# Patient Record
Sex: Female | Born: 1935
Health system: Southern US, Community
[De-identification: ages and names within clinical notes are randomized; demographics above are authoritative.]

## PROBLEM LIST (undated history)

## (undated) DIAGNOSIS — E079 Disorder of thyroid, unspecified: Secondary | ICD-10-CM

## (undated) DIAGNOSIS — I10 Essential (primary) hypertension: Secondary | ICD-10-CM

## (undated) DIAGNOSIS — I4891 Unspecified atrial fibrillation: Secondary | ICD-10-CM

## (undated) DIAGNOSIS — M1990 Unspecified osteoarthritis, unspecified site: Secondary | ICD-10-CM

## (undated) DIAGNOSIS — F0280 Dementia in other diseases classified elsewhere without behavioral disturbance: Secondary | ICD-10-CM

## (undated) DIAGNOSIS — G459 Transient cerebral ischemic attack, unspecified: Secondary | ICD-10-CM

## (undated) HISTORY — PX: EYE SURGERY: SHX253

## (undated) HISTORY — DX: Essential (primary) hypertension: I10

## (undated) HISTORY — DX: Transient cerebral ischemic attack, unspecified: G45.9

## (undated) HISTORY — DX: Disorder of thyroid, unspecified: E07.9

## (undated) HISTORY — DX: Unspecified osteoarthritis, unspecified site: M19.90

## (undated) HISTORY — DX: Unspecified atrial fibrillation: I48.91

---

## 1990-06-21 ENCOUNTER — Encounter: Payer: Self-pay | Admitting: Family Medicine

## 1990-06-27 ENCOUNTER — Encounter: Payer: Self-pay | Admitting: Family Medicine

## 1998-04-30 ENCOUNTER — Other Ambulatory Visit: Admission: RE | Admit: 1998-04-30 | Discharge: 1998-04-30 | Payer: Self-pay | Admitting: Obstetrics & Gynecology

## 1998-10-15 ENCOUNTER — Emergency Department (HOSPITAL_COMMUNITY): Admission: EM | Admit: 1998-10-15 | Discharge: 1998-10-15 | Payer: Self-pay | Admitting: Emergency Medicine

## 1999-04-27 ENCOUNTER — Other Ambulatory Visit: Admission: RE | Admit: 1999-04-27 | Discharge: 1999-04-27 | Payer: Self-pay | Admitting: Obstetrics & Gynecology

## 2000-06-08 ENCOUNTER — Other Ambulatory Visit: Admission: RE | Admit: 2000-06-08 | Discharge: 2000-06-08 | Payer: Self-pay | Admitting: Obstetrics & Gynecology

## 2001-04-25 ENCOUNTER — Encounter (INDEPENDENT_AMBULATORY_CARE_PROVIDER_SITE_OTHER): Payer: Self-pay | Admitting: Specialist

## 2001-04-25 ENCOUNTER — Other Ambulatory Visit: Admission: RE | Admit: 2001-04-25 | Discharge: 2001-04-25 | Payer: Self-pay | Admitting: Obstetrics & Gynecology

## 2001-07-18 ENCOUNTER — Other Ambulatory Visit: Admission: RE | Admit: 2001-07-18 | Discharge: 2001-07-18 | Payer: Self-pay | Admitting: Obstetrics & Gynecology

## 2002-09-04 ENCOUNTER — Encounter: Payer: Self-pay | Admitting: Family Medicine

## 2002-09-04 ENCOUNTER — Encounter: Admission: RE | Admit: 2002-09-04 | Discharge: 2002-09-04 | Payer: Self-pay | Admitting: Family Medicine

## 2002-10-02 ENCOUNTER — Other Ambulatory Visit: Admission: RE | Admit: 2002-10-02 | Discharge: 2002-10-02 | Payer: Self-pay | Admitting: Obstetrics & Gynecology

## 2003-10-30 ENCOUNTER — Other Ambulatory Visit: Admission: RE | Admit: 2003-10-30 | Discharge: 2003-10-30 | Payer: Self-pay | Admitting: Obstetrics & Gynecology

## 2004-10-20 ENCOUNTER — Ambulatory Visit: Payer: Self-pay | Admitting: Family Medicine

## 2004-11-22 ENCOUNTER — Other Ambulatory Visit: Admission: RE | Admit: 2004-11-22 | Discharge: 2004-11-22 | Payer: Self-pay | Admitting: Obstetrics & Gynecology

## 2004-12-06 ENCOUNTER — Ambulatory Visit: Payer: Self-pay | Admitting: Family Medicine

## 2005-10-24 ENCOUNTER — Ambulatory Visit: Payer: Self-pay | Admitting: Family Medicine

## 2005-10-31 ENCOUNTER — Encounter: Payer: Self-pay | Admitting: Internal Medicine

## 2005-10-31 ENCOUNTER — Ambulatory Visit: Payer: Self-pay

## 2005-11-03 ENCOUNTER — Ambulatory Visit: Payer: Self-pay | Admitting: Family Medicine

## 2005-11-08 ENCOUNTER — Ambulatory Visit: Payer: Self-pay | Admitting: Family Medicine

## 2006-04-03 ENCOUNTER — Encounter: Admission: RE | Admit: 2006-04-03 | Discharge: 2006-04-03 | Payer: Self-pay | Admitting: Family Medicine

## 2006-04-03 ENCOUNTER — Ambulatory Visit: Payer: Self-pay | Admitting: Family Medicine

## 2006-04-04 ENCOUNTER — Ambulatory Visit: Payer: Self-pay

## 2006-04-24 ENCOUNTER — Ambulatory Visit: Payer: Self-pay | Admitting: Family Medicine

## 2006-05-06 ENCOUNTER — Ambulatory Visit: Payer: Self-pay | Admitting: Family Medicine

## 2006-11-08 ENCOUNTER — Ambulatory Visit: Payer: Self-pay | Admitting: Family Medicine

## 2006-11-15 ENCOUNTER — Ambulatory Visit: Payer: Self-pay | Admitting: Family Medicine

## 2006-11-15 LAB — CONVERTED CEMR LAB
ALT: 13 units/L (ref 0–40)
AST: 20 units/L (ref 0–37)
Albumin: 3.6 g/dL (ref 3.5–5.2)
Alkaline Phosphatase: 76 units/L (ref 39–117)
BUN: 10 mg/dL (ref 6–23)
Bilirubin, Direct: 0.1 mg/dL (ref 0.0–0.3)
CO2: 26 meq/L (ref 19–32)
Calcium: 9 mg/dL (ref 8.4–10.5)
Chloride: 105 meq/L (ref 96–112)
Creatinine, Ser: 0.9 mg/dL (ref 0.4–1.2)
Free T4: 0.8 ng/dL (ref 0.6–1.6)
GFR calc Af Amer: 79 mL/min
GFR calc non Af Amer: 65 mL/min
Glucose, Bld: 97 mg/dL (ref 70–99)
Potassium: 4.1 meq/L (ref 3.5–5.1)
Sodium: 138 meq/L (ref 135–145)
T3, Free: 2.7 pg/mL (ref 2.3–4.2)
TSH: 0.83 microintl units/mL (ref 0.35–5.50)
Total Bilirubin: 0.6 mg/dL (ref 0.3–1.2)
Total Protein: 6.8 g/dL (ref 6.0–8.3)

## 2007-01-22 ENCOUNTER — Ambulatory Visit: Payer: Self-pay | Admitting: Gastroenterology

## 2007-01-30 DIAGNOSIS — E049 Nontoxic goiter, unspecified: Secondary | ICD-10-CM | POA: Insufficient documentation

## 2007-01-30 DIAGNOSIS — Z9089 Acquired absence of other organs: Secondary | ICD-10-CM | POA: Insufficient documentation

## 2007-01-30 DIAGNOSIS — N840 Polyp of corpus uteri: Secondary | ICD-10-CM | POA: Insufficient documentation

## 2007-01-30 DIAGNOSIS — I10 Essential (primary) hypertension: Secondary | ICD-10-CM | POA: Insufficient documentation

## 2007-01-31 ENCOUNTER — Encounter (INDEPENDENT_AMBULATORY_CARE_PROVIDER_SITE_OTHER): Payer: Self-pay | Admitting: Specialist

## 2007-01-31 ENCOUNTER — Ambulatory Visit: Payer: Self-pay | Admitting: Gastroenterology

## 2007-02-21 ENCOUNTER — Ambulatory Visit: Payer: Self-pay | Admitting: Family Medicine

## 2007-02-21 LAB — CONVERTED CEMR LAB
ALT: 15 units/L (ref 0–40)
AST: 20 units/L (ref 0–37)
Albumin: 3.6 g/dL (ref 3.5–5.2)
Alkaline Phosphatase: 86 units/L (ref 39–117)
BUN: 9 mg/dL (ref 6–23)
Basophils Absolute: 0 10*3/uL (ref 0.0–0.1)
Basophils Relative: 0.1 % (ref 0.0–1.0)
Bilirubin, Direct: 0.1 mg/dL (ref 0.0–0.3)
CO2: 29 meq/L (ref 19–32)
Calcium: 8.9 mg/dL (ref 8.4–10.5)
Chloride: 109 meq/L (ref 96–112)
Cholesterol: 170 mg/dL (ref 0–200)
Creatinine, Ser: 0.8 mg/dL (ref 0.4–1.2)
Eosinophils Absolute: 0.1 10*3/uL (ref 0.0–0.6)
Eosinophils Relative: 1.7 % (ref 0.0–5.0)
GFR calc Af Amer: 91 mL/min
GFR calc non Af Amer: 75 mL/min
Glucose, Bld: 96 mg/dL (ref 70–99)
HCT: 40.6 % (ref 36.0–46.0)
HDL: 45 mg/dL (ref >39.0)
Hemoglobin: 14.1 g/dL (ref 12.0–15.0)
LDL Cholesterol: 86 mg/dL (ref 0–99)
Lymphocytes Relative: 21.9 % (ref 12.0–46.0)
MCHC: 34.7 g/dL (ref 30.0–36.0)
MCV: 94.5 fL (ref 78.0–100.0)
Monocytes Absolute: 0.6 10*3/uL (ref 0.2–0.7)
Monocytes Relative: 10.1 % (ref 3.0–11.0)
Neutro Abs: 3.8 10*3/uL (ref 1.4–7.7)
Neutrophils Relative %: 66.2 % (ref 43.0–77.0)
Platelets: 249 10*3/uL (ref 150–400)
Potassium: 4.5 meq/L (ref 3.5–5.1)
RBC: 4.29 M/uL (ref 3.87–5.11)
RDW: 12.7 % (ref 11.5–14.6)
Sodium: 142 meq/L (ref 135–145)
TSH: 0.55 microintl units/mL (ref 0.35–5.50)
Total Bilirubin: 0.6 mg/dL (ref 0.3–1.2)
Total CHOL/HDL Ratio: 3.8
Total Protein: 6.4 g/dL (ref 6.0–8.3)
Triglycerides: 193 mg/dL — ABNORMAL HIGH (ref 0–149)
VLDL: 39 mg/dL (ref 0–40)
WBC: 5.8 10*3/uL (ref 4.5–10.5)

## 2007-12-14 ENCOUNTER — Ambulatory Visit: Payer: Self-pay | Admitting: Family Medicine

## 2007-12-14 DIAGNOSIS — E039 Hypothyroidism, unspecified: Secondary | ICD-10-CM | POA: Insufficient documentation

## 2007-12-14 DIAGNOSIS — I10 Essential (primary) hypertension: Secondary | ICD-10-CM | POA: Insufficient documentation

## 2007-12-14 DIAGNOSIS — Z8719 Personal history of other diseases of the digestive system: Secondary | ICD-10-CM | POA: Insufficient documentation

## 2008-01-01 ENCOUNTER — Ambulatory Visit: Payer: Self-pay | Admitting: Family Medicine

## 2008-01-01 LAB — CONVERTED CEMR LAB: OCCULT 3: NEGATIVE

## 2008-01-02 ENCOUNTER — Encounter (INDEPENDENT_AMBULATORY_CARE_PROVIDER_SITE_OTHER): Payer: Self-pay | Admitting: *Deleted

## 2008-03-31 ENCOUNTER — Ambulatory Visit: Payer: Self-pay | Admitting: Family Medicine

## 2008-03-31 ENCOUNTER — Encounter (INDEPENDENT_AMBULATORY_CARE_PROVIDER_SITE_OTHER): Payer: Self-pay | Admitting: *Deleted

## 2008-03-31 DIAGNOSIS — E874 Mixed disorder of acid-base balance: Secondary | ICD-10-CM | POA: Insufficient documentation

## 2008-03-31 LAB — CONVERTED CEMR LAB
ALT: 14 units/L (ref 0–35)
AST: 21 units/L (ref 0–37)
Bilirubin, Direct: 0.1 mg/dL (ref 0.0–0.3)
Cholesterol: 161 mg/dL (ref 0–200)
HDL: 43.9 mg/dL (ref >39.0)
Total Protein: 6.8 g/dL (ref 6.0–8.3)
VLDL: 22 mg/dL (ref 0–40)

## 2008-11-19 ENCOUNTER — Ambulatory Visit: Payer: Self-pay | Admitting: Family Medicine

## 2008-11-20 ENCOUNTER — Encounter: Payer: Self-pay | Admitting: Family Medicine

## 2008-11-26 ENCOUNTER — Encounter (INDEPENDENT_AMBULATORY_CARE_PROVIDER_SITE_OTHER): Payer: Self-pay | Admitting: *Deleted

## 2008-11-26 ENCOUNTER — Telehealth (INDEPENDENT_AMBULATORY_CARE_PROVIDER_SITE_OTHER): Payer: Self-pay | Admitting: *Deleted

## 2009-05-29 ENCOUNTER — Encounter: Payer: Self-pay | Admitting: Family Medicine

## 2009-07-20 ENCOUNTER — Ambulatory Visit: Payer: Self-pay | Admitting: Family Medicine

## 2009-07-20 DIAGNOSIS — R413 Other amnesia: Secondary | ICD-10-CM | POA: Insufficient documentation

## 2009-07-20 DIAGNOSIS — R55 Syncope and collapse: Secondary | ICD-10-CM | POA: Insufficient documentation

## 2009-07-20 DIAGNOSIS — R42 Dizziness and giddiness: Secondary | ICD-10-CM | POA: Insufficient documentation

## 2009-07-22 ENCOUNTER — Encounter: Payer: Self-pay | Admitting: Family Medicine

## 2009-07-22 ENCOUNTER — Encounter: Admission: RE | Admit: 2009-07-22 | Discharge: 2009-07-22 | Payer: Self-pay | Admitting: Emergency Medicine

## 2009-07-24 ENCOUNTER — Ambulatory Visit: Payer: Self-pay | Admitting: Family Medicine

## 2009-07-24 ENCOUNTER — Ambulatory Visit: Payer: Self-pay

## 2009-07-24 DIAGNOSIS — D599 Acquired hemolytic anemia, unspecified: Secondary | ICD-10-CM | POA: Insufficient documentation

## 2009-07-24 DIAGNOSIS — E538 Deficiency of other specified B group vitamins: Secondary | ICD-10-CM | POA: Insufficient documentation

## 2009-08-03 ENCOUNTER — Ambulatory Visit: Payer: Self-pay | Admitting: Family Medicine

## 2009-08-03 DIAGNOSIS — Z8679 Personal history of other diseases of the circulatory system: Secondary | ICD-10-CM | POA: Insufficient documentation

## 2009-08-06 ENCOUNTER — Telehealth: Payer: Self-pay | Admitting: Family Medicine

## 2009-08-10 ENCOUNTER — Ambulatory Visit: Payer: Self-pay | Admitting: Family Medicine

## 2009-08-17 ENCOUNTER — Encounter: Payer: Self-pay | Admitting: Family Medicine

## 2009-08-18 ENCOUNTER — Ambulatory Visit: Payer: Self-pay | Admitting: Family Medicine

## 2009-08-25 ENCOUNTER — Ambulatory Visit: Payer: Self-pay | Admitting: Family Medicine

## 2009-08-31 ENCOUNTER — Ambulatory Visit: Payer: Self-pay | Admitting: Family Medicine

## 2009-09-28 ENCOUNTER — Ambulatory Visit: Payer: Self-pay | Admitting: Family Medicine

## 2009-11-24 ENCOUNTER — Ambulatory Visit: Payer: Self-pay | Admitting: Family Medicine

## 2009-11-24 DIAGNOSIS — Z87448 Personal history of other diseases of urinary system: Secondary | ICD-10-CM | POA: Insufficient documentation

## 2009-11-24 LAB — CONVERTED CEMR LAB
Bilirubin Urine: NEGATIVE
Ketones, urine, test strip: NEGATIVE
Specific Gravity, Urine: 1.02
pH: 6.5

## 2009-12-28 ENCOUNTER — Ambulatory Visit: Payer: Self-pay | Admitting: Family Medicine

## 2010-01-21 ENCOUNTER — Ambulatory Visit: Payer: Self-pay | Admitting: Family Medicine

## 2010-02-12 ENCOUNTER — Encounter (INDEPENDENT_AMBULATORY_CARE_PROVIDER_SITE_OTHER): Payer: Self-pay | Admitting: *Deleted

## 2010-02-12 ENCOUNTER — Ambulatory Visit: Payer: Self-pay | Admitting: Gastroenterology

## 2010-02-12 DIAGNOSIS — R197 Diarrhea, unspecified: Secondary | ICD-10-CM | POA: Insufficient documentation

## 2010-02-12 LAB — CONVERTED CEMR LAB
ALT: 15 units/L (ref 0–35)
AST: 20 units/L (ref 0–37)
BUN: 8 mg/dL (ref 6–23)
Basophils Relative: 0.4 % (ref 0.0–3.0)
CO2: 30 meq/L (ref 19–32)
Chloride: 105 meq/L (ref 96–112)
Eosinophils Absolute: 0.1 10*3/uL (ref 0.0–0.7)
Eosinophils Relative: 1.4 % (ref 0.0–5.0)
Glucose, Bld: 100 mg/dL — ABNORMAL HIGH (ref 70–99)
Hemoglobin: 13.6 g/dL (ref 12.0–15.0)
Iron: 62 ug/dL (ref 42–145)
Lymphocytes Relative: 19.8 % (ref 12.0–46.0)
Monocytes Relative: 9.4 % (ref 3.0–12.0)
Neutrophils Relative %: 69 % (ref 43.0–77.0)
Potassium: 4.3 meq/L (ref 3.5–5.1)
RBC: 4.07 M/uL (ref 3.87–5.11)
Sed Rate: 16 mm/hr (ref 0–22)
TSH: 0.65 microintl units/mL (ref 0.35–5.50)
Total Bilirubin: 0.4 mg/dL (ref 0.3–1.2)
Total Protein: 6.6 g/dL (ref 6.0–8.3)
Transferrin: 242.2 mg/dL (ref 212.0–360.0)
WBC: 6.1 10*3/uL (ref 4.5–10.5)

## 2010-02-19 ENCOUNTER — Ambulatory Visit: Payer: Self-pay | Admitting: Gastroenterology

## 2010-02-22 LAB — CONVERTED CEMR LAB: Tissue Transglutaminase Ab, IgA: 2.2 units (ref <20)

## 2010-02-23 ENCOUNTER — Ambulatory Visit: Payer: Self-pay | Admitting: Family Medicine

## 2010-02-23 ENCOUNTER — Encounter: Payer: Self-pay | Admitting: Gastroenterology

## 2010-03-22 ENCOUNTER — Ambulatory Visit: Payer: Self-pay | Admitting: Family Medicine

## 2010-04-15 ENCOUNTER — Ambulatory Visit: Payer: Self-pay | Admitting: Family Medicine

## 2010-05-20 ENCOUNTER — Ambulatory Visit: Payer: Self-pay | Admitting: Family Medicine

## 2010-06-25 ENCOUNTER — Ambulatory Visit: Payer: Self-pay | Admitting: Family Medicine

## 2010-07-05 ENCOUNTER — Telehealth (INDEPENDENT_AMBULATORY_CARE_PROVIDER_SITE_OTHER): Payer: Self-pay | Admitting: *Deleted

## 2010-07-06 ENCOUNTER — Ambulatory Visit: Payer: Self-pay | Admitting: Family Medicine

## 2010-07-06 DIAGNOSIS — Z888 Allergy status to other drugs, medicaments and biological substances status: Secondary | ICD-10-CM | POA: Insufficient documentation

## 2010-07-06 DIAGNOSIS — R609 Edema, unspecified: Secondary | ICD-10-CM | POA: Insufficient documentation

## 2010-11-07 ENCOUNTER — Encounter: Payer: Self-pay | Admitting: Family Medicine

## 2010-11-14 LAB — CONVERTED CEMR LAB
ALT: 12 units/L (ref 0–35)
ALT: 16 units/L (ref 0–35)
AST: 20 units/L (ref 0–37)
AST: 23 units/L (ref 0–37)
Albumin: 3.6 g/dL (ref 3.5–5.2)
Albumin: 3.7 g/dL (ref 3.5–5.2)
Albumin: 3.9 g/dL (ref 3.5–5.2)
BUN: 13 mg/dL (ref 6–23)
BUN: 14 mg/dL (ref 6–23)
BUN: 8 mg/dL (ref 6–23)
Basophils Absolute: 0 10*3/uL (ref 0.0–0.1)
Basophils Absolute: 0 10*3/uL (ref 0.0–0.1)
Basophils Absolute: 0 10*3/uL (ref 0.0–0.1)
Basophils Relative: 0.7 % (ref 0.0–3.0)
Bilirubin Urine: NEGATIVE
Blood in Urine, dipstick: NEGATIVE
CO2: 28 meq/L (ref 19–32)
CO2: 28 meq/L (ref 19–32)
Calcium: 9 mg/dL (ref 8.4–10.5)
Calcium: 9.2 mg/dL (ref 8.4–10.5)
Calcium: 9.4 mg/dL (ref 8.4–10.5)
Chloride: 106 meq/L (ref 96–112)
Chloride: 108 meq/L (ref 96–112)
Cholesterol: 198 mg/dL (ref 0–200)
Creatinine, Ser: 0.9 mg/dL (ref 0.4–1.2)
Eosinophils Absolute: 0.1 10*3/uL (ref 0.0–0.6)
Eosinophils Absolute: 0.1 10*3/uL (ref 0.0–0.7)
Eosinophils Relative: 1.4 % (ref 0.0–5.0)
Eosinophils Relative: 1.5 % (ref 0.0–5.0)
GFR calc non Af Amer: 64.84 mL/min (ref >60)
GFR calc non Af Amer: 65 mL/min
Glucose, Bld: 86 mg/dL (ref 70–99)
Glucose, Bld: 90 mg/dL (ref 70–99)
Glucose, Urine, Semiquant: NEGATIVE
Glucose, Urine, Semiquant: NEGATIVE
HCT: 41.4 % (ref 36.0–46.0)
Hemoglobin: 13.9 g/dL (ref 12.0–15.0)
Hemoglobin: 14.4 g/dL (ref 12.0–15.0)
Ketones, urine, test strip: NEGATIVE
LDL Cholesterol: 114 mg/dL — ABNORMAL HIGH (ref 0–99)
Lymphocytes Relative: 19.1 % (ref 12.0–46.0)
Lymphocytes Relative: 33.1 % (ref 12.0–46.0)
Lymphs Abs: 1.8 10*3/uL (ref 0.7–4.0)
MCHC: 32.8 g/dL (ref 30.0–36.0)
MCHC: 33.6 g/dL (ref 30.0–36.0)
MCHC: 33.9 g/dL (ref 30.0–36.0)
MCV: 96.8 fL (ref 78.0–100.0)
Monocytes Relative: 11.8 % — ABNORMAL HIGH (ref 3.0–11.0)
Monocytes Relative: 3.7 % (ref 3.0–12.0)
Neutro Abs: 3.3 10*3/uL (ref 1.4–7.7)
Neutro Abs: 4 10*3/uL (ref 1.4–7.7)
Nitrite: NEGATIVE
Nitrite: POSITIVE
Pap Smear: NORMAL
Pap Smear: NORMAL
Platelets: 204 10*3/uL (ref 150.0–400.0)
Platelets: 274 10*3/uL (ref 150–400)
Protein, U semiquant: NEGATIVE
RBC: 4.36 M/uL (ref 3.87–5.11)
RBC: 4.38 M/uL (ref 3.87–5.11)
RDW: 12.3 % (ref 11.5–14.6)
Sodium: 141 meq/L (ref 135–145)
Specific Gravity, Urine: 1.015
Specific Gravity, Urine: 1.025
TSH: 0.63 microintl units/mL (ref 0.35–5.50)
TSH: 1.36 microintl units/mL (ref 0.35–5.50)
Total Bilirubin: 0.4 mg/dL (ref 0.3–1.2)
Total Bilirubin: 0.5 mg/dL (ref 0.3–1.2)
Total CHOL/HDL Ratio: 4.1
Total Protein: 6.6 g/dL (ref 6.0–8.3)
Triglycerides: 158 mg/dL — ABNORMAL HIGH (ref 0–149)
Urobilinogen, UA: NEGATIVE
WBC Urine, dipstick: NEGATIVE
WBC Urine, dipstick: NEGATIVE
WBC: 5.9 10*3/uL (ref 4.5–10.5)
WBC: 6 10*3/uL (ref 4.5–10.5)
pH: 5
pH: 5

## 2010-11-18 NOTE — Assessment & Plan Note (Signed)
Summary: ALLERGIC REACTION?CDJ   Vital Signs:  Patient profile:   77 year old female Height:      66 inches Weight:      164.8 pounds Temp:     98.0 degrees F oral Pulse rate:   80 / minute Pulse rhythm:   regular BP sitting:   132 / 74  (left arm) Cuff size:   regular  Vitals Entered By: Almeta Monas CMA Duncan Dull) (July 06, 2010 10:58 AM) CC: c/o itching after B-12 inj ? allergic reaction   History of Present Illness: Pt here c/o itching since last b12 shot.  No rash.   pt also c.o swelling R foot when she is on her feet a lot.  Current Medications (verified): 1)  Cozaar 100 Mg Tabs (Losartan Potassium) .Marland Kitchen.. 1 By Mouth Once Daily 2)  Synthroid 100 Mcg  Tabs (Levothyroxine Sodium) .... Take One Tablet Daily 3)  Aspirin 325 Mg Tabs (Aspirin) .... Take 1 Tablet By Mouth Once A Day 4)  Cyanocobalamin 1000 Mcg/ml Soln (Cyanocobalamin) .... Inject 1 Ml Im Once Monthly 5)  Culturelle Digestive Health  Caps (Lactobacillus-Inulin) .... Take 1 Capsule By Mouth Once A Day 6)  Lactaid 3000 Unit Tabs (Lactase) .... As Needed  Allergies (verified): No Known Drug Allergies  Past History:  Past medical, surgical, family and social histories (including risk factors) reviewed for relevance to current acute and chronic problems.  Past Medical History: Reviewed history from 02/12/2010 and no changes required. Goiter Hypertension Diverticulitis, hx of Hyperlipidemia Stroke "mini" 07/2009  Past Surgical History: Reviewed history from 02/12/2010 and no changes required. Cholecystectomy thyroidectomy, partial uterine polyp Cataract Extraction-bilateral w/lens implants  Family History: Reviewed history from 12/14/2007 and no changes required. Family History of CAD Female 1st degree relative <50 Family History Diabetes 1st degree relative dibetes insipidus M--MVI  Social History: Reviewed history from 02/12/2010 and no changes required. 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 Alert and cooperative with normal attention span and concentration. No apparent delusions, illusions, hallucinations   Impression & Recommendations:  Problem # 1:  OTHER DRUG ALLERGY (ICD-995.27) b12 level may have been high check labs  Problem # 2:  LEG EDEMA, RIGHT (ICD-782.3) pt refused diuretic Discussed elevation of the legs, use of compression stockings, sodium restiction, and medication use.   Complete Medication List: 1)  Cozaar 100 Mg Tabs (Losartan potassium) .Marland Kitchen.. 1 by mouth once daily 2)  Synthroid 100 Mcg Tabs (Levothyroxine sodium) .... Take one tablet daily 3)  Aspirin 325 Mg Tabs (Aspirin) .... Take 1 tablet by mouth once a day 4)  Cyanocobalamin 1000 Mcg/ml Soln (Cyanocobalamin) .... Inject 1 ml im once monthly 5)  Culturelle Digestive Health Caps (Lactobacillus-inulin) .... Take 1 capsule by mouth once a day 6)  Lactaid 3000 Unit Tabs (Lactase) .... As needed  Other Orders: Venipuncture (21308) TLB-B12 + Folate Pnl (82746_82607-B12/FOL) Flu Vaccine 78yrs + MEDICARE PATIENTS (M5784) Administration Flu vaccine - MCR (O9629) Flu Vaccine Consent Questions     Do you have a history of severe allergic reactions to this vaccine? no    Any prior history of allergic reactions to egg and/or gelatin? no  Do you have a sensitivity to the preservative Thimersol? no    Do you have a past history of Guillan-Barre Syndrome? no    Do you currently have an acute febrile illness? no    Have you ever had a severe reaction to latex? no    Vaccine information given and  explained to patient? yes    Are you currently pregnant? no    Lot Number:[id number]   Exp Date:04/16/2011   Site Given  Left Deltoid IM.lbmedflu

## 2010-11-18 NOTE — Assessment & Plan Note (Signed)
Summary: b12 shot/kdc   Nurse Visit   Allergies: No Known Drug Allergies  Medication Administration  Injection # 1:    Medication: Vit B12 1000 mcg    Diagnosis: B12 DEFICIENCY (ICD-266.2)    Route: IM    Site: L deltoid    Exp Date: 11/2011    Lot #: [id number]    Mfr: American Regent    Patient tolerated injection without complications    Given by: Army Fossa CMA (January 21, 2010 8:22 AM)  Orders Added: 1)  Admin of Therapeutic Inj  intramuscular or subcutaneous [96372] 2)  Vit B12 1000 mcg [J3420]

## 2010-11-18 NOTE — Assessment & Plan Note (Signed)
Summary: b-12/cbs      Allergies Added: NKDA Nurse Visit   Current Medications (verified): 1)  Cozaar 100 Mg Tabs (Losartan Potassium) .Marland Kitchen.. 1 By Mouth Once Daily 2)  Synthroid 100 Mcg  Tabs (Levothyroxine Sodium) .... Take One Tablet Daily 3)  Aspirin 325 Mg Tabs (Aspirin) .... Take 1 Tablet By Mouth Once A Day 4)  Cyanocobalamin 1000 Mcg/ml Soln (Cyanocobalamin) .... Inject 1 Ml Im Once Monthly 5)  Culturelle Digestive Health  Caps (Lactobacillus-Inulin) .... Take 1 Capsule By Mouth Once A Day  Allergies (verified): No Known Drug Allergies  Medication Administration  Injection # 1:    Medication: Vit B12 1000 mcg    Diagnosis: B12 DEFICIENCY (ICD-266.2)    Route: IM    Site: L deltoid    Exp Date: 12/16/2011    Lot #: [id number]    Mfr: American Regent    Patient tolerated injection without complications    Given by: Jeremy Johann CMA (May 20, 2010 9:38 AM)  Orders Added: 1)  Admin of Therapeutic Inj  intramuscular or subcutaneous [96372] 2)  Vit B12 1000 mcg [J3420]

## 2010-11-18 NOTE — Progress Notes (Signed)
Summary: itching and redness   Phone Note Call from Patient Call back at Home Phone [phone]   Caller: Patient Summary of Call: patient says she has been having some itching around stomach and breast and notices when she scratches she gets red raised areas feeling better today informed pt to take benadryl prn but wants to know what she should do as far as getting next shot  pls advise Initial call taken by: Doristine Devoid CMA,  July 05, 2010 4:27 PM  Follow-up for Phone Call        It should not be from shot---I should see rash to see if we can tell if it is fungal or reaction to shot Follow-up by: Loreen Freud DO,  July 05, 2010 4:37 PM  Additional Follow-up for Phone Call Additional follow up Details #1::        spoke w/ patient appt scheduled....Marland KitchenMarland KitchenDoristine Devoid CMA  July 05, 2010 4:59 PM

## 2010-11-18 NOTE — Procedures (Signed)
Summary: Colon and Path   Colonoscopy  Procedure date:  01/31/2007  Findings:      Location:  [facility].   Patient Name: Belinda Anderson, Belinda Anderson MRN:  Procedure Procedures: Colonoscopy CPT: [phone].  Personnel: Endoscopist: Vania Rea. Jarold Motto, MD.  Exam Location: Exam performed in Outpatient Clinic. Outpatient  Patient Consent: Procedure, Alternatives, Risks and Benefits discussed, consent obtained, from patient. Consent was obtained by the RN.  Indications  Average Risk Screening Routine.  History  Current Medications: Patient is not currently taking Coumadin.  Medical/ Surgical History: Hypertension, Hyperlipidemia, Hypothyroidism,  Pre-Exam Physical: Performed Jan 31, 2007. Cardio-pulmonary exam, Rectal exam, Abdominal exam, Extremity exam, Mental status exam WNL.  Comments: Pt. history reviewed/updated, physical exam performed prior to initiation of sedation? yes Exam Exam: Extent of exam reached: Cecum, extent intended: Cecum.  The cecum was identified by appendiceal orifice and IC valve. Patient position: on left side. Time to Cecum: 00:06:09. Time for Withdrawl: 00:08:23. Colon retroflexion performed. Images taken. ASA Classification: II. Tolerance: excellent.  Monitoring: Pulse and BP monitoring, Oximetry used. Supplemental O2 given. at 2 Liters.  Colon Prep Used Golytely for colon prep. Prep results: fair, adequate exam.  Sedation Meds: Patient assessed and found to be appropriate for moderate (conscious) sedation. Fentanyl 75 mcg. given IV. Versed 8 mg. given IV.  Instrument(s): CF 140L. Serial [id number].  Findings - DIVERTICULOSIS: Descending Colon to Sigmoid Colon. Not bleeding. ICD9: Diverticulosis, Colon: 562.10.  - NORMAL EXAM: Cecum to Rectum. Not Seen: Polyps. AVM's. Colitis. Tumors. Melanosis. Crohn's.  - OTHER FINDING: Rectum. Comments: Nodular granular perianal papillae biopsied...   Assessment  Diagnoses: 562.10:  Diverticulosis, Colon.   Comments: R/O squamous atypia in rectal papllae...  Events  Unplanned Interventions: No intervention was required.  Plans Medication Plan: Await pathology. Referring provider to order medications.  Patient Education: Patient given standard instructions for: Diverticulosis.  Disposition: After procedure patient sent to recovery. After recovery patient sent home.  Scheduling/Referral: Follow-Up prn. Await pathology to schedule patient.    cc: Freddy Finner, M.D.     Loreen Freud DO      [facility] Surgical Pathology - STATUS: Final             By: Threasa Beards  ,        Perform Date: 71Apr08 00:01  Ordered By: Talbert Forest Date: 16Apr08 15:01  Facility: [facility]                               Department: CPATH  Service Report Text  [facility]   [address] [address]   [place], [place] [address]   Telephone [phone] or [phone] Fax [phone]    REPORT OF SURGICAL PATHOLOGY    Case #: [id number]   Patient Name: Belinda Anderson, Belinda Anderson.   Office Chart Number: N/A    MRN: [id number]   Pathologist: Havery Moros, MD   DOB/Age 06/15/1932 (Age: 77) Gender: F   Date Taken: 01/31/2007   Date Received: 01/31/2007    FINAL DIAGNOSIS    ***MICROSCOPIC EXAMINATION AND DIAGNOSIS***    RECTUM, BIOPSY:   - HYPERPLASTIC RECTAL MUCOSA WITH ULCERATION AND   INFLAMMATION, SEE COMMENT.    COMMENT   The sections show fragments of benign rectal mucosa displaying   hyperplastic epithelial changes. This is associated with   ulceration and inflammation. Some of the  rectal fragments also   show extension of smooth muscle into the lamina propria. A small   focus of benign appearing squamous epithelium is present. The   findings likely represent mucosal prolapse/solitary rectal ulcer.   There is no evidence of malignancy. (BNS:caf 02/01/07)    cf   Date Reported: 02/01/2007 Havery Moros, MD   *** Electronically  Signed Out By BNS ***    Clinical information   Screening, abnormal tissue; R/O squamous cell (caf)    specimen(s) obtained   Rectum, biopsy    Gross Description   Received in formalin are tan, soft tissue fragments that are   submitted in toto. Number: 4   Size: 0.2 to 0.5 cm (TB:kv 01-31-07)    kv/    This report was created from the original endoscopy report, which was reviewed and signed by the above listed endoscopist.

## 2010-11-18 NOTE — Assessment & Plan Note (Signed)
Summary: FOLLOW UP/RH......Marland Kitchen   Vital Signs:  Patient profile:   77 year old female Height:      66 inches Weight:      166.8 pounds BMI:     27.02 Temp:     97.1 degrees F Pulse rate:   90 / minute BP sitting:   148 / 86  Vitals Entered By: Dena Billet CC: 60mo f/u, Dysuria   History of Present Illness:  Hypertension follow-up      This is a 77 year old woman who presents for Hypertension follow-up.  The patient complains of urinary frequency, but denies lightheadedness, headaches, edema, impotence, rash, and fatigue.  The patient denies the following associated symptoms: chest pain, chest pressure, exercise intolerance, dyspnea, palpitations, syncope, leg edema, and pedal edema.  Compliance with medications (by patient report) has been near 100%.  The patient reports that dietary compliance has been good.  The patient reports exercising occasionally.  Adjunctive measures currently used by the patient include salt restriction.    Dysuria      The patient also presents with Dysuria.  The symptoms began 1 day ago.  The patient complains of urinary frequency, but denies burning with urination, urgency, hematuria, vaginal discharge, vaginal itching, vaginal sores, and penile discharge.  Associated symptoms include abdominal pain.  The patient denies the following associated symptoms: nausea, vomiting, fever, shaking chills, flank pain, back pain, pelvic pain, and arthralgias.  The patient denies the following risk factors: diabetes, prior antibiotics, immunosuppression, history of GU anomaly, history of pyelonephritis, pregnancy, history of STD, and analgesic abuse.  History is significant for no urinary tract   Pt also c/o going to [facility] for BM much more often and when she eats or drinks it goes right through her and the bms are pencil like.  Pt is also having more gas.  Preventive Screening-Counseling & Management  Alcohol-Tobacco     Smoking Status: never  Caffeine-Diet-Exercise  Caffeine use/day: 1     Does Patient Exercise: no  Current Medications (verified): 1)  Cozaar 100 Mg Tabs (Losartan Potassium) .Marland Kitchen.. 1 By Mouth Once Daily 2)  Synthroid 100 Mcg  Tabs (Levothyroxine Sodium) .... Take One Tablet Daily 3)  Prempro 0.625-5 Mg  Tabs (Conj Estrog-Medroxyprogest Ace) .... Take One Tablet Daily 4)  Zostavax 86578 Unt/0.47ml Solr (Zoster Vaccine Live) .Marland Kitchen.. 1 Ml Im X1 5)  Cipro 500 Mg Tabs (Ciprofloxacin Hcl) .Marland Kitchen.. 1 By Mouth Two Times A Day  Allergies (verified): No Known Drug Allergies  Past History:  Past medical, surgical, family and social histories (including risk factors) reviewed for relevance to current acute and chronic problems.  Past Medical History: Reviewed history from 12/14/2007 and no changes required. Goiter Hypertension Diverticulitis, hx of  Past Surgical History: Reviewed history from 12/14/2007 and no changes required. Cholecystectomy thyroidectomy, partial uterine polyp  Family History: Reviewed history from 12/14/2007 and no changes required. Family History of CAD Female 1st degree relative <50 Family History Diabetes 1st degree relative dibetes insipidus M--MVI  Social History: Reviewed history from 12/14/2007 and no changes required. 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 Lungs:  Normal respiratory effort, chest expands symmetrically. Lungs are clear to auscultation, no crackles or wheezes. Heart:  normal rate and no murmur.  Abdomen:  Bowel sounds positive,abdomen soft and non-tender without masses, organomegaly or hernias noted. Psych:  Oriented X3 and normally interactive.     Impression & Recommendations:  Problem  # 1:  HYPERTENSION (ICD-401.9)  Her updated medication list for this problem includes:    Cozaar 100 Mg Tabs (Losartan potassium) .Marland Kitchen... 1 by mouth once daily  Orders: UA Dipstick w/o Micro (manual) (24401)  Problem # 2:  B12 DEFICIENCY (ICD-266.2)  Orders: Vit B12 1000 mcg (J3420) Admin of Therapeutic Inj  intramuscular or subcutaneous (02725) UA Dipstick w/o Micro (manual) (36644)  Complete Medication List: 1)  Cozaar 100 Mg Tabs (Losartan potassium) .Marland Kitchen.. 1 by mouth once daily 2)  Synthroid 100 Mcg Tabs (Levothyroxine sodium) .... Take one tablet daily 3)  Prempro 0.625-5 Mg Tabs (Conj estrog-medroxyprogest ace) .... Take one tablet daily 4)  Zostavax 03474 Unt/0.36ml Solr (Zoster vaccine live) .Marland Kitchen.. 1 ml im x1 5)  Cipro 500 Mg Tabs (Ciprofloxacin hcl) .Marland Kitchen.. 1 by mouth two times a day  Other Orders: T-Culture, Urine ([id number])  Patient Instructions: 1)  244.9  401.9 268.9  CBCD, BMP, LIPID,HEP, VIT d  TSH Prescriptions: CIPRO 500 MG TABS (CIPROFLOXACIN HCL) 1 by mouth two times a day  #10 x 0   Entered and Authorized by:   Loreen Freud DO   Signed by:   Loreen Freud DO on 11/24/2009   Method used:   Electronically to        [id number] (retail)       [address]       [place], [place]  [address]       Ph: [phone] or [phone]       Fax: [phone]   RxID:   [id number] ZOSTAVAX 19400 UNT/0.65ML SOLR (ZOSTER VACCINE LIVE) 1 ml IM x1  #1 x 0   Entered and Authorized by:   Loreen Freud DO   Signed by:   Loreen Freud DO on 11/24/2009   Method used:   Print then Give to Patient   RxID:   [id number]      Laboratory Results   Urine Tests    Routine Urinalysis   Color: yellow Appearance: Clear Glucose: negative   (Normal Range: Negative) Bilirubin: negative   (Normal Range: Negative) Ketone: negative   (Normal Range: Negative) Spec. Gravity: 1.020   (Normal Range: 1.003-1.035) Blood: trace-lysed    (Normal Range: Negative) pH: 6.5   (Normal Range: 5.0-8.0) Protein: negative   (Normal Range: Negative) Urobilinogen: 0.2   (Normal Range: 0-1) Nitrite: negative   (Normal Range: Negative) Leukocyte Esterace: negative   (Normal Range: Negative)    Comments: Army Fossa CMA  November 24, 2009 10:14 AM       Medication Administration  Injection # 1:    Medication: Vit B12 1000 mcg    Diagnosis: B12 DEFICIENCY (ICD-266.2)    Route: IM    Site: R deltoid    Exp Date: 03/2011    Lot #: [id number]    Mfr: American Regent    Patient tolerated injection without complications    Given by: Army Fossa CMA (November 24, 2009 10:41 AM)  Orders Added: 1)  T-Culture, Urine [[id number]] 2)  Vit B12 1000 mcg [J3420] 3)  Admin of Therapeutic Inj  intramuscular or subcutaneous [96372] 4)  Est. Patient Level IV [29937] 5)  UA Dipstick w/o Micro (manual) [81002]

## 2010-11-18 NOTE — Assessment & Plan Note (Signed)
Summary: b12 inj//lch   Nurse Visit   Allergies: No Known Drug Allergies  Medication Administration  Injection # 1:    Medication: Vit B12 1000 mcg    Diagnosis: B12 DEFICIENCY (ICD-266.2)    Route: IM    Site: R deltoid    Exp Date: 01/2012    Lot #: [id number]    Mfr: app pharmaceuticals    Patient tolerated injection without complications    Given by: Army Fossa CMA (March 22, 2010 9:16 AM)  Orders Added: 1)  Admin of Therapeutic Inj  intramuscular or subcutaneous [96372] 2)  Vit B12 1000 mcg [J3420]

## 2010-11-18 NOTE — Assessment & Plan Note (Signed)
Summary: b-12/cbs   Nurse Visit   Allergies: No Known Drug Allergies  Medication Administration  Injection # 1:    Medication: Vit B12 1000 mcg    Diagnosis: B12 DEFICIENCY (ICD-266.2)    Route: IM    Site: L deltoid    Exp Date: 12/16/2011    Lot #: [id number]    Mfr: American Regent    Patient tolerated injection without complications    Given by: Almeta Monas CMA Duncan Dull) (June 25, 2010 12:52 PM)  Orders Added: 1)  Admin of Therapeutic Inj  intramuscular or subcutaneous [96372] 2)  Vit B12 1000 mcg [J3420]

## 2010-11-18 NOTE — Procedures (Signed)
Summary: Flexible Sigmoidoscopy  Patient: Belinda Anderson Note: All result statuses are Final unless otherwise noted.  Tests: (1) Flexible Sigmoidoscopy (FLX)  FLX Flexible Sigmoidoscopy                             DONE     [facility]     [address]     [place], [place]  [address]           FLEXIBLE SIGMOIDOSCOPY PROCEDURE REPORT           PATIENT:  [name], [name]  MR#:  [id number]     BIRTHDATE:  January 09, 1932, 77 yrs. old  GENDER:  female           ENDOSCOPIST:  Vania Rea. Jarold Motto, MD, [facility]     Referred by:           PROCEDURE DATE:  02/19/2010     PROCEDURE:  Flexible Sigmoidoscopy with biopsy     ASA CLASS:  Class II     INDICATIONS:  unexplained diarrhea           MEDICATIONS:   Fentanyl 50 mcg IV, Versed 5 mg IV           DESCRIPTION OF PROCEDURE:   After the risks benefits and     alternatives of the procedure were thoroughly explained, informed     consent was obtained.  Digital rectal exam was performed and     revealed no rectal masses.   The [id number] endoscope     was introduced through the anus and advanced to the descending     colon, without limitations.  The quality of the prep was poor.     The instrument was then slowly withdrawn as the mucosa was fully     examined.     <<PROCEDUREIMAGES>>           A tubulovillous mass was found in the rectum. see pictures.soft     friable mass biopsied.also random colon biopsies done.     Retroflexion was not performed.  The scope was then withdrawn from     the patient and the procedure terminated.           COMPLICATIONS:  None           ENDOSCOPIC IMPRESSION:     1) Tubulovillous mass in the rectum     1.?? rectal prolapse and inflammatory mass.r/o villous adenoma.           2.r/o collagenous colitis.     RECOMMENDATIONS:     1) await biopsy results           REPEAT EXAM:  No           ______________________________     Vania Rea. Jarold Motto, MD, [facility]           CC:  Varney Baas,  MD           n.     Rosalie Doctor:   Vania Rea. Patterson at 02/19/2010 03:32 PM           Blase Mess, [id number]  Note: An exclamation mark (!) indicates a result that was not dispersed into the flowsheet. Document Creation Date: 02/19/2010 3:32 PM _______________________________________________________________________  (1) Order result status: Final Collection or observation date-time: 02/19/2010 15:19 Requested date-time:  Receipt date-time:  Reported date-time:  Referring Physician:   Ordering Physician: Sheryn Bison [phone]) Specimen Source:  Source: Launa Grill Order  Number: [id number] Lab site:

## 2010-11-18 NOTE — Assessment & Plan Note (Signed)
Summary: b12 shot/kdc   Nurse Visit   Allergies: No Known Drug Allergies  Medication Administration  Injection # 1:    Medication: Vit B12 1000 mcg    Diagnosis: B12 DEFICIENCY (ICD-266.2)    Route: IM    Site: R deltoid    Exp Date: 12/28/2009    Lot #: [id number]    Mfr: American Regent    Patient tolerated injection without complications    Given by: deloach, felicia  Orders Added: 1)  Admin of Therapeutic Inj  intramuscular or subcutaneous [96372] 2)  Vit B12 1000 mcg [J3420]   Medication Administration  Injection # 1:    Medication: Vit B12 1000 mcg    Diagnosis: B12 DEFICIENCY (ICD-266.2)    Route: IM    Site: R deltoid    Exp Date: 12/28/2009    Lot #: [id number]    Mfr: American Regent    Patient tolerated injection without complications    Given by: deloach, felicia  Orders Added: 1)  Admin of Therapeutic Inj  intramuscular or subcutaneous [96372] 2)  Vit B12 1000 mcg [J3420]

## 2010-11-18 NOTE — Letter (Signed)
Summary: Patient Notice- Colon Biospy Results  [facility]  [address] [place], [place] [address]   Phone: [phone]  Fax: [phone]        Feb 23, 2010 MRN: [id number]    Belinda Anderson [address] [place], [place]  [address]    Dear Ms. Belinda Anderson,  I am pleased to inform you that the biopsies taken during your recent colonoscopy did not show any evidence of cancer upon pathologic examination.  Additional information/recommendations:  __No further action is needed at this time.  Please follow-up with      your primary care physician for your other healthcare needs.  __Please call [phone] to schedule a return visit to review      your condition.  _x_Continue with the treatment plan as outlined on the day of your      exam.Serologic profile suggest gluten intolerance. Please try this diet for 6-8 weeks, then let us know how you're doing. 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 Colon biopsies otherwise were unremarkable without evidence of colitis.  __You should have a repeat colonoscopy examination for this problem           in _ years. Please call us if you are having persistent problems or have questions about your condition that have not been fully answered at this time.  Sincerely,  Mardella Layman MD [facility]   This letter has been electronically signed by your physician.  Appended Document: Patient Notice- Colon Biospy Results letter mailed 5.11.11

## 2010-11-18 NOTE — Assessment & Plan Note (Signed)
Summary: B12//KN   Nurse Visit   Allergies: No Known Drug Allergies  Medication Administration  Injection # 1:    Medication: Vit B12 1000 mcg    Diagnosis: B12 DEFICIENCY (ICD-266.2)    Route: IM    Site: R deltoid    Exp Date: 12/16/2011    Lot #: [id number]    Mfr: American Regent    Given by: Doristine Devoid (April 15, 2010 4:53 PM)  Orders Added: 1)  Admin of Therapeutic Inj  intramuscular or subcutaneous [96372] 2)  Vit B12 1000 mcg [J3420]   Medication Administration  Injection # 1:    Medication: Vit B12 1000 mcg    Diagnosis: B12 DEFICIENCY (ICD-266.2)    Route: IM    Site: R deltoid    Exp Date: 12/16/2011    Lot #: [id number]    Mfr: American Regent    Given by: Doristine Devoid (April 15, 2010 4:53 PM)  Orders Added: 1)  Admin of Therapeutic Inj  intramuscular or subcutaneous [96372] 2)  Vit B12 1000 mcg [J3420]

## 2010-11-18 NOTE — Assessment & Plan Note (Signed)
Summary: diarrhea/lk    History of Present Illness Visit Type: consult Primary GI MD: Sheryn Bison MD FACP FAGA Primary Provider: Laury Axon Requesting Provider: Varney Baas, MD Chief Complaint: pt has been having fecal urgency and pencil-thin stools since mini-stroke in october 2010.Marland Kitchen Pt began Culturelle and loose stools have improved History of Present Illness:   Elderly 77 year old white female who has had diarrhea for 6 months without abdominal pain or rectal bleeding. She has soft mushy steatorrhea-type stools with mild weight loss. She relates this all began 6 months ago when she had a TIA and was placed on aspirin. She denies rectal bleeding, abdominal pain, upper GI or hepatobiliary complaints. Previous colonoscopy several years ago was unremarkable. She has not had recent antibiotic exposure. She denies fever, chills, infectious disease exposure, NSAID use, or history of alcohol or cigarette abuse. She is followed closely by Dr. Varney Baas in gynecology. She is on Prempro, Cozaar, and Synthroid.   GI Review of Systems    Reports bloating.      Denies abdominal pain, acid reflux, belching, chest pain, dysphagia with liquids, dysphagia with solids, heartburn, loss of appetite, nausea, vomiting, vomiting blood, weight loss, and  weight gain.      Reports change in bowel habits, diarrhea, and  fecal incontinence.     Denies anal fissure, black tarry stools, constipation, diverticulosis, heme positive stool, hemorrhoids, irritable bowel syndrome, jaundice, light color stool, liver problems, rectal bleeding, and  rectal pain.    Current Medications (verified): 1)  Cozaar 100 Mg Tabs (Losartan Potassium) .Marland Kitchen.. 1 By Mouth Once Daily 2)  Synthroid 100 Mcg  Tabs (Levothyroxine Sodium) .... Take One Tablet Daily 3)  Prempro 0.625-5 Mg  Tabs (Conj Estrog-Medroxyprogest Ace) .... Take One Tablet Daily 4)  Aspirin 325 Mg Tabs (Aspirin) .... Take 1 Tablet By Mouth Once A Day 5)  Cyanocobalamin  1000 Mcg/ml Soln (Cyanocobalamin) .... Inject 1 Ml Im Once Monthly 6)  Culturelle Digestive Health  Caps (Lactobacillus-Inulin) .... Take 1 Capsule By Mouth Once A Day  Allergies (verified): No Known Drug Allergies  Past History:  Past medical, surgical, family and social histories (including risk factors) reviewed for relevance to current acute and chronic problems.  Past Medical History: Goiter Hypertension Diverticulitis, hx of Hyperlipidemia Stroke "mini" 07/2009  Past Surgical History: Cholecystectomy thyroidectomy, partial uterine polyp Cataract Extraction-bilateral w/lens implants  Family History: Reviewed history from 12/14/2007 and no changes required. Family History of CAD Female 1st degree relative <50 Family History Diabetes 1st degree relative dibetes insipidus M--MVI  Social History: Reviewed history from 12/14/2007 and no changes required. 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  The patient denies allergy/sinus, anemia, anxiety-new, arthritis/joint pain, back pain, blood in urine, breast changes/lumps, confusion, cough, coughing up blood, depression-new, fainting, fatigue, fever, headaches-new, hearing problems, heart murmur, heart rhythm changes, itching, menstrual pain, muscle pains/cramps, night sweats, nosebleeds, pregnancy symptoms, shortness of breath, skin rash, sleeping problems, sore throat, swollen lymph glands, thirst - excessive, urination - excessive, urination changes/pain, vision changes, and voice change.    Vital Signs:  Patient profile:   77 year old female Height:      66 inches Weight:      164 pounds BMI:     26.57 Pulse rate:   80 / minute Pulse rhythm:   regular BP sitting:  164 / 58  (left arm) Cuff size:   regular  Vitals Entered By: Francee Piccolo CMA Duncan Dull) (February 12, 2010 10:59 AM)  Physical Exam  General:  Well developed, well nourished, no acute distress.healthy appearing.   Head:  Normocephalic and atraumatic. Eyes:  PERRLA, no icterus. Lungs:  Clear throughout to auscultation. Heart:  Regular rate and rhythm; no murmurs, rubs,  or bruits. Abdomen:  Soft, nontender and nondistended. No masses, hepatosplenomegaly or hernias noted. Normal bowel sounds. Rectal:  Normal exam.hemocult negative.  Stool is soft, mushy, non-foul-smelling, and is not liquid. Extremities:  No clubbing, cyanosis, edema or deformities noted. Neurologic:  Alert and  oriented x4;  grossly normal neurologically. Cervical Nodes:  No significant cervical adenopathy. Inguinal Nodes:  No significant inguinal adenopathy. Psych:  Alert and cooperative. Normal mood and affect.   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 Labs have been ordered and we will do flexible sigmoidoscopy and colon biopsies. She denies use of sorbitol or fructose.Celiac antibodies also ordered. Orders: TLB-CBC Platelet - w/Differential (85025-CBCD) TLB-BMP (Basic Metabolic Panel-BMET) (80048-METABOL) TLB-Hepatic/Liver Function Pnl (80076-HEPATIC) TLB-TSH (Thyroid Stimulating Hormone) (84443-TSH) TLB-B12, Serum-Total ONLY ([address]-U04) TLB-Ferritin (82728-FER) TLB-Folic Acid (Folate) (82746-FOL) TLB-IBC Pnl (Iron/FE;Transferrin) (83550-IBC) TLB-Sedimentation Rate (ESR) (85652-ESR)  Problem # 2:  B12 DEFICIENCY (ICD-266.2) Assessment: Comment Only Consider possible chronic bacterial overgrowth syndrome.  Problem # 3:  MEMORY LOSS (ICD-780.93) Assessment: Improved  Problem # 4:  HYPERTENSION, BENIGN (ICD-401.1) Assessment: Comment Only  Patient Instructions: 1)  Please go to the basement for lab work. 2)  You are scheduled for a  sigmoidoscopy. 3)  Please continue current medications.  4)  The medication list was reviewed and reconciled.  All changed / newly prescribed medications were explained.  A complete medication list was provided to the patient / caregiver. 5)  Copy sent to : Dr. Varney Baas and Dr. Loreen Freud. 6)  Please continue current medications.  7)  Colonoscopy and Flexible Sigmoidoscopy brochure given.  8)  Conscious Sedation brochure given.   Appended Document: diarrhea/lk    Clinical Lists Changes  Orders: Added new Test order of TLB-IgA (Immunoglobulin A) (82784-IGA) - Signed Added new Test order of T-Sprue Panel (Celiac Disease Aby Eval) (83516x3/[address]) - Signed Added new Test order of Flex with Sedation (Flex w/Sed) - Signed

## 2010-11-18 NOTE — Letter (Signed)
Summary: [facility]  [address] [place], [place] [address]   Phone: [phone]  Fax: [phone]       Belinda Anderson    02/21/1932    MRN: [id number]        Procedure Day /Date: Friday, 02/19/10     Arrival Time: 2:00      Procedure Time: 3:00     Location of Procedure:                    [name] (4th Floor) _  PREPARATION FOR FLEXIBLE SIGMOIDOSCOPY WITH MAGNESIUM CITRATE  Prior to the day before your procedure, purchase one 8 oz. bottle of Magnesium Citrate and one Fleet Enema from the laxative section of your drugstore.  _________________________________________________________________________________________________  THE DAY BEFORE YOUR PROCEDURE             DATE: 02/18/10      DAY: Thursday  1.   Have a clear liquid dinner the night before your procedure.  2.   Do not drink anything colored red or purple.  Avoid juices with pulp.  No orange juice.              CLEAR LIQUIDS INCLUDE: Water Jello Ice Popsicles Tea (sugar ok, no milk/cream) Powdered fruit flavored drinks Coffee (sugar ok, no milk/cream) Gatorade Juice: apple, white grape, white cranberry  Lemonade Clear bullion, consomm, broth Carbonated beverages (any kind) Strained chicken noodle soup Hard Candy   3.   At 7:00 pm the night before your procedure, drink one bottle of Magnesium Citrate over ice.  4.   Drink at least 3 more glasses of clear liquids before bedtime (preferably juices).  5.   Results are expected usually within 1 to 6 hours after taking the Magnesium Citrate.  ___________________________________________________________________________________________________  THE DAY OF YOUR PROCEDURE            DATE: 02/19/10     DAY: Friday  1.   Use Fleet Enema one hour prior to coming for procedure.  2.   You may drink clear liquids until 1:00 pm (2 hours before exam)       MEDICATION INSTRUCTIONS  Unless otherwise  instructed, you should take regular prescription medications with a small sip of water as early as possible the morning of your procedure.                 OTHER INSTRUCTIONS  You will need a responsible adult at least 75 years of age to accompany you and drive you home.   This person must remain in the waiting room during your procedure.  Wear loose fitting clothing that is easily removed.  Leave jewelry and other valuables at home.  However, you may wish to bring a book to read or an iPod/MP3 player to listen to music as you wait for your procedure to start.  Remove all body piercing jewelry and leave at home.  Total time from sign-in until discharge is approximately 2-3 hours.  You should go home directly after your procedure and rest.  You can resume normal activities the day after your procedure.  The day of your procedure you should not:   Drive   Make legal decisions   Operate machinery   Drink alcohol   Return to work  You will receive specific instructions about eating, activities and medications before you leave.   The above instructions have been reviewed and explained to me by  _______________________    I fully understand and can verbalize these instructions _____________________________ Date _________

## 2010-11-18 NOTE — Assessment & Plan Note (Signed)
Summary: B-12 SHOT////SPH   Nurse Visit   Allergies: No Known Drug Allergies  Medication Administration  Injection # 1:    Medication: Vit B12 1000 mcg    Diagnosis: B12 DEFICIENCY (ICD-266.2)    Route: IM    Site: R deltoid    Exp Date: 11/2011    Lot #: [id number]    Mfr: American Regent    Patient tolerated injection without complications    Given by: Army Fossa CMA (Feb 23, 2010 8:14 AM)  Orders Added: 1)  Admin of Therapeutic Inj  intramuscular or subcutaneous [96372] 2)  Vit B12 1000 mcg [J3420]

## 2010-12-27 ENCOUNTER — Telehealth (INDEPENDENT_AMBULATORY_CARE_PROVIDER_SITE_OTHER): Payer: Self-pay | Admitting: *Deleted

## 2010-12-27 ENCOUNTER — Encounter (INDEPENDENT_AMBULATORY_CARE_PROVIDER_SITE_OTHER): Payer: Self-pay | Admitting: *Deleted

## 2011-01-04 NOTE — Progress Notes (Signed)
Summary: Synthroid, Losartan refills  Phone Note Refill Request Call back at Home Phone [phone] Message from:  Patient on December 27, 2010 10:47 AM  Refills Requested: Medication #1:  SYNTHROID 100 MCG  TABS take one tablet daily *LABS ARE DUE NOW*  Medication #2:  COZAAR 100 MG TABS 1 by mouth once daily [facility], [address], [place], [place]  Next Appointment Scheduled: none Initial call taken by: Jerolyn Shin,  December 27, 2010 10:51 AM    Prescriptions: SYNTHROID 100 MCG  TABS (LEVOTHYROXINE SODIUM) take one tablet daily *LABS ARE DUE NOW*  #30 x 0   Entered by:   Almeta Monas CMA (AAMA)   Authorized by:   Loreen Freud DO   Signed by:   Almeta Monas CMA (AAMA) on 12/27/2010   Method used:   Electronically to        [facility]  [id number]* (retail)       [address]       [place], [place]  [address]       Ph: [phone] or [phone]       Fax: [phone]   RxID:   [id number] COZAAR 100 MG TABS (LOSARTAN POTASSIUM) 1 by mouth once daily  #30 Tablet x 0   Entered by:   Almeta Monas CMA (AAMA)   Authorized by:   Loreen Freud DO   Signed by:   Almeta Monas CMA (AAMA) on 12/27/2010   Method used:   Electronically to        [facility] [address] [id number] (retail)       [address]       [place], [place]  [address]       Ph: [phone] or [phone]       Fax: [phone]   RxID:   [id number]

## 2011-01-04 NOTE — Letter (Signed)
Summary: Primary Care Appointment Letter  [facility]  [address] [place], [place] [address]   Phone: [phone]  Fax: [phone]    12/27/2010 MRN: [id number]     [name] [address] [place], [place]  [address]      Dear Ms. Luster Landsberg,   Your Primary Care Physician Loreen Freud DO has indicated that:    ___X____it is time to schedule an appointment for a physical and fasting labs.    _______you missed your appointment on______ and need to call and          reschedule.    _______you need to have lab work done.    _______you need to schedule an appointment discuss lab or test results.    _______you need to call to reschedule your appointment that is                       scheduled on _________.     Please call our office as soon as possible. Our phone number is [phone]. Please press option 1. Our office is open 8a-5p, Monday through Friday.     Thank you, [place] [facility] Scheduler

## 2011-01-27 ENCOUNTER — Encounter: Payer: Self-pay | Admitting: Family Medicine

## 2011-01-28 ENCOUNTER — Encounter: Payer: Self-pay | Admitting: Family Medicine

## 2011-01-28 ENCOUNTER — Ambulatory Visit (INDEPENDENT_AMBULATORY_CARE_PROVIDER_SITE_OTHER): Payer: Medicare Other | Admitting: Family Medicine

## 2011-01-28 VITALS — BP 130/88 | Temp 97.7°F | Wt 167.0 lb

## 2011-01-28 DIAGNOSIS — E039 Hypothyroidism, unspecified: Secondary | ICD-10-CM

## 2011-01-28 DIAGNOSIS — I10 Essential (primary) hypertension: Secondary | ICD-10-CM

## 2011-01-28 DIAGNOSIS — Z0000 Encounter for general adult medical examination without abnormal findings: Secondary | ICD-10-CM

## 2011-01-28 DIAGNOSIS — E538 Deficiency of other specified B group vitamins: Secondary | ICD-10-CM

## 2011-01-28 LAB — BASIC METABOLIC PANEL
BUN: 17 mg/dL (ref 6–23)
Calcium: 9.1 mg/dL (ref 8.4–10.5)
Chloride: 106 mEq/L (ref 96–112)
Creatinine, Ser: 0.8 mg/dL (ref 0.4–1.2)

## 2011-01-28 LAB — HEPATIC FUNCTION PANEL
ALT: 14 U/L (ref 0–35)
AST: 18 U/L (ref 0–37)
Albumin: 3.8 g/dL (ref 3.5–5.2)

## 2011-01-28 LAB — POCT URINALYSIS DIPSTICK
Ketones, UA: NEGATIVE
Leukocytes, UA: NEGATIVE
Protein, UA: NEGATIVE
Spec Grav, UA: 1.025
Urobilinogen, UA: 0.2
pH, UA: 5

## 2011-01-28 LAB — CBC WITH DIFFERENTIAL/PLATELET
Basophils Relative: 0.4 % (ref 0.0–3.0)
Eosinophils Absolute: 0.1 10*3/uL (ref 0.0–0.7)
Hemoglobin: 13.8 g/dL (ref 12.0–15.0)
Lymphs Abs: 1.6 10*3/uL (ref 0.7–4.0)
MCHC: 34.2 g/dL (ref 30.0–36.0)
MCV: 95.7 fl (ref 78.0–100.0)
Monocytes Absolute: 0.6 10*3/uL (ref 0.1–1.0)
Neutro Abs: 3.6 10*3/uL (ref 1.4–7.7)
RBC: 4.22 Mil/uL (ref 3.87–5.11)
RDW: 13.9 % (ref 11.5–14.6)

## 2011-01-28 LAB — LIPID PANEL
Cholesterol: 199 mg/dL (ref 0–200)
Triglycerides: 88 mg/dL (ref 0.0–149.0)

## 2011-01-28 MED ORDER — LOSARTAN POTASSIUM 100 MG PO TABS: 100.0000 mg | ORAL_TABLET | Freq: Every day | ORAL | Status: DC

## 2011-01-28 MED ORDER — MELOXICAM 7.5 MG PO TABS: 7.5000 mg | ORAL_TABLET | Freq: Every day | ORAL | Status: DC

## 2011-01-28 MED ORDER — ZOSTER VACCINE LIVE 19400 UNT/0.65ML ~~LOC~~ SOLR: 0.6500 mL | Freq: Once | SUBCUTANEOUS | Status: AC

## 2011-01-28 MED ORDER — LEVOTHYROXINE SODIUM 100 MCG PO TABS: 100.0000 ug | ORAL_TABLET | Freq: Every day | ORAL | Status: DC

## 2011-01-28 NOTE — Assessment & Plan Note (Signed)
Check labs con't meds 

## 2011-01-28 NOTE — Progress Notes (Signed)
  Subjective:     Belinda Anderson is a 77 y.o. female and is here for a comprehensive physical exam. The patient reports problems - arthritis pain in hands and legs.  History   Social History  . Marital Status: Widowed    Spouse Name: N/A    Number of Children: N/A  . Years of Education: N/A   Occupational History  . Not on file.   Social History Main Topics  . Smoking status: Never Smoker   . Smokeless tobacco: Not on file  . Alcohol Use: No  . Drug Use: No  . Sexually Active: Not Currently   Other Topics Concern  . Not on file   Social History Narrative  . No narrative on file   Health Maintenance  Topic Date Due  . Tetanus/tdap  05/12/1953  . Zostavax  05/12/1994  . Influenza Vaccine  07/18/2011  . Colonoscopy  01/30/2017  . Pneumococcal Polysaccharide Vaccine Age 55 And Over  Completed    The following portions of the patient's history were reviewed and updated as appropriate: allergies, current medications, past family history, past medical history, past social history, past surgical history and problem list.  Review of Systems  Review of Systems  Constitutional: Negative for activity change, appetite change and fatigue.  HENT: Negative for hearing loss, congestion, tinnitus and ear discharge.   Eyes: Negative for visual disturbance (see optho q1y -- vision corrected to 20/20 with glasses and lens implants).  Respiratory: Negative for cough, chest tightness and shortness of breath.   Cardiovascular: Negative for chest pain, palpitations and leg swelling.  Gastrointestinal: Negative for abdominal pain, diarrhea, constipation and abdominal distention.  Genitourinary: Negative for urgency, frequency, decreased urine volume and difficulty urinating.  Musculoskeletal: Negative for back pain, + pain in hands and legs especially in am Skin: Negative for color change, pallor and rash.  Neurological: Negative for dizziness, light-headedness, numbness and headaches.    Hematological: Negative for adenopathy. Does not bruise/bleed easily.  Psychiatric/Behavioral: Negative for suicidal ideas, confusion, sleep disturbance, self-injury, dysphoric mood, decreased concentration and agitation.  Pt is able to read and write and can do all ADLs No risk for falling No abuse/ violence in home     Objective:    BP 130/88  Temp(Src) 97.7 F (36.5 C) (Oral)  Wt 167 lb (75.751 kg) General appearance: alert, cooperative, appears stated age and no distress Head: Normocephalic, without obvious abnormality, atraumatic Eyes: conjunctivae/corneas clear. PERRL, EOM's intact. Fundi benign. Ears: normal TM's and external ear canals both ears Nose: Nares normal. Septum midline. Mucosa normal. No drainage or sinus tenderness. Throat: lips, mucosa, and tongue normal; teeth and gums normal Neck: no adenopathy, no carotid bruit, no JVD, supple, symmetrical, trachea midline and thyroid not enlarged, symmetric, no tenderness/mass/nodules Lungs: clear to auscultation bilaterally Breasts: gyn Heart: +S!S2  + m-- 2/6 Abdomen: soft, non-tender; bowel sounds normal; no masses,  no organomegaly Pelvic: gyn Extremities: extremities normal, atraumatic, no cyanosis or edema Pulses: 2+ and symmetric Skin: Skin color, texture, turgor normal. No rashes or lesions Lymph nodes: Cervical, supraclavicular, and axillary nodes normal. Neurologic: Grossly normal   Psych-- not suicidal, anxious or depressed Assessment:    Healthy female exam.   Plan:     See After Visit Summary for Counseling Recommendations

## 2011-01-28 NOTE — Assessment & Plan Note (Signed)
Cont meds Check labs 

## 2011-01-28 NOTE — Progress Notes (Signed)
Addended by: Floydene Flock on: 01/28/2011 12:31 PM   Modules accepted: Orders

## 2011-01-31 NOTE — Progress Notes (Signed)
  Subjective:    Patient ID: Belinda Anderson, female    DOB: 1932/05/06, 77 y.o.   MRN: [id number]  HPI    Review of Systems     Objective:   Physical Exam  Psychiatric: She has a normal mood and affect. Her speech is normal and behavior is normal. Judgment and thought content normal. Cognition and memory are normal.          Assessment & Plan:

## 2011-02-01 ENCOUNTER — Telehealth: Payer: Self-pay | Admitting: *Deleted

## 2011-02-01 ENCOUNTER — Encounter: Payer: Self-pay | Admitting: *Deleted

## 2011-02-01 NOTE — Telephone Encounter (Addendum)
Tried to call Pt no answer or VM will try again later  Message copied by Candie Echevaria on Tue Feb 01, 2011  5:15 PM ------      Message from: Loreen Freud      Created: Fri Jan 28, 2011  5:46 PM       Alk phos elevated----recheck fractionated alk phos  790.4      LDL elevated---- Cholesterol--- LDL goal < 100,  HDL >40,  TG < 150.  Diet and exercise will increase HDL and decrease LDL and TG.  Fish,  Fish Oil, Flaxseed oil will also help increase the HDL and decrease Triglycerides.   Recheck labs in 3 months.  272.4  Lipid, hep

## 2011-02-02 NOTE — Telephone Encounter (Signed)
Pt aware.

## 2011-02-04 ENCOUNTER — Other Ambulatory Visit (INDEPENDENT_AMBULATORY_CARE_PROVIDER_SITE_OTHER): Payer: Medicare Other

## 2011-02-04 ENCOUNTER — Other Ambulatory Visit: Payer: Self-pay | Admitting: Family Medicine

## 2011-02-04 DIAGNOSIS — R7401 Elevation of levels of liver transaminase levels: Secondary | ICD-10-CM

## 2011-02-10 ENCOUNTER — Telehealth: Payer: Self-pay | Admitting: *Deleted

## 2011-02-10 NOTE — Telephone Encounter (Signed)
Tried to call Pt no answer or VM to leave message will try again later.

## 2011-02-10 NOTE — Telephone Encounter (Signed)
Message copied by Candie Echevaria on Thu Feb 10, 2011  9:59 AM ------      Message from: Loreen Freud      Created: Thu Feb 10, 2011  9:33 AM       Anything other than Mobic will make her feel worse---has she tried breaking mobic in half?

## 2011-02-11 NOTE — Telephone Encounter (Signed)
Discuss with patient  

## 2011-03-04 NOTE — Assessment & Plan Note (Signed)
[facility]                                   ON-CALL NOTE   [name], [name]                      MRN:          [id number]  DATE:05/06/2006                            DOB:          1932/04/25    Telephone triage note on July 21st.  Time received is 8:11 a.m. The patient  is Belinda Anderson.  Telephone [phone].   The patient has what she thinks is a spider bite on her ankle.  This came up  yesterday after mowing the grass.  Now this morning it is swollen and  painful.  She is applying ice.  My response is to see me in the [facility] later this morning.                                   Tera Mater. Clent Ridges, MD   SAF/MedQ  DD:  05/06/2006  DT:  05/06/2006  Job #:  [id number]   cc:   Lelon Perla, DO

## 2011-04-08 ENCOUNTER — Other Ambulatory Visit: Payer: Self-pay | Admitting: Family Medicine

## 2011-04-08 DIAGNOSIS — R7401 Elevation of levels of liver transaminase levels: Secondary | ICD-10-CM

## 2011-04-11 ENCOUNTER — Other Ambulatory Visit (INDEPENDENT_AMBULATORY_CARE_PROVIDER_SITE_OTHER): Payer: Medicare Other

## 2011-04-11 DIAGNOSIS — R7401 Elevation of levels of liver transaminase levels: Secondary | ICD-10-CM

## 2011-04-11 LAB — ALKALINE PHOSPHATASE: Alkaline Phosphatase: 123 U/L — ABNORMAL HIGH (ref 39–117)

## 2011-04-11 NOTE — Progress Notes (Signed)
Labs only

## 2011-06-10 ENCOUNTER — Other Ambulatory Visit: Payer: Self-pay | Admitting: Family Medicine

## 2011-06-10 NOTE — Telephone Encounter (Signed)
Last seen and filled 01/28/11 #30 with 2 refills.   Please advise    KP

## 2011-12-24 ENCOUNTER — Other Ambulatory Visit: Payer: Self-pay | Admitting: Family Medicine

## 2011-12-26 NOTE — Telephone Encounter (Signed)
Last seen 01/28/11 and filled 06/10/11 # 30 with 2 refills. Pending apt 02/16/12. Please advise    KP

## 2012-01-29 ENCOUNTER — Other Ambulatory Visit: Payer: Self-pay | Admitting: Family Medicine

## 2012-01-30 NOTE — Telephone Encounter (Signed)
Patient needs to schedule a CPX with fasting labs  

## 2012-02-10 ENCOUNTER — Other Ambulatory Visit: Payer: Self-pay | Admitting: Family Medicine

## 2012-02-16 ENCOUNTER — Ambulatory Visit (INDEPENDENT_AMBULATORY_CARE_PROVIDER_SITE_OTHER): Payer: Medicare Other | Admitting: Family Medicine

## 2012-02-16 ENCOUNTER — Encounter: Payer: Self-pay | Admitting: Family Medicine

## 2012-02-16 VITALS — BP 140/90 | HR 82 | Temp 98.1°F | Ht 65.5 in | Wt 161.4 lb

## 2012-02-16 DIAGNOSIS — Z0000 Encounter for general adult medical examination without abnormal findings: Secondary | ICD-10-CM

## 2012-02-16 DIAGNOSIS — I10 Essential (primary) hypertension: Secondary | ICD-10-CM

## 2012-02-16 DIAGNOSIS — M1990 Unspecified osteoarthritis, unspecified site: Secondary | ICD-10-CM

## 2012-02-16 DIAGNOSIS — E039 Hypothyroidism, unspecified: Secondary | ICD-10-CM

## 2012-02-16 LAB — POCT URINALYSIS DIPSTICK
Bilirubin, UA: NEGATIVE
Blood, UA: NEGATIVE
Glucose, UA: NEGATIVE
Leukocytes, UA: NEGATIVE
Nitrite, UA: NEGATIVE

## 2012-02-16 MED ORDER — MELOXICAM 7.5 MG PO TABS: 7.5000 mg | ORAL_TABLET | Freq: Every day | ORAL | Status: DC

## 2012-02-16 MED ORDER — LOSARTAN POTASSIUM 100 MG PO TABS: 100.0000 mg | ORAL_TABLET | Freq: Every day | ORAL | Status: DC

## 2012-02-16 NOTE — Progress Notes (Signed)
Subjective:    Belinda Anderson is a 78 y.o. female who presents for Medicare Annual/Subsequent preventive examination.  Preventive Screening-Counseling & Management  Tobacco History  Smoking status  . Never Smoker   Smokeless tobacco  . Not on file     Problems Prior to Visit 1.   Current Problems (verified) Patient Active Problem List  Diagnoses  . GOITER  . UNSPECIFIED HYPOTHYROIDISM  . B12 DEFICIENCY  . MIXED ACID-BASE BALANCE DISORDER  . ACQUIRED HEMOLYTIC ANEMIA UNSPECIFIED  . HYPERTENSION, BENIGN  . HYPERTENSION  . UTERINE POLYP  . SYNCOPE  . DIZZINESS  . MEMORY LOSS  . LEG EDEMA, RIGHT  . DIARRHEA  . OTHER DRUG ALLERGY  . ANEURYSM, HX OF  . DIVERTICULITIS, HX OF  . HEMATURIA, HX OF  . THYROIDECTOMY, HX OF    Medications Prior to Visit Current Outpatient Prescriptions on File Prior to Visit  Medication Sig Dispense Refill  . aspirin EC 325 MG EC tablet Take 325 mg by mouth daily.        . cyanocobalamin (,VITAMIN B-12,) 1000 MCG/ML injection Inject 1,000 mcg into the muscle every 30 (thirty) days.        . Cyanocobalamin (VITAMIN B-12 PO) Take by mouth daily.        Marland Kitchen lactase (LACTAID) 3000 UNITS tablet Take 1 tablet by mouth as needed.        . Lactobacillus-Inulin (CULTURELLE DIGESTIVE HEALTH) CAPS Take by mouth daily.        Marland Kitchen levothyroxine (SYNTHROID, LEVOTHROID) 100 MCG tablet take 1 tablet by mouth once daily  90 tablet  0  . DISCONTD: losartan (COZAAR) 100 MG tablet take 1 tablet by mouth once daily  90 tablet  3  . DISCONTD: meloxicam (MOBIC) 7.5 MG tablet take 1 tablet by mouth once daily  30 tablet  2    Current Medications (verified) Current Outpatient Prescriptions  Medication Sig Dispense Refill  . aspirin EC 325 MG EC tablet Take 325 mg by mouth daily.        . cyanocobalamin (,VITAMIN B-12,) 1000 MCG/ML injection Inject 1,000 mcg into the muscle every 30 (thirty) days.        . Cyanocobalamin (VITAMIN B-12 PO) Take by mouth daily.         Marland Kitchen lactase (LACTAID) 3000 UNITS tablet Take 1 tablet by mouth as needed.        . Lactobacillus-Inulin (CULTURELLE DIGESTIVE HEALTH) CAPS Take by mouth daily.        Marland Kitchen levothyroxine (SYNTHROID, LEVOTHROID) 100 MCG tablet take 1 tablet by mouth once daily  90 tablet  0  . losartan (COZAAR) 100 MG tablet Take 1 tablet (100 mg total) by mouth daily.  90 tablet  3  . meloxicam (MOBIC) 7.5 MG tablet Take 1 tablet (7.5 mg total) by mouth daily.  30 tablet  2  . DISCONTD: losartan (COZAAR) 100 MG tablet take 1 tablet by mouth once daily  90 tablet  3  . DISCONTD: meloxicam (MOBIC) 7.5 MG tablet take 1 tablet by mouth once daily  30 tablet  2     Allergies (verified) Review of patient's allergies indicates no known allergies.   PAST HISTORY  Family History Family History  Problem Relation Age of Onset  . Coronary artery disease    . Diabetes Brother     DI    Social History History  Substance Use Topics  . Smoking status: Never Smoker   . Smokeless tobacco: Not on file  .  Alcohol Use: No     Are there smokers in your home (other than you)? No  Risk Factors Current exercise habits: The patient does not participate in regular exercise at present.  Dietary issues discussed: na   Cardiac risk factors: advanced age (older than 51 for men, 57 for women), hypertension and sedentary lifestyle.  Depression Screen (Note: if answer to either of the following is "Yes", a more complete depression screening is indicated)   Over the past two weeks, have you felt down, depressed or hopeless? No  Over the past two weeks, have you felt little interest or pleasure in doing things? No  Have you lost interest or pleasure in daily life? No  Do you often feel hopeless? No  Do you cry easily over simple problems? No  Activities of Daily Living In your present state of health, do you have any difficulty performing the following activities?:  Driving? No Managing money?  No Feeding yourself?  No Getting from bed to chair? No Climbing a flight of stairs? No Preparing food and eating?: No Bathing or showering? No Getting dressed: No Getting to the toilet? No Using the toilet:No Moving around from place to place: No In the past year have you fallen or had a near fall?:No   Are you sexually active?  No  Do you have more than one partner?  No  Hearing Difficulties: No Do you often ask people to speak up or repeat themselves? No Do you experience ringing or noises in your ears? No Do you have difficulty understanding soft or whispered voices? No   Do you feel that you have a problem with memory? No  Do you often misplace items? No  Do you feel safe at home?  Yes  Cognitive Testing  Alert? Yes  Normal Appearance?Yes  Oriented to person? Yes  Place? Yes   Time? Yes  Recall of three objects?  Yes  Can perform simple calculations? Yes  Displays appropriate judgment?Yes  Can read the correct time from a watch face?Yes   Advanced Directives have been discussed with the patient? Yes  List the Names of Other Physician/Practitioners you currently use: 1.  Gyn-- [facility]ale  Indicate any recent Medical Services you may have received from other than [facility] providers in the past year (date may be approximate).  Immunization History  Administered Date(s) Administered  . Influenza Whole 08/06/2008, 07/20/2009, 07/06/2010  . Pneumococcal Polysaccharide 07/31/2000    Screening Tests Health Maintenance  Topic Date Due  . Tetanus/tdap  [birth year]/1953  . Zostavax  05/12/1994  . Mammogram  03/18/2012  . Pap Smear  04/17/2012  . Influenza Vaccine  07/17/2012  . Colonoscopy  01/30/2017  . Pneumococcal Polysaccharide Vaccine Age 79 And Over  Completed    All answers were reviewed with the patient and necessary referrals were made:  Loreen Freud, DO   02/16/2012   History reviewed: allergies, current medications, past family history, past medical history, past social history, past surgical  history and problem list  Review of Systems  Review of Systems  Constitutional: Negative for activity change, appetite change and fatigue.  HENT: Negative for hearing loss, congestion, tinnitus and ear discharge.   Eyes: Negative for visual disturbance (see optho q1y -- vision corrected to 20/20 with glasses).  Respiratory: Negative for cough, chest tightness and shortness of breath.   Cardiovascular: Negative for chest pain, palpitations and leg swelling.  Gastrointestinal: Negative for abdominal pain, diarrhea, constipation and abdominal distention.  Genitourinary: Negative for urgency, frequency,  decreased urine volume and difficulty urinating.  Musculoskeletal: Negative for back pain, arthralgias and gait problem.  Skin: Negative for color change, pallor and rash.  Neurological: Negative for dizziness, light-headedness, numbness and headaches.  Hematological: Negative for adenopathy. Does not bruise/bleed easily.  Psychiatric/Behavioral: Negative for suicidal ideas, confusion, sleep disturbance, self-injury, dysphoric mood, decreased concentration and agitation.  Pt is able to read and write and can do all ADLs No risk for falling No abuse/ violence in home      Objective:     Vision by Snellen chart: opth Body mass index is 26.45 kg/(m^2). BP 140/90  Pulse 82  Temp(Src) 98.1 F (36.7 C) (Oral)  Ht 5' 5.5" (1.664 m)  Wt 161 lb 6.4 oz (73.211 kg)  BMI 26.45 kg/m2  SpO2 97%  BP 140/90  Pulse 82  Temp(Src) 98.1 F (36.7 C) (Oral)  Ht 5' 5.5" (1.664 m)  Wt 161 lb 6.4 oz (73.211 kg)  BMI 26.45 kg/m2  SpO2 97% General appearance: alert, cooperative and appears stated age Head: Normocephalic, without obvious abnormality, atraumatic Eyes: negative findings: lids and lashes normal, conjunctivae and sclerae normal and pupils equal, round, reactive to light and accomodation Ears: normal TM's and external ear canals both ears Nose: Nares normal. Septum midline. Mucosa normal.  No drainage or sinus tenderness. Throat: lips, mucosa, and tongue normal; teeth and gums normal Neck: no adenopathy, no carotid bruit, no JVD, supple, symmetrical, trachea midline and thyroid not enlarged, symmetric, no tenderness/mass/nodules Back: symmetric, no curvature. ROM normal. No CVA tenderness. Lungs: clear to auscultation bilaterally Breasts: gyn Heart: regular rate and rhythm, S1, S2 normal, no murmur, click, rub or gallop Abdomen: soft, non-tender; bowel sounds normal; no masses,  no organomegaly Pelvic: gyn Extremities: extremities normal, atraumatic, no cyanosis or edema Pulses: 2+ and symmetric Skin: Skin color, texture, turgor normal. No rashes or lesions Lymph nodes: Cervical, supraclavicular, and axillary nodes normal. Neurologic: Alert and oriented X 3, normal strength and tone. Normal symmetric reflexes. Normal coordination and gait Psych--no depression, anxiety    Assessment:     cpe     Plan:     During the course of the visit the patient was educated and counseled about appropriate screening and preventive services including:    Pneumococcal vaccine   Influenza vaccine  Screening mammography  Screening Pap smear and pelvic exam   Bone densitometry screening  Colorectal cancer screening  Advanced directives: has NO advanced directive - not interested in additional information  Diet review for nutrition referral? Yes ____  Not Indicated __x__   Patient Instructions (the written plan) was given to the patient.  Medicare Attestation I have personally reviewed: The patient's medical and social history Their use of alcohol, tobacco or illicit drugs Their current medications and supplements The patient's functional ability including ADLs,fall risks, home safety risks, cognitive, and hearing and visual impairment Diet and physical activities Evidence for depression or mood disorders  The patient's weight, height, BMI, and visual acuity have  been recorded in the chart.  I have made referrals, counseling, and provided education to the patient based on review of the above and I have provided the patient with a written personalized care plan for preventive services.     Loreen Freud, DO   02/16/2012

## 2012-02-16 NOTE — Assessment & Plan Note (Signed)
Elevated today Pt has been under a lot of stress con't meds Recheck 2-3 weeks

## 2012-02-16 NOTE — Assessment & Plan Note (Signed)
Check labs con't synthroid 

## 2012-02-16 NOTE — Patient Instructions (Signed)
Preventive Care for Adults, Female A healthy lifestyle and preventive care can promote health and wellness. Preventive health guidelines for women include the following key practices.  A routine yearly physical is a good way to check with your caregiver about your health and preventive screening. It is a chance to share any concerns and updates on your health, and to receive a thorough exam.   Visit your dentist for a routine exam and preventive care every 6 months. Brush your teeth twice a day and floss once a day. Good oral hygiene prevents tooth decay and gum disease.   The frequency of eye exams is based on your age, health, family medical history, use of contact lenses, and other factors. Follow your caregiver's recommendations for frequency of eye exams.   Eat a healthy diet. Foods like vegetables, fruits, whole grains, low-fat dairy products, and lean protein foods contain the nutrients you need without too many calories. Decrease your intake of foods high in solid fats, added sugars, and salt. Eat the right amount of calories for you.Get information about a proper diet from your caregiver, if necessary.   Regular physical exercise is one of the most important things you can do for your health. Most adults should get at least 150 minutes of moderate-intensity exercise (any activity that increases your heart rate and causes you to sweat) each week. In addition, most adults need muscle-strengthening exercises on 2 or more days a week.   Maintain a healthy weight. The body mass index (BMI) is a screening tool to identify possible weight problems. It provides an estimate of body fat based on height and weight. Your caregiver can help determine your BMI, and can help you achieve or maintain a healthy weight.For adults 20 years and older:   A BMI below 18.5 is considered underweight.   A BMI of 18.5 to 24.9 is normal.   A BMI of 25 to 29.9 is considered overweight.   A BMI of 30 and above is  considered obese.   Maintain normal blood lipids and cholesterol levels by exercising and minimizing your intake of saturated fat. Eat a balanced diet with plenty of fruit and vegetables. Blood tests for lipids and cholesterol should begin at age 20 and be repeated every 5 years. If your lipid or cholesterol levels are high, you are over 50, or you are at high risk for heart disease, you may need your cholesterol levels checked more frequently.Ongoing high lipid and cholesterol levels should be treated with medicines if diet and exercise are not effective.   If you smoke, find out from your caregiver how to quit. If you do not use tobacco, do not start.   If you are pregnant, do not drink alcohol. If you are breastfeeding, be very cautious about drinking alcohol. If you are not pregnant and choose to drink alcohol, do not exceed 1 drink per day. One drink is considered to be 12 ounces (355 mL) of beer, 5 ounces (148 mL) of wine, or 1.5 ounces (44 mL) of liquor.   Avoid use of street drugs. Do not share needles with anyone. Ask for help if you need support or instructions about stopping the use of drugs.   High blood pressure causes heart disease and increases the risk of stroke. Your blood pressure should be checked at least every 1 to 2 years. Ongoing high blood pressure should be treated with medicines if weight loss and exercise are not effective.   If you are 55 to 76   years old, ask your caregiver if you should take aspirin to prevent strokes.   Diabetes screening involves taking a blood sample to check your fasting blood sugar level. This should be done once every 3 years, after age 45, if you are within normal weight and without risk factors for diabetes. Testing should be considered at a younger age or be carried out more frequently if you are overweight and have at least 1 risk factor for diabetes.   Breast cancer screening is essential preventive care for women. You should practice "breast  self-awareness." This means understanding the normal appearance and feel of your breasts and may include breast self-examination. Any changes detected, no matter how small, should be reported to a caregiver. Women in their 20s and 30s should have a clinical breast exam (CBE) by a caregiver as part of a regular health exam every 1 to 3 years. After age 40, women should have a CBE every year. Starting at age 40, women should consider having a mammography (breast X-ray test) every year. Women who have a family history of breast cancer should talk to their caregiver about genetic screening. Women at a high risk of breast cancer should talk to their caregivers about having magnetic resonance imaging (MRI) and a mammography every year.   The Pap test is a screening test for cervical cancer. A Pap test can show cell changes on the cervix that might become cervical cancer if left untreated. A Pap test is a procedure in which cells are obtained and examined from the lower end of the uterus (cervix).   Women should have a Pap test starting at age 21.   Between ages 21 and 29, Pap tests should be repeated every 2 years.   Beginning at age 30, you should have a Pap test every 3 years as long as the past 3 Pap tests have been normal.   Some women have medical problems that increase the chance of getting cervical cancer. Talk to your caregiver about these problems. It is especially important to talk to your caregiver if a new problem develops soon after your last Pap test. In these cases, your caregiver may recommend more frequent screening and Pap tests.   The above recommendations are the same for women who have or have not gotten the vaccine for human papillomavirus (HPV).   If you had a hysterectomy for a problem that was not cancer or a condition that could lead to cancer, then you no longer need Pap tests. Even if you no longer need a Pap test, a regular exam is a good idea to make sure no other problems are  starting.   If you are between ages 65 and 70, and you have had normal Pap tests going back 10 years, you no longer need Pap tests. Even if you no longer need a Pap test, a regular exam is a good idea to make sure no other problems are starting.   If you have had past treatment for cervical cancer or a condition that could lead to cancer, you need Pap tests and screening for cancer for at least 20 years after your treatment.   If Pap tests have been discontinued, risk factors (such as a new sexual partner) need to be reassessed to determine if screening should be resumed.   The HPV test is an additional test that may be used for cervical cancer screening. The HPV test looks for the virus that can cause the cell changes on the cervix.   The cells collected during the Pap test can be tested for HPV. The HPV test could be used to screen women aged 30 years and older, and should be used in women of any age who have unclear Pap test results. After the age of 30, women should have HPV testing at the same frequency as a Pap test.   Colorectal cancer can be detected and often prevented. Most routine colorectal cancer screening begins at the age of 50 and continues through age 75. However, your caregiver may recommend screening at an earlier age if you have risk factors for colon cancer. On a yearly basis, your caregiver may provide home test kits to check for hidden blood in the stool. Use of a small camera at the end of a tube, to directly examine the colon (sigmoidoscopy or colonoscopy), can detect the earliest forms of colorectal cancer. Talk to your caregiver about this at age 50, when routine screening begins. Direct examination of the colon should be repeated every 5 to 10 years through age 75, unless early forms of pre-cancerous polyps or small growths are found.   Hepatitis C blood testing is recommended for all people born from 1945 through 1965 and any individual with known risks for hepatitis C.    Practice safe sex. Use condoms and avoid high-risk sexual practices to reduce the spread of sexually transmitted infections (STIs). STIs include gonorrhea, chlamydia, syphilis, trichomonas, herpes, HPV, and human immunodeficiency virus (HIV). Herpes, HIV, and HPV are viral illnesses that have no cure. They can result in disability, cancer, and death. Sexually active women aged 25 and younger should be checked for chlamydia. Older women with new or multiple partners should also be tested for chlamydia. Testing for other STIs is recommended if you are sexually active and at increased risk.   Osteoporosis is a disease in which the bones lose minerals and strength with aging. This can result in serious bone fractures. The risk of osteoporosis can be identified using a bone density scan. Women ages 65 and over and women at risk for fractures or osteoporosis should discuss screening with their caregivers. Ask your caregiver whether you should take a calcium supplement or vitamin D to reduce the rate of osteoporosis.   Menopause can be associated with physical symptoms and risks. Hormone replacement therapy is available to decrease symptoms and risks. You should talk to your caregiver about whether hormone replacement therapy is right for you.   Use sunscreen with sun protection factor (SPF) of 30 or more. Apply sunscreen liberally and repeatedly throughout the day. You should seek shade when your shadow is shorter than you. Protect yourself by wearing long sleeves, pants, a wide-brimmed hat, and sunglasses year round, whenever you are outdoors.   Once a month, do a whole body skin exam, using a mirror to look at the skin on your back. Notify your caregiver of new moles, moles that have irregular borders, moles that are larger than a pencil eraser, or moles that have changed in shape or color.   Stay current with required immunizations.   Influenza. You need a dose every fall (or winter). The composition of  the flu vaccine changes each year, so being vaccinated once is not enough.   Pneumococcal polysaccharide. You need 1 to 2 doses if you smoke cigarettes or if you have certain chronic medical conditions. You need 1 dose at age 65 (or older) if you have never been vaccinated.   Tetanus, diphtheria, pertussis (Tdap, Td). Get 1 dose of   Tdap vaccine if you are younger than age 65, are over 65 and have contact with an infant, are a healthcare worker, are pregnant, or simply want to be protected from whooping cough. After that, you need a Td booster dose every 10 years. Consult your caregiver if you have not had at least 3 tetanus and diphtheria-containing shots sometime in your life or have a deep or dirty wound.   HPV. You need this vaccine if you are a woman age 26 or younger. The vaccine is given in 3 doses over 6 months.   Measles, mumps, rubella (MMR). You need at least 1 dose of MMR if you were born in 1957 or later. You may also need a second dose.   Meningococcal. If you are age 19 to 21 and a first-year college student living in a residence hall, or have one of several medical conditions, you need to get vaccinated against meningococcal disease. You may also need additional booster doses.   Zoster (shingles). If you are age 60 or older, you should get this vaccine.   Varicella (chickenpox). If you have never had chickenpox or you were vaccinated but received only 1 dose, talk to your caregiver to find out if you need this vaccine.   Hepatitis A. You need this vaccine if you have a specific risk factor for hepatitis A virus infection or you simply wish to be protected from this disease. The vaccine is usually given as 2 doses, 6 to 18 months apart.   Hepatitis B. You need this vaccine if you have a specific risk factor for hepatitis B virus infection or you simply wish to be protected from this disease. The vaccine is given in 3 doses, usually over 6 months.  Preventive Services /  Frequency Ages 19 to 39  Blood pressure check.** / Every 1 to 2 years.   Lipid and cholesterol check.** / Every 5 years beginning at age 20.   Clinical breast exam.** / Every 3 years for women in their 20s and 30s.   Pap test.** / Every 2 years from ages 21 through 29. Every 3 years starting at age 30 through age 65 or 70 with a history of 3 consecutive normal Pap tests.   HPV screening.** / Every 3 years from ages 30 through ages 65 to 70 with a history of 3 consecutive normal Pap tests.   Hepatitis C blood test.** / For any individual with known risks for hepatitis C.   Skin self-exam. / Monthly.   Influenza immunization.** / Every year.   Pneumococcal polysaccharide immunization.** / 1 to 2 doses if you smoke cigarettes or if you have certain chronic medical conditions.   Tetanus, diphtheria, pertussis (Tdap, Td) immunization. / A one-time dose of Tdap vaccine. After that, you need a Td booster dose every 10 years.   HPV immunization. / 3 doses over 6 months, if you are 26 and younger.   Measles, mumps, rubella (MMR) immunization. / You need at least 1 dose of MMR if you were born in 1957 or later. You may also need a second dose.   Meningococcal immunization. / 1 dose if you are age 19 to 21 and a first-year college student living in a residence hall, or have one of several medical conditions, you need to get vaccinated against meningococcal disease. You may also need additional booster doses.   Varicella immunization.** / Consult your caregiver.   Hepatitis A immunization.** / Consult your caregiver. 2 doses, 6 to 18 months   apart.   Hepatitis B immunization.** / Consult your caregiver. 3 doses usually over 6 months.  Ages 40 to 64  Blood pressure check.** / Every 1 to 2 years.   Lipid and cholesterol check.** / Every 5 years beginning at age 20.   Clinical breast exam.** / Every year after age 40.   Mammogram.** / Every year beginning at age 40 and continuing for as  long as you are in good health. Consult with your caregiver.   Pap test.** / Every 3 years starting at age 30 through age 65 or 70 with a history of 3 consecutive normal Pap tests.   HPV screening.** / Every 3 years from ages 30 through ages 65 to 70 with a history of 3 consecutive normal Pap tests.   Fecal occult blood test (FOBT) of stool. / Every year beginning at age 50 and continuing until age 75. You may not need to do this test if you get a colonoscopy every 10 years.   Flexible sigmoidoscopy or colonoscopy.** / Every 5 years for a flexible sigmoidoscopy or every 10 years for a colonoscopy beginning at age 50 and continuing until age 75.   Hepatitis C blood test.** / For all people born from 1945 through 1965 and any individual with known risks for hepatitis C.   Skin self-exam. / Monthly.   Influenza immunization.** / Every year.   Pneumococcal polysaccharide immunization.** / 1 to 2 doses if you smoke cigarettes or if you have certain chronic medical conditions.   Tetanus, diphtheria, pertussis (Tdap, Td) immunization.** / A one-time dose of Tdap vaccine. After that, you need a Td booster dose every 10 years.   Measles, mumps, rubella (MMR) immunization. / You need at least 1 dose of MMR if you were born in 1957 or later. You may also need a second dose.   Varicella immunization.** / Consult your caregiver.   Meningococcal immunization.** / Consult your caregiver.   Hepatitis A immunization.** / Consult your caregiver. 2 doses, 6 to 18 months apart.   Hepatitis B immunization.** / Consult your caregiver. 3 doses, usually over 6 months.  Ages 65 and over  Blood pressure check.** / Every 1 to 2 years.   Lipid and cholesterol check.** / Every 5 years beginning at age 20.   Clinical breast exam.** / Every year after age 40.   Mammogram.** / Every year beginning at age 40 and continuing for as long as you are in good health. Consult with your caregiver.   Pap test.** /  Every 3 years starting at age 30 through age 65 or 70 with a 3 consecutive normal Pap tests. Testing can be stopped between 65 and 70 with 3 consecutive normal Pap tests and no abnormal Pap or HPV tests in the past 10 years.   HPV screening.** / Every 3 years from ages 30 through ages 65 or 70 with a history of 3 consecutive normal Pap tests. Testing can be stopped between 65 and 70 with 3 consecutive normal Pap tests and no abnormal Pap or HPV tests in the past 10 years.   Fecal occult blood test (FOBT) of stool. / Every year beginning at age 50 and continuing until age 75. You may not need to do this test if you get a colonoscopy every 10 years.   Flexible sigmoidoscopy or colonoscopy.** / Every 5 years for a flexible sigmoidoscopy or every 10 years for a colonoscopy beginning at age 50 and continuing until age 75.   Hepatitis   C blood test.** / For all people born from 1945 through 1965 and any individual with known risks for hepatitis C.   Osteoporosis screening.** / A one-time screening for women ages 65 and over and women at risk for fractures or osteoporosis.   Skin self-exam. / Monthly.   Influenza immunization.** / Every year.   Pneumococcal polysaccharide immunization.** / 1 dose at age 65 (or older) if you have never been vaccinated.   Tetanus, diphtheria, pertussis (Tdap, Td) immunization. / A one-time dose of Tdap vaccine if you are over 65 and have contact with an infant, are a healthcare worker, or simply want to be protected from whooping cough. After that, you need a Td booster dose every 10 years.   Varicella immunization.** / Consult your caregiver.   Meningococcal immunization.** / Consult your caregiver.   Hepatitis A immunization.** / Consult your caregiver. 2 doses, 6 to 18 months apart.   Hepatitis B immunization.** / Check with your caregiver. 3 doses, usually over 6 months.  ** Family history and personal history of risk and conditions may change your caregiver's  recommendations. Document Released: 11/29/2001 Document Revised: 09/22/2011 Document Reviewed: 02/28/2011 [employer] Patient Information 2012 [employer]. 

## 2012-02-29 ENCOUNTER — Other Ambulatory Visit (INDEPENDENT_AMBULATORY_CARE_PROVIDER_SITE_OTHER): Payer: Medicare Other

## 2012-02-29 DIAGNOSIS — I10 Essential (primary) hypertension: Secondary | ICD-10-CM

## 2012-02-29 DIAGNOSIS — E039 Hypothyroidism, unspecified: Secondary | ICD-10-CM

## 2012-02-29 LAB — LIPID PANEL
Cholesterol: 191 mg/dL (ref 0–200)
HDL: 44.1 mg/dL (ref >39.00)
Triglycerides: 145 mg/dL (ref 0.0–149.0)

## 2012-02-29 LAB — BASIC METABOLIC PANEL
BUN: 18 mg/dL (ref 6–23)
Calcium: 9.3 mg/dL (ref 8.4–10.5)
Creatinine, Ser: 1 mg/dL (ref 0.4–1.2)
GFR: 55.11 mL/min — ABNORMAL LOW (ref >60.00)
Glucose, Bld: 68 mg/dL — ABNORMAL LOW (ref 70–99)
Sodium: 140 mEq/L (ref 135–145)

## 2012-02-29 LAB — URINALYSIS
Ketones, ur: NEGATIVE
Leukocytes, UA: NEGATIVE
Nitrite: NEGATIVE
Specific Gravity, Urine: 1.025 (ref 1.000–1.030)
Urobilinogen, UA: 0.2 (ref 0.0–1.0)
pH: 6 (ref 5.0–8.0)

## 2012-02-29 LAB — CBC WITH DIFFERENTIAL/PLATELET
Eosinophils Relative: 4.8 % (ref 0.0–5.0)
Hemoglobin: 13.7 g/dL (ref 12.0–15.0)
Lymphs Abs: 1.9 10*3/uL (ref 0.7–4.0)
MCHC: 32.9 g/dL (ref 30.0–36.0)
Monocytes Absolute: 0.7 10*3/uL (ref 0.1–1.0)
Neutrophils Relative %: 49.9 % (ref 43.0–77.0)
RBC: 4.39 Mil/uL (ref 3.87–5.11)
RDW: 14 % (ref 11.5–14.6)

## 2012-02-29 LAB — HEPATIC FUNCTION PANEL
Albumin: 3.9 g/dL (ref 3.5–5.2)
Total Protein: 6.8 g/dL (ref 6.0–8.3)

## 2012-02-29 NOTE — Progress Notes (Signed)
Labs only

## 2012-03-01 ENCOUNTER — Other Ambulatory Visit: Payer: Medicare Other

## 2012-04-24 ENCOUNTER — Other Ambulatory Visit: Payer: Self-pay | Admitting: Family Medicine

## 2012-06-22 ENCOUNTER — Other Ambulatory Visit: Payer: Self-pay | Admitting: Neurology

## 2012-06-22 DIAGNOSIS — G3184 Mild cognitive impairment, so stated: Secondary | ICD-10-CM

## 2012-06-25 ENCOUNTER — Ambulatory Visit
Admission: RE | Admit: 2012-06-25 | Discharge: 2012-06-25 | Disposition: A | Discharge status: Home / Self Care | Payer: Medicare Other | Source: Ambulatory Visit | Attending: Neurology | Admitting: Neurology

## 2012-06-25 DIAGNOSIS — G3184 Mild cognitive impairment, so stated: Secondary | ICD-10-CM

## 2012-08-20 ENCOUNTER — Encounter: Payer: Self-pay | Admitting: Family Medicine

## 2012-08-20 ENCOUNTER — Ambulatory Visit (INDEPENDENT_AMBULATORY_CARE_PROVIDER_SITE_OTHER): Payer: Medicare Other | Admitting: Family Medicine

## 2012-08-20 VITALS — BP 140/82 | HR 93 | Temp 97.9°F | Wt 165.8 lb

## 2012-08-20 DIAGNOSIS — R32 Unspecified urinary incontinence: Secondary | ICD-10-CM

## 2012-08-20 DIAGNOSIS — E039 Hypothyroidism, unspecified: Secondary | ICD-10-CM

## 2012-08-20 DIAGNOSIS — I10 Essential (primary) hypertension: Secondary | ICD-10-CM

## 2012-08-20 MED ORDER — SOLIFENACIN SUCCINATE 5 MG PO TABS: 5.0000 mg | ORAL_TABLET | Freq: Every day | ORAL | Status: DC

## 2012-08-20 NOTE — Patient Instructions (Signed)

## 2012-08-20 NOTE — Progress Notes (Signed)
  Subjective:   Assessment:       Subjective:    Patient here for follow-up of elevated blood pressure.  She is exercising and is adherent to a low-salt diet.  Blood pressure is well controlled at home. Cardiac symptoms: none. Patient denies: chest pain, chest pressure/discomfort, claudication, dyspnea, exertional chest pressure/discomfort, fatigue, irregular heart beat, near-syncope, orthopnea, palpitations, paroxysmal nocturnal dyspnea, syncope and tachypnea. Cardiovascular risk factors: advanced age (older than 17 for men, 64 for women) and hypertension. Use of agents associated with hypertension: none. History of target organ damage: none.  The following portions of the patient's history were reviewed and updated as appropriate: allergies, current medications, past family history, past medical history, past social history, past surgical history and problem list.  Review of Systems Pertinent items are noted in HPI.     Objective:    BP 140/82  Pulse 93  Temp 97.9 F (36.6 C) (Oral)  Wt 165 lb 12.8 oz (75.206 kg)  SpO2 96% General appearance: alert, cooperative, appears stated age and no distress Ears: normal TM's and external ear canals both ears Nose: Nares normal. Septum midline. Mucosa normal. No drainage or sinus tenderness. Throat: lips, mucosa, and tongue normal; teeth and gums normal Neck: no adenopathy, no carotid bruit, no JVD, supple, symmetrical, trachea midline and thyroid not enlarged, symmetric, no tenderness/mass/nodules Lungs: clear to auscultation bilaterally Heart: S1, S2 normal   ext-- no cce Assessment:    Hypertension, normal blood pressure . Evidence of target organ damage: stroke.   hypothyroid---cont meds Incontinence-- vesicare samples   Plan:    Medication: no change. Dietary sodium restriction. Regular aerobic exercise. Follow up: 6 months and as needed.

## 2013-02-18 ENCOUNTER — Ambulatory Visit (INDEPENDENT_AMBULATORY_CARE_PROVIDER_SITE_OTHER): Payer: Medicare Other | Admitting: Family Medicine

## 2013-02-18 ENCOUNTER — Encounter: Payer: Self-pay | Admitting: Family Medicine

## 2013-02-18 ENCOUNTER — Other Ambulatory Visit: Payer: Self-pay | Admitting: Family Medicine

## 2013-02-18 VITALS — BP 150/70 | HR 80 | Temp 97.7°F | Ht 65.25 in | Wt 163.8 lb

## 2013-02-18 DIAGNOSIS — I10 Essential (primary) hypertension: Secondary | ICD-10-CM

## 2013-02-18 DIAGNOSIS — R32 Unspecified urinary incontinence: Secondary | ICD-10-CM

## 2013-02-18 DIAGNOSIS — E039 Hypothyroidism, unspecified: Secondary | ICD-10-CM

## 2013-02-18 DIAGNOSIS — R609 Edema, unspecified: Secondary | ICD-10-CM

## 2013-02-18 DIAGNOSIS — Z0000 Encounter for general adult medical examination without abnormal findings: Secondary | ICD-10-CM

## 2013-02-18 LAB — CBC WITH DIFFERENTIAL/PLATELET
Eosinophils Absolute: 0.1 10*3/uL (ref 0.0–0.7)
HCT: 39.2 % (ref 36.0–46.0)
Lymphs Abs: 1.5 10*3/uL (ref 0.7–4.0)
MCHC: 33.7 g/dL (ref 30.0–36.0)
MCV: 95.2 fl (ref 78.0–100.0)
Monocytes Absolute: 0.8 10*3/uL (ref 0.1–1.0)
Neutrophils Relative %: 62 % (ref 43.0–77.0)
Platelets: 230 10*3/uL (ref 150.0–400.0)

## 2013-02-18 MED ORDER — HYDROCHLOROTHIAZIDE 25 MG PO TABS: 25.0000 mg | ORAL_TABLET | Freq: Every day | ORAL | Status: DC

## 2013-02-18 MED ORDER — SOLIFENACIN SUCCINATE 5 MG PO TABS: 5.0000 mg | ORAL_TABLET | Freq: Every day | ORAL | Status: DC

## 2013-02-18 NOTE — Progress Notes (Signed)
Subjective:    Belinda Anderson is a 79 y.o. female who presents for Medicare Annual/Subsequent preventive examination.  Preventive Screening-Counseling & Management  Tobacco History  Smoking status  . Never Smoker   Smokeless tobacco  . Not on file     Problems Prior to Visit 1. Nothing new  Current Problems (verified) Patient Active Problem List   Diagnosis Date Noted  . LEG EDEMA, RIGHT 07/06/2010  . OTHER DRUG ALLERGY 07/06/2010  . DIARRHEA 02/12/2010  . HEMATURIA, HX OF 11/24/2009  . ANEURYSM, HX OF 08/03/2009  . B12 DEFICIENCY 07/24/2009  . 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 SYNCOPE 07/20/2009  . DIZZINESS 07/20/2009  . 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 UNSPECIFIED HYPOTHYROIDISM 12/14/2007  . HYPERTENSION 12/14/2007  . DIVERTICULITIS, HX OF 12/14/2007  . GOITER 01/30/2007  . HYPERTENSION, BENIGN 01/30/2007  . UTERINE POLYP 01/30/2007  . THYROIDECTOMY, HX OF 01/30/2007    Medications Prior to Visit Current Outpatient Prescriptions on File Prior to Visit  Medication Sig Dispense Refill  . aspirin EC 325 MG EC tablet Take 325 mg by mouth daily.        . Cyanocobalamin (VITAMIN B-12 PO) Take by mouth daily.        Marland Kitchen levothyroxine (SYNTHROID, LEVOTHROID) 100 MCG tablet take 1 tablet by mouth once daily  90 tablet  3  . losartan (COZAAR) 100 MG tablet Take 1 tablet (100 mg total) by mouth daily.  90 tablet  3   No current facility-administered medications on file prior to visit.    Current Medications (verified) Current Outpatient Prescriptions  Medication Sig Dispense Refill  . aspirin EC 325 MG EC tablet Take 325 mg by mouth daily.        . Cyanocobalamin (VITAMIN B-12 PO) Take by mouth daily.        Marland Kitchen levothyroxine (SYNTHROID, LEVOTHROID) 100 MCG tablet take 1 tablet by mouth once daily  90 tablet  3  . losartan (COZAAR) 100 MG tablet Take 1 tablet (100 mg total) by mouth daily.  90 tablet  3  .  solifenacin (VESICARE) 5 MG tablet Take 1 tablet (5 mg total) by mouth daily.       No current facility-administered medications for this visit.     Allergies (verified) Review of patient's allergies indicates no known allergies.   PAST HISTORY  Family History Family History  Problem Relation Age of Onset  . Coronary artery disease    . Diabetes Brother     DI  . Arthritis Mother   . Heart disease Mother   . Heart disease Father 58    MI    Social History History  Substance Use Topics  . Smoking status: Never Smoker   . Smokeless tobacco: Not on file  . Alcohol Use: No     Are there smokers in your home (other than you)? No  Risk Factors Current exercise habits: yard work  Dietary issues discussed: na   Cardiac risk factors: advanced age (older than 60 for men, 67 for women) and hypertension.  Depression Screen (Note: if answer to either of the following is "Yes", a more complete depression screening is indicated)   Over the past two weeks, have you felt down, depressed or hopeless? No  Over the past two weeks, have you felt little interest or pleasure in doing things? No  Have you lost interest or pleasure in daily life? No  Do you often feel hopeless? No  Do you cry easily over simple problems? No  Activities of Daily Living In your present state of health, do you have any difficulty performing the following activities?:  Driving? No Managing money?  No Feeding yourself? No Getting from bed to chair? No Climbing a flight of stairs? No Preparing food and eating?: No Bathing or showering? No Getting dressed: No Getting to the toilet? No Using the toilet:No Moving around from place to place: No In the past year have you fallen or had a near fall?:No   Are you sexually active?  No  Do you have more than one partner?  No  Hearing Difficulties: No Do you often ask people to speak up or repeat themselves? No Do you experience ringing or noises in your  ears? No Do you have difficulty understanding soft or whispered voices? No   Do you feel that you have a problem with memory? No  Do you often misplace items? No  Do you feel safe at home?  Yes  Cognitive Testing  Alert? Yes  Normal Appearance?Yes  Oriented to person? Yes  Place? Yes   Time? Yes  Recall of three objects?  Yes  Can perform simple calculations? Yes  Displays appropriate judgment?Yes  Can read the correct time from a watch face?Yes   Advanced Directives have been discussed with the patient? Yes  List the Names of Other Physician/Practitioners you currently use: 1. opth--? 2  Dentist-? 3  Neuro--love 4  Gyn-- neal   Indicate any recent Medical Services you may have received from other than [facility] providers in the past year (date may be approximate).  Immunization History  Administered Date(s) Administered  . Influenza Whole 08/06/2008, 07/20/2009, 07/06/2010  . Pneumococcal Polysaccharide 07/31/2000    Screening Tests Health Maintenance  Topic Date Due  . Tetanus/tdap  05/12/1951  . Pap Smear  04/17/2012  . Mammogram  03/28/2013  . Influenza Vaccine  06/17/2013  . Colonoscopy  01/30/2017  . Pneumococcal Polysaccharide Vaccine Age 55 And Over  Completed  . Zostavax  Addressed    All answers were reviewed with the patient and necessary referrals were made:  Loreen Freud, DO   02/18/2013   History reviewed:  She  has a past medical history of Hypertension; Thyroid disease; and Arthritis. She  does not have any pertinent problems on file. She  has past surgical history that includes Eye surgery. Her family history includes Arthritis in her mother; Coronary artery disease in an unspecified family member; Diabetes in her brother; Heart disease in her mother; and Heart disease (age of onset: 19) in her father. She  reports that she has never smoked. She does not have any smokeless tobacco history on file. She reports that she does not drink alcohol or use  illicit drugs. She has a current medication list which includes the following prescription(s): aspirin ec, cyanocobalamin, levothyroxine, losartan, and solifenacin. Current Outpatient Prescriptions on File Prior to Visit  Medication Sig Dispense Refill  . aspirin EC 325 MG EC tablet Take 325 mg by mouth daily.        . Cyanocobalamin (VITAMIN B-12 PO) Take by mouth daily.        Marland Kitchen levothyroxine (SYNTHROID, LEVOTHROID) 100 MCG tablet take 1 tablet by mouth once daily  90 tablet  3  . losartan (COZAAR) 100 MG tablet Take 1 tablet (100 mg total) by mouth daily.  90 tablet  3   No current facility-administered medications on file prior to visit.   She has  No Known Allergies.  Review of Systems  Review of Systems  Constitutional: Negative for activity change, appetite change and fatigue.  HENT: Negative for hearing loss, congestion, tinnitus and ear discharge.   Eyes: Negative for visual disturbance (see optho q1y -- vision corrected to 20/20 with glasses).  Respiratory: Negative for cough, chest tightness and shortness of breath.   Cardiovascular: Negative for chest pain, palpitations and leg swelling.  Gastrointestinal: Negative for abdominal pain, diarrhea, constipation and abdominal distention.  Genitourinary: Negative for urgency, frequency, decreased urine volume and difficulty urinating.  Musculoskeletal: Negative for back pain, arthralgias and gait problem.  Skin: Negative for color change, pallor and rash.  Neurological: Negative for dizziness, light-headedness, numbness and headaches.  Hematological: Negative for adenopathy. Does not bruise/bleed easily.  Psychiatric/Behavioral: Negative for suicidal ideas, confusion, sleep disturbance, self-injury, dysphoric mood, decreased concentration and agitation.  Pt is able to read and write and can do all ADLs No risk for falling No abuse/ violence in home      Objective:     Vision by Snellen chart: opth  Body mass index is 27.06  kg/(m^2). BP 150/70  Pulse 80  Temp(Src) 97.7 F (36.5 C) (Oral)  Ht 5' 5.25" (1.657 m)  Wt 163 lb 12.8 oz (74.299 kg)  BMI 27.06 kg/m2  SpO2 98%  BP 150/70  Pulse 80  Temp(Src) 97.7 F (36.5 C) (Oral)  Ht 5' 5.25" (1.657 m)  Wt 163 lb 12.8 oz (74.299 kg)  BMI 27.06 kg/m2  SpO2 98% General appearance: alert, cooperative, appears stated age and no distress Head: Normocephalic, without obvious abnormality, atraumatic Eyes: conjunctivae/corneas clear. PERRL, EOM's intact. Fundi benign. Ears: normal TM's and external ear canals both ears Nose: Nares normal. Septum midline. Mucosa normal. No drainage or sinus tenderness. Throat: lips, mucosa, and tongue normal; teeth and gums normal Neck: no adenopathy, no carotid bruit, no JVD, supple, symmetrical, trachea midline and thyroid not enlarged, symmetric, no tenderness/mass/nodules Back: symmetric, no curvature. ROM normal. No CVA tenderness. Lungs: clear to auscultation bilaterally Breasts: deferred Heart: regular rate and rhythm, S1, S2 normal, no murmur, click, rub or gallop Abdomen: soft, non-tender; bowel sounds normal; no masses,  no organomegaly Pelvic: deferred---gym Extremities: extremities normal, atraumatic, no cyanosis or edema Pulses: 2+ and symmetric Skin: Skin color, texture, turgor normal. No rashes or lesions Lymph nodes: Cervical, supraclavicular, and axillary nodes normal. Neurologic: Alert and oriented X 3, normal strength and tone. Normal symmetric reflexes. Normal coordination and gait Psych-- no depression, no anxiety      Assessment:     cpe      Plan:     During the course of the visit the patient was educated and counseled about appropriate screening and preventive services including:    Pneumococcal vaccine   Screening mammography  Screening Pap smear and pelvic exam   Bone densitometry screening  Colorectal cancer screening  Glaucoma screening  Advanced directives: has an advanced  directive - a copy HAS NOT been provided.  Diet review for nutrition referral? Yes ____  Not Indicated ___x_   Patient Instructions (the written plan) was given to the patient.  Medicare Attestation I have personally reviewed: The patient's medical and social history Their use of alcohol, tobacco or illicit drugs Their current medications and supplements The patient's functional ability including ADLs,fall risks, home safety risks, cognitive, and hearing and visual impairment Diet and physical activities Evidence for depression or mood disorders  The patient's weight, height, BMI, and visual acuity have been recorded in  the chart.  I have made referrals, counseling, and provided education to the patient based on review of the above and I have provided the patient with a written personalized care plan for preventive services.     Loreen Freud, DO   02/18/2013

## 2013-02-18 NOTE — Patient Instructions (Addendum)
Preventive Care for Adults, Female A healthy lifestyle and preventive care can promote health and wellness. Preventive health guidelines for women include the following key practices.  A routine yearly physical is a good way to check with your caregiver about your health and preventive screening. It is a chance to share any concerns and updates on your health, and to receive a thorough exam.  Visit your dentist for a routine exam and preventive care every 6 months. Brush your teeth twice a day and floss once a day. Good oral hygiene prevents tooth decay and gum disease.  The frequency of eye exams is based on your age, health, family medical history, use of contact lenses, and other factors. Follow your caregiver's recommendations for frequency of eye exams.  Eat a healthy diet. Foods like vegetables, fruits, whole grains, low-fat dairy products, and lean protein foods contain the nutrients you need without too many calories. Decrease your intake of foods high in solid fats, added sugars, and salt. Eat the right amount of calories for you.Get information about a proper diet from your caregiver, if necessary.  Regular physical exercise is one of the most important things you can do for your health. Most adults should get at least 150 minutes of moderate-intensity exercise (any activity that increases your heart rate and causes you to sweat) each week. In addition, most adults need muscle-strengthening exercises on 2 or more days a week.  Maintain a healthy weight. The body mass index (BMI) is a screening tool to identify possible weight problems. It provides an estimate of body fat based on height and weight. Your caregiver can help determine your BMI, and can help you achieve or maintain a healthy weight.For adults 20 years and older:  A BMI below 18.5 is considered underweight.  A BMI of 18.5 to 24.9 is normal.  A BMI of 25 to 29.9 is considered overweight.  A BMI of 30 and above is  considered obese.  Maintain normal blood lipids and cholesterol levels by exercising and minimizing your intake of saturated fat. Eat a balanced diet with plenty of fruit and vegetables. Blood tests for lipids and cholesterol should begin at age 20 and be repeated every 5 years. If your lipid or cholesterol levels are high, you are over 50, or you are at high risk for heart disease, you may need your cholesterol levels checked more frequently.Ongoing high lipid and cholesterol levels should be treated with medicines if diet and exercise are not effective.  If you smoke, find out from your caregiver how to quit. If you do not use tobacco, do not start.  If you are pregnant, do not drink alcohol. If you are breastfeeding, be very cautious about drinking alcohol. If you are not pregnant and choose to drink alcohol, do not exceed 1 drink per day. One drink is considered to be 12 ounces (355 mL) of beer, 5 ounces (148 mL) of wine, or 1.5 ounces (44 mL) of liquor.  Avoid use of street drugs. Do not share needles with anyone. Ask for help if you need support or instructions about stopping the use of drugs.  High blood pressure causes heart disease and increases the risk of stroke. Your blood pressure should be checked at least every 1 to 2 years. Ongoing high blood pressure should be treated with medicines if weight loss and exercise are not effective.  If you are 55 to 77 years old, ask your caregiver if you should take aspirin to prevent strokes.  Diabetes   screening involves taking a blood sample to check your fasting blood sugar level. This should be done once every 3 years, after age 45, if you are within normal weight and without risk factors for diabetes. Testing should be considered at a younger age or be carried out more frequently if you are overweight and have at least 1 risk factor for diabetes.  Breast cancer screening is essential preventive care for women. You should practice "breast  self-awareness." This means understanding the normal appearance and feel of your breasts and may include breast self-examination. Any changes detected, no matter how small, should be reported to a caregiver. Women in their 20s and 30s should have a clinical breast exam (CBE) by a caregiver as part of a regular health exam every 1 to 3 years. After age 40, women should have a CBE every year. Starting at age 40, women should consider having a mammography (breast X-ray test) every year. Women who have a family history of breast cancer should talk to their caregiver about genetic screening. Women at a high risk of breast cancer should talk to their caregivers about having magnetic resonance imaging (MRI) and a mammography every year.  The Pap test is a screening test for cervical cancer. A Pap test can show cell changes on the cervix that might become cervical cancer if left untreated. A Pap test is a procedure in which cells are obtained and examined from the lower end of the uterus (cervix).  Women should have a Pap test starting at age 21.  Between ages 21 and 29, Pap tests should be repeated every 2 years.  Beginning at age 30, you should have a Pap test every 3 years as long as the past 3 Pap tests have been normal.  Some women have medical problems that increase the chance of getting cervical cancer. Talk to your caregiver about these problems. It is especially important to talk to your caregiver if a new problem develops soon after your last Pap test. In these cases, your caregiver may recommend more frequent screening and Pap tests.  The above recommendations are the same for women who have or have not gotten the vaccine for human papillomavirus (HPV).  If you had a hysterectomy for a problem that was not cancer or a condition that could lead to cancer, then you no longer need Pap tests. Even if you no longer need a Pap test, a regular exam is a good idea to make sure no other problems are  starting.  If you are between ages 65 and 70, and you have had normal Pap tests going back 10 years, you no longer need Pap tests. Even if you no longer need a Pap test, a regular exam is a good idea to make sure no other problems are starting.  If you have had past treatment for cervical cancer or a condition that could lead to cancer, you need Pap tests and screening for cancer for at least 20 years after your treatment.  If Pap tests have been discontinued, risk factors (such as a new sexual partner) need to be reassessed to determine if screening should be resumed.  The HPV test is an additional test that may be used for cervical cancer screening. The HPV test looks for the virus that can cause the cell changes on the cervix. The cells collected during the Pap test can be tested for HPV. The HPV test could be used to screen women aged 30 years and older, and should   be used in women of any age who have unclear Pap test results. After the age of 30, women should have HPV testing at the same frequency as a Pap test.  Colorectal cancer can be detected and often prevented. Most routine colorectal cancer screening begins at the age of 50 and continues through age 75. However, your caregiver may recommend screening at an earlier age if you have risk factors for colon cancer. On a yearly basis, your caregiver may provide home test kits to check for hidden blood in the stool. Use of a small camera at the end of a tube, to directly examine the colon (sigmoidoscopy or colonoscopy), can detect the earliest forms of colorectal cancer. Talk to your caregiver about this at age 50, when routine screening begins. Direct examination of the colon should be repeated every 5 to 10 years through age 75, unless early forms of pre-cancerous polyps or small growths are found.  Hepatitis C blood testing is recommended for all people born from 1945 through 1965 and any individual with known risks for hepatitis C.  Practice  safe sex. Use condoms and avoid high-risk sexual practices to reduce the spread of sexually transmitted infections (STIs). STIs include gonorrhea, chlamydia, syphilis, trichomonas, herpes, HPV, and human immunodeficiency virus (HIV). Herpes, HIV, and HPV are viral illnesses that have no cure. They can result in disability, cancer, and death. Sexually active women aged 25 and younger should be checked for chlamydia. Older women with new or multiple partners should also be tested for chlamydia. Testing for other STIs is recommended if you are sexually active and at increased risk.  Osteoporosis is a disease in which the bones lose minerals and strength with aging. This can result in serious bone fractures. The risk of osteoporosis can be identified using a bone density scan. Women ages 65 and over and women at risk for fractures or osteoporosis should discuss screening with their caregivers. Ask your caregiver whether you should take a calcium supplement or vitamin D to reduce the rate of osteoporosis.  Menopause can be associated with physical symptoms and risks. Hormone replacement therapy is available to decrease symptoms and risks. You should talk to your caregiver about whether hormone replacement therapy is right for you.  Use sunscreen with sun protection factor (SPF) of 30 or more. Apply sunscreen liberally and repeatedly throughout the day. You should seek shade when your shadow is shorter than you. Protect yourself by wearing long sleeves, pants, a wide-brimmed hat, and sunglasses year round, whenever you are outdoors.  Once a month, do a whole body skin exam, using a mirror to look at the skin on your back. Notify your caregiver of new moles, moles that have irregular borders, moles that are larger than a pencil eraser, or moles that have changed in shape or color.  Stay current with required immunizations.  Influenza. You need a dose every fall (or winter). The composition of the flu vaccine  changes each year, so being vaccinated once is not enough.  Pneumococcal polysaccharide. You need 1 to 2 doses if you smoke cigarettes or if you have certain chronic medical conditions. You need 1 dose at age 65 (or older) if you have never been vaccinated.  Tetanus, diphtheria, pertussis (Tdap, Td). Get 1 dose of Tdap vaccine if you are younger than age 65, are over 65 and have contact with an infant, are a healthcare worker, are pregnant, or simply want to be protected from whooping cough. After that, you need a Td   booster dose every 10 years. Consult your caregiver if you have not had at least 3 tetanus and diphtheria-containing shots sometime in your life or have a deep or dirty wound.  HPV. You need this vaccine if you are a woman age 26 or younger. The vaccine is given in 3 doses over 6 months.  Measles, mumps, rubella (MMR). You need at least 1 dose of MMR if you were born in 1957 or later. You may also need a second dose.  Meningococcal. If you are age 19 to 21 and a first-year college student living in a residence hall, or have one of several medical conditions, you need to get vaccinated against meningococcal disease. You may also need additional booster doses.  Zoster (shingles). If you are age 60 or older, you should get this vaccine.  Varicella (chickenpox). If you have never had chickenpox or you were vaccinated but received only 1 dose, talk to your caregiver to find out if you need this vaccine.  Hepatitis A. You need this vaccine if you have a specific risk factor for hepatitis A virus infection or you simply wish to be protected from this disease. The vaccine is usually given as 2 doses, 6 to 18 months apart.  Hepatitis B. You need this vaccine if you have a specific risk factor for hepatitis B virus infection or you simply wish to be protected from this disease. The vaccine is given in 3 doses, usually over 6 months. Preventive Services / Frequency Ages 19 to 39  Blood  pressure check.** / Every 1 to 2 years.  Lipid and cholesterol check.** / Every 5 years beginning at age 20.  Clinical breast exam.** / Every 3 years for women in their 20s and 30s.  Pap test.** / Every 2 years from ages 21 through 29. Every 3 years starting at age 30 through age 65 or 70 with a history of 3 consecutive normal Pap tests.  HPV screening.** / Every 3 years from ages 30 through ages 65 to 70 with a history of 3 consecutive normal Pap tests.  Hepatitis C blood test.** / For any individual with known risks for hepatitis C.  Skin self-exam. / Monthly.  Influenza immunization.** / Every year.  Pneumococcal polysaccharide immunization.** / 1 to 2 doses if you smoke cigarettes or if you have certain chronic medical conditions.  Tetanus, diphtheria, pertussis (Tdap, Td) immunization. / A one-time dose of Tdap vaccine. After that, you need a Td booster dose every 10 years.  HPV immunization. / 3 doses over 6 months, if you are 26 and younger.  Measles, mumps, rubella (MMR) immunization. / You need at least 1 dose of MMR if you were born in 1957 or later. You may also need a second dose.  Meningococcal immunization. / 1 dose if you are age 19 to 21 and a first-year college student living in a residence hall, or have one of several medical conditions, you need to get vaccinated against meningococcal disease. You may also need additional booster doses.  Varicella immunization.** / Consult your caregiver.  Hepatitis A immunization.** / Consult your caregiver. 2 doses, 6 to 18 months apart.  Hepatitis B immunization.** / Consult your caregiver. 3 doses usually over 6 months. Ages 40 to 64  Blood pressure check.** / Every 1 to 2 years.  Lipid and cholesterol check.** / Every 5 years beginning at age 20.  Clinical breast exam.** / Every year after age 40.  Mammogram.** / Every year beginning at age 40   and continuing for as long as you are in good health. Consult with your  caregiver.  Pap test.** / Every 3 years starting at age 30 through age 65 or 70 with a history of 3 consecutive normal Pap tests.  HPV screening.** / Every 3 years from ages 30 through ages 65 to 70 with a history of 3 consecutive normal Pap tests.  Fecal occult blood test (FOBT) of stool. / Every year beginning at age 50 and continuing until age 75. You may not need to do this test if you get a colonoscopy every 10 years.  Flexible sigmoidoscopy or colonoscopy.** / Every 5 years for a flexible sigmoidoscopy or every 10 years for a colonoscopy beginning at age 50 and continuing until age 75.  Hepatitis C blood test.** / For all people born from 1945 through 1965 and any individual with known risks for hepatitis C.  Skin self-exam. / Monthly.  Influenza immunization.** / Every year.  Pneumococcal polysaccharide immunization.** / 1 to 2 doses if you smoke cigarettes or if you have certain chronic medical conditions.  Tetanus, diphtheria, pertussis (Tdap, Td) immunization.** / A one-time dose of Tdap vaccine. After that, you need a Td booster dose every 10 years.  Measles, mumps, rubella (MMR) immunization. / You need at least 1 dose of MMR if you were born in 1957 or later. You may also need a second dose.  Varicella immunization.** / Consult your caregiver.  Meningococcal immunization.** / Consult your caregiver.  Hepatitis A immunization.** / Consult your caregiver. 2 doses, 6 to 18 months apart.  Hepatitis B immunization.** / Consult your caregiver. 3 doses, usually over 6 months. Ages 65 and over  Blood pressure check.** / Every 1 to 2 years.  Lipid and cholesterol check.** / Every 5 years beginning at age 20.  Clinical breast exam.** / Every year after age 40.  Mammogram.** / Every year beginning at age 40 and continuing for as long as you are in good health. Consult with your caregiver.  Pap test.** / Every 3 years starting at age 30 through age 65 or 70 with a 3  consecutive normal Pap tests. Testing can be stopped between 65 and 70 with 3 consecutive normal Pap tests and no abnormal Pap or HPV tests in the past 10 years.  HPV screening.** / Every 3 years from ages 30 through ages 65 or 70 with a history of 3 consecutive normal Pap tests. Testing can be stopped between 65 and 70 with 3 consecutive normal Pap tests and no abnormal Pap or HPV tests in the past 10 years.  Fecal occult blood test (FOBT) of stool. / Every year beginning at age 50 and continuing until age 75. You may not need to do this test if you get a colonoscopy every 10 years.  Flexible sigmoidoscopy or colonoscopy.** / Every 5 years for a flexible sigmoidoscopy or every 10 years for a colonoscopy beginning at age 50 and continuing until age 75.  Hepatitis C blood test.** / For all people born from 1945 through 1965 and any individual with known risks for hepatitis C.  Osteoporosis screening.** / A one-time screening for women ages 65 and over and women at risk for fractures or osteoporosis.  Skin self-exam. / Monthly.  Influenza immunization.** / Every year.  Pneumococcal polysaccharide immunization.** / 1 dose at age 65 (or older) if you have never been vaccinated.  Tetanus, diphtheria, pertussis (Tdap, Td) immunization. / A one-time dose of Tdap vaccine if you are over   65 and have contact with an infant, are a healthcare worker, or simply want to be protected from whooping cough. After that, you need a Td booster dose every 10 years.  Varicella immunization.** / Consult your caregiver.  Meningococcal immunization.** / Consult your caregiver.  Hepatitis A immunization.** / Consult your caregiver. 2 doses, 6 to 18 months apart.  Hepatitis B immunization.** / Check with your caregiver. 3 doses, usually over 6 months. ** Family history and personal history of risk and conditions may change your caregiver's recommendations. Document Released: 11/29/2001 Document Revised: 12/26/2011  Document Reviewed: 02/28/2011 [employer] Patient Information 2013 [employer].  

## 2013-02-18 NOTE — Assessment & Plan Note (Signed)
Stable Cont meds 

## 2013-02-18 NOTE — Assessment & Plan Note (Signed)
Check labs 

## 2013-02-19 LAB — BASIC METABOLIC PANEL
BUN: 18 mg/dL (ref 6–23)
CO2: 21 mEq/L (ref 19–32)
Chloride: 110 mEq/L (ref 96–112)
Creatinine, Ser: 1.1 mg/dL (ref 0.4–1.2)
Glucose, Bld: 83 mg/dL (ref 70–99)
Potassium: 4.2 mEq/L (ref 3.5–5.1)

## 2013-02-19 LAB — LIPID PANEL
Cholesterol: 189 mg/dL (ref 0–200)
HDL: 43.9 mg/dL (ref >39.00)
LDL Cholesterol: 128 mg/dL — ABNORMAL HIGH (ref 0–99)
Total CHOL/HDL Ratio: 4
Triglycerides: 84 mg/dL (ref 0.0–149.0)

## 2013-02-19 LAB — HEPATIC FUNCTION PANEL
ALT: 11 U/L (ref 0–35)
Bilirubin, Direct: 0.1 mg/dL (ref 0.0–0.3)
Total Bilirubin: 0.8 mg/dL (ref 0.3–1.2)
Total Protein: 6.8 g/dL (ref 6.0–8.3)

## 2013-02-20 LAB — POCT URINALYSIS DIPSTICK
Bilirubin, UA: NEGATIVE
Blood, UA: NEGATIVE
Glucose, UA: NEGATIVE
Nitrite, UA: NEGATIVE
Spec Grav, UA: 1.02
pH, UA: 6

## 2013-04-08 ENCOUNTER — Encounter: Payer: Self-pay | Admitting: Family Medicine

## 2013-04-08 ENCOUNTER — Ambulatory Visit (INDEPENDENT_AMBULATORY_CARE_PROVIDER_SITE_OTHER): Payer: Medicare Other | Admitting: Family Medicine

## 2013-04-08 VITALS — BP 138/82 | HR 72 | Temp 98.4°F | Wt 158.2 lb

## 2013-04-08 DIAGNOSIS — I609 Nontraumatic subarachnoid hemorrhage, unspecified: Secondary | ICD-10-CM

## 2013-04-08 DIAGNOSIS — I607 Nontraumatic subarachnoid hemorrhage from unspecified intracranial artery: Secondary | ICD-10-CM

## 2013-04-08 DIAGNOSIS — R51 Headache: Secondary | ICD-10-CM

## 2013-04-08 DIAGNOSIS — R519 Headache, unspecified: Secondary | ICD-10-CM

## 2013-04-08 DIAGNOSIS — I10 Essential (primary) hypertension: Secondary | ICD-10-CM

## 2013-04-08 LAB — BASIC METABOLIC PANEL WITH GFR
BUN: 27 mg/dL — ABNORMAL HIGH (ref 6–23)
CO2: 30 meq/L (ref 19–32)
Calcium: 9.3 mg/dL (ref 8.4–10.5)
Chloride: 100 meq/L (ref 96–112)
Creatinine, Ser: 1.4 mg/dL — ABNORMAL HIGH (ref 0.4–1.2)
GFR: 39.54 mL/min — ABNORMAL LOW
Glucose, Bld: 159 mg/dL — ABNORMAL HIGH (ref 70–99)
Potassium: 3.2 meq/L — ABNORMAL LOW (ref 3.5–5.1)
Sodium: 138 meq/L (ref 135–145)

## 2013-04-08 LAB — CBC WITH DIFFERENTIAL/PLATELET
Basophils Absolute: 0 10*3/uL (ref 0.0–0.1)
Eosinophils Absolute: 0.1 10*3/uL (ref 0.0–0.7)
HCT: 40.7 % (ref 36.0–46.0)
Lymphs Abs: 1.9 10*3/uL (ref 0.7–4.0)
Monocytes Absolute: 0.5 10*3/uL (ref 0.1–1.0)
Monocytes Relative: 8.2 % (ref 3.0–12.0)
Platelets: 220 10*3/uL (ref 150.0–400.0)
RDW: 13.5 % (ref 11.5–14.6)

## 2013-04-08 LAB — HEPATIC FUNCTION PANEL
AST: 20 U/L (ref 0–37)
Total Bilirubin: 0.5 mg/dL (ref 0.3–1.2)

## 2013-04-08 LAB — SEDIMENTATION RATE: Sed Rate: 24 mm/h — ABNORMAL HIGH (ref 0–22)

## 2013-04-08 MED ORDER — TRAMADOL HCL 50 MG PO TABS: 50.0000 mg | ORAL_TABLET | Freq: Three times a day (TID) | ORAL | Status: DC | PRN

## 2013-04-08 MED ORDER — CYCLOBENZAPRINE HCL 5 MG PO TABS: 5.0000 mg | ORAL_TABLET | Freq: Three times a day (TID) | ORAL | Status: DC | PRN

## 2013-04-08 NOTE — Patient Instructions (Addendum)

## 2013-04-08 NOTE — Progress Notes (Signed)
  Subjective:    Belinda Anderson is a 79 y.o. female who presents for evaluation of headache. Symptoms began about 1 week ago. Generally, the headaches last about all the time. The headaches are usually poorly described and are located in back of head.  Recently, the headaches have been the same. Work attendance or other daily activitiesNA. Precipitating factors include: none which have been determined. The headaches are usually not preceded by an aura. Associated neurologic symptoms: some disorientation Saturday. The patient denies depression, dizziness, loss of balance, muscle weakness, numbness of extremities, speech difficulties, vision problems, vomiting in the early morning and worsening school/work performance. Home treatment has included acetaminophen and ibuprofen and resting with some improvement. Other history includes: nothing pertinent. Family history includes no known family members with significant headaches.  The following portions of the patient's history were reviewed and updated as appropriate: allergies, current medications, past family history, past medical history, past social history, past surgical history and problem list.  Review of Systems Pertinent items are noted in HPI.    Objective:    BP 138/82  Pulse 72  Temp(Src) 98.4 F (36.9 C) (Oral)  Wt 158 lb 3.2 oz (71.759 kg)  BMI 26.14 kg/m2  SpO2 98% General appearance: alert, cooperative, appears stated age and no distress Eyes: negative findings: lids and lashes normal and pupils equal, round, reactive to light and accomodation Ears: normal TM's and external ear canals both ears Throat: lips, mucosa, and tongue normal; teeth and gums normal Neck: no adenopathy, no carotid bruit, no JVD, supple, symmetrical, trachea midline and thyroid not enlarged, symmetric, no tenderness/mass/nodules Lungs: clear to auscultation bilaterally Heart: S1, S2 normal Extremities: extremities normal, atraumatic, no cyanosis or  edema Neurologic: Alert and oriented X 3, normal strength and tone. Normal symmetric reflexes. Normal coordination and gait    Assessment:    headache --with ? Hx mental status change--- normal today    Hx aneurysm in past Plan:    Lie in darkened room and apply cold packs as needed for pain. Prophylactic therapy: ultram and flexeril due to high frequency of pain. Side effect profile discussed in detail. Neurodiagnostic workup: CT. will refer to neuro if ct and labs normal  If labs and ct normal and symptms persist-- refer to neuro

## 2013-04-10 ENCOUNTER — Ambulatory Visit (HOSPITAL_COMMUNITY)
Admission: RE | Admit: 2013-04-10 | Discharge: 2013-04-10 | Disposition: A | Discharge status: Home / Self Care | Payer: Medicare Other | Source: Ambulatory Visit | Attending: Family Medicine | Admitting: Family Medicine

## 2013-04-10 DIAGNOSIS — G319 Degenerative disease of nervous system, unspecified: Secondary | ICD-10-CM | POA: Insufficient documentation

## 2013-04-10 DIAGNOSIS — Z8679 Personal history of other diseases of the circulatory system: Secondary | ICD-10-CM | POA: Insufficient documentation

## 2013-04-10 DIAGNOSIS — I6789 Other cerebrovascular disease: Secondary | ICD-10-CM | POA: Insufficient documentation

## 2013-04-10 DIAGNOSIS — R42 Dizziness and giddiness: Secondary | ICD-10-CM | POA: Insufficient documentation

## 2013-04-10 DIAGNOSIS — R4182 Altered mental status, unspecified: Secondary | ICD-10-CM | POA: Insufficient documentation

## 2013-04-10 DIAGNOSIS — R519 Headache, unspecified: Secondary | ICD-10-CM

## 2013-04-10 DIAGNOSIS — R51 Headache: Secondary | ICD-10-CM | POA: Insufficient documentation

## 2013-04-10 MED ORDER — IOHEXOL 300 MG/ML  SOLN
100.0000 mL | Freq: Once | INTRAMUSCULAR | Status: AC | PRN
  Administered 2013-04-10: 80 mL via INTRAVENOUS

## 2013-04-16 ENCOUNTER — Other Ambulatory Visit: Payer: Self-pay

## 2013-04-16 MED ORDER — POTASSIUM CHLORIDE CRYS ER 20 MEQ PO TBCR: 20.0000 meq | EXTENDED_RELEASE_TABLET | Freq: Every day | ORAL | Status: DC

## 2013-04-25 ENCOUNTER — Encounter: Payer: Self-pay | Admitting: Neurology

## 2013-05-01 ENCOUNTER — Other Ambulatory Visit: Payer: Self-pay | Admitting: Family Medicine

## 2013-06-18 ENCOUNTER — Encounter: Payer: Self-pay | Admitting: Lab

## 2013-06-19 ENCOUNTER — Encounter: Payer: Self-pay | Admitting: Family Medicine

## 2013-06-19 ENCOUNTER — Ambulatory Visit (INDEPENDENT_AMBULATORY_CARE_PROVIDER_SITE_OTHER): Payer: Medicare Other | Admitting: Family Medicine

## 2013-06-19 VITALS — BP 132/70 | HR 90 | Temp 97.8°F | Wt 160.0 lb

## 2013-06-19 DIAGNOSIS — R42 Dizziness and giddiness: Secondary | ICD-10-CM

## 2013-06-19 DIAGNOSIS — I10 Essential (primary) hypertension: Secondary | ICD-10-CM

## 2013-06-19 DIAGNOSIS — R51 Headache: Secondary | ICD-10-CM

## 2013-06-19 DIAGNOSIS — R32 Unspecified urinary incontinence: Secondary | ICD-10-CM

## 2013-06-19 LAB — BASIC METABOLIC PANEL
BUN: 21 mg/dL (ref 6–23)
CO2: 28 mEq/L (ref 19–32)
Calcium: 9.4 mg/dL (ref 8.4–10.5)
Creatinine, Ser: 1.5 mg/dL — ABNORMAL HIGH (ref 0.4–1.2)
GFR: 36.15 mL/min — ABNORMAL LOW (ref >60.00)
Glucose, Bld: 68 mg/dL — ABNORMAL LOW (ref 70–99)
Sodium: 139 mEq/L (ref 135–145)

## 2013-06-19 MED ORDER — SOLIFENACIN SUCCINATE 10 MG PO TABS: 10.0000 mg | ORAL_TABLET | Freq: Every day | ORAL | Status: DC

## 2013-06-19 NOTE — Progress Notes (Signed)
  Subjective:     Belinda Anderson is a 79 y.o. female who presents for evaluation of dizziness. The symptoms started 3 months ago and are ongoing. The attacks occur several times per week and last 1 hour. Positions that worsen symptoms: turning head. Previous workup/treatments: CT scan of brain. Associated ear symptoms: none. Associated CNS symptoms: confusion and headaches. Recent infections: none. Head trauma: denied. Drug ingestion: none. Noise exposure: no occupational exposure. Family history: non-contributory.  The following portions of the patient's history were reviewed and updated as appropriate: allergies, current medications, past family history, past medical history, past social history, past surgical history and problem list.  Review of Systems Pertinent items are noted in HPI.    Objective:    BP 132/70  Pulse 90  Temp(Src) 97.8 F (36.6 C) (Oral)  Wt 160 lb (72.576 kg)  BMI 26.43 kg/m2  SpO2 99% General appearance: alert, cooperative, appears stated age and no distress Head: Normocephalic, without obvious abnormality, atraumatic Eyes: negative findings: lids and lashes normal, conjunctivae and sclerae normal and pupils equal, round, reactive to light and accomodation Ears: normal TM's and external ear canals both ears Neck: no adenopathy, no carotid bruit, no JVD, supple, symmetrical, trachea midline and thyroid not enlarged, symmetric, no tenderness/mass/nodules Lungs: clear to auscultation bilaterally Heart: S1, S2 normal      EKG: Assessment:    Vertigo and headaches--recurrent    Plan:    Meclizine per medication orders.  Ct report reviewed---MRI / MRA ordered Recheck labs

## 2013-06-19 NOTE — Patient Instructions (Addendum)

## 2013-06-19 NOTE — Assessment & Plan Note (Signed)
vesicare 10 mg refilled

## 2013-06-27 ENCOUNTER — Ambulatory Visit
Admission: RE | Admit: 2013-06-27 | Discharge: 2013-06-27 | Disposition: A | Discharge status: Home / Self Care | Payer: Medicare Other | Source: Ambulatory Visit | Attending: Family Medicine | Admitting: Family Medicine

## 2013-06-27 ENCOUNTER — Encounter (HOSPITAL_COMMUNITY): Payer: Self-pay | Admitting: Emergency Medicine

## 2013-06-27 ENCOUNTER — Other Ambulatory Visit: Payer: Self-pay | Admitting: Family Medicine

## 2013-06-27 ENCOUNTER — Emergency Department (HOSPITAL_COMMUNITY)
Admission: EM | Admit: 2013-06-27 | Discharge: 2013-06-27 | Disposition: A | Discharge status: Home / Self Care | Payer: Medicare Other | Attending: Emergency Medicine | Admitting: Emergency Medicine

## 2013-06-27 DIAGNOSIS — R42 Dizziness and giddiness: Secondary | ICD-10-CM

## 2013-06-27 DIAGNOSIS — E079 Disorder of thyroid, unspecified: Secondary | ICD-10-CM | POA: Insufficient documentation

## 2013-06-27 DIAGNOSIS — I10 Essential (primary) hypertension: Secondary | ICD-10-CM | POA: Insufficient documentation

## 2013-06-27 DIAGNOSIS — Z7982 Long term (current) use of aspirin: Secondary | ICD-10-CM | POA: Insufficient documentation

## 2013-06-27 DIAGNOSIS — M129 Arthropathy, unspecified: Secondary | ICD-10-CM | POA: Insufficient documentation

## 2013-06-27 DIAGNOSIS — I671 Cerebral aneurysm, nonruptured: Secondary | ICD-10-CM

## 2013-06-27 DIAGNOSIS — R51 Headache: Secondary | ICD-10-CM

## 2013-06-27 DIAGNOSIS — Z79899 Other long term (current) drug therapy: Secondary | ICD-10-CM | POA: Insufficient documentation

## 2013-06-27 NOTE — ED Notes (Signed)
Denies pain. States she has stiffness in neck and dizziness if she looks up.

## 2013-06-27 NOTE — ED Notes (Signed)
Pt. advised to go to ER after receiving result of CT scan - "  Aneurysm " done at [facility] this afternoon . Alert and oriented , speech clear /no facial asymmetry , equal strong grips , no arm drift /ambulatory.

## 2013-07-05 NOTE — ED Provider Notes (Signed)
CSN: [id number]     Arrival date & time 06/27/13  1940 History   First MD Initiated Contact with Patient 06/27/13 2005     Chief Complaint  Patient presents with  . Aneurysm   (Consider location/radiation/quality/duration/timing/severity/associated sxs/prior Treatment) HPI Comments: Patient referred to the ER by her primary doctor. Patient had been experiencing headaches and had outpatient imaging performed today. The results of her MRI revealed that she has an aneurysm, she is to go to the ER immediately by the ordering position. Patient reports that her headache has improved. She has no other neurologic symptoms.   Past Medical History  Diagnosis Date  . Hypertension   . Thyroid disease   . Arthritis    Past Surgical History  Procedure Laterality Date  . Eye surgery      lens implant   Family History  Problem Relation Age of Onset  . Coronary artery disease    . Diabetes Brother     DI  . Arthritis Mother   . Heart disease Mother   . Heart disease Father 27    MI   History  Substance Use Topics  . Smoking status: Never Smoker   . Smokeless tobacco: Not on file  . Alcohol Use: No   OB History   Grav Para Term Preterm Abortions TAB SAB Ect Mult Living                 Review of Systems  Neurological: Positive for headaches.  All other systems reviewed and are negative.    Allergies  Review of patient's allergies indicates no known allergies.  Home Medications   Current Outpatient Rx  Name  Route  Sig  Dispense  Refill  . aspirin EC 325 MG EC tablet   Oral   Take 325 mg by mouth daily.           . Cyanocobalamin (VITAMIN B-12 PO)   Oral   Take 1 tablet by mouth daily.          . cyclobenzaprine (FLEXERIL) 5 MG tablet   Oral   Take 1 tablet (5 mg total) by mouth 3 (three) times daily as needed for muscle spasms.   30 tablet   1   . hydrochlorothiazide (HYDRODIURIL) 25 MG tablet   Oral   Take 1 tablet (25 mg total) by mouth daily.   30 tablet   11   . levothyroxine (SYNTHROID, LEVOTHROID) 100 MCG tablet      take 1 tablet by mouth once daily   90 tablet   3   . losartan (COZAAR) 100 MG tablet   Oral   Take 1 tablet (100 mg total) by mouth daily.   90 tablet   3   . potassium chloride SA (K-DUR,KLOR-CON) 20 MEQ tablet   Oral   Take 1 tablet (20 mEq total) by mouth daily.   30 tablet   2   . traMADol (ULTRAM) 50 MG tablet   Oral   Take 1 tablet (50 mg total) by mouth every 8 (eight) hours as needed for pain.   30 tablet   0   . vitamin E (VITAMIN E) 1000 UNIT capsule   Oral   Take 2,000 Units by mouth daily.         . solifenacin (VESICARE) 10 MG tablet   Oral   Take 1 tablet (10 mg total) by mouth daily.   30 tablet   5    BP 137/46  Pulse 77  Temp(Src) 98.1 F (36.7 C) (Oral)  Resp 13  SpO2 98% Physical Exam  Constitutional: She is oriented to person, place, and time. She appears well-developed and well-nourished. No distress.  HENT:  Head: Normocephalic and atraumatic.  Right Ear: Hearing normal.  Left Ear: Hearing normal.  Nose: Nose normal.  Mouth/Throat: Oropharynx is clear and moist and mucous membranes are normal.  Eyes: Conjunctivae and EOM are normal. Pupils are equal, round, and reactive to light.  Neck: Normal range of motion. Neck supple.  Cardiovascular: Regular rhythm, S1 normal and S2 normal.  Exam reveals no gallop and no friction rub.   No murmur heard. Pulmonary/Chest: Effort normal and breath sounds normal. No respiratory distress. She exhibits no tenderness.  Abdominal: Soft. Normal appearance and bowel sounds are normal. There is no hepatosplenomegaly. There is no tenderness. There is no rebound, no guarding, no tenderness at McBurney's point and negative Murphy's sign. No hernia.  Musculoskeletal: Normal range of motion.  Neurological: She is alert and oriented to person, place, and time. She has normal strength. No cranial nerve deficit or sensory deficit. Coordination  normal. GCS eye subscore is 4. GCS verbal subscore is 5. GCS motor subscore is 6.  Skin: Skin is warm, dry and intact. No rash noted. No cyanosis.  Psychiatric: She has a normal mood and affect. Her speech is normal and behavior is normal. Thought content normal.    ED Course  Procedures (including critical care time) Labs Review Labs Reviewed - No data to display Imaging Review No results found.  MDM   1. Cerebral aneurysm    Patient presented to the ER for evaluation because she was diagnosed with an aneurysm that had evidence of thrombosis. Case was discussed with on-call neurosurgeon. Results of the MRI were discussed. He did not feel that there is any emergent intervention is necessary at this time. It was recommended that the patient call the office for followup. This was discussed with the patient and her family, they were in agreement with the plan.   Gilda Crease, MD 07/05/13 [phone]

## 2013-07-09 ENCOUNTER — Other Ambulatory Visit (HOSPITAL_COMMUNITY): Payer: Self-pay | Admitting: Interventional Radiology

## 2013-07-09 ENCOUNTER — Encounter (HOSPITAL_COMMUNITY): Payer: Self-pay | Admitting: Respiratory Therapy

## 2013-07-09 ENCOUNTER — Other Ambulatory Visit: Payer: Self-pay | Admitting: Radiology

## 2013-07-09 DIAGNOSIS — I671 Cerebral aneurysm, nonruptured: Secondary | ICD-10-CM

## 2013-07-10 ENCOUNTER — Ambulatory Visit (HOSPITAL_COMMUNITY)
Admission: RE | Admit: 2013-07-10 | Discharge: 2013-07-10 | Disposition: A | Discharge status: Home / Self Care | Payer: Medicare Other | Source: Ambulatory Visit | Attending: Interventional Radiology | Admitting: Interventional Radiology

## 2013-07-10 ENCOUNTER — Encounter (HOSPITAL_COMMUNITY): Payer: Self-pay

## 2013-07-10 ENCOUNTER — Other Ambulatory Visit (HOSPITAL_COMMUNITY): Payer: Self-pay | Admitting: Interventional Radiology

## 2013-07-10 ENCOUNTER — Ambulatory Visit: Payer: Medicare Other | Admitting: Neurology

## 2013-07-10 DIAGNOSIS — R51 Headache: Secondary | ICD-10-CM | POA: Insufficient documentation

## 2013-07-10 DIAGNOSIS — Z79899 Other long term (current) drug therapy: Secondary | ICD-10-CM | POA: Insufficient documentation

## 2013-07-10 DIAGNOSIS — I671 Cerebral aneurysm, nonruptured: Secondary | ICD-10-CM

## 2013-07-10 DIAGNOSIS — I10 Essential (primary) hypertension: Secondary | ICD-10-CM | POA: Insufficient documentation

## 2013-07-10 LAB — PROTIME-INR
INR: 1.03 (ref 0.00–1.49)
Prothrombin Time: 13.3 seconds (ref 11.6–15.2)

## 2013-07-10 LAB — CBC WITH DIFFERENTIAL/PLATELET
Basophils Absolute: 0 10*3/uL (ref 0.0–0.1)
Basophils Relative: 0 % (ref 0–1)
Eosinophils Absolute: 0.2 10*3/uL (ref 0.0–0.7)
HCT: 38.1 % (ref 36.0–46.0)
Hemoglobin: 13.1 g/dL (ref 12.0–15.0)
MCHC: 34.4 g/dL (ref 30.0–36.0)
Monocytes Relative: 10 % (ref 3–12)
Neutro Abs: 4.7 10*3/uL (ref 1.7–7.7)
Neutrophils Relative %: 66 % (ref 43–77)
Platelets: 237 10*3/uL (ref 150–400)
RDW: 13.1 % (ref 11.5–15.5)

## 2013-07-10 LAB — BASIC METABOLIC PANEL
BUN: 22 mg/dL (ref 6–23)
Calcium: 9.5 mg/dL (ref 8.4–10.5)
GFR calc Af Amer: 36 mL/min — ABNORMAL LOW (ref >90)
GFR calc non Af Amer: 31 mL/min — ABNORMAL LOW (ref >90)
Glucose, Bld: 97 mg/dL (ref 70–99)
Potassium: 3.5 mEq/L (ref 3.5–5.1)

## 2013-07-10 MED ORDER — SODIUM CHLORIDE 0.9 % IV SOLN
INTRAVENOUS | Status: DC
  Administered 2013-07-10: 20 mL/h via INTRAVENOUS

## 2013-07-10 MED ORDER — SODIUM CHLORIDE 0.9 % IV BOLUS (SEPSIS)
INTRAVENOUS | Status: AC | PRN
  Administered 2013-07-10: 200 mL via INTRAVENOUS

## 2013-07-10 MED ORDER — HEPARIN SOD (PORK) LOCK FLUSH 100 UNIT/ML IV SOLN
INTRAVENOUS | Status: AC | PRN
  Administered 2013-07-10: 500 [IU] via INTRAVENOUS
  Administered 2013-07-10: 1000 [IU] via INTRAVENOUS

## 2013-07-10 MED ORDER — MIDAZOLAM HCL 2 MG/2ML IJ SOLN
INTRAMUSCULAR | Status: AC | PRN
  Administered 2013-07-10: 1 mg via INTRAVENOUS

## 2013-07-10 MED ORDER — MIDAZOLAM HCL 2 MG/2ML IJ SOLN
INTRAMUSCULAR | Status: AC
  Filled 2013-07-10: qty 4

## 2013-07-10 MED ORDER — SODIUM CHLORIDE 0.9 % IV SOLN: INTRAVENOUS | Status: AC

## 2013-07-10 MED ORDER — FENTANYL CITRATE 0.05 MG/ML IJ SOLN
INTRAMUSCULAR | Status: AC
  Filled 2013-07-10: qty 4

## 2013-07-10 MED ORDER — IOHEXOL 300 MG/ML  SOLN
150.0000 mL | Freq: Once | INTRAMUSCULAR | Status: AC | PRN
  Administered 2013-07-10: 102 mL via INTRAVENOUS

## 2013-07-10 MED ORDER — FENTANYL CITRATE 0.05 MG/ML IJ SOLN
INTRAMUSCULAR | Status: AC | PRN
  Administered 2013-07-10: 25 ug via INTRAVENOUS

## 2013-07-10 MED ORDER — HYDRALAZINE HCL 20 MG/ML IJ SOLN
INTRAMUSCULAR | Status: AC
  Filled 2013-07-10: qty 1

## 2013-07-10 NOTE — Progress Notes (Signed)
Blood sent to lab

## 2013-07-10 NOTE — H&P (Signed)
Chief Complaint: "I'm here for an angiogram" Referring Physician:Cabell HPI: Belinda Anderson is an 79 y.o. female with recent onset of headaches and dizziness. 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 She is referred for diagnostic angiogram for more definitive assessment. PMHx and meds reviewed. Pt feels well, no recent illness, fevers, chills.   Past Medical History:  Past Medical History  Diagnosis Date  . Hypertension   . Thyroid disease   . Arthritis     Past Surgical History:  Past Surgical History  Procedure Laterality Date  . Eye surgery      lens implant    Family History:  Family History  Problem Relation Age of Onset  . Coronary artery disease    . Diabetes Brother     DI  . Arthritis Mother   . Heart disease Mother   . Heart disease Father 58    MI    Social History:  reports that she has never smoked. She does not have any smokeless tobacco history on file. She reports that she does not drink alcohol or use illicit drugs.  Allergies: No Known Allergies  Medications:   Medication List    ASK your doctor about these medications       aspirin EC 325 MG tablet  Take 325 mg by mouth daily.     hydrochlorothiazide 25 MG tablet  Commonly known as:  HYDRODIURIL  Take 1 tablet (25 mg total) by mouth daily.     levothyroxine 100 MCG tablet  Commonly known as:  SYNTHROID, LEVOTHROID  take 1 tablet by mouth once daily     losartan 100 MG tablet  Commonly known as:  COZAAR  Take 1 tablet (100 mg total) by mouth daily.     solifenacin 10 MG tablet  Commonly known as:  VESICARE  Take 1 tablet (10 mg total) by mouth daily.     VITAMIN B-12 PO  Take 1 tablet by mouth daily.     vitamin E 1000 UNIT capsule  Generic drug:  vitamin E  Take 2,000 Units by mouth daily.        Please HPI for pertinent positives, otherwise complete 10 system ROS negative.  Physical Exam: BP  156/76  Pulse 76  Temp(Src) 98 F (36.7 C) (Oral)  Resp 18  Ht 5\' 6"  (1.676 m)  Wt 160 lb (72.576 kg)  BMI 25.84 kg/m2  SpO2 97% Body mass index is 25.84 kg/(m^2).   General Appearance:  Alert, cooperative, no distress, appears stated age  Head:  Normocephalic, without obvious abnormality, atraumatic  ENT: Unremarkable  Neck: Supple, symmetrical, trachea midline  Lungs:   Clear to auscultation bilaterally, no w/r/r  Chest Wall:  No tenderness or deformity  Heart:  Regular rate and rhythm, S1, S2 normal, no murmur, rub or gallop.  Abdomen:   Soft, non-tender, non distended.  Extremities: Extremities normal, atraumatic, no cyanosis or edema  Pulses: 2+ and symmetric  Neurologic: Normal affect, no gross deficits.   No results found for this or any previous visit (from the past 48 hour(s)). No results found.  Assessment/Plan Cerebral aneurysms by MRA Discussed cerebral arteriogram procedure. Explained risks, complications. Use of sedation. Labs pending. Consent signed in chart  Brayton El PA-C 07/10/2013, 8:39 AM

## 2013-07-10 NOTE — Procedures (Signed)
S/P 4 vessel cerebral arteriogram  RT CFA approach . Findings. 1.Approx 5.7 mm x 3.8 mm RT PICA aneurysm

## 2013-07-10 NOTE — Progress Notes (Signed)
Received pt from [employer], RN. Report received. Assessment done. No changes noted.

## 2013-07-11 ENCOUNTER — Telehealth (HOSPITAL_COMMUNITY): Payer: Self-pay | Admitting: *Deleted

## 2013-07-11 NOTE — Telephone Encounter (Signed)
Post procedure follow up call.  Pt says she is doing great with no problems.  Says care received was good

## 2013-08-05 ENCOUNTER — Other Ambulatory Visit: Payer: Self-pay | Admitting: Family Medicine

## 2013-08-22 ENCOUNTER — Other Ambulatory Visit: Payer: Self-pay

## 2014-02-08 ENCOUNTER — Other Ambulatory Visit: Payer: Self-pay | Admitting: Family Medicine

## 2014-02-10 NOTE — Telephone Encounter (Signed)
Rx sent to the pharmacy by e-script.//AB/CMA 

## 2014-02-15 ENCOUNTER — Other Ambulatory Visit: Payer: Self-pay | Admitting: Family Medicine

## 2014-02-17 ENCOUNTER — Ambulatory Visit (INDEPENDENT_AMBULATORY_CARE_PROVIDER_SITE_OTHER): Payer: Medicare Other | Admitting: Family Medicine

## 2014-02-17 ENCOUNTER — Encounter: Payer: Self-pay | Admitting: Family Medicine

## 2014-02-17 VITALS — BP 130/70 | HR 81 | Temp 98.4°F | Wt 158.0 lb

## 2014-02-17 DIAGNOSIS — E2839 Other primary ovarian failure: Secondary | ICD-10-CM

## 2014-02-17 DIAGNOSIS — E039 Hypothyroidism, unspecified: Secondary | ICD-10-CM

## 2014-02-17 DIAGNOSIS — Z1239 Encounter for other screening for malignant neoplasm of breast: Secondary | ICD-10-CM

## 2014-02-17 DIAGNOSIS — I10 Essential (primary) hypertension: Secondary | ICD-10-CM

## 2014-02-17 MED ORDER — HYDROCHLOROTHIAZIDE 25 MG PO TABS: ORAL_TABLET | ORAL | Status: DC

## 2014-02-17 MED ORDER — LOSARTAN POTASSIUM 100 MG PO TABS: ORAL_TABLET | ORAL | Status: DC

## 2014-02-17 NOTE — Patient Instructions (Signed)

## 2014-02-17 NOTE — Assessment & Plan Note (Signed)
Check lab con't meds 

## 2014-02-17 NOTE — Progress Notes (Signed)
  Subjective:    Patient here for follow-up of elevated blood pressure.  She is exercising and is adherent to a low-salt diet.  Blood pressure is well controlled at home. Cardiac symptoms: none. Patient denies: chest pain, chest pressure/discomfort, claudication, dyspnea, exertional chest pressure/discomfort, fatigue, irregular heart beat, lower extremity edema, near-syncope, orthopnea, palpitations, paroxysmal nocturnal dyspnea, syncope and tachypnea. Cardiovascular risk factors: advanced age (older than 1355 for men, 9865 for women) and hypertension. Use of agents associated with hypertension: none. History of target organ damage: none.  The following portions of the patient's history were reviewed and updated as appropriate: allergies, current medications, past family history, past medical history, past social history, past surgical history and problem list.  Review of Systems Pertinent items are noted in HPI.     Objective:    BP 130/70  Pulse 81  Temp(Src) 98.4 F (36.9 C) (Oral)  Wt 158 lb (71.668 kg)  SpO2 97% General appearance: alert, cooperative, appears stated age and no distress Head: Normocephalic, without obvious abnormality, atraumatic Eyes: conjunctivae/corneas clear. PERRL, EOM's intact. Fundi benign. Ears: normal TM's and external ear canals both ears Nose: Nares normal. Septum midline. Mucosa normal. No drainage or sinus tenderness. Throat: lips, mucosa, and tongue normal; teeth and gums normal Neck: no adenopathy, no carotid bruit, no JVD, supple, symmetrical, trachea midline and thyroid not enlarged, symmetric, no tenderness/mass/nodules Lungs: clear to auscultation bilaterally Heart: S1, S2 normal Extremities: extremities normal, atraumatic, no cyanosis or edema    Assessment:    Hypertension, normal blood pressure . Evidence of target organ damage: none.    Plan:    Medication: no change. Dietary sodium restriction. Regular aerobic exercise. Check blood  pressures 2-3 times weekly and record. Follow up: 6 months and as needed.

## 2014-02-17 NOTE — Progress Notes (Signed)
Pre visit review using our clinic review tool, if applicable. No additional management support is needed unless otherwise documented below in the visit note. 

## 2014-02-18 ENCOUNTER — Telehealth: Payer: Self-pay | Admitting: Family Medicine

## 2014-02-18 NOTE — Telephone Encounter (Signed)
Relevant patient education assigned to patient using Emmi. ° °

## 2014-03-05 ENCOUNTER — Other Ambulatory Visit: Payer: Medicare Other

## 2014-03-05 ENCOUNTER — Ambulatory Visit: Payer: Medicare Other

## 2014-04-28 ENCOUNTER — Other Ambulatory Visit: Payer: Self-pay | Admitting: Family Medicine

## 2014-06-06 ENCOUNTER — Encounter: Payer: Self-pay | Admitting: Gastroenterology

## 2014-07-14 ENCOUNTER — Ambulatory Visit: Payer: Medicare Other | Admitting: Physician Assistant

## 2014-07-16 ENCOUNTER — Ambulatory Visit (HOSPITAL_BASED_OUTPATIENT_CLINIC_OR_DEPARTMENT_OTHER)
Admission: RE | Admit: 2014-07-16 | Discharge: 2014-07-16 | Disposition: A | Discharge status: Home / Self Care | Payer: Medicare Other | Source: Ambulatory Visit | Attending: Medical | Admitting: Medical

## 2014-07-16 ENCOUNTER — Ambulatory Visit (INDEPENDENT_AMBULATORY_CARE_PROVIDER_SITE_OTHER): Payer: Medicare Other | Admitting: Medical

## 2014-07-16 ENCOUNTER — Other Ambulatory Visit (HOSPITAL_BASED_OUTPATIENT_CLINIC_OR_DEPARTMENT_OTHER): Payer: Medicare Other

## 2014-07-16 ENCOUNTER — Telehealth: Payer: Self-pay | Admitting: Medical

## 2014-07-16 ENCOUNTER — Encounter: Payer: Self-pay | Admitting: Medical

## 2014-07-16 VITALS — BP 159/65 | HR 80 | Temp 97.8°F | Ht 65.0 in | Wt 151.6 lb

## 2014-07-16 DIAGNOSIS — M5430 Sciatica, unspecified side: Secondary | ICD-10-CM

## 2014-07-16 DIAGNOSIS — M25561 Pain in right knee: Secondary | ICD-10-CM

## 2014-07-16 DIAGNOSIS — M5431 Sciatica, right side: Secondary | ICD-10-CM

## 2014-07-16 DIAGNOSIS — L089 Local infection of the skin and subcutaneous tissue, unspecified: Secondary | ICD-10-CM

## 2014-07-16 DIAGNOSIS — IMO0002 Reserved for concepts with insufficient information to code with codable children: Secondary | ICD-10-CM | POA: Insufficient documentation

## 2014-07-16 DIAGNOSIS — M1710 Unilateral primary osteoarthritis, unspecified knee: Secondary | ICD-10-CM | POA: Insufficient documentation

## 2014-07-16 DIAGNOSIS — M25569 Pain in unspecified knee: Secondary | ICD-10-CM | POA: Diagnosis present

## 2014-07-16 DIAGNOSIS — M25469 Effusion, unspecified knee: Secondary | ICD-10-CM | POA: Insufficient documentation

## 2014-07-16 DIAGNOSIS — M79604 Pain in right leg: Secondary | ICD-10-CM

## 2014-07-16 DIAGNOSIS — M79609 Pain in unspecified limb: Secondary | ICD-10-CM

## 2014-07-16 MED ORDER — CEPHALEXIN 500 MG PO CAPS: 500.0000 mg | ORAL_CAPSULE | Freq: Two times a day (BID) | ORAL | Status: DC

## 2014-07-16 MED ORDER — DICLOFENAC SODIUM 75 MG PO TBEC: 75.0000 mg | DELAYED_RELEASE_TABLET | Freq: Two times a day (BID) | ORAL | Status: DC

## 2014-07-16 NOTE — Patient Instructions (Signed)
For your rt knee pain and rt lower extremity swelling I want to get a rt knee xray and rt lower extremity doppler stat. Please get studies done now and I will discuss results of doppler with you by phone.  If doppler is negative then will prescribe diclofenac for pain, elevate your leg and will prescribe antibiotic for possible cellulitis of leg.  Follow up in 7 days or as needed.  Not also your back discomfort, hip discomfort and sciatic area pain may be from gait change since your rt leg started hurting.

## 2014-07-16 NOTE — Assessment & Plan Note (Signed)
Patient has pretibial region pain with swelling and some warmth that occurred during short car trip. She will get a stat right lower extremity Doppler today and I will get the results back today. Asked patient to wait downstairs once that test is done so I can get the report. If the report is negative then I will treat her for mild early cellulitis and give antibiotic.

## 2014-07-16 NOTE — Progress Notes (Signed)
   Subjective:    Patient ID: Belinda Anderson, female    DOB: [birth year], 80 y.o.   MRN: [id number]  HPI  Pt in with some back region pain and some rt hip area pain.  But actually points to her right sciatic area. Pain started on Friday. She first felt pain in the above area after she got off toilet and felt acute and severe pain in her rt knee. Pt states since Friday has knee pain and hurts to walk. So she thinks change in gait affected her rt hip and back.  Pt does not report any sharp radicular pain from her back to rt foot.  Most painful area is the rt pretibial area and she has swelling in that area. Calf overall is a lot larger than rt side. She reports no fevers or chills or shortness of breath.  Pt was driving recently 2 hour trip.    Review of Systems  Constitutional: Negative for fever, chills and fatigue.  HENT: Negative.   Respiratory: Negative for cough, choking, chest tightness, shortness of breath and wheezing.   Cardiovascular: Negative for chest pain and palpitations.  Musculoskeletal:       Right sciatic region mild discomfort. On discussion is really not in her mid lumbar spine or her right hip. Right knee pain moderate. Right pretibial region pain moderate to severe with some swelling and redness. The pretibial areas the area that hurts the most.  Neurological: Negative.   Hematological: Negative for adenopathy. Does not bruise/bleed easily.  Psychiatric/Behavioral: Negative.        Objective:   Physical Exam  Constitutional: She is oriented to person, place, and time. She appears well-developed and well-nourished. No distress.  HENT:  Head: Normocephalic and atraumatic.  Eyes: Conjunctivae and EOM are normal. Pupils are equal, round, and reactive to light. Right eye exhibits no discharge. Left eye exhibits no discharge. No scleral icterus.  Neck: Normal range of motion. Neck supple. No JVD present. No tracheal deviation present.  Cardiovascular: Normal rate,  regular rhythm and normal heart sounds.  Exam reveals no gallop and no friction rub.   No murmur heard. Pulmonary/Chest: Effort normal. No stridor. No respiratory distress. She has no wheezes. She has no rales. She exhibits no tenderness.  Musculoskeletal:  Mid lumbar region-no pain on palpation of the lumbar region or pain on straight leg lifts.  Right hip-no pain on range of motion of the hip. Right sciatic region-faint minimal pain in this area on palpation.  Right knee-good flexion extension with minimal faint crepitus. No pain on range of motion. No instability and no swelling. Right lower extremity-negative Homans sign. However the right calf is markedly swollen compared to the left side. Some minimal tortuous veins seen. The pretibial region is mildly tender to palpation and has moderate warmth. No obvious redness.  Lymphadenopathy:    She has no cervical adenopathy.  Neurological: She is alert and oriented to person, place, and time.  Skin: She is not diaphoretic.  Mild warmth to the right pretibial region. Mild tortuous varicose veins seen.  Psychiatric: She has a normal mood and affect. Her behavior is normal. Judgment and thought content normal.           Assessment & Plan:

## 2014-07-16 NOTE — Assessment & Plan Note (Addendum)
This is not definite but if the right lower extremity Doppler is negative then will prescribe her antibiotic for early cellulitis. Note on exam the right leg is mildly swollen in the pretibial area with some warmth and tenderness to palpation. So again if the Doppler is negative would likely prescribe cephalexin. Note the Doppler on patient's leg was negative. Patient was notified that I am calling in diclofenac and this cephalexin. She'll follow up in 1 week or sooner if any worsening or changing symptoms

## 2014-07-16 NOTE — Assessment & Plan Note (Signed)
Will get x-ray of her knee and assess the joint space. Notify patient of result when I get the report. Diclofenac for that as well.

## 2014-07-16 NOTE — Telephone Encounter (Signed)
Notified pt doppler lower ext neg for dvt. Mild degenerative changes of knees.

## 2014-07-16 NOTE — Assessment & Plan Note (Signed)
This is not her primary problem but rather I think is secondary to her changing day after she experienced the right knee pain. I am prescribing diclofenac for her knee pain and I think this will help her with sciatica type pain as well.

## 2014-08-08 ENCOUNTER — Other Ambulatory Visit: Payer: Self-pay | Admitting: Family Medicine

## 2014-08-29 ENCOUNTER — Telehealth: Payer: Self-pay | Admitting: Family Medicine

## 2014-09-01 NOTE — Telephone Encounter (Signed)
patient will need labs before I fill her med's please schedule a lab only apt.     KP

## 2014-09-03 NOTE — Telephone Encounter (Signed)
Pt has appt for labs tommorw

## 2014-09-04 ENCOUNTER — Other Ambulatory Visit (INDEPENDENT_AMBULATORY_CARE_PROVIDER_SITE_OTHER): Payer: Medicare Other

## 2014-09-04 DIAGNOSIS — E039 Hypothyroidism, unspecified: Secondary | ICD-10-CM

## 2014-09-04 DIAGNOSIS — I10 Essential (primary) hypertension: Secondary | ICD-10-CM

## 2014-09-04 LAB — CBC WITH DIFFERENTIAL/PLATELET
BASOS PCT: 0.5 % (ref 0.0–3.0)
Basophils Absolute: 0 10*3/uL (ref 0.0–0.1)
EOS PCT: 2.7 % (ref 0.0–5.0)
Eosinophils Absolute: 0.1 10*3/uL (ref 0.0–0.7)
HCT: 37.8 % (ref 36.0–46.0)
Hemoglobin: 12.6 g/dL (ref 12.0–15.0)
Lymphocytes Relative: 27.8 % (ref 12.0–46.0)
Lymphs Abs: 1.5 10*3/uL (ref 0.7–4.0)
MCHC: 33.3 g/dL (ref 30.0–36.0)
MCV: 93.9 fl (ref 78.0–100.0)
MONO ABS: 0.6 10*3/uL (ref 0.1–1.0)
Monocytes Relative: 10.4 % (ref 3.0–12.0)
Neutro Abs: 3.2 10*3/uL (ref 1.4–7.7)
Neutrophils Relative %: 58.6 % (ref 43.0–77.0)
Platelets: 258 10*3/uL (ref 150.0–400.0)
RBC: 4.03 Mil/uL (ref 3.87–5.11)
RDW: 13.8 % (ref 11.5–15.5)
WBC: 5.5 10*3/uL (ref 4.0–10.5)

## 2014-09-04 LAB — LIPID PANEL
CHOLESTEROL: 210 mg/dL — AB (ref 0–200)
HDL: 44.1 mg/dL (ref >39.00)
LDL Cholesterol: 141 mg/dL — ABNORMAL HIGH (ref 0–99)
NonHDL: 165.9
Total CHOL/HDL Ratio: 5
Triglycerides: 123 mg/dL (ref 0.0–149.0)
VLDL: 24.6 mg/dL (ref 0.0–40.0)

## 2014-09-04 LAB — HEPATIC FUNCTION PANEL
ALT: 12 U/L (ref 0–35)
AST: 21 U/L (ref 0–37)
Albumin: 4.1 g/dL (ref 3.5–5.2)
Alkaline Phosphatase: 108 U/L (ref 39–117)
BILIRUBIN DIRECT: 0.1 mg/dL (ref 0.0–0.3)
Total Bilirubin: 0.8 mg/dL (ref 0.2–1.2)
Total Protein: 7 g/dL (ref 6.0–8.3)

## 2014-09-04 LAB — BASIC METABOLIC PANEL
BUN: 30 mg/dL — ABNORMAL HIGH (ref 6–23)
CHLORIDE: 103 meq/L (ref 96–112)
CO2: 27 mEq/L (ref 19–32)
CREATININE: 1.9 mg/dL — AB (ref 0.4–1.2)
Calcium: 9.5 mg/dL (ref 8.4–10.5)
GFR: 26.85 mL/min — ABNORMAL LOW (ref >60.00)
Glucose, Bld: 94 mg/dL (ref 70–99)
Potassium: 4.2 mEq/L (ref 3.5–5.1)
SODIUM: 140 meq/L (ref 135–145)

## 2014-09-04 LAB — TSH: TSH: 0.25 u[IU]/mL — ABNORMAL LOW (ref 0.35–4.50)

## 2014-09-04 NOTE — Telephone Encounter (Signed)
Pt came in for lab visit.  She is worried about getting the refill.  The pharmacy gave her 2 pills to carry her over, but she has no pill for today, she is completely out.  Please advise pt when prescription has been sent to pharmacy.   [facility] on [address]  [phone] is best number to reach patient

## 2014-09-04 NOTE — Telephone Encounter (Signed)
Rx faxed, patient aware     KP 

## 2014-09-16 ENCOUNTER — Other Ambulatory Visit: Payer: Self-pay

## 2014-09-16 DIAGNOSIS — N289 Disorder of kidney and ureter, unspecified: Secondary | ICD-10-CM

## 2014-09-18 MED ORDER — PRAVASTATIN SODIUM 20 MG PO TABS: 20.0000 mg | ORAL_TABLET | Freq: Every day | ORAL | Status: DC

## 2014-09-30 ENCOUNTER — Other Ambulatory Visit: Payer: Self-pay | Admitting: Family Medicine

## 2014-09-30 MED ORDER — LEVOTHYROXINE SODIUM 88 MCG PO TABS: 88.0000 ug | ORAL_TABLET | Freq: Every day | ORAL | Status: DC

## 2014-09-30 NOTE — Telephone Encounter (Signed)
Lelon PerlaYvonne R Lowne, DO  Arnette NorrisKimberly P Mccoy Testa, CMA           Change synthroid to 88 mcg #30 1 po qd , 2 refills ---recheck TSH iin 2 months

## 2014-09-30 NOTE — Telephone Encounter (Signed)
Please review labs and advise if the refill is appropriate.  TSH was abnormal    KP

## 2014-09-30 NOTE — Addendum Note (Signed)
Addended by: Arnette NorrisPAYNE, Jalyne Brodzinski P on: 09/30/2014 05:18 PM   Modules accepted: Orders

## 2014-10-12 ENCOUNTER — Other Ambulatory Visit: Payer: Self-pay | Admitting: Family Medicine

## 2014-11-20 ENCOUNTER — Other Ambulatory Visit: Payer: Self-pay | Admitting: Family Medicine

## 2014-11-26 ENCOUNTER — Other Ambulatory Visit: Payer: Self-pay | Admitting: Family Medicine

## 2014-11-26 DIAGNOSIS — E0590 Thyrotoxicosis, unspecified without thyrotoxic crisis or storm: Secondary | ICD-10-CM

## 2014-12-16 ENCOUNTER — Ambulatory Visit (HOSPITAL_BASED_OUTPATIENT_CLINIC_OR_DEPARTMENT_OTHER)
Admission: RE | Admit: 2014-12-16 | Discharge: 2014-12-16 | Disposition: A | Discharge status: Home / Self Care | Payer: Medicare Other | Source: Ambulatory Visit | Attending: Family Medicine | Admitting: Family Medicine

## 2014-12-16 ENCOUNTER — Ambulatory Visit (INDEPENDENT_AMBULATORY_CARE_PROVIDER_SITE_OTHER): Payer: Medicare Other | Admitting: Family Medicine

## 2014-12-16 ENCOUNTER — Telehealth: Payer: Self-pay | Admitting: Family Medicine

## 2014-12-16 ENCOUNTER — Encounter: Payer: Self-pay | Admitting: Family Medicine

## 2014-12-16 VITALS — BP 120/74 | HR 74 | Temp 98.4°F | Wt 150.4 lb

## 2014-12-16 DIAGNOSIS — K5900 Constipation, unspecified: Secondary | ICD-10-CM | POA: Diagnosis not present

## 2014-12-16 DIAGNOSIS — M5410 Radiculopathy, site unspecified: Secondary | ICD-10-CM | POA: Insufficient documentation

## 2014-12-16 DIAGNOSIS — M545 Low back pain: Secondary | ICD-10-CM | POA: Diagnosis present

## 2014-12-16 DIAGNOSIS — M5441 Lumbago with sciatica, right side: Secondary | ICD-10-CM

## 2014-12-16 DIAGNOSIS — M479 Spondylosis, unspecified: Secondary | ICD-10-CM | POA: Insufficient documentation

## 2014-12-16 MED ORDER — SOLIFENACIN SUCCINATE 10 MG PO TABS: 10.0000 mg | ORAL_TABLET | Freq: Every day | ORAL | Status: DC

## 2014-12-16 MED ORDER — NONFORMULARY OR COMPOUNDED ITEM: Status: DC

## 2014-12-16 NOTE — Telephone Encounter (Signed)
Caller name: Keyandra Relation to pt: self Call back number: [phone] Pharmacy: [facility] on [address]  Reason for call:   Requesting vesicare refill

## 2014-12-16 NOTE — Progress Notes (Signed)
Pre visit review using our clinic review tool, if applicable. No additional management support is needed unless otherwise documented below in the visit note. 

## 2014-12-16 NOTE — Patient Instructions (Signed)
Sciatica Sciatica is pain, weakness, numbness, or tingling along the path of the sciatic nerve. The nerve starts in the lower back and runs down the back of each leg. The nerve controls the muscles in the lower leg and in the back of the knee, while also providing sensation to the back of the thigh, lower leg, and the sole of your foot. Sciatica is a symptom of another medical condition. For instance, nerve damage or certain conditions, such as a herniated disk or bone spur on the spine, pinch or put pressure on the sciatic nerve. This causes the pain, weakness, or other sensations normally associated with sciatica. Generally, sciatica only affects one side of the body. CAUSES   Herniated or slipped disc.  Degenerative disk disease.  A pain disorder involving the narrow muscle in the buttocks (piriformis syndrome).  Pelvic injury or fracture.  Pregnancy.  Tumor (rare). SYMPTOMS  Symptoms can vary from mild to very severe. The symptoms usually travel from the low back to the buttocks and down the back of the leg. Symptoms can include:  Mild tingling or dull aches in the lower back, leg, or hip.  Numbness in the back of the calf or sole of the foot.  Burning sensations in the lower back, leg, or hip.  Sharp pains in the lower back, leg, or hip.  Leg weakness.  Severe back pain inhibiting movement. These symptoms may get worse with coughing, sneezing, laughing, or prolonged sitting or standing. Also, being overweight may worsen symptoms. DIAGNOSIS  Your caregiver will perform a physical exam to look for common symptoms of sciatica. He or she may ask you to do certain movements or activities that would trigger sciatic nerve pain. Other tests may be performed to find the cause of the sciatica. These may include:  Blood tests.  X-rays.  Imaging tests, such as an MRI or CT scan. TREATMENT  Treatment is directed at the cause of the sciatic pain. Sometimes, treatment is not necessary  and the pain and discomfort goes away on its own. If treatment is needed, your caregiver may suggest:  Over-the-counter medicines to relieve pain.  Prescription medicines, such as anti-inflammatory medicine, muscle relaxants, or narcotics.  Applying heat or ice to the painful area.  Steroid injections to lessen pain, irritation, and inflammation around the nerve.  Reducing activity during periods of pain.  Exercising and stretching to strengthen your abdomen and improve flexibility of your spine. Your caregiver may suggest losing weight if the extra weight makes the back pain worse.  Physical therapy.  Surgery to eliminate what is pressing or pinching the nerve, such as a bone spur or part of a herniated disk. HOME CARE INSTRUCTIONS   Only take over-the-counter or prescription medicines for pain or discomfort as directed by your caregiver.  Apply ice to the affected area for 20 minutes, 3-4 times a day for the first 48-72 hours. Then try heat in the same way.  Exercise, stretch, or perform your usual activities if these do not aggravate your pain.  Attend physical therapy sessions as directed by your caregiver.  Keep all follow-up appointments as directed by your caregiver.  Do not wear high heels or shoes that do not provide proper support.  Check your mattress to see if it is too soft. A firm mattress may lessen your pain and discomfort. SEEK IMMEDIATE MEDICAL CARE IF:   You lose control of your bowel or bladder (incontinence).  You have increasing weakness in the lower back, pelvis, buttocks,   or legs.  You have redness or swelling of your back.  You have a burning sensation when you urinate.  You have pain that gets worse when you lie down or awakens you at night.  Your pain is worse than you have experienced in the past.  Your pain is lasting longer than 4 weeks.  You are suddenly losing weight without reason. MAKE SURE YOU:  Understand these  instructions.  Will watch your condition.  Will get help right away if you are not doing well or get worse. Document Released: 09/27/2001 Document Revised: 04/03/2012 Document Reviewed: 02/12/2012 [employer] Patient Information 2015 [employer]. This information is not intended to replace advice given to you by your health care provider. Make sure you discuss any questions you have with your health care provider.  

## 2014-12-16 NOTE — Progress Notes (Signed)
Subjective:    Patient ID: Belinda Anderson, female    DOB: July 07, 1932, 81 y.o.   MRN: [id number]  HPI   Past Medical History  Diagnosis Date  . Hypertension   . Thyroid disease   . Arthritis     Review of Systems     Objective:    Physical Exam  BP 120/74 mmHg  Pulse 74  Temp(Src) 98.4 F (36.9 C) (Oral)  Wt 150 lb 6.4 oz (68.221 kg)  SpO2 97% Wt Readings from Last 3 Encounters:  12/16/14 150 lb 6.4 oz (68.221 kg)  07/16/14 151 lb 9.6 oz (68.765 kg)  02/17/14 158 lb (71.668 kg)     Lab Results  Component Value Date   WBC 5.5 09/04/2014   HGB 12.6 09/04/2014   HCT 37.8 09/04/2014   PLT 258.0 09/04/2014   GLUCOSE 94 09/04/2014   CHOL 210* 09/04/2014   TRIG 123.0 09/04/2014   HDL 44.10 09/04/2014   LDLCALC 141* 09/04/2014   ALT 12 09/04/2014   AST 21 09/04/2014   NA 140 09/04/2014   K 4.2 09/04/2014   CL 103 09/04/2014   CREATININE 1.9* 09/04/2014   BUN 30* 09/04/2014   CO2 27 09/04/2014   TSH 0.25* 09/04/2014   INR 1.03 07/10/2013    US Venous Img Lower Unilateral Right  07/16/2014   CLINICAL DATA:  Felt a pop in her RIGHT knee when rising to a standing position 5 days ago, pain and swelling in RIGHT knee in lower leg since  EXAM: RIGHT LOWER EXTREMITY VENOUS DOPPLER ULTRASOUND  TECHNIQUE: Gray-scale sonography with graded compression, as well as color Doppler and duplex ultrasound were performed to evaluate the lower extremity deep venous systems from the level of the common femoral vein and including the common femoral, femoral, profunda femoral, popliteal and calf veins including the posterior tibial, peroneal and gastrocnemius veins when visible. The superficial great saphenous vein was also interrogated. Spectral Doppler was utilized to evaluate flow at rest and with distal augmentation maneuvers in the common femoral, femoral and popliteal veins.  COMPARISON:  None  FINDINGS: Common Femoral Vein: No evidence of thrombus. Normal compressibility and flow  on color Doppler imaging.  Saphenofemoral Junction: No evidence of thrombus. Normal compressibility and flow on color Doppler imaging.  Profunda Femoral Vein: No evidence of thrombus. Normal compressibility and flow on color Doppler imaging.  Femoral Vein: No evidence of thrombus. Normal compressibility, respiratory phasicity and response to augmentation.  Popliteal Vein: No evidence of thrombus. Normal compressibility, respiratory phasicity and response to augmentation.  Calf Veins: No evidence of thrombus. Normal compressibility and flow on color Doppler imaging.  Superficial Great Saphenous Vein: No evidence of thrombus. Normal compressibility and flow on color Doppler imaging.  Venous Reflux:  None.  Other Findings:  None.  IMPRESSION: No evidence of deep venous thrombosis in the RIGHT lower extremity.   Electronically Signed   By: Ulyses Southward M.D.   On: 07/16/2014 15:11   Dg Knee 2 Views Right  07/16/2014   CLINICAL DATA:  Pain  EXAM: RIGHT KNEE - 2 VIEW  COMPARISON:  None.  FINDINGS: Frontal and lateral views were obtained. There is no fracture, dislocation, or effusion. There is mild narrowing medially. There is no erosive change.  IMPRESSION: Mild osteoarthritic change.  No fracture or effusion.   Electronically Signed   By: Bretta Bang M.D.   On: 07/16/2014 14:52       Assessment & Plan:   Problem List Items Addressed This  Visit    None    Visit Diagnoses    Low back pain with right-sided sciatica, unspecified back pain laterality    -  Primary    Relevant Orders    DG Lumbar Spine Complete        Belinda FreudYvonne Lowne, DO  Subjective:    Belinda Messlizabeth Guida is a 81 y.o. female who presents for evaluation of low back pain. The patient has had no prior back problems. Symptoms have been present for 1 week and are gradually worsening.  Onset was related to / precipitated by yard work. The pain is located in the right lumbar area and radiates to the right foot. The pain is described as aching  and burning and occurs all day. She rates her pain as moderate. Symptoms are exacerbated by standing. Symptoms are improved by acetaminophen. She has also tried change in body position and rest which provided no symptom relief. She has burning pain in the right leg associated with the back pain. The patient has no "red flag" history indicative of complicated back pain.  The following portions of the patient's history were reviewed and updated as appropriate:  She  has a past medical history of Hypertension; Thyroid disease; and Arthritis. She  does not have any pertinent problems on file. She  has past surgical history that includes Eye surgery. Her family history includes Arthritis in her mother; Coronary artery disease in an other family member; Diabetes in her brother; Heart disease in her mother; Heart disease (age of onset: 6540) in her father. She  reports that she has never smoked. She does not have any smokeless tobacco history on file. She reports that she does not drink alcohol or use illicit drugs. She has a current medication list which includes the following prescription(s): aspirin ec, cyanocobalamin, diclofenac, hydrochlorothiazide, levothyroxine, losartan, pravastatin, solifenacin, vitamin e, and NONFORMULARY OR COMPOUNDED ITEM. Current Outpatient Prescriptions on File Prior to Visit  Medication Sig Dispense Refill  . aspirin EC 325 MG EC tablet Take 325 mg by mouth daily.      . Cyanocobalamin (VITAMIN B-12 PO) Take 1 tablet by mouth daily.     . diclofenac (VOLTAREN) 75 MG EC tablet Take 1 tablet (75 mg total) by mouth 2 (two) times daily. 20 tablet 0  . hydrochlorothiazide (HYDRODIURIL) 25 MG tablet Take 1 tablet (25 mg total) by mouth daily. Office visit due now for more refills 90 tablet 0  . levothyroxine (SYNTHROID, LEVOTHROID) 88 MCG tablet Take 1 tablet (88 mcg total) by mouth daily before breakfast. Repeat labs are due now 30 tablet 0  . losartan (COZAAR) 100 MG tablet Take 1  tablet (100 mg total) by mouth daily. Office visit due now 90 tablet 0  . pravastatin (PRAVACHOL) 20 MG tablet Take 1 tablet (20 mg total) by mouth daily. 30 tablet 2  . solifenacin (VESICARE) 10 MG tablet Take 1 tablet (10 mg total) by mouth daily. 30 tablet 5  . vitamin E (VITAMIN E) 1000 UNIT capsule Take 2,000 Units by mouth daily.     No current facility-administered medications on file prior to visit.   She has No Known Allergies..  Review of Systems Pertinent items are noted in HPI.    Objective:   Full range of motion without pain, no tenderness, no spasm, no curvature. some weakness in R toe-- plantar flexion --otherwise no weakness noted    Assessment:    Nonspecific acute low back pain    Plan:  Agricultural engineer distributed. Proper lifting, bending technique discussed. Stretching exercises discussed. Short (2-4 day) period of relative rest recommended until acute symptoms improve. Ice to affected area as needed for local pain relief. Heat to affected area as needed for local pain relief. OTC analgesics as needed. Follow-up in 2 weeks.

## 2015-01-01 ENCOUNTER — Other Ambulatory Visit: Payer: Self-pay

## 2015-01-01 MED ORDER — PRAVASTATIN SODIUM 20 MG PO TABS: 20.0000 mg | ORAL_TABLET | Freq: Every day | ORAL | Status: DC

## 2015-01-02 ENCOUNTER — Other Ambulatory Visit: Payer: Self-pay

## 2015-01-02 ENCOUNTER — Other Ambulatory Visit: Payer: Self-pay | Admitting: Family Medicine

## 2015-01-02 DIAGNOSIS — E785 Hyperlipidemia, unspecified: Secondary | ICD-10-CM

## 2015-01-06 ENCOUNTER — Other Ambulatory Visit (INDEPENDENT_AMBULATORY_CARE_PROVIDER_SITE_OTHER): Payer: Medicare Other

## 2015-01-06 DIAGNOSIS — E785 Hyperlipidemia, unspecified: Secondary | ICD-10-CM

## 2015-01-06 DIAGNOSIS — E0590 Thyrotoxicosis, unspecified without thyrotoxic crisis or storm: Secondary | ICD-10-CM

## 2015-01-06 LAB — HEPATIC FUNCTION PANEL
ALK PHOS: 109 U/L (ref 39–117)
ALT: 8 U/L (ref 0–35)
AST: 15 U/L (ref 0–37)
Albumin: 4 g/dL (ref 3.5–5.2)
Bilirubin, Direct: 0.1 mg/dL (ref 0.0–0.3)
Total Bilirubin: 0.5 mg/dL (ref 0.2–1.2)
Total Protein: 6.9 g/dL (ref 6.0–8.3)

## 2015-01-06 LAB — LIPID PANEL
CHOL/HDL RATIO: 4
CHOLESTEROL: 167 mg/dL (ref 0–200)
HDL: 39.3 mg/dL (ref >39.00)
LDL CALC: 101 mg/dL — AB (ref 0–99)
NonHDL: 127.7
TRIGLYCERIDES: 134 mg/dL (ref 0.0–149.0)
VLDL: 26.8 mg/dL (ref 0.0–40.0)

## 2015-01-06 LAB — BASIC METABOLIC PANEL
BUN: 26 mg/dL — ABNORMAL HIGH (ref 6–23)
CO2: 30 mEq/L (ref 19–32)
Calcium: 9.4 mg/dL (ref 8.4–10.5)
Chloride: 102 mEq/L (ref 96–112)
Creatinine, Ser: 1.9 mg/dL — ABNORMAL HIGH (ref 0.40–1.20)
GFR: 26.99 mL/min — AB (ref >60.00)
Glucose, Bld: 98 mg/dL (ref 70–99)
POTASSIUM: 3.7 meq/L (ref 3.5–5.1)
SODIUM: 141 meq/L (ref 135–145)

## 2015-01-06 LAB — TSH: TSH: 1.89 u[IU]/mL (ref 0.35–4.50)

## 2015-01-16 ENCOUNTER — Telehealth: Payer: Self-pay

## 2015-01-16 NOTE — Telephone Encounter (Signed)
-----   Message from Oneal GroutJennifer S Sebastian sent at 01/16/2015  3:08 PM EDT ----- [facility] has contacted the patient 4 times with not return call. Thanks ----- Message -----    From: Arnette NorrisKimberly P Patrecia Veiga, CMA    Sent: 01/16/2015  11:35 AM      To: Oneal GroutJennifer S Sebastian  Would you check the status of the Nephrology referral.   Thanks KP

## 2015-01-16 NOTE — Telephone Encounter (Signed)
Spoke with patient and she stated she is almost 81 years old and she sometimes she leaks but she does not feel like she needs to see someone, I reviewed her labs and she said if it's what you want her to do she will. I gave her the number to schedule an apt with [facility].      KP

## 2015-02-02 ENCOUNTER — Encounter: Payer: Self-pay | Admitting: Family Medicine

## 2015-02-02 ENCOUNTER — Ambulatory Visit (INDEPENDENT_AMBULATORY_CARE_PROVIDER_SITE_OTHER): Payer: Medicare Other | Admitting: Family Medicine

## 2015-02-02 ENCOUNTER — Telehealth: Payer: Self-pay | Admitting: Family Medicine

## 2015-02-02 VITALS — BP 124/84 | HR 77 | Temp 98.0°F | Resp 16

## 2015-02-02 DIAGNOSIS — E039 Hypothyroidism, unspecified: Secondary | ICD-10-CM | POA: Diagnosis not present

## 2015-02-02 DIAGNOSIS — R32 Unspecified urinary incontinence: Secondary | ICD-10-CM

## 2015-02-02 DIAGNOSIS — E785 Hyperlipidemia, unspecified: Secondary | ICD-10-CM

## 2015-02-02 DIAGNOSIS — I10 Essential (primary) hypertension: Secondary | ICD-10-CM

## 2015-02-02 MED ORDER — PRAVASTATIN SODIUM 20 MG PO TABS: 20.0000 mg | ORAL_TABLET | Freq: Every day | ORAL | Status: DC

## 2015-02-02 MED ORDER — HYDROCHLOROTHIAZIDE 25 MG PO TABS: 25.0000 mg | ORAL_TABLET | Freq: Every day | ORAL | Status: DC

## 2015-02-02 MED ORDER — SOLIFENACIN SUCCINATE 10 MG PO TABS: 10.0000 mg | ORAL_TABLET | Freq: Every day | ORAL | Status: DC

## 2015-02-02 MED ORDER — LEVOTHYROXINE SODIUM 88 MCG PO TABS: ORAL_TABLET | ORAL | Status: DC

## 2015-02-02 NOTE — Telephone Encounter (Signed)
Rx requested from [facility] [address]  Lab current and Rx faxed. Patient has an apt today.    KP

## 2015-02-02 NOTE — Assessment & Plan Note (Signed)
Stable  con't hctz  

## 2015-02-02 NOTE — Assessment & Plan Note (Signed)
con't synthroid Lab Results  Component Value Date   TSH 1.89 01/06/2015

## 2015-02-02 NOTE — Patient Instructions (Signed)

## 2015-02-02 NOTE — Assessment & Plan Note (Signed)
Hold pravastatin for now If memory does improve --- we will change med

## 2015-02-02 NOTE — Progress Notes (Signed)
Pre visit review using our clinic review tool, if applicable. No additional management support is needed unless otherwise documented below in the visit note. 

## 2015-02-02 NOTE — Progress Notes (Signed)
Patient ID: Belinda Anderson, female    DOB: 28-Oct-1931  Age: 81 y.o. MRN: [id number]    Subjective:  Subjective HPI Belinda Messlizabeth Bianca presents for f/u.   Pt states she wants to come off the pravachol because of her memory ---   Review of Systems  Constitutional: Negative for activity change, appetite change, fatigue and unexpected weight change.  Respiratory: Negative for cough and shortness of breath.   Cardiovascular: Negative for chest pain and palpitations.  Psychiatric/Behavioral: Negative for behavioral problems and dysphoric mood. The patient is not nervous/anxious.     History Past Medical History  Diagnosis Date  . Hypertension   . Thyroid disease   . Arthritis     She has past surgical history that includes Eye surgery.   Her family history includes Arthritis in her mother; Coronary artery disease in an other family member; Diabetes in her brother; Heart disease in her mother; Heart disease (age of onset: 3940) in her father.She reports that she has never smoked. She does not have any smokeless tobacco history on file. She reports that she does not drink alcohol or use illicit drugs.  Current Outpatient Prescriptions on File Prior to Visit  Medication Sig Dispense Refill  . aspirin EC 325 MG EC tablet Take 325 mg by mouth daily.      . Cyanocobalamin (VITAMIN B-12 PO) Take 1 tablet by mouth daily.     . NONFORMULARY OR COMPOUNDED ITEM Compression stockings 20-30 mm hg  #1  Dx edema 1 each 0  . vitamin E (VITAMIN E) 1000 UNIT capsule Take 2,000 Units by mouth daily.     No current facility-administered medications on file prior to visit.     Objective:  Objective Physical Exam  Constitutional: She is oriented to person, place, and time. She appears well-developed and well-nourished. No distress.  HENT:  Right Ear: External ear normal.  Left Ear: External ear normal.  Nose: Nose normal.  Mouth/Throat: Oropharynx is clear and moist.  Eyes: EOM are normal. Pupils are  equal, round, and reactive to light.  Neck: Normal range of motion. Neck supple.  Cardiovascular: Normal rate, regular rhythm and normal heart sounds.   No murmur heard. Pulmonary/Chest: Effort normal and breath sounds normal. No respiratory distress. She has no wheezes. She has no rales. She exhibits no tenderness.  Musculoskeletal: She exhibits edema.  Tr edema R low leg-- pt wearing compression stockings  Neurological: She is alert and oriented to person, place, and time.  Psychiatric: She has a normal mood and affect. Her behavior is normal. Judgment and thought content normal.   BP 124/84 mmHg  Pulse 77  Temp(Src) 98 F (36.7 C) (Oral)  Resp 16  SpO2 98% Wt Readings from Last 3 Encounters:  12/16/14 150 lb 6.4 oz (68.221 kg)  07/16/14 151 lb 9.6 oz (68.765 kg)  02/17/14 158 lb (71.668 kg)     Lab Results  Component Value Date   WBC 5.5 09/04/2014   HGB 12.6 09/04/2014   HCT 37.8 09/04/2014   PLT 258.0 09/04/2014   GLUCOSE 98 01/06/2015   CHOL 167 01/06/2015   TRIG 134.0 01/06/2015   HDL 39.30 01/06/2015   LDLCALC 101* 01/06/2015   ALT 8 01/06/2015   AST 15 01/06/2015   NA 141 01/06/2015   K 3.7 01/06/2015   CL 102 01/06/2015   CREATININE 1.90* 01/06/2015   BUN 26* 01/06/2015   CO2 30 01/06/2015   TSH 1.89 01/06/2015   INR 1.03 07/10/2013  Dg Lumbar Spine Complete  12/16/2014   CLINICAL DATA:  Low back pain and radiculopathy on the right for 2 weeks. No known injury.  EXAM: LUMBAR SPINE - COMPLETE 4+ VIEW  COMPARISON:  Plain films lumbar spine 04/03/2006.  FINDINGS: Vertebral body height is maintained. There is new trace anterolisthesis L3 on L4 due to facet degenerative disease. Progressive loss of disc space height is seen at L4-5. Loss of disc space height lower thoracic spine and at L1-2 is not notably changed. Paraspinous structures demonstrate a large volume of stool in the colon.  IMPRESSION: No acute abnormality.  Progressive spondylosis.  Constipation.    Electronically Signed   By: Drusilla Kanner M.D.   On: 12/16/2014 17:04     Assessment & Plan:  Plan I have discontinued Ms. Yebra's diclofenac and losartan. I have also changed her levothyroxine. Additionally, I am having her maintain her aspirin EC, Cyanocobalamin (VITAMIN B-12 PO), vitamin E, NONFORMULARY OR COMPOUNDED ITEM, pravastatin, hydrochlorothiazide, and solifenacin.  Meds ordered this encounter  Medications  . hydrochlorothiazide (HYDRODIURIL) 25 MG tablet    Sig: Take 1 tablet (25 mg total) by mouth daily. Office visit due now for more refills    Dispense:  90 tablet    Refill:  3  . levothyroxine (SYNTHROID, LEVOTHROID) 88 MCG tablet    Sig: 1 po qd    Dispense:  90 tablet    Refill:  3  . solifenacin (VESICARE) 10 MG tablet    Sig: Take 1 tablet (10 mg total) by mouth daily.    Dispense:  90 tablet    Refill:  3    Problem List Items Addressed This Visit    Hypothyroidism    con't synthroid Lab Results  Component Value Date   TSH 1.89 01/06/2015         Relevant Medications   levothyroxine (SYNTHROID, LEVOTHROID) 88 MCG tablet    Other Visit Diagnoses    Essential hypertension    -  Primary    Relevant Medications    hydrochlorothiazide (HYDRODIURIL) 25 MG tablet    Other Relevant Orders    Basic metabolic panel    Hepatic function panel    Lipid panel    Incontinence        Relevant Medications    solifenacin (VESICARE) 10 MG tablet    Hyperlipidemia        Relevant Medications    hydrochlorothiazide (HYDRODIURIL) 25 MG tablet    Other Relevant Orders    Basic metabolic panel    Hepatic function panel    Lipid panel       Follow-up: Return in about 3 months (around 05/04/2015), or if symptoms worsen or fail to improve, for lab only.  Loreen Freud, DO

## 2015-02-04 ENCOUNTER — Other Ambulatory Visit: Payer: Self-pay | Admitting: Family Medicine

## 2015-02-23 ENCOUNTER — Other Ambulatory Visit: Payer: Self-pay | Admitting: Family Medicine

## 2015-05-04 ENCOUNTER — Other Ambulatory Visit (INDEPENDENT_AMBULATORY_CARE_PROVIDER_SITE_OTHER): Payer: Medicare Other

## 2015-05-04 DIAGNOSIS — I10 Essential (primary) hypertension: Secondary | ICD-10-CM | POA: Diagnosis not present

## 2015-05-04 DIAGNOSIS — E785 Hyperlipidemia, unspecified: Secondary | ICD-10-CM | POA: Diagnosis not present

## 2015-05-04 LAB — HEPATIC FUNCTION PANEL
ALBUMIN: 4.1 g/dL (ref 3.5–5.2)
ALT: 11 U/L (ref 0–35)
AST: 18 U/L (ref 0–37)
Alkaline Phosphatase: 95 U/L (ref 39–117)
Bilirubin, Direct: 0.1 mg/dL (ref 0.0–0.3)
Total Bilirubin: 0.5 mg/dL (ref 0.2–1.2)
Total Protein: 7.1 g/dL (ref 6.0–8.3)

## 2015-05-04 LAB — LIPID PANEL
Cholesterol: 150 mg/dL (ref 0–200)
HDL: 43.1 mg/dL (ref >39.00)
LDL Cholesterol: 85 mg/dL (ref 0–99)
NonHDL: 106.9
Total CHOL/HDL Ratio: 3
Triglycerides: 112 mg/dL (ref 0.0–149.0)
VLDL: 22.4 mg/dL (ref 0.0–40.0)

## 2015-05-04 LAB — BASIC METABOLIC PANEL
BUN: 26 mg/dL — ABNORMAL HIGH (ref 6–23)
CALCIUM: 9.5 mg/dL (ref 8.4–10.5)
CHLORIDE: 101 meq/L (ref 96–112)
CO2: 29 mEq/L (ref 19–32)
Creatinine, Ser: 1.81 mg/dL — ABNORMAL HIGH (ref 0.40–1.20)
GFR: 28.52 mL/min — AB (ref >60.00)
Glucose, Bld: 98 mg/dL (ref 70–99)
Potassium: 3 mEq/L — ABNORMAL LOW (ref 3.5–5.1)
Sodium: 141 mEq/L (ref 135–145)

## 2015-12-29 ENCOUNTER — Ambulatory Visit (INDEPENDENT_AMBULATORY_CARE_PROVIDER_SITE_OTHER): Payer: Medicare Other | Admitting: Medical

## 2015-12-29 ENCOUNTER — Encounter: Payer: Self-pay | Admitting: Medical

## 2015-12-29 VITALS — BP 120/70 | HR 77 | Temp 98.1°F | Ht 65.0 in | Wt 148.8 lb

## 2015-12-29 DIAGNOSIS — R0981 Nasal congestion: Secondary | ICD-10-CM

## 2015-12-29 DIAGNOSIS — J209 Acute bronchitis, unspecified: Secondary | ICD-10-CM | POA: Diagnosis not present

## 2015-12-29 MED ORDER — BENZONATATE 100 MG PO CAPS: 100.0000 mg | ORAL_CAPSULE | Freq: Three times a day (TID) | ORAL | Status: DC | PRN

## 2015-12-29 MED ORDER — FLUTICASONE PROPIONATE 50 MCG/ACT NA SUSP: 2.0000 | Freq: Every day | NASAL | Status: DC

## 2015-12-29 MED ORDER — AZITHROMYCIN 250 MG PO TABS: ORAL_TABLET | ORAL | Status: DC

## 2015-12-29 NOTE — Progress Notes (Signed)
Subjective:    Patient ID: Belinda Anderson, female    DOB: [birth year], 82 y.o.   MRN: [id number]  HPI  Pt in with nasal congestion for almost a week. States head feel full. When she blows her nose in the am will blow some  colored mucous.  Mild fatigue entire week. No flu like achiness.   No ear pain. No fever,no chills or sweats. No sneezing.  Coughing up some mucous in the am. Thick yellow brown at times.   Review of Systems  Constitutional: Positive for fatigue. Negative for fever and chills.       Mild fatigue  HENT: Positive for congestion. Negative for ear pain, mouth sores, postnasal drip, rhinorrhea and sinus pressure.   Respiratory: Positive for cough. Negative for choking, shortness of breath and wheezing.   Cardiovascular: Negative for chest pain and palpitations.  Gastrointestinal: Negative for abdominal pain.  Musculoskeletal: Negative for back pain.  Skin: Negative for rash.  Neurological: Negative for dizziness, speech difficulty, weakness and headaches.  Hematological: Negative for adenopathy. Does not bruise/bleed easily.  Psychiatric/Behavioral: Negative for behavioral problems and confusion.    Past Medical History  Diagnosis Date  . Hypertension   . Thyroid disease   . Arthritis     Social History   Social History  . Marital Status: Widowed    Spouse Name: N/A  . Number of Children: N/A  . Years of Education: N/A   Occupational History  . Not on file.   Social History Main Topics  . Smoking status: Never Smoker   . Smokeless tobacco: Not on file  . Alcohol Use: No  . Drug Use: No  . Sexual Activity: Not on file   Other Topics Concern  . Not on file   Social History Narrative   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mowing lawn, Data processing managercleaning gutters    Past Surgical History  Procedure Laterality Date  . Eye surgery      lens implant    Family History  Problem Relation Age of Onset  . Coronary artery disease    . Diabetes Brother    DI  . Arthritis Mother   . Heart disease Mother   . Heart disease Father 9740    MI    No Known Allergies  Current Outpatient Prescriptions on File Prior to Visit  Medication Sig Dispense Refill  . aspirin EC 325 MG EC tablet Take 325 mg by mouth daily.      . Cyanocobalamin (VITAMIN B-12 PO) Take 1 tablet by mouth daily.     . hydrochlorothiazide (HYDRODIURIL) 25 MG tablet Take 1 tablet (25 mg total) by mouth daily. Office visit due now for more refills 90 tablet 3  . levothyroxine (SYNTHROID, LEVOTHROID) 88 MCG tablet take 1 tablet once daily BEFORE BREAKFAST 30 tablet 5  . NONFORMULARY OR COMPOUNDED ITEM Compression stockings 20-30 mm hg  #1  Dx edema 1 each 0  . pravastatin (PRAVACHOL) 20 MG tablet Take 1 tablet (20 mg total) by mouth daily. 30 tablet 5  . solifenacin (VESICARE) 10 MG tablet Take 1 tablet (10 mg total) by mouth daily. 90 tablet 3  . vitamin E (VITAMIN E) 1000 UNIT capsule Take 2,000 Units by mouth daily.     No current facility-administered medications on file prior to visit.    BP 120/70 mmHg  Pulse 77  Temp(Src) 98.1 F (36.7 C) (Oral)  Ht 5\' 5"  (1.651 m)  Wt 148 lb 12.8 oz (67.495 kg)  BMI 24.76 kg/m2  SpO2 97%       Objective:   Physical Exam  General  Mental Status - Alert. General Appearance - Well groomed. Not in acute distress. Sounds nasal congested.  Skin Rashes- No Rashes.  HEENT Head- Normal. Ear Auditory Canal - Left- Normal. Right - Normal.Tympanic Membrane- Left- Normal. Right- Normal. Eye Sclera/Conjunctiva- Left- Normal. Right- Normal. Nose & Sinuses Nasal Mucosa- Left-  Boggy and Congested. Right-  Boggy and  Congested.Bilateral no  maxillary and no frontal sinus pressure. Mouth & Throat Lips: Upper Lip- Normal: no dryness, cracking, pallor, cyanosis, or vesicular eruption. Lower Lip-Normal: no dryness, cracking, pallor, cyanosis or vesicular eruption. Buccal Mucosa- Bilateral- No Aphthous ulcers. Oropharynx- No Discharge or  Erythema. Tonsils: Characteristics- Bilateral- No Erythema or Congestion. Size/Enlargement- Bilateral- No enlargement. Discharge- bilateral-None.  Neck Neck- Supple. No Masses.   Chest and Lung Exam Auscultation: Breath Sounds:-Clear even and unlabored.  Cardiovascular Auscultation:Rythm- Regular, rate and rhythm. Murmurs & Other Heart Sounds:Ausculatation of the heart reveal- No Murmurs.  Lymphatic Head & Neck General Head & Neck Lymphatics: Bilateral: Description- No Localized lymphadenopathy.       Assessment & Plan:  You appear to have uri type symptoms for about a week but progressively getting worse and some concern now for bronchitis.  Rx azithromycin antibiotic. Benzonatate for cough. Flonase for nasal congestion.  If you are not progressively improving then notify us and would get chest xray.  Follow up in 7 days or as needed

## 2015-12-29 NOTE — Progress Notes (Signed)
Pre visit review using our clinic review tool, if applicable. No additional management support is needed unless otherwise documented below in the visit note. 

## 2015-12-29 NOTE — Patient Instructions (Addendum)
You appear to have uri type symptoms for about a week but progressively getting worse and some concern now for bronchitis.  Rx azithromycin antibiotic. Benzonatate for cough. Flonase for nasal congestion.  If you are not progressively improving then notify us and would get chest xray.  Follow up in 7 days or as needed

## 2016-02-18 ENCOUNTER — Other Ambulatory Visit: Payer: Self-pay | Admitting: Family Medicine

## 2016-03-08 ENCOUNTER — Telehealth: Payer: Self-pay | Admitting: Family Medicine

## 2016-03-08 DIAGNOSIS — I10 Essential (primary) hypertension: Secondary | ICD-10-CM

## 2016-03-08 MED ORDER — HYDROCHLOROTHIAZIDE 25 MG PO TABS: 25.0000 mg | ORAL_TABLET | Freq: Every day | ORAL | Status: DC

## 2016-03-08 NOTE — Telephone Encounter (Signed)
Agree-- refill only 1 month

## 2016-03-08 NOTE — Telephone Encounter (Signed)
Can be reached: [phone] Pharmacy: [id number] - [place] - [id number]  Reason for call: pt needing refill on hydrochlorothiazide and on levothyroxine. Pt has 3 days on hand.

## 2016-03-08 NOTE — Telephone Encounter (Signed)
Refilled patients medication spoke with patient and let her know she needs to come in to be seen has not been in since April 2016 for a follow up with dr. Laury Axonlowne in regards to medication management.

## 2016-03-08 NOTE — Telephone Encounter (Signed)
done

## 2016-03-15 ENCOUNTER — Ambulatory Visit (INDEPENDENT_AMBULATORY_CARE_PROVIDER_SITE_OTHER): Payer: Medicare Other | Admitting: Family Medicine

## 2016-03-15 ENCOUNTER — Encounter: Payer: Self-pay | Admitting: Family Medicine

## 2016-03-15 VITALS — BP 124/82 | HR 71 | Temp 98.1°F | Ht 65.0 in | Wt 146.6 lb

## 2016-03-15 DIAGNOSIS — E785 Hyperlipidemia, unspecified: Secondary | ICD-10-CM

## 2016-03-15 DIAGNOSIS — Z23 Encounter for immunization: Secondary | ICD-10-CM

## 2016-03-15 DIAGNOSIS — R32 Unspecified urinary incontinence: Secondary | ICD-10-CM | POA: Diagnosis not present

## 2016-03-15 DIAGNOSIS — E038 Other specified hypothyroidism: Secondary | ICD-10-CM | POA: Diagnosis not present

## 2016-03-15 DIAGNOSIS — I10 Essential (primary) hypertension: Secondary | ICD-10-CM | POA: Diagnosis not present

## 2016-03-15 LAB — COMPREHENSIVE METABOLIC PANEL
ALBUMIN: 4.1 g/dL (ref 3.5–5.2)
ALK PHOS: 89 U/L (ref 39–117)
ALT: 11 U/L (ref 0–35)
AST: 19 U/L (ref 0–37)
BUN: 23 mg/dL (ref 6–23)
CO2: 32 mEq/L (ref 19–32)
CREATININE: 1.43 mg/dL — AB (ref 0.40–1.20)
Calcium: 9.5 mg/dL (ref 8.4–10.5)
Chloride: 103 mEq/L (ref 96–112)
GFR: 37.35 mL/min — ABNORMAL LOW (ref >60.00)
GLUCOSE: 86 mg/dL (ref 70–99)
Potassium: 3 mEq/L — ABNORMAL LOW (ref 3.5–5.1)
SODIUM: 141 meq/L (ref 135–145)
TOTAL PROTEIN: 6.8 g/dL (ref 6.0–8.3)
Total Bilirubin: 0.5 mg/dL (ref 0.2–1.2)

## 2016-03-15 LAB — LIPID PANEL
CHOL/HDL RATIO: 5
Cholesterol: 206 mg/dL — ABNORMAL HIGH (ref 0–200)
HDL: 40.4 mg/dL (ref >39.00)
LDL Cholesterol: 133 mg/dL — ABNORMAL HIGH (ref 0–99)
NONHDL: 166.06
Triglycerides: 166 mg/dL — ABNORMAL HIGH (ref 0.0–149.0)
VLDL: 33.2 mg/dL (ref 0.0–40.0)

## 2016-03-15 LAB — TSH: TSH: 1.18 u[IU]/mL (ref 0.35–4.50)

## 2016-03-15 MED ORDER — HYDROCHLOROTHIAZIDE 25 MG PO TABS: 25.0000 mg | ORAL_TABLET | Freq: Every day | ORAL | Status: DC

## 2016-03-15 MED ORDER — SOLIFENACIN SUCCINATE 10 MG PO TABS: 10.0000 mg | ORAL_TABLET | Freq: Every day | ORAL | Status: DC

## 2016-03-15 MED ORDER — LEVOTHYROXINE SODIUM 88 MCG PO TABS: 88.0000 ug | ORAL_TABLET | Freq: Every day | ORAL | Status: DC

## 2016-03-15 MED ORDER — PRAVASTATIN SODIUM 20 MG PO TABS: 20.0000 mg | ORAL_TABLET | Freq: Every day | ORAL | Status: DC

## 2016-03-15 NOTE — Patient Instructions (Signed)

## 2016-03-15 NOTE — Assessment & Plan Note (Signed)
Stable con't meds See med list

## 2016-03-15 NOTE — Progress Notes (Signed)
Pre visit review using our clinic review tool, if applicable. No additional management support is needed unless otherwise documented below in the visit note. 

## 2016-03-15 NOTE — Progress Notes (Signed)
Patient ID: Belinda Anderson, female    DOB: 1932/04/03  Age: 82 y.o. MRN: [id number]    Subjective:  Subjective HPI Belinda Anderson presents for f/u thyroid, and cholesterol.  No complaints.    Review of Systems  Constitutional: Negative for diaphoresis, appetite change, fatigue and unexpected weight change.  Eyes: Negative for pain, redness and visual disturbance.  Respiratory: Negative for cough, chest tightness, shortness of breath and wheezing.   Cardiovascular: Negative for chest pain, palpitations and leg swelling.  Endocrine: Negative for cold intolerance, heat intolerance, polydipsia, polyphagia and polyuria.  Genitourinary: Negative for dysuria, frequency and difficulty urinating.  Neurological: Negative for dizziness, light-headedness, numbness and headaches.    History Past Medical History  Diagnosis Date  . Hypertension   . Thyroid disease   . Arthritis     She has past surgical history that includes Eye surgery.   Her family history includes Arthritis in her mother; Diabetes in her brother; Heart disease in her mother; Heart disease (age of onset: 5440) in her father.She reports that she has never smoked. She does not have any smokeless tobacco history on file. She reports that she does not drink alcohol or use illicit drugs.  Current Outpatient Prescriptions on File Prior to Visit  Medication Sig Dispense Refill  . aspirin EC 325 MG EC tablet Take 325 mg by mouth daily.      . Cyanocobalamin (VITAMIN B-12 PO) Take 1 tablet by mouth daily.     . fluticasone (FLONASE) 50 MCG/ACT nasal spray Place 2 sprays into both nostrils daily. 16 g 1  . NONFORMULARY OR COMPOUNDED ITEM Compression stockings 20-30 mm hg  #1  Dx edema 1 each 0  . vitamin E (VITAMIN E) 1000 UNIT capsule Take 2,000 Units by mouth daily.     No current facility-administered medications on file prior to visit.     Objective:  Objective Physical Exam  Constitutional: She is oriented to person, place,  and time. She appears well-developed and well-nourished.  HENT:  Head: Normocephalic and atraumatic.  Eyes: Conjunctivae and EOM are normal.  Neck: Normal range of motion. Neck supple. No JVD present. Carotid bruit is not present. No thyromegaly present.  Cardiovascular: Normal rate, regular rhythm and normal heart sounds.   No murmur heard. Pulmonary/Chest: Effort normal and breath sounds normal. No respiratory distress. She has no wheezes. She has no rales. She exhibits no tenderness.  Musculoskeletal: She exhibits edema.  Trace pitting edema b/l r >l  Neurological: She is alert and oriented to person, place, and time.  Psychiatric: She has a normal mood and affect. Her behavior is normal.  Nursing note and vitals reviewed.  BP 124/82 mmHg  Pulse 71  Temp(Src) 98.1 F (36.7 C) (Oral)  Ht 5\' 5"  (1.651 m)  Wt 146 lb 9.6 oz (66.497 kg)  BMI 24.40 kg/m2  SpO2 97% Wt Readings from Last 3 Encounters:  03/15/16 146 lb 9.6 oz (66.497 kg)  12/29/15 148 lb 12.8 oz (67.495 kg)  12/16/14 150 lb 6.4 oz (68.221 kg)     Lab Results  Component Value Date   WBC 5.5 09/04/2014   HGB 12.6 09/04/2014   HCT 37.8 09/04/2014   PLT 258.0 09/04/2014   GLUCOSE 86 03/15/2016   CHOL 206* 03/15/2016   TRIG 166.0* 03/15/2016   HDL 40.40 03/15/2016   LDLCALC 133* 03/15/2016   ALT 11 03/15/2016   AST 19 03/15/2016   NA 141 03/15/2016   K 3.0* 03/15/2016   CL 103 03/15/2016  CREATININE 1.43* 03/15/2016   BUN 23 03/15/2016   CO2 32 03/15/2016   TSH 1.18 03/15/2016   INR 1.03 07/10/2013    Dg Lumbar Spine Complete  12/16/2014  CLINICAL DATA:  Low back pain and radiculopathy on the right for 2 weeks. No known injury. EXAM: LUMBAR SPINE - COMPLETE 4+ VIEW COMPARISON:  Plain films lumbar spine 04/03/2006. FINDINGS: Vertebral body height is maintained. There is new trace anterolisthesis L3 on L4 due to facet degenerative disease. Progressive loss of disc space height is seen at L4-5. Loss of disc  space height lower thoracic spine and at L1-2 is not notably changed. Paraspinous structures demonstrate a large volume of stool in the colon. IMPRESSION: No acute abnormality. Progressive spondylosis. Constipation. Electronically Signed   By: Drusilla Kanner M.D.   On: 12/16/2014 17:04     Assessment & Plan:  Plan I have discontinued Belinda Anderson's azithromycin and benzonatate. I have also changed her levothyroxine and hydrochlorothiazide. Additionally, I am having her maintain her aspirin EC, Cyanocobalamin (VITAMIN B-12 PO), vitamin E, NONFORMULARY OR COMPOUNDED ITEM, fluticasone, solifenacin, and pravastatin.  Meds ordered this encounter  Medications  . solifenacin (VESICARE) 10 MG tablet    Sig: Take 1 tablet (10 mg total) by mouth daily.    Dispense:  90 tablet    Refill:  3  . pravastatin (PRAVACHOL) 20 MG tablet    Sig: Take 1 tablet (20 mg total) by mouth daily.    Dispense:  90 tablet    Refill:  1  . levothyroxine (SYNTHROID, LEVOTHROID) 88 MCG tablet    Sig: Take 1 tablet (88 mcg total) by mouth daily.    Dispense:  90 tablet    Refill:  3  . hydrochlorothiazide (HYDRODIURIL) 25 MG tablet    Sig: Take 1 tablet (25 mg total) by mouth daily.    Dispense:  90 tablet    Refill:  3    Problem List Items Addressed This Visit      Unprioritized   Essential hypertension    Stable con't meds See med list      Relevant Medications   pravastatin (PRAVACHOL) 20 MG tablet   hydrochlorothiazide (HYDRODIURIL) 25 MG tablet    Other Visit Diagnoses    Incontinence    -  Primary    Relevant Medications    solifenacin (VESICARE) 10 MG tablet    Other specified hypothyroidism        Relevant Medications    levothyroxine (SYNTHROID, LEVOTHROID) 88 MCG tablet    Other Relevant Orders    Comprehensive metabolic panel (Completed)    TSH (Completed)    Hyperlipidemia LDL goal <100        Relevant Medications    pravastatin (PRAVACHOL) 20 MG tablet    hydrochlorothiazide  (HYDRODIURIL) 25 MG tablet    Other Relevant Orders    Comprehensive metabolic panel (Completed)    Lipid panel (Completed)    Need for pneumococcal vaccination        Relevant Orders    Pneumococcal conjugate vaccine 13-valent (Completed)       Follow-up: Return in about 6 months (around 09/15/2016), or if symptoms worsen or fail to improve, for annual exam, fasting.  [name], DO

## 2016-03-18 ENCOUNTER — Telehealth: Payer: Self-pay | Admitting: *Deleted

## 2016-03-18 DIAGNOSIS — R7989 Other specified abnormal findings of blood chemistry: Secondary | ICD-10-CM

## 2016-03-18 DIAGNOSIS — E876 Hypokalemia: Secondary | ICD-10-CM

## 2016-03-18 MED ORDER — POTASSIUM CHLORIDE CRYS ER 20 MEQ PO TBCR: 20.0000 meq | EXTENDED_RELEASE_TABLET | Freq: Every day | ORAL | Status: DC

## 2016-03-18 NOTE — Telephone Encounter (Signed)
-----   Message from Donato SchultzYvonne R Lowne Chase, DO sent at 03/18/2016  8:38 AM EDT ----- Potassium is very low--- KCL 20 meq 1 a day#30  2 refills  Recheck bmp 2 weeks Cr -- better but still elevated-- did you ever see the nephrologist?  A referral was put in 2 years ago

## 2016-03-18 NOTE — Telephone Encounter (Signed)
Noted Ok for referral

## 2016-03-18 NOTE — Telephone Encounter (Signed)
Called and spoke with the pt's daughter Belinda Lias(Marie) and informed her of the pt's recent lab results and note.  She verbalized understanding.  The daughter stated that the pt has not seen a Nephrologist, and she feels it would be a good thing for the pt to do if her Creatinine is high.  So she would like for us to place a referral the to Nephrologist.  The daughter also mentioned that the pt went to the pharmacy to pick up the prescription for Vesicare and was told the prescription would be $400.00.  The daughter stated that the pt will not be taking the Vesicare, and she also said that the pt will not be taking the Pravastatin because cholesterol medications causes memory issues.  Please advise.//AB/CMA

## 2016-03-18 NOTE — Telephone Encounter (Signed)
Called and spoke with the pt and informed her of recent lab results and note.  Pt verbalized understanding and agreed.  Pt stated that she does not remember if she saw a Nephrologist or not.  She said her memory is not good.  Pt stated that she will ask her daughter to see if she has seen a Nephrologist or not.  Asked the pt to give us a call back to let us know so we can get her in with a Nephrologist.  Pt verbalized understanding and stated that she will give us a call back.  New prescription sent to the pharmacy by e-script.  Future lab ordered and sent.   Pt was scheduled a lab appt for a repeat BMP in 2 weeks on (Friday-04/01/16 @ 10:00am).//AB/CMA

## 2016-03-18 NOTE — Telephone Encounter (Signed)
Ref has been placed.     KP 

## 2016-04-01 ENCOUNTER — Other Ambulatory Visit (INDEPENDENT_AMBULATORY_CARE_PROVIDER_SITE_OTHER): Payer: Medicare Other

## 2016-04-01 DIAGNOSIS — E876 Hypokalemia: Secondary | ICD-10-CM

## 2016-04-01 LAB — BASIC METABOLIC PANEL
BUN: 25 mg/dL — ABNORMAL HIGH (ref 6–23)
CALCIUM: 9.7 mg/dL (ref 8.4–10.5)
CHLORIDE: 101 meq/L (ref 96–112)
CO2: 33 meq/L — AB (ref 19–32)
Creatinine, Ser: 1.48 mg/dL — ABNORMAL HIGH (ref 0.40–1.20)
GFR: 35.9 mL/min — ABNORMAL LOW (ref >60.00)
Glucose, Bld: 94 mg/dL (ref 70–99)
Potassium: 3.3 mEq/L — ABNORMAL LOW (ref 3.5–5.1)
SODIUM: 142 meq/L (ref 135–145)

## 2016-04-08 ENCOUNTER — Telehealth: Payer: Self-pay | Admitting: Family Medicine

## 2016-04-11 NOTE — Telephone Encounter (Signed)
error:[id number] ° °

## 2016-05-16 ENCOUNTER — Other Ambulatory Visit: Payer: Self-pay | Admitting: Nephrology

## 2016-05-16 DIAGNOSIS — N189 Chronic kidney disease, unspecified: Secondary | ICD-10-CM

## 2016-05-19 ENCOUNTER — Ambulatory Visit
Admission: RE | Admit: 2016-05-19 | Discharge: 2016-05-19 | Disposition: A | Discharge status: Home / Self Care | Payer: Medicare Other | Source: Ambulatory Visit | Attending: Nephrology | Admitting: Nephrology

## 2016-05-19 DIAGNOSIS — N189 Chronic kidney disease, unspecified: Secondary | ICD-10-CM

## 2016-06-27 ENCOUNTER — Other Ambulatory Visit: Payer: Self-pay | Admitting: Family Medicine

## 2016-06-27 DIAGNOSIS — E876 Hypokalemia: Secondary | ICD-10-CM

## 2016-09-22 ENCOUNTER — Ambulatory Visit (INDEPENDENT_AMBULATORY_CARE_PROVIDER_SITE_OTHER): Payer: Medicare Other | Admitting: Family Medicine

## 2016-09-22 VITALS — BP 139/52 | HR 88 | Temp 98.2°F | Ht 65.0 in | Wt 146.8 lb

## 2016-09-22 DIAGNOSIS — J209 Acute bronchitis, unspecified: Secondary | ICD-10-CM

## 2016-09-22 MED ORDER — BENZONATATE 100 MG PO CAPS: 100.0000 mg | ORAL_CAPSULE | Freq: Three times a day (TID) | ORAL | 0 refills | Status: DC | PRN

## 2016-09-22 NOTE — Progress Notes (Signed)
Pre visit review using our clinic tool,if applicable. No additional management support is needed unless otherwise documented below in the visit note.  

## 2016-09-22 NOTE — Patient Instructions (Addendum)
Claritin (loratadine), Allegra (fexofenadine), Zyrtec (cetirizine); these are listed in order from weakest to strongest. Generic, and therefore cheaper, options are in the parentheses.   There are available OTC, and the generic versions, which may be cheaper, are in parentheses. Show this to a pharmacist if you have trouble finding any of these items.  

## 2016-09-22 NOTE — Progress Notes (Signed)
Chief Complaint  Patient presents with  . Cough    non productive    Belinda MessElizabeth Anderson here for URI complaints.  Duration: 1 day  Associated symptoms: cough, runny nose Denies: subjective fever, sinus congestion, itchy watery eyes, ear pain, ear drainage, sore throat, shortness of breath and myalgia Treatment to date: Inhaling steam Sick contacts: No  ROS:  Const: Denies fevers HEENT: As noted in HPI Lungs: No SOB  Past Medical History:  Diagnosis Date  . Arthritis   . Hypertension   . Thyroid disease    Family History  Problem Relation Age of Onset  . Coronary artery disease    . Diabetes Brother     DI  . Arthritis Mother   . Heart disease Mother   . Heart disease Father 40    MI    BP (!) 139/52   Pulse 88   Temp 98.2 F (36.8 C) (Oral)   Ht 5\' 5"  (1.651 m)   Wt 146 lb 12.8 oz (66.6 kg)   SpO2 97%   BMI 24.43 kg/m  General: Awake, alert, appears stated age HEENT: AT, [place], R ear canal 80% obstructed w cerumen, L ears patent and b/l TM's neg, nares patent w/o discharge, pharynx pink and without exudates, MMM Neck: No masses or asymmetry Heart: RRR, no murmurs, no bruits Lungs: CTAB, no accessory muscle use Psych: Age appropriate judgment and insight, normal mood and affect  Acute bronchitis, unspecified organism - Plan: benzonatate (TESSALON) 100 MG capsule  Orders as above. Recommended OTC antihistamine to help with rhinorrhea.  Fever, SOB or worsening symptoms, Belinda Anderson is to seek care or let us know. F/u prn otherwise. Pt voiced understanding and agreement to the plan.  Jilda Rocheicholas Paul KnollwoodWendling, DO 09/22/16 10:11 AM

## 2016-09-24 ENCOUNTER — Other Ambulatory Visit: Payer: Self-pay | Admitting: Family Medicine

## 2016-09-24 DIAGNOSIS — E785 Hyperlipidemia, unspecified: Secondary | ICD-10-CM

## 2016-11-21 ENCOUNTER — Other Ambulatory Visit: Payer: Self-pay | Admitting: Family Medicine

## 2016-11-21 ENCOUNTER — Telehealth: Payer: Self-pay | Admitting: Family Medicine

## 2016-11-21 DIAGNOSIS — M549 Dorsalgia, unspecified: Secondary | ICD-10-CM

## 2016-11-21 NOTE — Telephone Encounter (Signed)
Referral to sports medicine done

## 2016-11-21 NOTE — Telephone Encounter (Signed)
Sport med

## 2016-11-21 NOTE — Telephone Encounter (Signed)
Relation to WJ:XBJYpt:self Call back number:[phone]  Reason for call:  Patient requesting a referral for back pain, patient denied appointment and would [facility], please advise

## 2016-11-23 ENCOUNTER — Ambulatory Visit (HOSPITAL_BASED_OUTPATIENT_CLINIC_OR_DEPARTMENT_OTHER)
Admission: RE | Admit: 2016-11-23 | Discharge: 2016-11-23 | Disposition: A | Discharge status: Home / Self Care | Payer: Medicare Other | Source: Ambulatory Visit | Attending: Family Medicine | Admitting: Family Medicine

## 2016-11-23 ENCOUNTER — Ambulatory Visit (INDEPENDENT_AMBULATORY_CARE_PROVIDER_SITE_OTHER): Payer: Medicare Other | Admitting: Family Medicine

## 2016-11-23 ENCOUNTER — Encounter: Payer: Self-pay | Admitting: Family Medicine

## 2016-11-23 VITALS — BP 115/76 | HR 83 | Ht 67.0 in | Wt 146.0 lb

## 2016-11-23 DIAGNOSIS — I700 Atherosclerosis of aorta: Secondary | ICD-10-CM | POA: Diagnosis not present

## 2016-11-23 DIAGNOSIS — M5441 Lumbago with sciatica, right side: Secondary | ICD-10-CM | POA: Diagnosis not present

## 2016-11-23 DIAGNOSIS — M4316 Spondylolisthesis, lumbar region: Secondary | ICD-10-CM | POA: Insufficient documentation

## 2016-11-23 DIAGNOSIS — M545 Low back pain, unspecified: Secondary | ICD-10-CM

## 2016-11-23 DIAGNOSIS — M5134 Other intervertebral disc degeneration, thoracic region: Secondary | ICD-10-CM | POA: Insufficient documentation

## 2016-11-23 DIAGNOSIS — M5136 Other intervertebral disc degeneration, lumbar region: Secondary | ICD-10-CM | POA: Diagnosis not present

## 2016-11-23 MED ORDER — PREDNISONE 10 MG PO TABS: ORAL_TABLET | ORAL | 0 refills | Status: DC

## 2016-11-23 MED ORDER — HYDROCODONE-ACETAMINOPHEN 5-325 MG PO TABS: 1.0000 | ORAL_TABLET | Freq: Four times a day (QID) | ORAL | 0 refills | Status: DC | PRN

## 2016-11-23 NOTE — Patient Instructions (Signed)
You have lumbar radiculopathy (a pinched nerve in your low back). A prednisone dose pack is the best option for immediate relief and may be prescribed - take this as directed for 6 days. Norco as needed for severe pain - break this in half first and take it - if you do ok with that but it isn't helping you enough you can take the other half 30 minutes later. Stay as active as possible. Physical therapy has been shown to be helpful as well. Strengthening of low back muscles, abdominal musculature are key for long term pain relief. If not improving, will consider further imaging (MRI). Call me on Monday at the latest to let me know how you're doing.

## 2016-11-28 ENCOUNTER — Telehealth: Payer: Self-pay | Admitting: Family Medicine

## 2016-11-28 DIAGNOSIS — M545 Low back pain, unspecified: Secondary | ICD-10-CM | POA: Insufficient documentation

## 2016-11-28 DIAGNOSIS — M79605 Pain in left leg: Secondary | ICD-10-CM | POA: Insufficient documentation

## 2016-11-28 NOTE — Progress Notes (Signed)
PCP and consultation requested by: Belinda SchultzYvonne R Lowne Chase, DO  Subjective:   HPI: Patient is a 83 y.o. female here for low back pain.  Patient reports she's had about 2 weeks of worsening midline low back pain radiating to right side into buttock and down to foot. No prior history of similar problem. Pain is 9/10, sharp. No numbness or tingling. Using heating pad. No injury or trauma. Difficulty sleeping, getting comfortable due to pain. No skin changes. No bowel/bladder dysfunction.  Past Medical History:  Diagnosis Date  . Arthritis   . Hypertension   . Thyroid disease     Current Outpatient Prescriptions on File Prior to Visit  Medication Sig Dispense Refill  . aspirin EC 325 MG EC tablet Take 325 mg by mouth daily.      . benzonatate (TESSALON) 100 MG capsule Take 1 capsule (100 mg total) by mouth 3 (three) times daily as needed for cough. 20 capsule 0  . Cyanocobalamin (VITAMIN B-12 PO) Take 1 tablet by mouth daily.     . fluticasone (FLONASE) 50 MCG/ACT nasal spray Place 2 sprays into both nostrils daily. 16 g 1  . hydrochlorothiazide (HYDRODIURIL) 25 MG tablet Take 1 tablet (25 mg total) by mouth daily. 90 tablet 3  . levothyroxine (SYNTHROID, LEVOTHROID) 88 MCG tablet Take 1 tablet (88 mcg total) by mouth daily. 90 tablet 3  . NONFORMULARY OR COMPOUNDED ITEM Compression stockings 20-30 mm hg  #1  Dx edema 1 each 0  . potassium chloride SA (K-DUR,KLOR-CON) 20 MEQ tablet take 1 tablet by mouth once daily 30 tablet 5  . pravastatin (PRAVACHOL) 20 MG tablet take 1 tablet by mouth once daily 90 tablet 1  . solifenacin (VESICARE) 10 MG tablet Take 1 tablet (10 mg total) by mouth daily. 90 tablet 3  . vitamin E (VITAMIN E) 1000 UNIT capsule Take 2,000 Units by mouth daily.     No current facility-administered medications on file prior to visit.     Past Surgical History:  Procedure Laterality Date  . EYE SURGERY     lens implant    No Known Allergies  Social History    Social History  . Marital status: Widowed    Spouse name: N/A  . Number of children: N/A  . Years of education: N/A   Occupational History  . Not on file.   Social History Main Topics  . Smoking status: Never Smoker  . Smokeless tobacco: Never Used  . Alcohol use No  . Drug use: No  . Sexual activity: Not on file   Other Topics Concern  . 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 Coronary artery disease    . Diabetes Brother     DI  . Arthritis Mother   . Heart disease Mother   . Heart disease Father 40    MI    BP 115/76   Pulse 83   Ht 5\' 7"  (1.702 m)   Wt 146 lb (66.2 kg)   BMI 22.87 kg/m   Review of Systems: See HPI above.     Objective:  Physical Exam:  Gen: NAD, comfortable in exam room  Back: No gross deformity, scoliosis. TTP midline and just right of midline lumbar paraspinal muscles.  No bony tenderness however. Pain on extension > flexion. Strength LEs 5/5 all muscle groups. 2+ MSRs  in patellar and achilles tendons, equal bilaterally. Positive SLR on right, negative left. Sensation intact to light touch bilaterally. Negative logroll bilateral hips   Assessment & Plan:  1. Low back pain - independently reviewed radiographs - no evidence compression fracture, malignancy.  Consistent with lumbar radiculopathy - she will start with prednisone dose pack with norco as needed - advised to start with half a tablet to see how she responds before taking full tablets.  Call us in a week to let us know how she's doing.  Consider MRI if not improving.

## 2016-11-28 NOTE — Assessment & Plan Note (Signed)
independently reviewed radiographs - no evidence compression fracture, malignancy.  Consistent with lumbar radiculopathy - she will start with prednisone dose pack with norco as needed - advised to start with half a tablet to see how she responds before taking full tablets.  Call us in a week to let us know how she's doing.  Consider MRI if not improving.

## 2016-11-28 NOTE — Telephone Encounter (Signed)
Great, thanks

## 2016-11-28 NOTE — Telephone Encounter (Signed)
That's great to hear!  Belinda Anderson, can you touch base with her and see if she wants to do physical therapy now that she's feeling better or if she just wants to see how she does?  Thanks!

## 2016-11-28 NOTE — Telephone Encounter (Signed)
Spoke to patient and she wants to wait and see how she does. She will contact our office when she is ready to do physical therapy.

## 2016-12-21 ENCOUNTER — Telehealth: Payer: Self-pay | Admitting: Family Medicine

## 2016-12-21 NOTE — Telephone Encounter (Signed)
Patient's daughter calling stating pt has been experiencing pain in her lower back again for the past couple of weeks   Patient's daughter requesting an MRI as previously discussed at [facility] on 2/7  Pt's daughter understands re-evaluation may be needed before MRI order can be placed and would like to be followed up with at her cell number [phone]

## 2016-12-21 NOTE — Telephone Encounter (Signed)
Ok to go ahead with lumbar spine MRI to further assess, thanks!

## 2016-12-21 NOTE — Telephone Encounter (Signed)
Spoke to daughter and told her that I would work on setting up the MRI.

## 2016-12-23 NOTE — Addendum Note (Signed)
Addended by: Kathi SimpersWISE, Mahayla Haddaway F on: 12/23/2016 09:11 AM   Modules accepted: Orders

## 2016-12-31 ENCOUNTER — Ambulatory Visit (HOSPITAL_BASED_OUTPATIENT_CLINIC_OR_DEPARTMENT_OTHER)
Admission: RE | Admit: 2016-12-31 | Discharge: 2016-12-31 | Disposition: A | Discharge status: Home / Self Care | Payer: Medicare Other | Source: Ambulatory Visit | Attending: Family Medicine | Admitting: Family Medicine

## 2016-12-31 DIAGNOSIS — M5126 Other intervertebral disc displacement, lumbar region: Secondary | ICD-10-CM | POA: Diagnosis not present

## 2016-12-31 DIAGNOSIS — M47817 Spondylosis without myelopathy or radiculopathy, lumbosacral region: Secondary | ICD-10-CM | POA: Insufficient documentation

## 2016-12-31 DIAGNOSIS — M5136 Other intervertebral disc degeneration, lumbar region: Secondary | ICD-10-CM | POA: Insufficient documentation

## 2016-12-31 DIAGNOSIS — M48061 Spinal stenosis, lumbar region without neurogenic claudication: Secondary | ICD-10-CM | POA: Insufficient documentation

## 2016-12-31 DIAGNOSIS — M5441 Lumbago with sciatica, right side: Secondary | ICD-10-CM | POA: Insufficient documentation

## 2016-12-31 DIAGNOSIS — M2578 Osteophyte, vertebrae: Secondary | ICD-10-CM | POA: Diagnosis not present

## 2017-01-03 ENCOUNTER — Telehealth: Payer: Self-pay | Admitting: Family Medicine

## 2017-01-03 NOTE — Telephone Encounter (Signed)
Patient's returning call from provider regarding MRI results.  Daughter is requesting a call back at [phone]

## 2017-01-04 NOTE — Telephone Encounter (Signed)
Spoke with patient's daughter Hilda LiasMarie about MRI - states prednisone had helped, is taking pain medication.  Reviewed results - most likely cause is the L3-4 facet joints where she has inflammation, edema.  Will go ahead with injections both sides.  Daughter to call me a week after this to let me know how she's doing.

## 2017-01-06 ENCOUNTER — Telehealth: Payer: Self-pay | Admitting: Family Medicine

## 2017-01-06 ENCOUNTER — Other Ambulatory Visit: Payer: Self-pay | Admitting: Family Medicine

## 2017-01-06 DIAGNOSIS — G8929 Other chronic pain: Secondary | ICD-10-CM

## 2017-01-06 DIAGNOSIS — M545 Low back pain: Principal | ICD-10-CM

## 2017-01-06 MED ORDER — PREDNISONE 20 MG PO TABS: 20.0000 mg | ORAL_TABLET | Freq: Every day | ORAL | 0 refills | Status: DC

## 2017-01-06 NOTE — Telephone Encounter (Signed)
She has to avoid nsaids like ibuprofen and aleve.  We could do prednisone dose pack again, short course of a pain medication, and/or topical medications like capsaicin.

## 2017-01-06 NOTE — Telephone Encounter (Signed)
Spoke to daughter and wants to do the 12 days of 20mg  prednisone.

## 2017-01-06 NOTE — Telephone Encounter (Signed)
Patient's daughter calling stating patient is scheduled for injections in two weeks, April 5th, and wants to know if anything can be done for patient's pain during the two week period. Prednisone injections or pain medication.   Requesting a call back at [phone]

## 2017-01-06 NOTE — Telephone Encounter (Signed)
Spoke to daughter and asked if patient was doing tylenol or any topical creams. Patient has been but not helping. Wanted to know if she could do a low dose of prednisone until she gets her injections.

## 2017-01-06 NOTE — Telephone Encounter (Signed)
Spoke to daughter and told her that prednisone has been sent to [facility].

## 2017-01-06 NOTE — Telephone Encounter (Signed)
We could do the prednisone one of 3 different ways.  I could give what she had last time (6 day taper), a longer similar taper (12 days) or we could do 12 days of 20mg  prednisone.  I would not go longer than this though even if it doesn't bridge her to when she gets the shots.  If you take prednisone too long your body won't make its natural steroids.

## 2017-01-06 NOTE — Telephone Encounter (Signed)
[facility] [address].  Thanks!

## 2017-01-19 ENCOUNTER — Ambulatory Visit
Admission: RE | Admit: 2017-01-19 | Discharge: 2017-01-19 | Disposition: A | Discharge status: Home / Self Care | Payer: Medicare Other | Source: Ambulatory Visit | Attending: Family Medicine | Admitting: Family Medicine

## 2017-01-19 DIAGNOSIS — M545 Low back pain: Principal | ICD-10-CM

## 2017-01-19 DIAGNOSIS — G8929 Other chronic pain: Secondary | ICD-10-CM

## 2017-01-19 MED ORDER — IOPAMIDOL (ISOVUE-M 200) INJECTION 41%
1.0000 mL | Freq: Once | INTRAMUSCULAR | Status: AC
  Administered 2017-01-19: 1 mL via INTRA_ARTICULAR

## 2017-01-19 MED ORDER — METHYLPREDNISOLONE ACETATE 40 MG/ML INJ SUSP (RADIOLOG
120.0000 mg | Freq: Once | INTRAMUSCULAR | Status: AC
  Administered 2017-01-19: 120 mg via INTRA_ARTICULAR

## 2017-01-19 NOTE — Discharge Instructions (Signed)

## 2017-02-20 ENCOUNTER — Other Ambulatory Visit: Payer: Self-pay | Admitting: Family Medicine

## 2017-03-08 ENCOUNTER — Other Ambulatory Visit: Payer: Self-pay | Admitting: Family Medicine

## 2017-03-08 DIAGNOSIS — E876 Hypokalemia: Secondary | ICD-10-CM

## 2017-03-15 ENCOUNTER — Encounter: Payer: Self-pay | Admitting: Family Medicine

## 2017-03-15 ENCOUNTER — Ambulatory Visit (INDEPENDENT_AMBULATORY_CARE_PROVIDER_SITE_OTHER): Payer: Medicare Other | Admitting: Family Medicine

## 2017-03-15 DIAGNOSIS — M545 Low back pain, unspecified: Secondary | ICD-10-CM

## 2017-03-15 DIAGNOSIS — M79604 Pain in right leg: Secondary | ICD-10-CM

## 2017-03-15 NOTE — Patient Instructions (Signed)
Try tylenol regularly - 500mg  three times a day - for baseline pain relief. Consider physical therapy. Let Gunnar Fusiaula know if you want to repeat the injections you had in your back and we can set these up. Otherwise follow up with me in 1 month.

## 2017-03-16 ENCOUNTER — Other Ambulatory Visit: Payer: Self-pay | Admitting: Family Medicine

## 2017-03-16 DIAGNOSIS — E038 Other specified hypothyroidism: Secondary | ICD-10-CM

## 2017-03-16 DIAGNOSIS — G8929 Other chronic pain: Secondary | ICD-10-CM

## 2017-03-16 DIAGNOSIS — M545 Low back pain: Principal | ICD-10-CM

## 2017-03-16 NOTE — Progress Notes (Signed)
PCP and consultation requested by: [name], DO  Subjective:   HPI: Patient is a 83 y.o. female here for low back pain.  2/7: Patient reports she's had about 2 weeks of worsening midline low back pain radiating to right side into buttock and down to foot. No prior history of similar problem. Pain is 9/10, sharp. No numbness or tingling. Using heating pad. No injury or trauma. Difficulty sleeping, getting comfortable due to pain. No skin changes. No bowel/bladder dysfunction.  5/30: Patient returns for low back follow up. She is still getting pain in low back radiating down the right leg. Did well following prednisone. She also reports injections definitely helped her for 4 weeks. Associated numbness in right leg. Pain is sharp, worse with walking. Takes some over the counter medicine when really hurts and this does help some. No bowel/bladder dysfunction. No skin changes.  Past Medical History:  Diagnosis Date  . Arthritis   . Hypertension   . Thyroid disease     Current Outpatient Prescriptions on File Prior to Visit  Medication Sig Dispense Refill  . amLODipine (NORVASC) 2.5 MG tablet   0  . aspirin EC 325 MG EC tablet Take 325 mg by mouth daily.      . benzonatate (TESSALON) 100 MG capsule Take 1 capsule (100 mg total) by mouth 3 (three) times daily as needed for cough. 20 capsule 0  . Cyanocobalamin (VITAMIN B-12 PO) Take 1 tablet by mouth daily.     . fluticasone (FLONASE) 50 MCG/ACT nasal spray Place 2 sprays into both nostrils daily. 16 g 1  . hydrochlorothiazide (HYDRODIURIL) 25 MG tablet Take 1 tablet (25 mg total) by mouth daily. 90 tablet 3  . HYDROcodone-acetaminophen (NORCO) 5-325 MG tablet Take 1 tablet by mouth every 6 (six) hours as needed for moderate pain. 20 tablet 0  . levothyroxine (SYNTHROID, LEVOTHROID) 88 MCG tablet Take 1 tablet (88 mcg total) by mouth daily. 90 tablet 3  . NONFORMULARY OR COMPOUNDED ITEM Compression stockings 20-30  mm hg  #1  Dx edema 1 each 0  . potassium chloride SA (K-DUR,KLOR-CON) 20 MEQ tablet take 1 tablet by mouth once daily 30 tablet 5  . pravastatin (PRAVACHOL) 20 MG tablet take 1 tablet by mouth once daily 90 tablet 1  . predniSONE (DELTASONE) 20 MG tablet Take 1 tablet (20 mg total) by mouth daily with breakfast. 12 tablet 0  . solifenacin (VESICARE) 10 MG tablet Take 1 tablet (10 mg total) by mouth daily. 90 tablet 3  . vitamin E (VITAMIN E) 1000 UNIT capsule Take 2,000 Units by mouth daily.     No current facility-administered medications on file prior to visit.     Past Surgical History:  Procedure Laterality Date  . EYE SURGERY     lens implant    No Known Allergies  Social History   Social History  . Marital status: Widowed    Spouse name: N/A  . Number of children: N/A  . Years of education: N/A   Occupational History  . Not on file.   Social History Main Topics  . Smoking status: Never Smoker  . Smokeless tobacco: Never Used  . Alcohol use No  . Drug use: No  . Sexual activity: Not on file   Other Topics Concern  . 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 Coronary artery disease Unknown   . Diabetes Brother        DI  . Arthritis Mother   . Heart disease Mother   . Heart disease Father 40       MI    BP 126/72   Pulse 77   Ht 5\' 7"  (1.702 m)   Wt 143 lb (64.9 kg)   BMI 22.40 kg/m   Review of Systems: See HPI above.     Objective:  Physical Exam:  Gen: NAD, comfortable in exam room  Back: No gross deformity, scoliosis. TTP midline and just right of midline lumbar paraspinal muscles.  No bony tenderness however. Pain on extension > flexion. Strength LEs 5/5 all muscle groups. 2+ MSRs in patellar and achilles tendons, equal bilaterally. Negative bilateral SLRs. Sensation intact to light touch bilaterally. Negative  logroll bilateral hips   Assessment & Plan:  1. Low back pain - Facet arthropathy at L3-4 likely cause of her pain with edema and effusions.  Had improvement with injections but only for a few weeks.  Discussed options (repeat injections, physical therapy) - she would like to go ahead with repeat injections.  She will consider physical therapy.  To try tylenol regularly also.  F/u in 1 month.

## 2017-03-16 NOTE — Assessment & Plan Note (Signed)
Facet arthropathy at L3-4 likely cause of her pain with edema and effusions.  Had improvement with injections but only for a few weeks.  Discussed options (repeat injections, physical therapy) - she would like to go ahead with repeat injections.  She will consider physical therapy.  To try tylenol regularly also.  F/u in 1 month.

## 2017-03-23 ENCOUNTER — Ambulatory Visit
Admission: RE | Admit: 2017-03-23 | Discharge: 2017-03-23 | Disposition: A | Discharge status: Home / Self Care | Payer: Medicare Other | Source: Ambulatory Visit | Attending: Family Medicine | Admitting: Family Medicine

## 2017-03-23 DIAGNOSIS — M545 Low back pain: Principal | ICD-10-CM

## 2017-03-23 DIAGNOSIS — G8929 Other chronic pain: Secondary | ICD-10-CM

## 2017-03-23 MED ORDER — IOPAMIDOL (ISOVUE-M 200) INJECTION 41%
1.0000 mL | Freq: Once | INTRAMUSCULAR | Status: AC
  Administered 2017-03-23: 1 mL via INTRA_ARTICULAR

## 2017-03-23 MED ORDER — METHYLPREDNISOLONE ACETATE 40 MG/ML INJ SUSP (RADIOLOG
120.0000 mg | Freq: Once | INTRAMUSCULAR | Status: AC
  Administered 2017-03-23: 120 mg via INTRA_ARTICULAR

## 2017-03-23 NOTE — Discharge Instructions (Signed)

## 2017-04-05 ENCOUNTER — Other Ambulatory Visit: Payer: Self-pay | Admitting: Family Medicine

## 2017-04-05 DIAGNOSIS — E785 Hyperlipidemia, unspecified: Secondary | ICD-10-CM

## 2017-04-14 ENCOUNTER — Ambulatory Visit (INDEPENDENT_AMBULATORY_CARE_PROVIDER_SITE_OTHER): Payer: Medicare Other | Admitting: Family Medicine

## 2017-04-14 ENCOUNTER — Encounter: Payer: Self-pay | Admitting: Family Medicine

## 2017-04-14 DIAGNOSIS — M545 Low back pain, unspecified: Secondary | ICD-10-CM

## 2017-04-14 NOTE — Patient Instructions (Signed)
Tylenol regularly - 500mg  three times a day - for baseline pain relief. Consider physical therapy - let me know if you want us to order this. Other options we discussed include a nerve-blocking medicine (nortriptyline or gabapentin), repeating injections, neurosurgery referral. Follow up with me as needed if you're doing well.

## 2017-04-15 NOTE — Progress Notes (Signed)
PCP and consultation requested by: [name], DO  Subjective:   HPI: Patient is a 83 y.o. female here for low back pain.  2/7: Patient reports she's had about 2 weeks of worsening midline low back pain radiating to right side into buttock and down to foot. No prior history of similar problem. Pain is 9/10, sharp. No numbness or tingling. Using heating pad. No injury or trauma. Difficulty sleeping, getting comfortable due to pain. No skin changes. No bowel/bladder dysfunction.  5/30: Patient returns for low back follow up. She is still getting pain in low back radiating down the right leg. Did well following prednisone. She also reports injections definitely helped her for 4 weeks. Associated numbness in right leg. Pain is sharp, worse with walking. Takes some over the counter medicine when really hurts and this does help some. No bowel/bladder dysfunction. No skin changes.  6/29: Patient reports she feels about the same as last visit. Doesn't feel repeat injection helped her this time. Taking tylenol regularly. Pain still radiates into her right foot with numbness, leg feels weak. Pain level 0/10 but up to 6/10 and sharp. No bowel/bladder dysfunction. No skin changes.  Past Medical History:  Diagnosis Date  . Arthritis   . Hypertension   . Thyroid disease     Current Outpatient Prescriptions on File Prior to Visit  Medication Sig Dispense Refill  . amLODipine (NORVASC) 2.5 MG tablet   0  . aspirin EC 325 MG EC tablet Take 325 mg by mouth daily.      . benzonatate (TESSALON) 100 MG capsule Take 1 capsule (100 mg total) by mouth 3 (three) times daily as needed for cough. 20 capsule 0  . Cyanocobalamin (VITAMIN B-12 PO) Take 1 tablet by mouth daily.     . fluticasone (FLONASE) 50 MCG/ACT nasal spray Place 2 sprays into both nostrils daily. 16 g 1  . hydrochlorothiazide (HYDRODIURIL) 25 MG tablet Take 1 tablet (25 mg total) by mouth daily. 90 tablet 3  .  HYDROcodone-acetaminophen (NORCO) 5-325 MG tablet Take 1 tablet by mouth every 6 (six) hours as needed for moderate pain. 20 tablet 0  . levothyroxine (SYNTHROID, LEVOTHROID) 88 MCG tablet take 1 tablet by mouth once daily 90 tablet 0  . NONFORMULARY OR COMPOUNDED ITEM Compression stockings 20-30 mm hg  #1  Dx edema 1 each 0  . potassium chloride SA (K-DUR,KLOR-CON) 20 MEQ tablet take 1 tablet by mouth once daily 30 tablet 5  . pravastatin (PRAVACHOL) 20 MG tablet take 1 tablet by mouth once daily 90 tablet 0  . predniSONE (DELTASONE) 20 MG tablet Take 1 tablet (20 mg total) by mouth daily with breakfast. 12 tablet 0  . solifenacin (VESICARE) 10 MG tablet Take 1 tablet (10 mg total) by mouth daily. 90 tablet 3  . vitamin E (VITAMIN E) 1000 UNIT capsule Take 2,000 Units by mouth daily.     No current facility-administered medications on file prior to visit.     Past Surgical History:  Procedure Laterality Date  . EYE SURGERY     lens implant    No Known Allergies  Social History   Social History  . Marital status: Widowed    Spouse name: N/A  . Number of children: N/A  . Years of education: N/A   Occupational History  . Not on file.   Social History Main Topics  . Smoking status: Never Smoker  . Smokeless tobacco: Never Used  . Alcohol use No  .  Drug use: No  . Sexual activity: Not on file   Other Topics Concern  . 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 Coronary artery disease Unknown   . Diabetes Brother        DI  . Arthritis Mother   . Heart disease Mother   . Heart disease Father 40       MI    BP (!) 144/75   Pulse 76   Ht 5\' 7"  (1.702 m)   Wt 145 lb (65.8 kg)   BMI 22.71 kg/m   Review of Systems: See HPI above.     Objective:  Physical Exam:  Gen: NAD, comfortable in exam room  Back: No gross deformity, scoliosis. TTP  right of midline lumbar paraspinal muscles.  No bony tenderness however. Pain on extension mildly. Strength LEs 5/5 all muscle groups. 2+ MSRs in patellar and achilles tendons, equal bilaterally. Negative bilateral SLRs. Sensation intact to light touch bilaterally. Negative logroll bilateral hips   Assessment & Plan:  1. Low back pain - Facet arthropathy at L3-4 likely cause of her pain with edema and effusions.  First injections helped but not second.  She declined other treatment at this time.  Recommended physical therapy and to consider this.  Tylenol regularly, consider nortriptyline or gabapentin.  F/u prn.

## 2017-04-15 NOTE — Assessment & Plan Note (Signed)
Facet arthropathy at L3-4 likely cause of her pain with edema and effusions.  First injections helped but not second.  She declined other treatment at this time.  Recommended physical therapy and to consider this.  Tylenol regularly, consider nortriptyline or gabapentin.  F/u prn.

## 2017-04-26 ENCOUNTER — Other Ambulatory Visit: Payer: Self-pay | Admitting: Family Medicine

## 2017-04-26 DIAGNOSIS — I10 Essential (primary) hypertension: Secondary | ICD-10-CM

## 2017-06-14 ENCOUNTER — Other Ambulatory Visit: Payer: Self-pay | Admitting: Family Medicine

## 2017-06-14 DIAGNOSIS — E038 Other specified hypothyroidism: Secondary | ICD-10-CM

## 2017-06-14 NOTE — Telephone Encounter (Signed)
Patient needs an appt/has been over 1 year/thx dmf

## 2017-06-15 ENCOUNTER — Telehealth: Payer: Self-pay | Admitting: Family Medicine

## 2017-06-15 DIAGNOSIS — E038 Other specified hypothyroidism: Secondary | ICD-10-CM

## 2017-06-15 NOTE — Telephone Encounter (Signed)
Patient needs appt/denied/thx dmf

## 2017-06-16 MED ORDER — LEVOTHYROXINE SODIUM 88 MCG PO TABS: 88.0000 ug | ORAL_TABLET | Freq: Every day | ORAL | 0 refills | Status: DC

## 2017-06-16 NOTE — Telephone Encounter (Signed)
Faxed #30 Levothyroxine as pt is now scheduled/she is aware/thx dmf

## 2017-06-16 NOTE — Telephone Encounter (Signed)
Pt's apt has been scheduled. Pt has 2 pills left, she would like to know if provider could supply her with a few until pt's apt on Tuesday?   Pharmacy: [id number] [place] - [phone]

## 2017-06-20 ENCOUNTER — Encounter: Payer: Self-pay | Admitting: Family Medicine

## 2017-06-20 ENCOUNTER — Ambulatory Visit (INDEPENDENT_AMBULATORY_CARE_PROVIDER_SITE_OTHER): Payer: Medicare Other | Admitting: Family Medicine

## 2017-06-20 VITALS — BP 128/70 | HR 75 | Temp 98.0°F | Ht 67.0 in | Wt 146.0 lb

## 2017-06-20 DIAGNOSIS — E038 Other specified hypothyroidism: Secondary | ICD-10-CM

## 2017-06-20 DIAGNOSIS — R32 Unspecified urinary incontinence: Secondary | ICD-10-CM | POA: Diagnosis not present

## 2017-06-20 DIAGNOSIS — Z23 Encounter for immunization: Secondary | ICD-10-CM | POA: Diagnosis not present

## 2017-06-20 DIAGNOSIS — I10 Essential (primary) hypertension: Secondary | ICD-10-CM

## 2017-06-20 DIAGNOSIS — E876 Hypokalemia: Secondary | ICD-10-CM | POA: Diagnosis not present

## 2017-06-20 DIAGNOSIS — E039 Hypothyroidism, unspecified: Secondary | ICD-10-CM | POA: Diagnosis not present

## 2017-06-20 DIAGNOSIS — E785 Hyperlipidemia, unspecified: Secondary | ICD-10-CM

## 2017-06-20 LAB — COMPREHENSIVE METABOLIC PANEL
ALBUMIN: 4.2 g/dL (ref 3.5–5.2)
ALT: 12 U/L (ref 0–35)
AST: 18 U/L (ref 0–37)
Alkaline Phosphatase: 86 U/L (ref 39–117)
BUN: 21 mg/dL (ref 6–23)
CHLORIDE: 101 meq/L (ref 96–112)
CO2: 30 meq/L (ref 19–32)
Calcium: 9.7 mg/dL (ref 8.4–10.5)
Creatinine, Ser: 1.5 mg/dL — ABNORMAL HIGH (ref 0.40–1.20)
GFR: 35.24 mL/min — ABNORMAL LOW (ref >60.00)
GLUCOSE: 85 mg/dL (ref 70–99)
POTASSIUM: 3.2 meq/L — AB (ref 3.5–5.1)
SODIUM: 141 meq/L (ref 135–145)
TOTAL PROTEIN: 6.7 g/dL (ref 6.0–8.3)
Total Bilirubin: 0.6 mg/dL (ref 0.2–1.2)

## 2017-06-20 LAB — LIPID PANEL
CHOL/HDL RATIO: 4
Cholesterol: 155 mg/dL (ref 0–200)
HDL: 39.5 mg/dL (ref >39.00)
LDL CALC: 91 mg/dL (ref 0–99)
NONHDL: 115.49
TRIGLYCERIDES: 123 mg/dL (ref 0.0–149.0)
VLDL: 24.6 mg/dL (ref 0.0–40.0)

## 2017-06-20 LAB — TSH: TSH: 0.37 u[IU]/mL (ref 0.35–4.50)

## 2017-06-20 MED ORDER — LEVOTHYROXINE SODIUM 88 MCG PO TABS: 88.0000 ug | ORAL_TABLET | Freq: Every day | ORAL | 3 refills | Status: DC

## 2017-06-20 MED ORDER — HYDROCHLOROTHIAZIDE 25 MG PO TABS: 25.0000 mg | ORAL_TABLET | Freq: Every day | ORAL | 3 refills | Status: DC

## 2017-06-20 MED ORDER — PRAVASTATIN SODIUM 20 MG PO TABS: 20.0000 mg | ORAL_TABLET | Freq: Every day | ORAL | 1 refills | Status: DC

## 2017-06-20 MED ORDER — POTASSIUM CHLORIDE CRYS ER 20 MEQ PO TBCR: 20.0000 meq | EXTENDED_RELEASE_TABLET | Freq: Every day | ORAL | 3 refills | Status: DC

## 2017-06-20 MED ORDER — SOLIFENACIN SUCCINATE 10 MG PO TABS: 10.0000 mg | ORAL_TABLET | Freq: Every day | ORAL | 3 refills | Status: DC

## 2017-06-20 MED ORDER — AMLODIPINE BESYLATE 2.5 MG PO TABS: ORAL_TABLET | ORAL | 1 refills | Status: DC

## 2017-06-20 NOTE — Patient Instructions (Signed)

## 2017-06-20 NOTE — Assessment & Plan Note (Signed)
Tolerating statin, encouraged heart healthy diet, avoid trans fats, minimize simple carbs and saturated fats. Increase exercise as tolerated 

## 2017-06-20 NOTE — Assessment & Plan Note (Signed)
con't synthroid Check TSH  

## 2017-06-20 NOTE — Progress Notes (Signed)
Patient ID: Belinda Anderson, female    DOB: August 14, 1932  Age: 83 y.o. MRN: [id number]    Subjective:  Subjective  HPI Belinda Anderson presents for f/u cholesterol, bp and thyroid.  No complaints.    Review of Systems  Constitutional: Negative for appetite change, chills, diaphoresis, fatigue, fever and unexpected weight change.  HENT: Negative for congestion and hearing loss.   Eyes: Negative for pain, discharge, redness and visual disturbance.  Respiratory: Negative for cough, chest tightness, shortness of breath and wheezing.   Cardiovascular: Negative for chest pain, palpitations and leg swelling.  Gastrointestinal: Negative for abdominal pain, blood in stool, constipation, diarrhea, nausea and vomiting.  Endocrine: Negative for cold intolerance, heat intolerance, polydipsia, polyphagia and polyuria.  Genitourinary: Negative for difficulty urinating, dysuria, frequency, hematuria and urgency.  Musculoskeletal: Negative for back pain and myalgias.  Skin: Negative for rash.  Allergic/Immunologic: Negative for environmental allergies.  Neurological: Negative for dizziness, weakness, light-headedness, numbness and headaches.  Hematological: Does not bruise/bleed easily.  Psychiatric/Behavioral: Negative for suicidal ideas. The patient is not nervous/anxious.     History Past Medical History:  Diagnosis Date  . Arthritis   . Hypertension   . Thyroid disease     She has a past surgical history that includes Eye surgery.   Her family history includes Arthritis in her mother; Coronary artery disease in her unknown relative; Diabetes in her brother; Heart disease in her mother; Heart disease (age of onset: 85) in her father.She reports that she has never smoked. She has never used smokeless tobacco. She reports that she does not drink alcohol or use drugs.  Current Outpatient Prescriptions on File Prior to Visit  Medication Sig Dispense Refill  . aspirin EC 325 MG EC tablet Take 325 mg  by mouth daily.      . benzonatate (TESSALON) 100 MG capsule Take 1 capsule (100 mg total) by mouth 3 (three) times daily as needed for cough. 20 capsule 0  . Cyanocobalamin (VITAMIN B-12 PO) Take 1 tablet by mouth daily.     . fluticasone (FLONASE) 50 MCG/ACT nasal spray Place 2 sprays into both nostrils daily. 16 g 1  . HYDROcodone-acetaminophen (NORCO) 5-325 MG tablet Take 1 tablet by mouth every 6 (six) hours as needed for moderate pain. 20 tablet 0  . NONFORMULARY OR COMPOUNDED ITEM Compression stockings 20-30 mm hg  #1  Dx edema 1 each 0  . vitamin E (VITAMIN E) 1000 UNIT capsule Take 2,000 Units by mouth daily.     No current facility-administered medications on file prior to visit.      Objective:  Objective  Physical Exam  Constitutional: She is oriented to person, place, and time. She appears well-developed and well-nourished.  HENT:  Head: Normocephalic and atraumatic.  Eyes: Conjunctivae and EOM are normal.  Neck: Normal range of motion. Neck supple. No JVD present. Carotid bruit is not present. No thyromegaly present.  Cardiovascular: Normal rate, regular rhythm and normal heart sounds.   No murmur heard. Pulmonary/Chest: Effort normal and breath sounds normal. No respiratory distress. She has no wheezes. She has no rales. She exhibits no tenderness.  Musculoskeletal: She exhibits edema.       Right ankle: She exhibits swelling.       Feet:  Neurological: She is alert and oriented to person, place, and time.  Psychiatric: She has a normal mood and affect.  Nursing note and vitals reviewed.  BP 128/70 (BP Location: Right Arm, Patient Position: Sitting, Cuff Size: Normal)  Pulse 75   Temp 98 F (36.7 C) (Oral)   Ht 5\' 7"  (1.702 m)   Wt 146 lb (66.2 kg)   SpO2 98%   BMI 22.87 kg/m  Wt Readings from Last 3 Encounters:  06/20/17 146 lb (66.2 kg)  04/14/17 145 lb (65.8 kg)  03/15/17 143 lb (64.9 kg)     Lab Results  Component Value Date   WBC 5.5 09/04/2014    HGB 12.6 09/04/2014   HCT 37.8 09/04/2014   PLT 258.0 09/04/2014   GLUCOSE 94 04/01/2016   CHOL 206 (H) 03/15/2016   TRIG 166.0 (H) 03/15/2016   HDL 40.40 03/15/2016   LDLCALC 133 (H) 03/15/2016   ALT 11 03/15/2016   AST 19 03/15/2016   NA 142 04/01/2016   K 3.3 (L) 04/01/2016   CL 101 04/01/2016   CREATININE 1.48 (H) 04/01/2016   BUN 25 (H) 04/01/2016   CO2 33 (H) 04/01/2016   TSH 1.18 03/15/2016   INR 1.03 07/10/2013    Dg Facet Jt Inj L /s Single Level Right W/fl/ct  Result Date: 03/23/2017 CLINICAL DATA:  Improved falling the previous injection. No left-sided pain presently. Right leg pain. L3-4 facet injections requested. EXAM: Right L3-4 facet injection PROCEDURE: The procedure, risks, benefits, and alternatives were explained to the patient. Questions regarding the procedure were encouraged and answered. The patient understands and consents to the procedure. Right L3-4 FACET INJECTION: A posterior oblique approach was taken to the facet on the right at L3-4 using a curved 22 gauge spinal needle. Intra-articular positioning was confirmed by injecting a small amount of Isovue-M 200. No vascular opacification is seen. Eighty mg of Depo-Medrol mixed with 0.5 cc 1% lidocaine were instilled into the joint. The injection resulted in concordant pain. The procedure was well-tolerated. IMPRESSION: Technically successful right L3-4 facet injection. I wonder if the patient's symptoms may be due to spinal stenosis. Consideration of that possibility suggested. The patient has pronounced asymmetric swelling of the right lower extremity. No redness. I suggested that she seek evaluation with her primary medical doctor. Electronically Signed   By: Paulina FusiMark  Shogry M.D.   On: 03/23/2017 11:33     Assessment & Plan:  Plan  I have discontinued Belinda Anderson's predniSONE. I have also changed her pravastatin, potassium chloride SA, hydrochlorothiazide, and amLODipine. Additionally, I am having her maintain her  aspirin EC, Cyanocobalamin (VITAMIN B-12 PO), vitamin E, NONFORMULARY OR COMPOUNDED ITEM, fluticasone, benzonatate, HYDROcodone-acetaminophen, Acetaminophen (TYLENOL ARTHRITIS PAIN PO), solifenacin, and levothyroxine.  Meds ordered this encounter  Medications  . Acetaminophen (TYLENOL ARTHRITIS PAIN PO)    Sig: Take by mouth.  . solifenacin (VESICARE) 10 MG tablet    Sig: Take 1 tablet (10 mg total) by mouth daily.    Dispense:  90 tablet    Refill:  3  . pravastatin (PRAVACHOL) 20 MG tablet    Sig: Take 1 tablet (20 mg total) by mouth daily.    Dispense:  90 tablet    Refill:  1  . potassium chloride SA (K-DUR,KLOR-CON) 20 MEQ tablet    Sig: Take 1 tablet (20 mEq total) by mouth daily.    Dispense:  90 tablet    Refill:  3  . levothyroxine (SYNTHROID, LEVOTHROID) 88 MCG tablet    Sig: Take 1 tablet (88 mcg total) by mouth daily.    Dispense:  90 tablet    Refill:  3  . hydrochlorothiazide (HYDRODIURIL) 25 MG tablet    Sig: Take 1 tablet (25 mg  total) by mouth daily.    Dispense:  90 tablet    Refill:  3  . amLODipine (NORVASC) 2.5 MG tablet    Sig: 1po qd    Dispense:  90 tablet    Refill:  1    Problem List Items Addressed This Visit      Unprioritized   Essential hypertension - Primary    Well controlled, no changes to meds. Encouraged heart healthy diet such as the DASH diet and exercise as tolerated.       Relevant Medications   pravastatin (PRAVACHOL) 20 MG tablet   hydrochlorothiazide (HYDRODIURIL) 25 MG tablet   amLODipine (NORVASC) 2.5 MG tablet   Other Relevant Orders   Comprehensive metabolic panel   Hyperlipidemia    Tolerating statin, encouraged heart healthy diet, avoid trans fats, minimize simple carbs and saturated fats. Increase exercise as tolerated      Relevant Medications   pravastatin (PRAVACHOL) 20 MG tablet   hydrochlorothiazide (HYDRODIURIL) 25 MG tablet   amLODipine (NORVASC) 2.5 MG tablet   Hypothyroidism    con't synthroid  Check  TSH      Relevant Medications   levothyroxine (SYNTHROID, LEVOTHROID) 88 MCG tablet   Other Relevant Orders   TSH    Other Visit Diagnoses    Hyperlipidemia LDL goal <100       Relevant Medications   pravastatin (PRAVACHOL) 20 MG tablet   hydrochlorothiazide (HYDRODIURIL) 25 MG tablet   amLODipine (NORVASC) 2.5 MG tablet   Other Relevant Orders   Comprehensive metabolic panel   Lipid panel   Urinary incontinence, unspecified type       Relevant Medications   solifenacin (VESICARE) 10 MG tablet   Low serum potassium level       Relevant Medications   potassium chloride SA (K-DUR,KLOR-CON) 20 MEQ tablet      Follow-up: Return in about 6 months (around 12/18/2017) for annual exam, fasting.  [name], DO

## 2017-06-20 NOTE — Assessment & Plan Note (Signed)
Well controlled, no changes to meds. Encouraged heart healthy diet such as the DASH diet and exercise as tolerated.  °

## 2017-07-20 ENCOUNTER — Other Ambulatory Visit: Payer: Self-pay | Admitting: Family Medicine

## 2017-07-20 DIAGNOSIS — E038 Other specified hypothyroidism: Secondary | ICD-10-CM

## 2017-07-21 NOTE — Telephone Encounter (Signed)
On 9.4.18 #[phone] was faxed/thx dmf

## 2017-08-16 ENCOUNTER — Telehealth: Payer: Self-pay | Admitting: Family Medicine

## 2017-08-16 ENCOUNTER — Telehealth: Payer: Self-pay

## 2017-08-16 NOTE — Telephone Encounter (Signed)
This request has been approved. [employer] will send a letter to the member and you regarding this decision. Please see additional information at the bottom of this request. Effective 08/16/2017 through 08/16/2018.

## 2017-08-16 NOTE — Telephone Encounter (Signed)
PA initiated via Covermymeds; KEY: [id number]. Awaiting determination.

## 2017-08-16 NOTE — Telephone Encounter (Signed)
[employer] gave verbal apporval for 1 year supply of Vesicare call back number [phone]. They are also sending fax documentation. FYI

## 2017-09-18 ENCOUNTER — Ambulatory Visit (INDEPENDENT_AMBULATORY_CARE_PROVIDER_SITE_OTHER): Payer: Medicare Other | Admitting: Family Medicine

## 2017-09-18 ENCOUNTER — Encounter: Payer: Self-pay | Admitting: Family Medicine

## 2017-09-18 VITALS — BP 138/70 | HR 71 | Temp 97.4°F | Ht 64.6 in | Wt 150.0 lb

## 2017-09-18 DIAGNOSIS — E039 Hypothyroidism, unspecified: Secondary | ICD-10-CM | POA: Diagnosis not present

## 2017-09-18 DIAGNOSIS — Z0000 Encounter for general adult medical examination without abnormal findings: Secondary | ICD-10-CM | POA: Insufficient documentation

## 2017-09-18 DIAGNOSIS — I10 Essential (primary) hypertension: Secondary | ICD-10-CM | POA: Diagnosis not present

## 2017-09-18 DIAGNOSIS — Z0001 Encounter for general adult medical examination with abnormal findings: Secondary | ICD-10-CM | POA: Diagnosis not present

## 2017-09-18 DIAGNOSIS — E538 Deficiency of other specified B group vitamins: Secondary | ICD-10-CM

## 2017-09-18 DIAGNOSIS — E785 Hyperlipidemia, unspecified: Secondary | ICD-10-CM

## 2017-09-18 DIAGNOSIS — R197 Diarrhea, unspecified: Secondary | ICD-10-CM

## 2017-09-18 DIAGNOSIS — R413 Other amnesia: Secondary | ICD-10-CM

## 2017-09-18 LAB — CBC WITH DIFFERENTIAL/PLATELET
BASOS PCT: 0.6 % (ref 0.0–3.0)
Basophils Absolute: 0 10*3/uL (ref 0.0–0.1)
EOS ABS: 0.1 10*3/uL (ref 0.0–0.7)
Eosinophils Relative: 1.4 % (ref 0.0–5.0)
HCT: 41 % (ref 36.0–46.0)
HEMOGLOBIN: 13.4 g/dL (ref 12.0–15.0)
LYMPHS ABS: 1.4 10*3/uL (ref 0.7–4.0)
Lymphocytes Relative: 23.9 % (ref 12.0–46.0)
MCHC: 32.6 g/dL (ref 30.0–36.0)
MCV: 98.9 fl (ref 78.0–100.0)
MONO ABS: 0.7 10*3/uL (ref 0.1–1.0)
Monocytes Relative: 12.1 % — ABNORMAL HIGH (ref 3.0–12.0)
NEUTROS PCT: 62 % (ref 43.0–77.0)
Neutro Abs: 3.7 10*3/uL (ref 1.4–7.7)
Platelets: 239 10*3/uL (ref 150.0–400.0)
RBC: 4.15 Mil/uL (ref 3.87–5.11)
RDW: 13.4 % (ref 11.5–15.5)
WBC: 5.9 10*3/uL (ref 4.0–10.5)

## 2017-09-18 LAB — TSH: TSH: 1.17 u[IU]/mL (ref 0.35–4.50)

## 2017-09-18 LAB — VITAMIN B12: Vitamin B-12: 261 pg/mL (ref 211–911)

## 2017-09-18 NOTE — Assessment & Plan Note (Signed)
Per neuro 

## 2017-09-18 NOTE — Assessment & Plan Note (Signed)
Check labs 

## 2017-09-18 NOTE — Assessment & Plan Note (Signed)
Tolerating statin, encouraged heart healthy diet, avoid trans fats, minimize simple carbs and saturated fats. Increase exercise as tolerated 

## 2017-09-18 NOTE — Progress Notes (Signed)
Subjective:  I acted as a Neurosurgeonscribe for Lubrizol CorporationDr.Lowne-Chase  Toya, CMA   Patient ID: Belinda Anderson, female    DOB: August 09, 1932, 83 y.o.   MRN: [id number]  Chief Complaint  Patient presents with  . Annual Exam    HPI  Patient is in today for Annual Exam    Daughter is with her.  She says the holidays are always bad and after theholidays it gets better.     Patient Care Team: Zola ButtonLowne Chase, Grayling CongressYvonne R, DO as PCP - General   Past Medical History:  Diagnosis Date  . Arthritis   . Hypertension   . Thyroid disease     Past Surgical History:  Procedure Laterality Date  . EYE SURGERY     lens implant    Family History  Problem Relation Age of Onset  . Arthritis Mother   . Heart disease Mother   . Heart disease Father [id number]       MI  . Coronary artery disease Unknown   . Diabetes Brother        DI    Social History   Socioeconomic History  . Marital status: Widowed    Spouse name: Not on file  . Number of children: Not on file  . Years of education: Not on file  . Highest education level: Not on file  Social Needs  . Financial resource strain: Not on file  . Food insecurity - worry: Not on file  . Food insecurity - inability: Not on file  . Transportation needs - medical: Not on file  . Transportation needs - non-medical: Not on file  Occupational History  . Not on file  Tobacco Use  . Smoking status: Never Smoker  . Smokeless tobacco: Never Used  Substance and Sexual Activity  . Alcohol use: No  . Drug use: No  . Sexual activity: Not on file  Other Topics Concern  . Not on file  Social History Narrative   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mowing lawn, cleaning gutters    Outpatient Medications Prior to Visit  Medication Sig Dispense Refill  . Acetaminophen (TYLENOL ARTHRITIS PAIN PO) Take by mouth.    Marland Kitchen. amLODipine (NORVASC) 2.5 MG tablet 1po qd 90 tablet 1  . aspirin EC 325 MG EC tablet Take 325 mg by mouth daily.      . benzonatate (TESSALON) 100 MG capsule  Take 1 capsule (100 mg total) by mouth 3 (three) times daily as needed for cough. 20 capsule 0  . Cyanocobalamin (VITAMIN B-12 PO) Take 1 tablet by mouth daily.     . fluticasone (FLONASE) 50 MCG/ACT nasal spray Place 2 sprays into both nostrils daily. 16 g 1  . hydrochlorothiazide (HYDRODIURIL) 25 MG tablet Take 1 tablet (25 mg total) by mouth daily. 90 tablet 3  . levothyroxine (SYNTHROID, LEVOTHROID) 88 MCG tablet Take 1 tablet (88 mcg total) by mouth daily. 90 tablet 3  . NONFORMULARY OR COMPOUNDED ITEM Compression stockings 20-30 mm hg  #1  Dx edema 1 each 0  . potassium chloride SA (K-DUR,KLOR-CON) 20 MEQ tablet Take 1 tablet (20 mEq total) by mouth daily. 90 tablet 3  . vitamin E (VITAMIN E) 1000 UNIT capsule Take 2,000 Units by mouth daily.    Marland Kitchen. HYDROcodone-acetaminophen (NORCO) 5-325 MG tablet Take 1 tablet by mouth every 6 (six) hours as needed for moderate pain. 20 tablet 0  . pravastatin (PRAVACHOL) 20 MG tablet Take 1 tablet (20 mg total) by mouth daily.  90 tablet 1  . solifenacin (VESICARE) 10 MG tablet Take 1 tablet (10 mg total) by mouth daily. 90 tablet 3   No facility-administered medications prior to visit.     No Known Allergies  Review of Systems  Constitutional: Negative for chills, fever and malaise/fatigue.  HENT: Negative for congestion and hearing loss.   Eyes: Negative for discharge.  Respiratory: Negative for cough, sputum production and shortness of breath.   Cardiovascular: Negative for chest pain, palpitations and leg swelling.  Gastrointestinal: Negative for abdominal pain, blood in stool, constipation, diarrhea, heartburn, nausea and vomiting.  Genitourinary: Negative for dysuria, frequency, hematuria and urgency.  Musculoskeletal: Negative for back pain, falls and myalgias.  Skin: Negative for rash.  Neurological: Negative for dizziness, sensory change, loss of consciousness, weakness and headaches.  Endo/Heme/Allergies: Negative for environmental  allergies. Does not bruise/bleed easily.  Psychiatric/Behavioral: Positive for memory loss. Negative for depression and suicidal ideas. The patient is not nervous/anxious and does not have insomnia.        Objective:    Physical Exam  Constitutional: She is oriented to person, place, and time. She appears well-developed and well-nourished. No distress.  HENT:  Head: Normocephalic and atraumatic.  Right Ear: External ear normal.  Left Ear: External ear normal.  Nose: Nose normal.  Mouth/Throat: Oropharynx is clear and moist.  Eyes: Conjunctivae and EOM are normal. Pupils are equal, round, and reactive to light. Right eye exhibits no discharge. Left eye exhibits no discharge.  Neck: Normal range of motion. Neck supple. No JVD present. Carotid bruit is not present. No thyromegaly present.  Cardiovascular: Normal rate, regular rhythm, normal heart sounds and intact distal pulses.  No murmur heard. Pulmonary/Chest: Effort normal and breath sounds normal. No respiratory distress. She has no wheezes. She has no rales. She exhibits no tenderness.  Abdominal: Soft. Bowel sounds are normal. She exhibits no distension and no mass. There is no tenderness. There is no rebound and no guarding.  Genitourinary:  Genitourinary Comments: Pelvic and breast --- pt refused  Musculoskeletal: Normal range of motion. She exhibits no edema or tenderness.  Lymphadenopathy:    She has no cervical adenopathy.  Neurological: She is alert and oriented to person, place, and time. She has normal reflexes. No cranial nerve deficit.  Skin: Skin is warm and dry. No rash noted. She is not diaphoretic. No erythema.  Psychiatric: She has a normal mood and affect. Her behavior is normal. Judgment and thought content normal.  Nursing note and vitals reviewed.   BP 138/70   Pulse 71   Temp (!) 97.4 F (36.3 C) (Oral)   Ht 5' 4.6" (1.641 m)   Wt 150 lb (68 kg)   SpO2 95%   BMI 25.27 kg/m  Wt Readings from Last 3  Encounters:  09/18/17 150 lb (68 kg)  06/20/17 146 lb (66.2 kg)  04/14/17 145 lb (65.8 kg)   BP Readings from Last 3 Encounters:  09/18/17 138/70  06/20/17 128/70  04/14/17 (!) 144/75     Immunization History  Administered Date(s) Administered  . Influenza Whole 08/06/2008, 07/20/2009, 07/06/2010  . Influenza, High Dose Seasonal PF 06/20/2017  . Pneumococcal Conjugate-13 03/15/2016  . Pneumococcal Polysaccharide-23 07/31/2000    Health Maintenance  Topic Date Due  . TETANUS/TDAP  05/12/1953  . DEXA SCAN  05/13/1999  . MAMMOGRAM  03/28/2013  . INFLUENZA VACCINE  Completed  . PNA vac Low Risk Adult  Completed    Lab Results  Component Value Date  WBC 5.5 09/04/2014   HGB 12.6 09/04/2014   HCT 37.8 09/04/2014   PLT 258.0 09/04/2014   GLUCOSE 85 06/20/2017   CHOL 155 06/20/2017   TRIG 123.0 06/20/2017   HDL 39.50 06/20/2017   LDLCALC 91 06/20/2017   ALT 12 06/20/2017   AST 18 06/20/2017   NA 141 06/20/2017   K 3.2 (L) 06/20/2017   CL 101 06/20/2017   CREATININE 1.50 (H) 06/20/2017   BUN 21 06/20/2017   CO2 30 06/20/2017   TSH 0.37 06/20/2017   INR 1.03 07/10/2013    Lab Results  Component Value Date   TSH 0.37 06/20/2017   Lab Results  Component Value Date   WBC 5.5 09/04/2014   HGB 12.6 09/04/2014   HCT 37.8 09/04/2014   MCV 93.9 09/04/2014   PLT 258.0 09/04/2014   Lab Results  Component Value Date   NA 141 06/20/2017   K 3.2 (L) 06/20/2017   CO2 30 06/20/2017   GLUCOSE 85 06/20/2017   BUN 21 06/20/2017   CREATININE 1.50 (H) 06/20/2017   BILITOT 0.6 06/20/2017   ALKPHOS 86 06/20/2017   AST 18 06/20/2017   ALT 12 06/20/2017   PROT 6.7 06/20/2017   ALBUMIN 4.2 06/20/2017   CALCIUM 9.7 06/20/2017   GFR 35.24 (L) 06/20/2017   Lab Results  Component Value Date   CHOL 155 06/20/2017   Lab Results  Component Value Date   HDL 39.50 06/20/2017   Lab Results  Component Value Date   LDLCALC 91 06/20/2017   Lab Results  Component Value  Date   TRIG 123.0 06/20/2017   Lab Results  Component Value Date   CHOLHDL 4 06/20/2017   No results found for: HGBA1C       Assessment & Plan:   Problem List Items Addressed This Visit      Unprioritized   DIARRHEA   Relevant Orders   TSH   Essential hypertension    Well controlled, no changes to meds. Encouraged heart healthy diet such as the DASH diet and exercise as tolerated.       Relevant Orders   Vitamin B12   Lipid panel   CBC with Differential/Platelet   Comprehensive metabolic panel   TSH   Hyperlipidemia    Tolerating statin, encouraged heart healthy diet, avoid trans fats, minimize simple carbs and saturated fats. Increase exercise as tolerated      Hypothyroidism - Primary    Check labs      Relevant Orders   TSH   MEMORY LOSS    Per neuro      Preventative health care    ghm utd Check labs Pt refuses bmd and mammogram         Other Visit Diagnoses    Hyperlipidemia LDL goal <100       Relevant Orders   Lipid panel   B12 deficiency       Relevant Orders   Vitamin B12      I have discontinued Lanora ManisElizabeth Muradyan's HYDROcodone-acetaminophen, solifenacin, and pravastatin. I am also having her maintain her aspirin EC, Cyanocobalamin (VITAMIN B-12 PO), vitamin E, NONFORMULARY OR COMPOUNDED ITEM, fluticasone, benzonatate, Acetaminophen (TYLENOL ARTHRITIS PAIN PO), potassium chloride SA, levothyroxine, hydrochlorothiazide, and amLODipine.  No orders of the defined types were placed in this encounter.   CMA served as Neurosurgeonscribe during this visit. History, Physical and Plan performed by medical provider. Documentation and orders reviewed and attested to.  Donato SchultzYvonne R Lowne Chase, DO

## 2017-09-18 NOTE — Assessment & Plan Note (Signed)
Well controlled, no changes to meds. Encouraged heart healthy diet such as the DASH diet and exercise as tolerated.  °

## 2017-09-18 NOTE — Assessment & Plan Note (Signed)
ghm utd Check labs Pt refuses bmd and mammogram

## 2017-09-18 NOTE — Patient Instructions (Signed)
Preventive Care 81 Years and Older, Female Preventive care refers to lifestyle choices and visits with your health care provider that can promote health and wellness. What does preventive care include?  A yearly physical exam. This is also called an annual well check.  Dental exams once or twice a year.  Routine eye exams. Ask your health care provider how often you should have your eyes checked.  Personal lifestyle choices, including: ? Daily care of your teeth and gums. ? Regular physical activity. ? Eating a healthy diet. ? Avoiding tobacco and drug use. ? Limiting alcohol use. ? Practicing safe sex. ? Taking low-dose aspirin every day. ? Taking vitamin and mineral supplements as recommended by your health care provider. What happens during an annual well check? The services and screenings done by your health care provider during your annual well check will depend on your age, overall health, lifestyle risk factors, and family history of disease. Counseling Your health care provider may ask you questions about your:  Alcohol use.  Tobacco use.  Drug use.  Emotional well-being.  Home and relationship well-being.  Sexual activity.  Eating habits.  History of falls.  Memory and ability to understand (cognition).  Work and work environment.  Reproductive health.  Screening You may have the following tests or measurements:  Height, weight, and BMI.  Blood pressure.  Lipid and cholesterol levels. These may be checked every 5 years, or more frequently if you are over 50 years old.  Skin check.  Lung cancer screening. You may have this screening every year starting at age 55 if you have a 30-pack-year history of smoking and currently smoke or have quit within the past 15 years.  Fecal occult blood test (FOBT) of the stool. You may have this test every year starting at age 50.  Flexible sigmoidoscopy or colonoscopy. You may have a sigmoidoscopy every 5 years or  a colonoscopy every 10 years starting at age 50.  Hepatitis C blood test.  Hepatitis B blood test.  Sexually transmitted disease (STD) testing.  Diabetes screening. This is done by checking your blood sugar (glucose) after you have not eaten for a while (fasting). You may have this done every 1-3 years.  Bone density scan. This is done to screen for osteoporosis. You may have this done starting at age 65.  Mammogram. This may be done every 1-2 years. Talk to your health care provider about how often you should have regular mammograms.  Talk with your health care provider about your test results, treatment options, and if necessary, the need for more tests. Vaccines Your health care provider may recommend certain vaccines, such as:  Influenza vaccine. This is recommended every year.  Tetanus, diphtheria, and acellular pertussis (Tdap, Td) vaccine. You may need a Td booster every 10 years.  Varicella vaccine. You may need this if you have not been vaccinated.  Zoster vaccine. You may need this after age 60.  Measles, mumps, and rubella (MMR) vaccine. You may need at least one dose of MMR if you were born in 1957 or later. You may also need a second dose.  Pneumococcal 13-valent conjugate (PCV13) vaccine. One dose is recommended after age 65.  Pneumococcal polysaccharide (PPSV23) vaccine. One dose is recommended after age 65.  Meningococcal vaccine. You may need this if you have certain conditions.  Hepatitis A vaccine. You may need this if you have certain conditions or if you travel or work in places where you may be exposed to hepatitis   A.  Hepatitis B vaccine. You may need this if you have certain conditions or if you travel or work in places where you may be exposed to hepatitis B.  Haemophilus influenzae type b (Hib) vaccine. You may need this if you have certain conditions.  Talk to your health care provider about which screenings and vaccines you need and how often you  need them. This information is not intended to replace advice given to you by your health care provider. Make sure you discuss any questions you have with your health care provider. Document Released: 10/30/2015 Document Revised: 06/22/2016 Document Reviewed: 08/04/2015 Elsevier Interactive Patient Education  2017 [employer].

## 2017-09-19 LAB — COMPREHENSIVE METABOLIC PANEL
ALBUMIN: 4.3 g/dL (ref 3.5–5.2)
ALT: 9 U/L (ref 0–35)
AST: 15 U/L (ref 0–37)
Alkaline Phosphatase: 83 U/L (ref 39–117)
BUN: 24 mg/dL — AB (ref 6–23)
CHLORIDE: 106 meq/L (ref 96–112)
CO2: 30 meq/L (ref 19–32)
CREATININE: 1.39 mg/dL — AB (ref 0.40–1.20)
Calcium: 10 mg/dL (ref 8.4–10.5)
GFR: 38.45 mL/min — ABNORMAL LOW (ref >60.00)
GLUCOSE: 99 mg/dL (ref 70–99)
POTASSIUM: 5.1 meq/L (ref 3.5–5.1)
SODIUM: 144 meq/L (ref 135–145)
Total Bilirubin: 0.4 mg/dL (ref 0.2–1.2)
Total Protein: 6.9 g/dL (ref 6.0–8.3)

## 2017-09-19 LAB — LIPID PANEL
Cholesterol: 173 mg/dL (ref 0–200)
HDL: 46.8 mg/dL (ref >39.00)
NONHDL: 126.16
TRIGLYCERIDES: 201 mg/dL — AB (ref 0.0–149.0)
Total CHOL/HDL Ratio: 4
VLDL: 40.2 mg/dL — AB (ref 0.0–40.0)

## 2017-09-19 LAB — LDL CHOLESTEROL, DIRECT: Direct LDL: 96 mg/dL

## 2017-09-20 ENCOUNTER — Other Ambulatory Visit: Payer: Self-pay | Admitting: Family Medicine

## 2017-09-20 ENCOUNTER — Other Ambulatory Visit: Payer: Self-pay | Admitting: *Deleted

## 2017-09-20 DIAGNOSIS — E785 Hyperlipidemia, unspecified: Secondary | ICD-10-CM

## 2017-09-20 DIAGNOSIS — E876 Hypokalemia: Secondary | ICD-10-CM

## 2017-09-20 DIAGNOSIS — I10 Essential (primary) hypertension: Secondary | ICD-10-CM

## 2018-02-07 ENCOUNTER — Other Ambulatory Visit: Payer: Self-pay | Admitting: Family Medicine

## 2018-02-07 DIAGNOSIS — I10 Essential (primary) hypertension: Secondary | ICD-10-CM

## 2018-02-15 ENCOUNTER — Telehealth: Payer: Self-pay | Admitting: Family Medicine

## 2018-02-15 NOTE — Telephone Encounter (Signed)
Copied from CRM [id number]. Topic: Quick Communication - See Telephone Encounter >> Feb 15, 2018  2:06 PM Windy Kalata, NT wrote: CRM for notification. See Telephone encounter for: 02/15/18.  Patient is calling and states she needs a refill on Pravastatin sodium  tablets. I do not see this on her file. Please advise.  [id number] [place], [place] - [phone] AT [facility] [address] [place] [address] Phone: [phone] Fax: [phone]

## 2018-02-16 MED ORDER — PRAVASTATIN SODIUM 20 MG PO TABS: 20.0000 mg | ORAL_TABLET | Freq: Every day | ORAL | 1 refills | Status: DC

## 2018-02-16 NOTE — Telephone Encounter (Signed)
Left message on machine that rx has been sent I.

## 2018-02-22 ENCOUNTER — Emergency Department (HOSPITAL_COMMUNITY)
Admission: EM | Admit: 2018-02-22 | Discharge: 2018-02-22 | Disposition: A | Discharge status: Home / Self Care | Payer: Medicare Other | Attending: Emergency Medicine | Admitting: Emergency Medicine

## 2018-02-22 ENCOUNTER — Emergency Department (HOSPITAL_COMMUNITY): Payer: Medicare Other

## 2018-02-22 ENCOUNTER — Encounter (HOSPITAL_COMMUNITY): Payer: Self-pay | Admitting: *Deleted

## 2018-02-22 ENCOUNTER — Other Ambulatory Visit: Payer: Self-pay

## 2018-02-22 DIAGNOSIS — Z79899 Other long term (current) drug therapy: Secondary | ICD-10-CM | POA: Diagnosis not present

## 2018-02-22 DIAGNOSIS — I10 Essential (primary) hypertension: Secondary | ICD-10-CM | POA: Diagnosis not present

## 2018-02-22 DIAGNOSIS — M25559 Pain in unspecified hip: Secondary | ICD-10-CM

## 2018-02-22 DIAGNOSIS — Z7982 Long term (current) use of aspirin: Secondary | ICD-10-CM | POA: Insufficient documentation

## 2018-02-22 DIAGNOSIS — M79605 Pain in left leg: Secondary | ICD-10-CM

## 2018-02-22 DIAGNOSIS — M25552 Pain in left hip: Secondary | ICD-10-CM | POA: Diagnosis present

## 2018-02-22 DIAGNOSIS — E039 Hypothyroidism, unspecified: Secondary | ICD-10-CM | POA: Diagnosis not present

## 2018-02-22 LAB — BASIC METABOLIC PANEL
ANION GAP: 11 (ref 5–15)
BUN: 29 mg/dL — ABNORMAL HIGH (ref 6–20)
CALCIUM: 9.4 mg/dL (ref 8.9–10.3)
CO2: 24 mmol/L (ref 22–32)
Chloride: 104 mmol/L (ref 101–111)
Creatinine, Ser: 1.56 mg/dL — ABNORMAL HIGH (ref 0.44–1.00)
GFR, EST AFRICAN AMERICAN: 34 mL/min — AB (ref >60)
GFR, EST NON AFRICAN AMERICAN: 30 mL/min — AB (ref >60)
Glucose, Bld: 116 mg/dL — ABNORMAL HIGH (ref 65–99)
Potassium: 3.2 mmol/L — ABNORMAL LOW (ref 3.5–5.1)
Sodium: 139 mmol/L (ref 135–145)

## 2018-02-22 LAB — HEPATIC FUNCTION PANEL
ALBUMIN: 4 g/dL (ref 3.5–5.0)
ALT: 12 U/L — ABNORMAL LOW (ref 14–54)
AST: 18 U/L (ref 15–41)
Alkaline Phosphatase: 90 U/L (ref 38–126)
Bilirubin, Direct: 0.1 mg/dL (ref 0.1–0.5)
Indirect Bilirubin: 0.5 mg/dL (ref 0.3–0.9)
Total Bilirubin: 0.6 mg/dL (ref 0.3–1.2)
Total Protein: 6.8 g/dL (ref 6.5–8.1)

## 2018-02-22 LAB — CBC
HEMATOCRIT: 38.4 % (ref 36.0–46.0)
Hemoglobin: 12.6 g/dL (ref 12.0–15.0)
MCH: 31.8 pg (ref 26.0–34.0)
MCHC: 32.8 g/dL (ref 30.0–36.0)
MCV: 97 fL (ref 78.0–100.0)
PLATELETS: 234 10*3/uL (ref 150–400)
RBC: 3.96 MIL/uL (ref 3.87–5.11)
RDW: 12.9 % (ref 11.5–15.5)
WBC: 6.9 10*3/uL (ref 4.0–10.5)

## 2018-02-22 LAB — I-STAT TROPONIN, ED: Troponin i, poc: 0.02 ng/mL (ref 0.00–0.08)

## 2018-02-22 LAB — LIPASE, BLOOD: Lipase: 41 U/L (ref 11–51)

## 2018-02-22 MED ORDER — DEXAMETHASONE SODIUM PHOSPHATE 10 MG/ML IJ SOLN
10.0000 mg | Freq: Once | INTRAMUSCULAR | Status: AC
Start: 2018-02-22 — End: 2018-02-22
  Administered 2018-02-22: 10 mg via INTRAVENOUS
  Filled 2018-02-22: qty 1

## 2018-02-22 MED ORDER — IOPAMIDOL (ISOVUE-370) INJECTION 76%
100.0000 mL | Freq: Once | INTRAVENOUS | Status: AC | PRN
  Administered 2018-02-22: 80 mL via INTRAVENOUS

## 2018-02-22 MED ORDER — MORPHINE SULFATE (PF) 4 MG/ML IV SOLN
4.0000 mg | Freq: Once | INTRAVENOUS | Status: AC
  Administered 2018-02-22: 4 mg via INTRAVENOUS
  Filled 2018-02-22: qty 1

## 2018-02-22 MED ORDER — OXYCODONE-ACETAMINOPHEN 5-325 MG PO TABS: 1.0000 | ORAL_TABLET | Freq: Four times a day (QID) | ORAL | 0 refills | Status: DC | PRN

## 2018-02-22 MED ORDER — IOPAMIDOL (ISOVUE-370) INJECTION 76%
INTRAVENOUS | Status: AC
  Filled 2018-02-22: qty 100

## 2018-02-22 MED ORDER — KETOROLAC TROMETHAMINE 30 MG/ML IJ SOLN
15.0000 mg | Freq: Once | INTRAMUSCULAR | Status: AC
  Administered 2018-02-22: 15 mg via INTRAVENOUS
  Filled 2018-02-22: qty 1

## 2018-02-22 MED ORDER — OXYCODONE-ACETAMINOPHEN 5-325 MG PO TABS
1.0000 | ORAL_TABLET | Freq: Once | ORAL | Status: AC
  Administered 2018-02-22: 1 via ORAL
  Filled 2018-02-22: qty 1

## 2018-02-22 NOTE — ED Provider Notes (Signed)
[facility]-EMERGENCY DEPT Provider Note   CSN: [id number] Arrival date & time: 02/22/18  0114     History   Chief Complaint Chief Complaint  Patient presents with  . Leg Pain  . Hip Pain    left side no fall or no recent injury or trauma     HPI Belinda Anderson is a 84 y.o. female.  HPI Patient is a 84 year old female presents the emergency department acute onset left radiation down her left buttock and down her left lateral leg to the level of the knee.  She does have a history of sciatica on the right side and a history of severe spinal stenosis based on MRI in 2018.  She is never had left-sided symptoms before.  She states she was having some mild discomfort last night prior to bed but then awoke with severe unrelenting pain down her left buttock.  Family was unable to get her out of the bed and thus EMS was called.  No recent injury or trauma as far as family knows.  She does have some mild memory issues and therefore they do not believe that she has the most reliable historian.  She denies chest pain.  No abdominal pain.  No upper back pain.  No shortness of breath.  No recent fevers or chills.  No urinary complaints.  No history of kidney stones.  No other complaints at this time.  Pain in her left lower extremity is severe in severity at this time.  No history of peripheral vascular disease   Past Medical History:  Diagnosis Date  . Arthritis   . Hypertension   . Thyroid disease     Patient Active Problem List   Diagnosis Date Noted  . Preventative health care 09/18/2017  . Low back pain radiating to right leg 11/28/2016  . Hyperlipidemia 02/02/2015  . Sciatica 07/16/2014  . Knee pain, acute 07/16/2014  . Lower extremity pain, right 07/16/2014  . Skin infection 07/16/2014  . Incontinence of urine in female 06/19/2013  . Cerebral aneurysm rupture (HCC) 04/08/2013  . LEG EDEMA, RIGHT 07/06/2010  . OTHER DRUG ALLERGY 07/06/2010  . DIARRHEA  02/12/2010  . HEMATURIA, HX OF 11/24/2009  . ANEURYSM, HX OF 08/03/2009  . B12 DEFICIENCY 07/24/2009  . 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 SYNCOPE 07/20/2009  . DIZZINESS 07/20/2009  . 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 Hypothyroidism 12/14/2007  . Essential hypertension 12/14/2007  . DIVERTICULITIS, HX OF 12/14/2007  . GOITER 01/30/2007  . HYPERTENSION, BENIGN 01/30/2007  . UTERINE POLYP 01/30/2007  . THYROIDECTOMY, HX OF 01/30/2007    Past Surgical History:  Procedure Laterality Date  . EYE SURGERY     lens implant     OB History   None      Home Medications    Prior to Admission medications   Medication Sig Start Date End Date Taking? Authorizing Provider  Acetaminophen (TYLENOL ARTHRITIS PAIN PO) Take 1 tablet by mouth every 6 (six) hours as needed (pain).    Yes [provider]  amLODipine (NORVASC) 2.5 MG tablet TAKE 1 TABLET BY MOUTH ONCE DAILY 02/07/18  Yes [name], DO  aspirin EC 325 MG EC tablet Take 325 mg by mouth daily.     Yes [provider]  hydrochlorothiazide (HYDRODIURIL) 25 MG tablet Take 1 tablet (25 mg total) by mouth daily. 06/20/17  Yes [name], DO  levothyroxine (SYNTHROID, LEVOTHROID) 27  MCG tablet Take 1 tablet (88 mcg total) by mouth daily. 06/20/17  Yes Seabron Spates R, DO  potassium chloride SA (K-DUR,KLOR-CON) 20 MEQ tablet take 1 tablet by mouth once daily 09/20/17  Yes Lowne Florina Ou R, DO  pravastatin (PRAVACHOL) 20 MG tablet Take 1 tablet (20 mg total) by mouth daily. 02/16/18  Yes Seabron Spates R, DO  benzonatate (TESSALON) 100 MG capsule Take 1 capsule (100 mg total) by mouth 3 (three) times daily as needed for cough. Patient not taking: Reported on 02/22/2018 09/22/16   Sharlene Dory, DO  fluticasone [facility]) 50 MCG/ACT nasal spray Place 2 sprays into both nostrils daily. Patient not taking: Reported on 02/22/2018 12/29/15    Saguier, Ramon Dredge, PA-C  NONFORMULARY OR COMPOUNDED ITEM Compression stockings 20-30 mm hg  #1  Dx edema 12/16/14   [name], DO    Family History Family History  Problem Relation Age of Onset  . Arthritis Mother   . Heart disease Mother   . Heart disease Father 55       MI  . Coronary artery disease Unknown   . Diabetes Brother        DI    Social History Social History   Tobacco Use  . Smoking status: Never Smoker  . Smokeless tobacco: Never Used  Substance Use Topics  . Alcohol use: No  . Drug use: No     Allergies   Patient has no known allergies.   Review of Systems Review of Systems  All other systems reviewed and are negative.    Physical Exam Updated Vital Signs BP (!) 154/84   Pulse 68   Temp 98.5 F (36.9 C)   Resp 18   SpO2 98%   Physical Exam  Constitutional: She is oriented to person, place, and time. She appears well-developed and well-nourished. No distress.  HENT:  Head: Normocephalic and atraumatic.  Eyes: EOM are normal.  Neck: Normal range of motion.  Cardiovascular: Normal rate, regular rhythm and normal heart sounds.  Pulmonary/Chest: Effort normal and breath sounds normal.  Abdominal: Soft. She exhibits no distension. There is no tenderness.  Musculoskeletal: Normal range of motion.  Full range of motion bilateral hips, knees, ankles.  No swelling of the left lower extremity as compared to the right.  Tenderness of the left sciatic groove and tenderness of the left low back without midline lumbar tenderness.  No obvious bruising or swelling noted of the left lower extremity.  She does have a palpable PT pulse in the left foot.  There is no palpable or dopplerable pulse in the left DP.  She does have PT and DP pulses in the right foot.  Her left foot however is normal in color and has normal sensation.  Her left foot has normal function.  Her toes and left foot are perfused.  Neurological: She is alert and oriented to person,  place, and time.  Skin: Skin is warm and dry.  Psychiatric: She has a normal mood and affect. Judgment normal.  Nursing note and vitals reviewed.    ED Treatments / Results  Labs (all labs ordered are listed, but only abnormal results are displayed) Labs Reviewed  BASIC METABOLIC PANEL - Abnormal; Notable for the following components:      Result Value   Potassium 3.2 (*)    Glucose, Bld 116 (*)    BUN 29 (*)    Creatinine, Ser 1.56 (*)    GFR calc non Af  Amer 30 (*)    GFR calc Af Amer 34 (*)    All other components within normal limits  CBC  HEPATIC FUNCTION PANEL  LIPASE, BLOOD    EKG EKG Interpretation  Date/Time:  Thursday Feb 22 2018 07:14:44 EDT Ventricular Rate:  69 PR Interval:    QRS Duration: 130 QT Interval:  451 QTC Calculation: 484 R Axis:   53 Text Interpretation:  Sinus rhythm Atrial premature complex Right bundle branch block Nonspecific T abnormalities, lateral leads No old tracing to compare Confirmed by Azalia Bilis ([id number]) on 02/22/2018 7:17:45 AM   Radiology Dg Lumbar Spine Complete  Result Date: 02/22/2018 CLINICAL DATA:  Low back pain radiating to the left hip for 2 weeks. EXAM: LUMBAR SPINE - COMPLETE 4+ VIEW COMPARISON:  MRI 12/31/2016 FINDINGS: There are 5 lumbar type vertebral bodies. No acute fracture or suspicious osseous lesions. Stable grade 1 anterolisthesis of L3 on L4. Marked disc flattening L4-5 with mild disc flattening L1 through L3 and L5-S1. Multilevel degenerative facet arthropathy with joint space narrowing, sclerosis and hypertrophy. IMPRESSION: Lumbar spondylosis without acute fracture. Chronic stable L3 on L4 grade 1 anterolisthesis. Electronically Signed   By: Tollie Eth M.D.   On: 02/22/2018 03:47   Dg Hip Unilat W Or W/o Pelvis 2-3 Views Left  Result Date: 02/22/2018 CLINICAL DATA:  Back pain radiating to the left hip for 2 weeks. EXAM: DG HIP (WITH OR WITHOUT PELVIS) 2-3V LEFT COMPARISON:  None. FINDINGS: Marked disc  flattening of the included lumbar spine from L3 through S1 more so at L4-5 and L5-S1. Associated facet arthropathy is seen. Bony pelvis and hips are intact. Slight degenerative joint space narrowing of both hips. Both femoral heads are spherical in appearance without flattening or evidence of femoroacetabular impingement. IMPRESSION: There is degenerative disc and facet arthropathy of the included lower lumbar spine. No acute pelvic fracture or suspicious osseous lesions. Mild degenerative joint space narrowing of both hips. Electronically Signed   By: Tollie Eth M.D.   On: 02/22/2018 03:49    Procedures Procedures (including critical care time)  Medications Ordered in ED Medications  morphine 4 MG/ML injection 4 mg (has no administration in time range)  oxyCODONE-acetaminophen (PERCOCET/ROXICET) 5-325 MG per tablet 1 tablet (1 tablet Oral Given 02/22/18 0229)  morphine 4 MG/ML injection 4 mg (4 mg Intravenous Given 02/22/18 0605)  ketorolac (TORADOL) 30 MG/ML injection 15 mg (15 mg Intravenous Given 02/22/18 0604)  dexamethasone (DECADRON) injection 10 mg (10 mg Intravenous Given 02/22/18 0604)     Initial Impression / Assessment and Plan / ED Course  I have reviewed the triage vital signs and the nursing notes.  Pertinent labs & imaging results that were available during my care of the patient were reviewed by me and considered in my medical decision making (see chart for details).    Patient with a perfused left lower extremity at this time.  Her symptoms seem more consistent with sciatica.  She does have a history of severe spinal stenosis.  MRI from 2018 reviewed.  Please see electronic medical record for for full details of this MRI.  Patient continues to have some pain at this time.  Will attempt additional pain meds.  Remains with stable vital signs.  Continues to complain of no chest or abdominal pain.  Alerted by family that the patient is now having severe upper abdominal pain.  Denies  chest pain.  Denies upper back pain.  She actually reports that her pain in her  left lower extremity is somewhat feeling better at this time.  7:30 AM Patient now having severe upper abdominal pain.  She states her left gluteal and left leg pain is improving but now she looks extremely uncomfortable and is somewhat pale having severe upper abdominal discomfort.  Given the presentation now of upper abdominal discomfort back pain and pain radiating down her left leg concern for possible dissection versus ruptured aortic aneurysm.  Bedside ultrasound demonstrates no significant dilatation of the abdominal aorta however there is an abnormal hyper echoic area within the aorta just proximal to the bifurcation which is concerning.  She will undergo CT angio chest abdomen pelvis with runoff into the left leg  Patient does have a reduced GFR.  She will be given a reduced dose of contrast.  I had a long discussion with the patient's family member regarding IV contrast and the possible nephrotoxicity of this.  At this time I feel as though the benefit outweighs the risk.  Family is in agreement with this.  This is a tough situation as the patient does have some memory disturbance and short-term memory issues.  Family understands this as well.  Her history is difficult and unreliable at times.  8:45 AM Care to Dr Charm Barges to follow up on CT imaging and final disposition  Final Clinical Impressions(s) / ED Diagnoses   Final diagnoses:  Hip pain    ED Discharge Orders    None       Azalia Bilis, MD 02/22/18 [phone]

## 2018-02-22 NOTE — ED Triage Notes (Signed)
Pt comes to ed via ems, c/o hip pain left side leg pain radiating downward, arthritis  and leg pain with no history of sciatica. V/s on arrival 163/74, hr 84, so2 94, rr16, 20 LAC 150 mcg fen tyle  Pt comes from home, no recent falls or injuries. Pt allergies none.

## 2018-02-22 NOTE — ED Notes (Signed)
Bed: ZO10 Expected date:  Expected time:  Means of arrival:  Comments: 85yo F Arthritis

## 2018-02-22 NOTE — ED Notes (Signed)
Patient transported to X-ray 

## 2018-02-22 NOTE — ED Provider Notes (Signed)
Signout from Dr. Patria Mane.  84 year old female who awoke with left flank left hip pain radiating down her left leg.  She has a prior history of some spinal stenosis per an old MRI.  She received some pain control lab work and plain film imaging that did not show an obvious cause of her complaints.  Since then she now has severe upper abdominal pain.  She is pending a CT angio chest abdomen and pelvis to eval for possible vascular cause.   Plan is to follow-up on imaging results.  Clinical Course as of Feb 22 1810  Thu Feb 22, 2018  0981 CTA with no acute findings.  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   [MB]  [id number] Evaluated patient.  She states her chest pain is completely resolved and lasted may be an hour.  She also feels her leg pain is controlled and she would like to go home.  Her daughter is here and says she will stay anything to get to go home.  She is able to tolerate a lot of pain.  We can add on a trop as she had this chest pain and get her some food and will get her up a walker and see how she can do.   [MB]  1234 Patient was seen by case management and is willing to have home services including physical therapy.  I have ordered her bedside commode in the face-to-face.  She will be sent home with some steroids and pain medicine   [MB]  1235 .   [MB]    Clinical Course User Index [MB] Terrilee Files, MD      Terrilee Files, MD 02/22/18 [phone]

## 2018-02-22 NOTE — Discharge Instructions (Signed)
You were evaluated in the emergency department for acute onset of left hip and leg pain.  You had lab work and x-rays and CT that did not show an obvious cause of your symptoms.  This is possibly sciatica or some sort of muscular pain.  You should try some ibuprofen 3 times a day as tolerated.  We are also prescribing some pain medicine, this may be sedating and so you should use care and taking it.  You are being sent home with home health services.  It is important that you call your doctor and follow-up with them and return if any worsening symptoms.

## 2018-02-22 NOTE — Care Management Note (Signed)
Case Management Note  Patient Details  Name: Belinda Anderson MRN: [id number] Date of Birth: 02-05-1932  CM consulted for discharge planning by Dr. Charm Barges.  Discussed options and HH.  CM spoke with pt, daughter, brother, and son-in-law at bedside.  Discussed HH and DME.  Pt agreeable to whoever is in-network with her insurance and is agreeable to a 3-in-1.  Updated Dr. Charm Barges who placed orders.  Contacted Clydie Braun with [facility] who advised they can accept pt's insurance and will bring the 3-in-1 to her room prior to D/C.  No further CM needs noted at this time.  Expected Discharge Date:   02/22/2018               Expected Discharge Plan:  Home w Home Health Services  Discharge planning Services  CM Consult  Post Acute Care Choice:  Home Health Choice offered to:  Patient, Adult Children, Sibling  DME Arranged:  3-N-1 DME Agency:  [facility]  HH Arranged:  PT, OT, Social Work [employer] Agency:  [facility]  Status of Service:  Completed, signed off  Kalel Harty, Lynnae Sandhoff, RN 02/22/2018, 12:39 PM

## 2018-03-01 ENCOUNTER — Ambulatory Visit: Payer: Medicare Other | Admitting: Family Medicine

## 2018-03-01 ENCOUNTER — Encounter: Payer: Self-pay | Admitting: Family Medicine

## 2018-03-01 DIAGNOSIS — M545 Low back pain, unspecified: Secondary | ICD-10-CM

## 2018-03-01 DIAGNOSIS — M79605 Pain in left leg: Secondary | ICD-10-CM

## 2018-03-01 MED ORDER — PREDNISONE 10 MG PO TABS: ORAL_TABLET | ORAL | 0 refills | Status: DC

## 2018-03-01 MED ORDER — OXYCODONE-ACETAMINOPHEN 5-325 MG PO TABS: 1.0000 | ORAL_TABLET | Freq: Four times a day (QID) | ORAL | 0 refills | Status: DC | PRN

## 2018-03-01 NOTE — Patient Instructions (Signed)
You have spinal stenosis with radiculopathy into your left leg. Take the prednisone dose pack as directed. Don't take aleve or ibuprofen with this. Ok to take tylenol during the day with the oxycodone at bedtime and in the morning. Follow up with me in 4 weeks if you're doing well - call me sooner if you're struggling.

## 2018-03-02 ENCOUNTER — Telehealth: Payer: Self-pay | Admitting: Family Medicine

## 2018-03-02 ENCOUNTER — Telehealth: Payer: Self-pay | Admitting: *Deleted

## 2018-03-02 NOTE — Telephone Encounter (Signed)
Ok to give orders  °

## 2018-03-02 NOTE — Telephone Encounter (Signed)
Copied from CRM [id number]. Topic: Quick Communication - See Telephone Encounter >> Mar 02, 2018 11:51 AM Arlyss Gandy, NT wrote: CRM for notification. See Telephone encounter for: 03/02/18. Beth with [facility] calling to get nursing evaluation order for blisters on her back from using the heating pad to much. CB#: [phone]

## 2018-03-02 NOTE — Telephone Encounter (Signed)
Received Physician Orders from [facility]; forwarded to provider/SLS 05/17  

## 2018-03-03 ENCOUNTER — Encounter: Payer: Self-pay | Admitting: Family Medicine

## 2018-03-03 NOTE — Progress Notes (Signed)
PCP and consultation requested by: Seabron Spates R, DO  Subjective:   HPI: Patient is a 84 y.o. female here for low back, left leg pain.  2/7: Patient reports she's had about 2 weeks of worsening midline low back pain radiating to right side into buttock and down to foot. No prior history of similar problem. Pain is 9/10, sharp. No numbness or tingling. Using heating pad. No injury or trauma. Difficulty sleeping, getting comfortable due to pain. No skin changes. No bowel/bladder dysfunction.  5/30: Patient returns for low back follow up. She is still getting pain in low back radiating down the right leg. Did well following prednisone. She also reports injections definitely helped her for 4 weeks. Associated numbness in right leg. Pain is sharp, worse with walking. Takes some over the counter medicine when really hurts and this does help some. No bowel/bladder dysfunction. No skin changes.  04/14/17: Patient reports she feels about the same as last visit. Doesn't feel repeat injection helped her this time. Taking tylenol regularly. Pain still radiates into her right foot with numbness, leg feels weak. Pain level 0/10 but up to 6/10 and sharp. No bowel/bladder dysfunction. No skin changes.  5/16: Patient reports since about 5/8 she's had sharp pain from posterior hip down into her left foot. Woke up with this pain. No numbness, bowel/bladder dysfunction. Has been doing PT at home. Some benefit taking oxycodone. Pain level currently 5/10, sharp.  Past Medical History:  Diagnosis Date  . Arthritis   . Hypertension   . Thyroid disease     Current Outpatient Medications on File Prior to Visit  Medication Sig Dispense Refill  . Acetaminophen (TYLENOL ARTHRITIS PAIN PO) Take 1 tablet by mouth every 6 (six) hours as needed (pain).     Marland Kitchen amLODipine (NORVASC) 2.5 MG tablet TAKE 1 TABLET BY MOUTH ONCE DAILY 90 tablet 0  . aspirin EC 325 MG EC tablet Take 325 mg by  mouth daily.      . benzonatate (TESSALON) 100 MG capsule Take 1 capsule (100 mg total) by mouth 3 (three) times daily as needed for cough. (Patient not taking: Reported on 02/22/2018) 20 capsule 0  . fluticasone (FLONASE) 50 MCG/ACT nasal spray Place 2 sprays into both nostrils daily. (Patient not taking: Reported on 02/22/2018) 16 g 1  . hydrochlorothiazide (HYDRODIURIL) 25 MG tablet Take 1 tablet (25 mg total) by mouth daily. 90 tablet 3  . levothyroxine (SYNTHROID, LEVOTHROID) 88 MCG tablet Take 1 tablet (88 mcg total) by mouth daily. 90 tablet 3  . NONFORMULARY OR COMPOUNDED ITEM Compression stockings 20-30 mm hg  #1  Dx edema 1 each 0  . potassium chloride SA (K-DUR,KLOR-CON) 20 MEQ tablet take 1 tablet by mouth once daily 30 tablet 5  . pravastatin (PRAVACHOL) 20 MG tablet Take 1 tablet (20 mg total) by mouth daily. 90 tablet 1   No current facility-administered medications on file prior to visit.     Past Surgical History:  Procedure Laterality Date  . EYE SURGERY     lens implant    No Known Allergies  Social History   Socioeconomic History  . Marital status: Widowed    Spouse name: Not on file  . Number of children: Not on file  . Years of education: Not on file  . Highest education level: Not on file  Occupational History  . Not on file  Social Needs  . Financial resource strain: Not on file  . Food insecurity:  Worry: Not on file    Inability: Not on file  . Transportation needs:    Medical: Not on file    Non-medical: Not on file  Tobacco Use  . Smoking status: Never Smoker  . Smokeless tobacco: Never Used  Substance and Sexual Activity  . Alcohol use: No  . Drug use: No  . Sexual activity: Not Currently    Partners: Male  Lifestyle  . Physical activity:    Days per week: Not on file    Minutes per session: Not on file  . Stress: Not on file  Relationships  . Social connections:    Talks on phone: Not on file    Gets together: Not on file    Attends  religious service: Not on file    Active member of club or organization: Not on file    Attends meetings of clubs or organizations: Not on file    Relationship status: Not on file  . Intimate partner violence:    Fear of current or ex partner: Not on file    Emotionally abused: Not on file    Physically abused: Not on file    Forced sexual activity: Not on file  Other Topics Concern  . 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 Arthritis Mother   . Heart disease Mother   . Heart disease Father 69       MI  . Coronary artery disease Unknown   . Diabetes Brother        DI    BP 128/66   Pulse 79   Ht  (1.676 m)   Wt 145 lb (65.8 kg)   BMI 23.40 kg/m   Review of Systems: See HPI above.     Objective:  Physical Exam:  Gen: NAD, comfortable in exam room  Back: No gross deformity, scoliosis. No paraspinal TTP .  No midline or bony TTP. ROM limited to 60 degrees flexion, 5 degrees extension - worse with extension. Strength LEs 5/5 all muscle groups.   2+ MSRs in patellar and achilles tendons, equal bilaterally. Negative SLRs. Sensation intact to light touch bilaterally.  Left hip: No deformity. FROM with 5/5 strength. No tenderness to palpation. NVI distally. Negative logroll. Negative fabers, piriformis.   Assessment & Plan:  1. Low back pain with radiation into left leg - known facet arthropathy and spinal stenosis.  Discussed options - she will continue with home PT, focus on flexion based program.  Prednisone dose pack.  Tylenol with oxycodone at bedtime.  F/u in 4 weeks

## 2018-03-03 NOTE — Assessment & Plan Note (Signed)
known facet arthropathy and spinal stenosis.  Discussed options - she will continue with home PT, focus on flexion based program.  Prednisone dose pack.  Tylenol with oxycodone at bedtime.  F/u in 4 weeks

## 2018-03-05 NOTE — Telephone Encounter (Signed)
Verbal orders given  

## 2018-03-13 ENCOUNTER — Telehealth: Payer: Self-pay | Admitting: *Deleted

## 2018-03-13 ENCOUNTER — Telehealth: Payer: Self-pay | Admitting: Family Medicine

## 2018-03-13 NOTE — Telephone Encounter (Signed)
Received Home Health Certification and Plan of Care; forwarded to provider/SLS 05/28

## 2018-03-13 NOTE — Telephone Encounter (Signed)
Patient's daughter calling stating that patient's sciatica is not getting anything better. She is still doing physical therapy but is seeing no improvement

## 2018-03-13 NOTE — Telephone Encounter (Signed)
The next options would be a more extended course of prednisone, trying an injection by [facility].  Referral to a surgeon would be typically if these still didn't help her.

## 2018-03-13 NOTE — Telephone Encounter (Signed)
Spoke with daughter and she would like to go ahead with the injection at [facility]

## 2018-03-14 NOTE — Telephone Encounter (Signed)
Ok to go ahead with ESI on left at L3-4 for spinal stenosis.  Thanks!

## 2018-03-15 ENCOUNTER — Telehealth: Payer: Self-pay | Admitting: *Deleted

## 2018-03-15 NOTE — Addendum Note (Signed)
Addended by: Kathi Simpers F on: 03/15/2018 12:05 PM   Modules accepted: Orders

## 2018-03-15 NOTE — Telephone Encounter (Signed)
Received Physician Orders from [facility]; forwarded to provider/SLS 05/30  

## 2018-03-20 ENCOUNTER — Ambulatory Visit: Payer: Medicare Other

## 2018-03-20 ENCOUNTER — Ambulatory Visit: Payer: Medicare Other | Admitting: Family Medicine

## 2018-03-29 ENCOUNTER — Encounter: Payer: Self-pay | Admitting: Family Medicine

## 2018-03-29 ENCOUNTER — Ambulatory Visit: Payer: Medicare Other | Admitting: Family Medicine

## 2018-03-29 DIAGNOSIS — M545 Low back pain, unspecified: Secondary | ICD-10-CM

## 2018-03-29 DIAGNOSIS — M79605 Pain in left leg: Secondary | ICD-10-CM | POA: Diagnosis not present

## 2018-03-29 NOTE — Patient Instructions (Signed)
You're doing great! If you start to feel the pain coming back, restart your home exercises. Use cane when up and walking around for support. Follow up with me as needed.

## 2018-03-30 ENCOUNTER — Encounter: Payer: Self-pay | Admitting: Family Medicine

## 2018-03-30 NOTE — Progress Notes (Signed)
PCP and consultation requested by: Seabron Spates R, DO  Subjective:   HPI: Patient is a 84 y.o. female here for low back, left leg pain.  2/7: Patient reports she's had about 2 weeks of worsening midline low back pain radiating to right side into buttock and down to foot. No prior history of similar problem. Pain is 9/10, sharp. No numbness or tingling. Using heating pad. No injury or trauma. Difficulty sleeping, getting comfortable due to pain. No skin changes. No bowel/bladder dysfunction.  5/30: Patient returns for low back follow up. She is still getting pain in low back radiating down the right leg. Did well following prednisone. She also reports injections definitely helped her for 4 weeks. Associated numbness in right leg. Pain is sharp, worse with walking. Takes some over the counter medicine when really hurts and this does help some. No bowel/bladder dysfunction. No skin changes.  04/14/17: Patient reports she feels about the same as last visit. Doesn't feel repeat injection helped her this time. Taking tylenol regularly. Pain still radiates into her right foot with numbness, leg feels weak. Pain level 0/10 but up to 6/10 and sharp. No bowel/bladder dysfunction. No skin changes.  03/01/18: Patient reports since about 5/8 she's had sharp pain from posterior hip down into her left foot. Woke up with this pain. No numbness, bowel/bladder dysfunction. Has been doing PT at home. Some benefit taking oxycodone. Pain level currently 5/10, sharp.  6/13: Patient reports she's doing very well. Pain level 0/10 level. No radiation into her left leg. No longer doing home exercises  No numbness, bowel/bladder dysfunction.  Past Medical History:  Diagnosis Date  . Arthritis   . Hypertension   . Thyroid disease     Current Outpatient Medications on File Prior to Visit  Medication Sig Dispense Refill  . Acetaminophen (TYLENOL ARTHRITIS PAIN PO) Take 1 tablet  by mouth every 6 (six) hours as needed (pain).     Marland Kitchen amLODipine (NORVASC) 2.5 MG tablet TAKE 1 TABLET BY MOUTH ONCE DAILY 90 tablet 0  . aspirin EC 325 MG EC tablet Take 325 mg by mouth daily.      . benzonatate (TESSALON) 100 MG capsule Take 1 capsule (100 mg total) by mouth 3 (three) times daily as needed for cough. (Patient not taking: Reported on 02/22/2018) 20 capsule 0  . fluticasone (FLONASE) 50 MCG/ACT nasal spray Place 2 sprays into both nostrils daily. (Patient not taking: Reported on 02/22/2018) 16 g 1  . hydrochlorothiazide (HYDRODIURIL) 25 MG tablet Take 1 tablet (25 mg total) by mouth daily. 90 tablet 3  . levothyroxine (SYNTHROID, LEVOTHROID) 88 MCG tablet Take 1 tablet (88 mcg total) by mouth daily. 90 tablet 3  . NONFORMULARY OR COMPOUNDED ITEM Compression stockings 20-30 mm hg  #1  Dx edema 1 each 0  . oxyCODONE-acetaminophen (PERCOCET/ROXICET) 5-325 MG tablet Take 1 tablet by mouth every 6 (six) hours as needed for severe pain. 20 tablet 0  . potassium chloride SA (K-DUR,KLOR-CON) 20 MEQ tablet take 1 tablet by mouth once daily 30 tablet 5  . pravastatin (PRAVACHOL) 20 MG tablet Take 1 tablet (20 mg total) by mouth daily. 90 tablet 1  . predniSONE (DELTASONE) 10 MG tablet 6 tabs po day 1, 5 tabs po day 2, 4 tabs po day 3, 3 tabs po day 4, 2 tabs po day 5, 1 tab po day 6 21 tablet 0   No current facility-administered medications on file prior to visit.  Past Surgical History:  Procedure Laterality Date  . EYE SURGERY     lens implant    No Known Allergies  Social History   Socioeconomic History  . Marital status: Widowed    Spouse name: Not on file  . Number of children: Not on file  . Years of education: Not on file  . Highest education level: Not on file  Occupational History  . Not on file  Social Needs  . Financial resource strain: Not on file  . Food insecurity:    Worry: Not on file    Inability: Not on file  . Transportation needs:    Medical: Not on  file    Non-medical: Not on file  Tobacco Use  . Smoking status: Never Smoker  . Smokeless tobacco: Never Used  Substance and Sexual Activity  . Alcohol use: No  . Drug use: No  . Sexual activity: Not Currently    Partners: Male  Lifestyle  . Physical activity:    Days per week: Not on file    Minutes per session: Not on file  . Stress: Not on file  Relationships  . Social connections:    Talks on phone: Not on file    Gets together: Not on file    Attends religious service: Not on file    Active member of club or organization: Not on file    Attends meetings of clubs or organizations: Not on file    Relationship status: Not on file  . Intimate partner violence:    Fear of current or ex partner: Not on file    Emotionally abused: Not on file    Physically abused: Not on file    Forced sexual activity: Not on file  Other Topics Concern  . 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 Arthritis Mother   . Heart disease Mother   . Heart disease Father [id number]       MI  . Coronary artery disease Unknown   . Diabetes Brother        DI    BP 131/64   Pulse 69   Ht 5\' 6"  (1.676 m)   Wt 140 lb (63.5 kg)   BMI 22.60 kg/m   Review of Systems: See HPI above.     Objective:  Physical Exam:  Gen: NAD, comfortable in exam room  Back: No gross deformity, scoliosis. No paraspinal TTP .  No midline or bony TTP. ROM limited to 60 degrees flexion, 5 degrees extension but without pain. Strength LEs 5/5 all muscle groups.   2+ MSRs in patellar and achilles tendons, equal bilaterally. Negative SLRs. Sensation intact to light touch bilaterally.  Left hip: No deformity. FROM with 5/5 strength. No tenderness to palpation. NVI distally. Negative logroll. Negative fabers, piriformis.   Assessment & Plan:  1. Low back pain with radiation into left leg - known  facet arthropathy and spinal stenosis.  Had not heard from imaging about injection but she's much better now.  Advised to restart home exercises if pain starts to recur and call us if it becomes severe.  F/u prn.

## 2018-03-30 NOTE — Assessment & Plan Note (Signed)
known facet arthropathy and spinal stenosis.  Had not heard from imaging about injection but she's much better now.  Advised to restart home exercises if pain starts to recur and call us if it becomes severe.  F/u prn.

## 2018-04-02 NOTE — Progress Notes (Signed)
Subjective:   Belinda Anderson is a 84 y.o. female who presents for Medicare Annual (Subsequent) preventive examination.  Review of Systems: No ROS.  Medicare Wellness Visit. Additional risk factors are reflected in the social history.    Sleep patterns: .Sleeps well 8 hrs.  Home Safety/Smoke Alarms: Feels safe in home. Smoke alarms in place.  Living environment; residence and Firearm Safety: Lives alone. 1 story. Grab rail in tub.   Female:        Mammo-  decline     Dexa scan-  declines     CCS-No loner doing routine screening due to age.     Objective:     Vitals: BP (!) 143/66 (BP Location: Right Arm, Patient Position: Sitting, Cuff Size: Normal) Comment: all vitals done by S.Richardson CMA  Pulse 72   Ht 5\' 6"  (1.676 m)   Wt 147 lb (66.7 kg)   SpO2 99%   BMI 23.73 kg/m   Body mass index is 23.73 kg/m.  Advanced Directives 04/06/2018 07/10/2013  Does Patient Have a Medical Advance Directive? Yes -  Type of Estate agentAdvance Directive Healthcare Power of [place];Living will -  Copy of Healthcare Power of Attorney in Chart? Yes -  Pre-existing out of facility DNR order (yellow form or pink MOST form) - No    Tobacco Social History   Tobacco Use  Smoking Status Never Smoker  Smokeless Tobacco Never Used     Counseling given: Not Answered   Clinical Intake: Pain Score: 0-No pain      Past Medical History:  Diagnosis Date  . Arthritis   . Hypertension   . Thyroid disease    Past Surgical History:  Procedure Laterality Date  . EYE SURGERY     lens implant   Family History  Problem Relation Age of Onset  . Arthritis Mother   . Heart disease Mother   . Heart disease Father [id number]       MI  . Coronary artery disease Unknown   . Diabetes Brother        DI   Social History   Socioeconomic History  . Marital status: Widowed    Spouse name: Not on file  . Number of children: Not on file  . Years of education: Not on file  . Highest education level: Not on  file  Occupational History  . Not on file  Social Needs  . Financial resource strain: Not on file  . Food insecurity:    Worry: Not on file    Inability: Not on file  . Transportation needs:    Medical: Not on file    Non-medical: Not on file  Tobacco Use  . Smoking status: Never Smoker  . Smokeless tobacco: Never Used  Substance and Sexual Activity  . Alcohol use: No  . Drug use: No  . Sexual activity: Not Currently    Partners: Male  Lifestyle  . Physical activity:    Days per week: Not on file    Minutes per session: Not on file  . Stress: Not on file  Relationships  . Social connections:    Talks on phone: Not on file    Gets together: Not on file    Attends religious service: Not on file    Active member of club or organization: Not on file    Attends meetings of clubs or organizations: Not on file    Relationship status: Not on file  Other Topics Concern  . 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 Acetaminophen (TYLENOL ARTHRITIS PAIN PO) Take 1 tablet by mouth every 6 (six) hours as needed (pain).   Marland Kitchen amLODipine (NORVASC) 2.5 MG tablet TAKE 1 TABLET BY MOUTH ONCE DAILY  . aspirin EC 325 MG EC tablet Take 325 mg by mouth daily.    . hydrochlorothiazide (HYDRODIURIL) 25 MG tablet Take 1 tablet (25 mg total) by mouth daily.  Marland Kitchen levothyroxine (SYNTHROID, LEVOTHROID) 88 MCG tablet Take 1 tablet (88 mcg total) by mouth daily.  . NONFORMULARY OR COMPOUNDED ITEM Compression stockings 20-30 mm hg  #1  Dx edema  . potassium chloride SA (K-DUR,KLOR-CON) 20 MEQ tablet take 1 tablet by mouth once daily  . pravastatin (PRAVACHOL) 20 MG tablet Take 1 tablet (20 mg total) by mouth daily.  . fluticasone (FLONASE) 50 MCG/ACT nasal spray Place 2 sprays into both nostrils daily. (Patient not taking: Reported on 04/06/2018)  . [DISCONTINUED]  amLODipine (NORVASC) 2.5 MG tablet TAKE 1 TABLET BY MOUTH ONCE DAILY  . [DISCONTINUED] benzonatate (TESSALON) 100 MG capsule Take 1 capsule (100 mg total) by mouth 3 (three) times daily as needed for cough.  . [DISCONTINUED] hydrochlorothiazide (HYDRODIURIL) 25 MG tablet Take 1 tablet (25 mg total) by mouth daily.  . [DISCONTINUED] oxyCODONE-acetaminophen (PERCOCET/ROXICET) 5-325 MG tablet Take 1 tablet by mouth every 6 (six) hours as needed for severe pain.  . [DISCONTINUED] predniSONE (DELTASONE) 10 MG tablet 6 tabs po day 1, 5 tabs po day 2, 4 tabs po day 3, 3 tabs po day 4, 2 tabs po day 5, 1 tab po day 6   No facility-administered encounter medications on file as of 04/06/2018.     Activities of Daily Living In your present state of health, do you have any difficulty performing the following activities: 04/06/2018 09/18/2017  Hearing? N N  Vision? N N  Comment wears reading glasses. -  Difficulty concentrating or making decisions? N Y  Walking or climbing stairs? N Y  Dressing or bathing? N N  Doing errands, shopping? N Y  Quarry manager and eating ? N -  Using the Toilet? N -  In the past six months, have you accidently leaked urine? N -  Do you have problems with loss of bowel control? N -  Managing your Medications? N -  Managing your Finances? N -  Housekeeping or managing your Housekeeping? N -  Some recent data might be hidden    Patient Care Team: [name], Grayling Congress, DO as PCP - General    Assessment:   This is a routine wellness examination for [place]. Physical assessment deferred to PCP.  Exercise Activities and Dietary recommendations   Diet (meal preparation, eat out, water intake, caffeinated beverages, dairy products, fruits and vegetables): well balanced  Goals    . Maintain independence (pt-stated)       Fall Risk Fall Risk  04/06/2018 06/20/2017 12/29/2015 12/16/2014 02/18/2013  Falls in the past year? No No No No No     Depression Screen PHQ 2/9  Scores 04/06/2018 06/20/2017 12/29/2015 12/16/2014  PHQ - 2 Score 0 0 0 0     Cognitive Function MMSE - Mini Mental State Exam 04/06/2018  Orientation to time 5  Orientation to Place 5  Registration 3  Attention/ Calculation 5  Recall 0  Language- name 2 objects 2  Language- repeat 1  Language- follow 3 step command 3  Language- read & follow direction 1  Write a sentence 1  Copy design 1  Total score 27        Immunization History  Administered Date(s) Administered  . Influenza Whole 08/06/2008, 07/20/2009, 07/06/2010  . Influenza, High Dose Seasonal PF 06/20/2017  . Pneumococcal Conjugate-13 03/15/2016  . Pneumococcal Polysaccharide-23 07/31/2000   Screening Tests Health Maintenance  Topic Date Due  . DEXA SCAN  05/13/1999  . MAMMOGRAM  03/28/2013  . TETANUS/TDAP  04/07/2019 (Originally 05/12/1951)  . INFLUENZA VACCINE  05/17/2018  . PNA vac Low Risk Adult  Completed      Plan:     Please schedule your next medicare wellness visit with me in 1 yr.  Continue to eat heart healthy diet (full of fruits, vegetables, whole grains, lean protein, water--limit salt, fat, and sugar intake) and increase physical activity as tolerated.  Continue doing brain stimulating activities (puzzles, reading, adult coloring books, staying active) to keep memory sharp.     I have personally reviewed and noted the following in the patient's chart:   . Medical and social history . Use of alcohol, tobacco or illicit drugs  . Current medications and supplements . Functional ability and status . Nutritional status . Physical activity . Advanced directives . List of other physicians . Hospitalizations, surgeries, and ER visits in previous 12 months . Vitals . Screenings to include cognitive, depression, and falls . Referrals and appointments  In addition, I have reviewed and discussed with patient certain preventive protocols, quality metrics, and best practice recommendations. A written  personalized care plan for preventive services as well as general preventive health recommendations were provided to patient.     [place]on [place], [place]  04/06/2018

## 2018-04-06 ENCOUNTER — Ambulatory Visit: Payer: Medicare Other | Admitting: Family Medicine

## 2018-04-06 ENCOUNTER — Encounter: Payer: Self-pay | Admitting: Family Medicine

## 2018-04-06 ENCOUNTER — Ambulatory Visit (INDEPENDENT_AMBULATORY_CARE_PROVIDER_SITE_OTHER): Payer: Medicare Other | Admitting: *Deleted

## 2018-04-06 ENCOUNTER — Ambulatory Visit: Payer: Medicare Other

## 2018-04-06 ENCOUNTER — Encounter: Payer: Self-pay | Admitting: *Deleted

## 2018-04-06 VITALS — BP 142/47 | HR 64 | Temp 97.8°F | Resp 16 | Ht 66.0 in | Wt 147.4 lb

## 2018-04-06 VITALS — BP 143/66 | HR 72 | Ht 66.0 in | Wt 147.0 lb

## 2018-04-06 DIAGNOSIS — E782 Mixed hyperlipidemia: Secondary | ICD-10-CM

## 2018-04-06 DIAGNOSIS — Z0000 Encounter for general adult medical examination without abnormal findings: Secondary | ICD-10-CM | POA: Diagnosis not present

## 2018-04-06 DIAGNOSIS — E039 Hypothyroidism, unspecified: Secondary | ICD-10-CM

## 2018-04-06 DIAGNOSIS — I10 Essential (primary) hypertension: Secondary | ICD-10-CM | POA: Diagnosis not present

## 2018-04-06 LAB — COMPREHENSIVE METABOLIC PANEL
ALT: 8 U/L (ref 0–35)
AST: 12 U/L (ref 0–37)
Albumin: 4.1 g/dL (ref 3.5–5.2)
Alkaline Phosphatase: 86 U/L (ref 39–117)
BILIRUBIN TOTAL: 0.4 mg/dL (ref 0.2–1.2)
BUN: 23 mg/dL (ref 6–23)
CALCIUM: 9.4 mg/dL (ref 8.4–10.5)
CO2: 32 meq/L (ref 19–32)
CREATININE: 1.35 mg/dL — AB (ref 0.40–1.20)
Chloride: 103 mEq/L (ref 96–112)
GFR: 39.72 mL/min — ABNORMAL LOW (ref >60.00)
Glucose, Bld: 84 mg/dL (ref 70–99)
Potassium: 3.7 mEq/L (ref 3.5–5.1)
Sodium: 142 mEq/L (ref 135–145)
TOTAL PROTEIN: 6.3 g/dL (ref 6.0–8.3)

## 2018-04-06 LAB — LIPID PANEL
Cholesterol: 174 mg/dL (ref 0–200)
HDL: 45.3 mg/dL (ref >39.00)
NONHDL: 129.01
TRIGLYCERIDES: 215 mg/dL — AB (ref 0.0–149.0)
Total CHOL/HDL Ratio: 4
VLDL: 43 mg/dL — ABNORMAL HIGH (ref 0.0–40.0)

## 2018-04-06 LAB — LDL CHOLESTEROL, DIRECT: Direct LDL: 97 mg/dL

## 2018-04-06 LAB — TSH: TSH: 0.24 u[IU]/mL — AB (ref 0.35–4.50)

## 2018-04-06 MED ORDER — AMLODIPINE BESYLATE 2.5 MG PO TABS: ORAL_TABLET | ORAL | 1 refills | Status: DC

## 2018-04-06 MED ORDER — HYDROCHLOROTHIAZIDE 25 MG PO TABS: 25.0000 mg | ORAL_TABLET | Freq: Every day | ORAL | 3 refills | Status: DC

## 2018-04-06 NOTE — Patient Instructions (Signed)
Please schedule your next medicare wellness visit with me in 1 yr.  Continue to eat heart healthy diet (full of fruits, vegetables, whole grains, lean protein, water--limit salt, fat, and sugar intake) and increase physical activity as tolerated.  Continue doing brain stimulating activities (puzzles, reading, adult coloring books, staying active) to keep memory sharp.    Belinda Anderson , Thank you for taking time to come for your Medicare Wellness Visit. I appreciate your ongoing commitment to your health goals. Please review the following plan we discussed and let me know if I can assist you in the future.   These are the goals we discussed: Goals    . Maintain independence (pt-stated)       This is a list of the screening recommended for you and due dates:  Health Maintenance  Topic Date Due  . DEXA scan (bone density measurement)  05/13/1999  . Mammogram  03/28/2013  . Tetanus Vaccine  04/07/2019*  . Flu Shot  05/17/2018  . Pneumonia vaccines  Completed  *Topic was postponed. The date shown is not the original due date.    Health Maintenance for Postmenopausal Women Menopause is a normal process in which your reproductive ability comes to an end. This process happens gradually over a span of months to years, usually between the ages of 64 and 55. Menopause is complete when you have missed 12 consecutive menstrual periods. It is important to talk with your health care provider about some of the most common conditions that affect postmenopausal women, such as heart disease, cancer, and bone loss (osteoporosis). Adopting a healthy lifestyle and getting preventive care can help to promote your health and wellness. Those actions can also lower your chances of developing some of these common conditions. What should I know about menopause? During menopause, you may experience a number of symptoms, such as:  Moderate-to-severe hot flashes.  Night sweats.  Decrease in sex drive.  Mood  swings.  Headaches.  Tiredness.  Irritability.  Memory problems.  Insomnia.  Choosing to treat or not to treat menopausal changes is an individual decision that you make with your health care provider. What should I know about hormone replacement therapy and supplements? Hormone therapy products are effective for treating symptoms that are associated with menopause, such as hot flashes and night sweats. Hormone replacement carries certain risks, especially as you become older. If you are thinking about using estrogen or estrogen with progestin treatments, discuss the benefits and risks with your health care provider. What should I know about heart disease and stroke? Heart disease, heart attack, and stroke become more likely as you age. This may be due, in part, to the hormonal changes that your body experiences during menopause. These can affect how your body processes dietary fats, triglycerides, and cholesterol. Heart attack and stroke are both medical emergencies. There are many things that you can do to help prevent heart disease and stroke:  Have your blood pressure checked at least every 1-2 years. High blood pressure causes heart disease and increases the risk of stroke.  If you are 36-71 years old, ask your health care provider if you should take aspirin to prevent a heart attack or a stroke.  Do not use any tobacco products, including cigarettes, chewing tobacco, or electronic cigarettes. If you need help quitting, ask your health care provider.  It is important to eat a healthy diet and maintain a healthy weight. ? Be sure to include plenty of vegetables, fruits, low-fat dairy products, and  lean protein. ? Avoid eating foods that are high in solid fats, added sugars, or salt (sodium).  Get regular exercise. This is one of the most important things that you can do for your health. ? Try to exercise for at least 150 minutes each week. The type of exercise that you do should  increase your heart rate and make you sweat. This is known as moderate-intensity exercise. ? Try to do strengthening exercises at least twice each week. Do these in addition to the moderate-intensity exercise.  Know your numbers.Ask your health care provider to check your cholesterol and your blood glucose. Continue to have your blood tested as directed by your health care provider.  What should I know about cancer screening? There are several types of cancer. Take the following steps to reduce your risk and to catch any cancer development as early as possible. Breast Cancer  Practice breast self-awareness. ? This means understanding how your breasts normally appear and feel. ? It also means doing regular breast self-exams. Let your health care provider know about any changes, no matter how small.  If you are 68 or older, have a clinician do a breast exam (clinical breast exam or CBE) every year. Depending on your age, family history, and medical history, it may be recommended that you also have a yearly breast X-ray (mammogram).  If you have a family history of breast cancer, talk with your health care provider about genetic screening.  If you are at high risk for breast cancer, talk with your health care provider about having an MRI and a mammogram every year.  Breast cancer (BRCA) gene test is recommended for women who have family members with BRCA-related cancers. Results of the assessment will determine the need for genetic counseling and BRCA1 and for BRCA2 testing. BRCA-related cancers include these types: ? Breast. This occurs in males or females. ? Ovarian. ? Tubal. This may also be called fallopian tube cancer. ? Cancer of the abdominal or pelvic lining (peritoneal cancer). ? Prostate. ? Pancreatic.  Cervical, Uterine, and Ovarian Cancer Your health care provider may recommend that you be screened regularly for cancer of the pelvic organs. These include your ovaries, uterus,  and vagina. This screening involves a pelvic exam, which includes checking for microscopic changes to the surface of your cervix (Pap test).  For women ages 21-65, health care providers may recommend a pelvic exam and a Pap test every three years. For women ages 45-65, they may recommend the Pap test and pelvic exam, combined with testing for human papilloma virus (HPV), every five years. Some types of HPV increase your risk of cervical cancer. Testing for HPV may also be done on women of any age who have unclear Pap test results.  Other health care providers may not recommend any screening for nonpregnant women who are considered low risk for pelvic cancer and have no symptoms. Ask your health care provider if a screening pelvic exam is right for you.  If you have had past treatment for cervical cancer or a condition that could lead to cancer, you need Pap tests and screening for cancer for at least 20 years after your treatment. If Pap tests have been discontinued for you, your risk factors (such as having a new sexual partner) need to be reassessed to determine if you should start having screenings again. Some women have medical problems that increase the chance of getting cervical cancer. In these cases, your health care provider may recommend that you have  screening and Pap tests more often.  If you have a family history of uterine cancer or ovarian cancer, talk with your health care provider about genetic screening.  If you have vaginal bleeding after reaching menopause, tell your health care provider.  There are currently no reliable tests available to screen for ovarian cancer.  Lung Cancer Lung cancer screening is recommended for adults 34-28 years old who are at high risk for lung cancer because of a history of smoking. A yearly low-dose CT scan of the lungs is recommended if you:  Currently smoke.  Have a history of at least 30 pack-years of smoking and you currently smoke or have quit  within the past 15 years. A pack-year is smoking an average of one pack of cigarettes per day for one year.  Yearly screening should:  Continue until it has been 15 years since you quit.  Stop if you develop a health problem that would prevent you from having lung cancer treatment.  Colorectal Cancer  This type of cancer can be detected and can often be prevented.  Routine colorectal cancer screening usually begins at age 55 and continues through age 70.  If you have risk factors for colon cancer, your health care provider may recommend that you be screened at an earlier age.  If you have a family history of colorectal cancer, talk with your health care provider about genetic screening.  Your health care provider may also recommend using home test kits to check for hidden blood in your stool.  A small camera at the end of a tube can be used to examine your colon directly (sigmoidoscopy or colonoscopy). This is done to check for the earliest forms of colorectal cancer.  Direct examination of the colon should be repeated every 5-10 years until age 27. However, if early forms of precancerous polyps or small growths are found or if you have a family history or genetic risk for colorectal cancer, you may need to be screened more often.  Skin Cancer  Check your skin from head to toe regularly.  Monitor any moles. Be sure to tell your health care provider: ? About any new moles or changes in moles, especially if there is a change in a mole's shape or color. ? If you have a mole that is larger than the size of a pencil eraser.  If any of your family members has a history of skin cancer, especially at a young age, talk with your health care provider about genetic screening.  Always use sunscreen. Apply sunscreen liberally and repeatedly throughout the day.  Whenever you are outside, protect yourself by wearing long sleeves, pants, a wide-brimmed hat, and sunglasses.  What should I know  about osteoporosis? Osteoporosis is a condition in which bone destruction happens more quickly than new bone creation. After menopause, you may be at an increased risk for osteoporosis. To help prevent osteoporosis or the bone fractures that can happen because of osteoporosis, the following is recommended:  If you are 31-59 years old, get at least 1,000 mg of calcium and at least 600 mg of vitamin D per day.  If you are older than age 46 but younger than age 50, get at least 1,200 mg of calcium and at least 600 mg of vitamin D per day.  If you are older than age 78, get at least 1,200 mg of calcium and at least 800 mg of vitamin D per day.  Smoking and excessive alcohol intake increase the risk  of osteoporosis. Eat foods that are rich in calcium and vitamin D, and do weight-bearing exercises several times each week as directed by your health care provider. What should I know about how menopause affects my mental health? Depression may occur at any age, but it is more common as you become older. Common symptoms of depression include:  Low or sad mood.  Changes in sleep patterns.  Changes in appetite or eating patterns.  Feeling an overall lack of motivation or enjoyment of activities that you previously enjoyed.  Frequent crying spells.  Talk with your health care provider if you think that you are experiencing depression. What should I know about immunizations? It is important that you get and maintain your immunizations. These include:  Tetanus, diphtheria, and pertussis (Tdap) booster vaccine.  Influenza every year before the flu season begins.  Pneumonia vaccine.  Shingles vaccine.  Your health care provider may also recommend other immunizations. This information is not intended to replace advice given to you by your health care provider. Make sure you discuss any questions you have with your health care provider. Document Released: 11/25/2005 Document Revised: 04/22/2016  Document Reviewed: 07/07/2015 Elsevier Interactive Patient Education  2018 [employer].

## 2018-04-06 NOTE — Progress Notes (Signed)
Reviewed  [name], DO  

## 2018-04-06 NOTE — Patient Instructions (Signed)

## 2018-04-06 NOTE — Assessment & Plan Note (Signed)
Well controlled, no changes to meds. Encouraged heart healthy diet such as the DASH diet and exercise as tolerated.  °

## 2018-04-06 NOTE — Progress Notes (Signed)
Patient ID: Belinda Anderson, female   DOB: 01/01/1932, 84 y.o.   MRN: [id number]    Subjective:  I acted as a Neurosurgeon for Dr. [name].  Apolonio Schneiders, CMA   Patient ID: Belinda Anderson, female    DOB: 03/09/1932, 84 y.o.   MRN: [id number]  Chief Complaint  Patient presents with  . Hyperlipidemia    HPI  Patient is in today for follow up cholesterol , bp-- pt is doing better with her back/ legs.  She is walking without the cane now.   She also needs refills and labs.  No new complaints.    Patient Care Team: [name], Grayling Congress, DO as PCP - General   Past Medical History:  Diagnosis Date  . Arthritis   . Hypertension   . Thyroid disease     Past Surgical History:  Procedure Laterality Date  . EYE SURGERY     lens implant    Family History  Problem Relation Age of Onset  . Arthritis Mother   . Heart disease Mother   . Heart disease Father 41       MI  . Coronary artery disease Unknown   . Diabetes Brother        DI    Social History   Socioeconomic History  . Marital status: Widowed    Spouse name: Not on file  . Number of children: Not on file  . Years of education: Not on file  . Highest education level: Not on file  Occupational History  . Not on file  Social Needs  . Financial resource strain: Not on file  . Food insecurity:    Worry: Not on file    Inability: Not on file  . Transportation needs:    Medical: Not on file    Non-medical: Not on file  Tobacco Use  . Smoking status: Never Smoker  . Smokeless tobacco: Never Used  Substance and Sexual Activity  . Alcohol use: No  . Drug use: No  . Sexual activity: Not Currently    Partners: Male  Lifestyle  . Physical activity:    Days per week: Not on file    Minutes per session: Not on file  . Stress: Not on file  Relationships  . Social connections:    Talks on phone: Not on file    Gets together: Not on file    Attends religious service: Not on file    Active member of club or organization:  Not on file    Attends meetings of clubs or organizations: Not on file    Relationship status: Not on file  . Intimate partner violence:    Fear of current or ex partner: Not on file    Emotionally abused: Not on file    Physically abused: Not on file    Forced sexual activity: Not on file  Other Topics Concern  . 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 Acetaminophen (TYLENOL ARTHRITIS PAIN PO) Take 1 tablet by mouth every 6 (six) hours as needed (pain).     Marland Kitchen aspirin EC 325 MG EC tablet Take 325 mg by mouth daily.      . fluticasone (FLONASE) 50 MCG/ACT nasal spray Place 2 sprays into both nostrils daily. 16 g 1  . levothyroxine (SYNTHROID, LEVOTHROID) 88 MCG tablet Take 1 tablet (88 mcg  total) by mouth daily. 90 tablet 3  . NONFORMULARY OR COMPOUNDED ITEM Compression stockings 20-30 mm hg  #1  Dx edema 1 each 0  . potassium chloride SA (K-DUR,KLOR-CON) 20 MEQ tablet take 1 tablet by mouth once daily 30 tablet 5  . pravastatin (PRAVACHOL) 20 MG tablet Take 1 tablet (20 mg total) by mouth daily. 90 tablet 1  . amLODipine (NORVASC) 2.5 MG tablet TAKE 1 TABLET BY MOUTH ONCE DAILY 90 tablet 0  . benzonatate (TESSALON) 100 MG capsule Take 1 capsule (100 mg total) by mouth 3 (three) times daily as needed for cough. 20 capsule 0  . hydrochlorothiazide (HYDRODIURIL) 25 MG tablet Take 1 tablet (25 mg total) by mouth daily. 90 tablet 3  . oxyCODONE-acetaminophen (PERCOCET/ROXICET) 5-325 MG tablet Take 1 tablet by mouth every 6 (six) hours as needed for severe pain. 20 tablet 0  . predniSONE (DELTASONE) 10 MG tablet 6 tabs po day 1, 5 tabs po day 2, 4 tabs po day 3, 3 tabs po day 4, 2 tabs po day 5, 1 tab po day 6 21 tablet 0   No facility-administered medications prior to visit.     No Known Allergies  Review of Systems  Constitutional: Negative for  fever and malaise/fatigue.  HENT: Negative for congestion.   Eyes: Negative for blurred vision.  Respiratory: Negative for cough and shortness of breath.   Cardiovascular: Negative for chest pain, palpitations and leg swelling.  Gastrointestinal: Negative for vomiting.  Musculoskeletal: Negative for back pain.  Skin: Negative for rash.  Neurological: Negative for loss of consciousness and headaches.       Objective:    Physical Exam  Constitutional: She is oriented to person, place, and time. She appears well-developed and well-nourished.  HENT:  Head: Normocephalic and atraumatic.  Eyes: Conjunctivae and EOM are normal.  Neck: Normal range of motion. Neck supple. No JVD present. Carotid bruit is not present. No thyromegaly present.  Cardiovascular: Normal rate, regular rhythm and normal heart sounds.  No murmur heard. Pulmonary/Chest: Effort normal and breath sounds normal. No respiratory distress. She has no wheezes. She has no rales. She exhibits no tenderness.  Musculoskeletal: She exhibits no edema.  Lymphadenopathy:    She has no cervical adenopathy.  Neurological: She is alert and oriented to person, place, and time.  Psychiatric: She has a normal mood and affect.  Nursing note and vitals reviewed.   BP (!) 142/47   Pulse 64   Temp 97.8 F (36.6 C) (Oral)   Resp 16   Ht 5\' 6"  (1.676 m)   Wt 147 lb 6.4 oz (66.9 kg)   SpO2 99%   BMI 23.79 kg/m  Wt Readings from Last 3 Encounters:  04/06/18 147 lb 6.4 oz (66.9 kg)  03/29/18 140 lb (63.5 kg)  03/01/18 145 lb (65.8 kg)   BP Readings from Last 3 Encounters:  04/06/18 (!) 142/47  03/29/18 131/64  03/01/18 128/66     Immunization History  Administered Date(s) Administered  . Influenza Whole 08/06/2008, 07/20/2009, 07/06/2010  . Influenza, High Dose Seasonal PF 06/20/2017  . Pneumococcal Conjugate-13 03/15/2016  . Pneumococcal Polysaccharide-23 07/31/2000    Health Maintenance  Topic Date Due  . DEXA SCAN   05/13/1999  . MAMMOGRAM  03/28/2013  . TETANUS/TDAP  04/07/2019 (Originally 05/12/1951)  . INFLUENZA VACCINE  05/17/2018  . PNA vac Low Risk Adult  Completed    Lab Results  Component Value Date   WBC 6.9 02/22/2018   HGB 12.6  02/22/2018   HCT 38.4 02/22/2018   PLT 234 02/22/2018   GLUCOSE 116 (H) 02/22/2018   CHOL 173 09/18/2017   TRIG 201.0 (H) 09/18/2017   HDL 46.80 09/18/2017   LDLDIRECT 96.0 09/18/2017   LDLCALC 91 06/20/2017   ALT 12 (L) 02/22/2018   AST 18 02/22/2018   NA 139 02/22/2018   K 3.2 (L) 02/22/2018   CL 104 02/22/2018   CREATININE 1.56 (H) 02/22/2018   BUN 29 (H) 02/22/2018   CO2 24 02/22/2018   TSH 1.17 09/18/2017   INR 1.03 07/10/2013    Lab Results  Component Value Date   TSH 1.17 09/18/2017   Lab Results  Component Value Date   WBC 6.9 02/22/2018   HGB 12.6 02/22/2018   HCT 38.4 02/22/2018   MCV 97.0 02/22/2018   PLT 234 02/22/2018   Lab Results  Component Value Date   NA 139 02/22/2018   K 3.2 (L) 02/22/2018   CO2 24 02/22/2018   GLUCOSE 116 (H) 02/22/2018   BUN 29 (H) 02/22/2018   CREATININE 1.56 (H) 02/22/2018   BILITOT 0.6 02/22/2018   ALKPHOS 90 02/22/2018   AST 18 02/22/2018   ALT 12 (L) 02/22/2018   PROT 6.8 02/22/2018   ALBUMIN 4.0 02/22/2018   CALCIUM 9.4 02/22/2018   ANIONGAP 11 02/22/2018   GFR 38.45 (L) 09/18/2017   Lab Results  Component Value Date   CHOL 173 09/18/2017   Lab Results  Component Value Date   HDL 46.80 09/18/2017   Lab Results  Component Value Date   LDLCALC 91 06/20/2017   Lab Results  Component Value Date   TRIG 201.0 (H) 09/18/2017   Lab Results  Component Value Date   CHOLHDL 4 09/18/2017   No results found for: HGBA1C       Assessment & Plan:   Problem List Items Addressed This Visit      Unprioritized   Essential hypertension - Primary   Relevant Medications   hydrochlorothiazide (HYDRODIURIL) 25 MG tablet   amLODipine (NORVASC) 2.5 MG tablet   Other Relevant Orders    Comprehensive metabolic panel   Hyperlipidemia    Tolerating statin, encouraged heart healthy diet, avoid trans fats, minimize simple carbs and saturated fats. Increase exercise as tolerated      Relevant Medications   hydrochlorothiazide (HYDRODIURIL) 25 MG tablet   amLODipine (NORVASC) 2.5 MG tablet   Other Relevant Orders   Comprehensive metabolic panel   Lipid panel   HYPERTENSION, BENIGN    Well controlled, no changes to meds. Encouraged heart healthy diet such as the DASH diet and exercise as tolerated.       Relevant Medications   hydrochlorothiazide (HYDRODIURIL) 25 MG tablet   amLODipine (NORVASC) 2.5 MG tablet   Hypothyroidism    Check labs con't synthroid      Relevant Orders   TSH      I have discontinued Lanora Manis Geralds's predniSONE and oxyCODONE-acetaminophen. I am also having her maintain her aspirin EC, NONFORMULARY OR COMPOUNDED ITEM, fluticasone, Acetaminophen (TYLENOL ARTHRITIS PAIN PO), levothyroxine, potassium chloride SA, pravastatin, hydrochlorothiazide, and amLODipine.  Meds ordered this encounter  Medications  . hydrochlorothiazide (HYDRODIURIL) 25 MG tablet    Sig: Take 1 tablet (25 mg total) by mouth daily.    Dispense:  90 tablet    Refill:  3  . amLODipine (NORVASC) 2.5 MG tablet    Sig: TAKE 1 TABLET BY MOUTH ONCE DAILY    Dispense:  90 tablet  Refill:  1    CMA served as Neurosurgeonscribe during this visit. History, Physical and Plan performed by medical provider. Documentation and orders reviewed and attested to.  Donato SchultzYvonne R Lowne Chase, DO

## 2018-04-06 NOTE — Assessment & Plan Note (Signed)
Check labs con't synthroid 

## 2018-04-06 NOTE — Assessment & Plan Note (Signed)
Tolerating statin, encouraged heart healthy diet, avoid trans fats, minimize simple carbs and saturated fats. Increase exercise as tolerated 

## 2018-04-12 MED ORDER — PRAVASTATIN SODIUM 40 MG PO TABS: 40.0000 mg | ORAL_TABLET | Freq: Every day | ORAL | 2 refills | Status: DC

## 2018-04-12 MED ORDER — LEVOTHYROXINE SODIUM 75 MCG PO TABS: 75.0000 ug | ORAL_TABLET | Freq: Every day | ORAL | 2 refills | Status: DC

## 2018-04-12 NOTE — Addendum Note (Signed)
Addended by: Steve RattlerBLEVINS, BAILEY A on: 04/12/2018 10:28 AM   Modules accepted: Orders

## 2018-04-18 ENCOUNTER — Telehealth: Payer: Self-pay

## 2018-04-18 DIAGNOSIS — E039 Hypothyroidism, unspecified: Secondary | ICD-10-CM

## 2018-04-18 DIAGNOSIS — I607 Nontraumatic subarachnoid hemorrhage from unspecified intracranial artery: Secondary | ICD-10-CM

## 2018-04-18 DIAGNOSIS — Z0000 Encounter for general adult medical examination without abnormal findings: Secondary | ICD-10-CM

## 2018-04-18 DIAGNOSIS — E785 Hyperlipidemia, unspecified: Secondary | ICD-10-CM

## 2018-04-18 NOTE — Telephone Encounter (Signed)
[facility] phoned author re: pt. Wanting to schedule lab appointment per Dr. Ernst SpellLowne's request to recheck labs in 2 months per lab results notes. Author placed orders for CMP,TSH, and lipid profile per Dr. Laury AxonLowne.

## 2018-06-25 ENCOUNTER — Other Ambulatory Visit (INDEPENDENT_AMBULATORY_CARE_PROVIDER_SITE_OTHER): Payer: Medicare Other

## 2018-06-25 DIAGNOSIS — E039 Hypothyroidism, unspecified: Secondary | ICD-10-CM

## 2018-06-25 DIAGNOSIS — E785 Hyperlipidemia, unspecified: Secondary | ICD-10-CM

## 2018-06-25 DIAGNOSIS — Z0000 Encounter for general adult medical examination without abnormal findings: Secondary | ICD-10-CM

## 2018-06-25 LAB — LIPID PANEL
CHOL/HDL RATIO: 4
Cholesterol: 168 mg/dL (ref 0–200)
HDL: 42.9 mg/dL (ref >39.00)
LDL Cholesterol: 90 mg/dL (ref 0–99)
NonHDL: 124.98
Triglycerides: 174 mg/dL — ABNORMAL HIGH (ref 0.0–149.0)
VLDL: 34.8 mg/dL (ref 0.0–40.0)

## 2018-06-25 LAB — COMPREHENSIVE METABOLIC PANEL
ALT: 8 U/L (ref 0–35)
AST: 12 U/L (ref 0–37)
Albumin: 4 g/dL (ref 3.5–5.2)
Alkaline Phosphatase: 100 U/L (ref 39–117)
BUN: 18 mg/dL (ref 6–23)
CHLORIDE: 102 meq/L (ref 96–112)
CO2: 32 meq/L (ref 19–32)
CREATININE: 1.46 mg/dL — AB (ref 0.40–1.20)
Calcium: 9.5 mg/dL (ref 8.4–10.5)
GFR: 36.27 mL/min — ABNORMAL LOW (ref >60.00)
Glucose, Bld: 96 mg/dL (ref 70–99)
Potassium: 3.5 mEq/L (ref 3.5–5.1)
SODIUM: 142 meq/L (ref 135–145)
Total Bilirubin: 0.5 mg/dL (ref 0.2–1.2)
Total Protein: 6.2 g/dL (ref 6.0–8.3)

## 2018-06-25 LAB — TSH: TSH: 1.12 u[IU]/mL (ref 0.35–4.50)

## 2018-07-17 ENCOUNTER — Ambulatory Visit (INDEPENDENT_AMBULATORY_CARE_PROVIDER_SITE_OTHER): Payer: Medicare Other | Admitting: Family Medicine

## 2018-07-17 ENCOUNTER — Encounter: Payer: Self-pay | Admitting: Family Medicine

## 2018-07-17 ENCOUNTER — Telehealth: Payer: Self-pay | Admitting: *Deleted

## 2018-07-17 ENCOUNTER — Ambulatory Visit (HOSPITAL_BASED_OUTPATIENT_CLINIC_OR_DEPARTMENT_OTHER)
Admission: RE | Admit: 2018-07-17 | Discharge: 2018-07-17 | Disposition: A | Discharge status: Home / Self Care | Payer: Medicare Other | Source: Ambulatory Visit | Attending: Family Medicine | Admitting: Family Medicine

## 2018-07-17 VITALS — BP 132/60 | HR 75 | Temp 98.2°F | Resp 16 | Ht 66.0 in | Wt 156.6 lb

## 2018-07-17 DIAGNOSIS — M25561 Pain in right knee: Secondary | ICD-10-CM | POA: Diagnosis not present

## 2018-07-17 DIAGNOSIS — M79661 Pain in right lower leg: Secondary | ICD-10-CM

## 2018-07-17 DIAGNOSIS — I872 Venous insufficiency (chronic) (peripheral): Secondary | ICD-10-CM | POA: Insufficient documentation

## 2018-07-17 DIAGNOSIS — M7989 Other specified soft tissue disorders: Secondary | ICD-10-CM

## 2018-07-17 DIAGNOSIS — Z23 Encounter for immunization: Secondary | ICD-10-CM | POA: Diagnosis not present

## 2018-07-17 DIAGNOSIS — R936 Abnormal findings on diagnostic imaging of limbs: Secondary | ICD-10-CM | POA: Diagnosis not present

## 2018-07-17 MED ORDER — FUROSEMIDE 40 MG PO TABS: 40.0000 mg | ORAL_TABLET | Freq: Every day | ORAL | 3 refills | Status: DC

## 2018-07-17 NOTE — Assessment & Plan Note (Signed)
Elevate legs Change diuretic F/u 2 weeks

## 2018-07-17 NOTE — Progress Notes (Addendum)
Patient ID: Belinda Anderson, female    DOB: [birth year]  Age: 84 y.o. MRN: [id number]    Subjective:  Subjective  HPI Belinda Anderson presents for c/o swelling in R low leg and pain.  Symptoms x 2 weeks .    Review of Systems  Constitutional: Negative for activity change, appetite change, chills, diaphoresis, fatigue, fever and unexpected weight change.  Eyes: Negative for pain, redness and visual disturbance.  Respiratory: Negative for cough, chest tightness, shortness of breath and wheezing.   Cardiovascular: Positive for leg swelling. Negative for chest pain and palpitations.  Gastrointestinal: Negative for abdominal distention and abdominal pain.  Endocrine: Negative for cold intolerance, heat intolerance, polydipsia, polyphagia and polyuria.  Genitourinary: Negative for difficulty urinating, dyspareunia, dysuria, flank pain, frequency, genital sores, hematuria, menstrual problem, pelvic pain, urgency, vaginal discharge and vaginal pain.  Musculoskeletal: Positive for arthralgias. Negative for back pain.  Neurological: Negative for dizziness, light-headedness, numbness and headaches.    History Past Medical History:  Diagnosis Date  . Arthritis   . Hypertension   . Thyroid disease     She has a past surgical history that includes Eye surgery.   Her family history includes Arthritis in her mother; Coronary artery disease in her unknown relative; Diabetes in her brother; Heart disease in her mother; Heart disease (age of onset: 7) in her father.She reports that she has never smoked. She has never used smokeless tobacco. She reports that she does not drink alcohol or use drugs.  Current Outpatient Medications on File Prior to Visit  Medication Sig Dispense Refill  . Acetaminophen (TYLENOL ARTHRITIS PAIN PO) Take 2 tablets by mouth every 6 (six) hours as needed (pain).     Marland Kitchen amLODipine (NORVASC) 2.5 MG tablet TAKE 1 TABLET BY MOUTH ONCE DAILY 90 tablet 1  . aspirin EC 325 MG EC  tablet Take 325 mg by mouth daily.      Marland Kitchen levothyroxine (SYNTHROID, LEVOTHROID) 75 MCG tablet Take 1 tablet (75 mcg total) by mouth daily. 30 tablet 2  . NONFORMULARY OR COMPOUNDED ITEM Compression stockings 20-30 mm hg  #1  Dx edema 1 each 0  . pravastatin (PRAVACHOL) 40 MG tablet Take 1 tablet (40 mg total) by mouth daily. 30 tablet 2   No current facility-administered medications on file prior to visit.      Objective:  Objective  Physical Exam  Constitutional: She is oriented to person, place, and time. She appears well-developed and well-nourished.  HENT:  Head: Normocephalic and atraumatic.  Eyes: Conjunctivae and EOM are normal.  Neck: Normal range of motion. Neck supple. No JVD present. Carotid bruit is not present. No thyromegaly present.  Cardiovascular: Normal rate, regular rhythm and normal heart sounds.  No murmur heard. Pulmonary/Chest: Effort normal and breath sounds normal. No respiratory distress. She has no wheezes. She has no rales. She exhibits no tenderness.  Musculoskeletal: She exhibits edema and tenderness.  r calf pain and knee pain   Neurological: She is alert and oriented to person, place, and time.  Psychiatric: She has a normal mood and affect.  Nursing note and vitals reviewed.  BP 132/60 (BP Location: Left Arm, Cuff Size: Normal)   Pulse 75   Temp 98.2 F (36.8 C) (Oral)   Resp 16   Ht 5\' 6"  (1.676 m)   Wt 156 lb 9.6 oz (71 kg)   SpO2 97%   BMI 25.28 kg/m  Wt Readings from Last 3 Encounters:  07/31/18 152 lb 3.2 oz (69 kg)  07/17/18 156 lb 9.6 oz (71 kg)  04/06/18 147 lb (66.7 kg)     Lab Results  Component Value Date   WBC 6.9 02/22/2018   HGB 12.6 02/22/2018   HCT 38.4 02/22/2018   PLT 234 02/22/2018   GLUCOSE 96 06/25/2018   CHOL 168 06/25/2018   TRIG 174.0 (H) 06/25/2018   HDL 42.90 06/25/2018   LDLDIRECT 97.0 04/06/2018   LDLCALC 90 06/25/2018   ALT 8 06/25/2018   AST 12 06/25/2018   NA 142 06/25/2018   K 3.5 06/25/2018    CL 102 06/25/2018   CREATININE 1.46 (H) 06/25/2018   BUN 18 06/25/2018   CO2 32 06/25/2018   TSH 1.12 06/25/2018   INR 1.03 07/10/2013    Dg Lumbar Spine Complete  Result Date: 02/22/2018 CLINICAL DATA:  Low back pain radiating to the left hip for 2 weeks. EXAM: LUMBAR SPINE - COMPLETE 4+ VIEW COMPARISON:  MRI 12/31/2016 FINDINGS: There are 5 lumbar type vertebral bodies. No acute fracture or suspicious osseous lesions. Stable grade 1 anterolisthesis of L3 on L4. Marked disc flattening L4-5 with mild disc flattening L1 through L3 and L5-S1. Multilevel degenerative facet arthropathy with joint space narrowing, sclerosis and hypertrophy. IMPRESSION: Lumbar spondylosis without acute fracture. Chronic stable L3 on L4 grade 1 anterolisthesis. Electronically Signed   By: Tollie Eth M.D.   On: 02/22/2018 03:47   Ct Angio Low Extrem Left W &/or Wo Contrast  Result Date: 02/22/2018 CLINICAL DATA:  Left flank, hip pain radiating into the left leg. Concern for peripheral vascular disease EXAM: CT ANGIOGRAPHY LOWER LEFT EXTREMITY TECHNIQUE: Contrast enhanced CTA imaging performed of the left lower extremity. Coronal sagittal reformats performed to evaluate vascular anatomy. CONTRAST:  80mL ISOVUE-370 IOPAMIDOL (ISOVUE-370) INJECTION 76% COMPARISON:  02/22/2018 FINDINGS: Vascular: Inflow: Visualized left internal and external iliac arteries are patent. No iliac occlusion or inflow disease. Outflow: Left common femoral, profunda femoral, and SFA demonstrate atherosclerotic disease but no evidence significant stenosis, occlusion, dissection, or aneurysm. Runoff: Popliteal artery is patent across the knee. Anterior tibial and tibioperoneal trunk are both patent. There is faint opacification of the three-vessel peripheral runoff proximally. Nonvascular: Visualized pelvis demonstrates colonic diverticulosis. Uterus and adnexa normal in size. Bladder unremarkable. Fluid noted along the left hip greater trochanter  compatible with trochanteric bursitis. Degenerative changes of the visualized pelvis and hips. No acute osseous finding. No left lower extremity soft tissue abnormality, fluid collection, hematoma, or focal swelling. No acute osseous finding. Review of the MIP images confirms the above findings. IMPRESSION: Nonocclusive peripheral vascular disease. No acute vascular finding. Left hip trochanteric bursitis, appearing chronic. Electronically Signed   By: Judie Petit.  Shick M.D.   On: 02/22/2018 09:28   Ct Angio Chest/abd/pel For Dissection W And/or Wo Contrast  Result Date: 02/22/2018 CLINICAL DATA:  Acute upper abdominal pain radiating to the left flank and hip. Concern for dissection EXAM: CT ANGIOGRAPHY CHEST, ABDOMEN AND PELVIS TECHNIQUE: Multidetector CT imaging through the chest, abdomen and pelvis was performed using the standard protocol during bolus administration of intravenous contrast. Multiplanar reconstructed images and MIPs were obtained and reviewed to evaluate the vascular anatomy. CONTRAST:  80mL ISOVUE-370 IOPAMIDOL (ISOVUE-370) INJECTION 76% COMPARISON:  None. FINDINGS: CTA CHEST FINDINGS Cardiovascular: Initial noncontrast chest imaging demonstrates no hyperdense intramural hemorrhage or hematoma. Atherosclerosis of the aorta. Intact thoracic aorta. No aneurysm or dissection. Two vessel arch anatomy appearing patent. Central pulmonary arteries are patent. Normal heart size. Native coronary atherosclerosis present. No pericardial effusion. Mediastinum/Nodes: No enlarged  mediastinal, hilar, or axillary lymph nodes. Thyroid gland, trachea, and esophagus demonstrate no significant findings. Lungs/Pleura: Biapical pleuroparenchymal scarring. Bibasilar atelectasis and scarring. No focal pneumonia, collapse or consolidation. Negative for edema or interstitial process. No other pleural abnormality, pleural effusion, or pneumothorax. Trachea central airways are patent. No bronchiectasis or mucus plugging.  Musculoskeletal: Thoracic spondylosis noted. No acute compression fracture. Sternum intact. Review of the MIP images confirms the above findings. CTA ABDOMEN AND PELVIS FINDINGS VASCULAR Aorta: Aortoiliac atherosclerosis. Negative for aneurysm or dissection. No acute vascular finding. Negative for rupture or retroperitoneal hemorrhage. No occlusive disease. Celiac: Chronic appearing celiac origin occlusion/severe stenosis. Celiac branches remain patent, suspect via retrograde flow through the enlarged gastroduodenal artery and SMA collateral pathways. SMA: Widely patent including its branches Renals: Atherosclerotic origins with mild ostial narrowing but no significant renal artery stenosis or occlusion. No accessory renal vasculature IMA: Remains patent off the distal aorta including its branches Inflow: Atherosclerotic changes of the iliac vasculature. No inflow disease. Common, internal and external iliac arteries remain patent bilaterally. Veins: Venous imaging not performed. Review of the MIP images confirms the above findings. NON-VASCULAR Hepatobiliary: Remote cholecystectomy. No definite focal hepatic abnormality within the limits of arterial phase imaging. Diffuse biliary prominence, suspect postsurgical. Pancreas: Unremarkable. No pancreatic ductal dilatation or surrounding inflammatory changes. Spleen: Normal in size without focal abnormality. Adrenals/Urinary Tract: Adrenal glands are unremarkable. Kidneys are normal, without renal calculi, focal lesion, or hydronephrosis. Bladder is unremarkable. Stomach/Bowel: Negative for bowel obstruction, significant dilatation, ileus, or free air. No fluid collection or abscess. Normal appearing appendix. Rectosigmoid diverticulosis noted. No acute inflammatory process. Lymphatic: No adenopathy. Reproductive: Uterus and adnexa normal in size for age. No pelvic free fluid, fluid collection or hemorrhage. No abscess. Other: No abdominal wall hernia or abnormality. No  abdominopelvic ascites. Musculoskeletal: Degenerative spondylosis of the spine. Lower lumbar facet arthropathy. Multilevel vacuum disc phenomena. No acute osseous finding. Review of the MIP images confirms the above findings. IMPRESSION: Aortic atherosclerosis without aneurysm or dissection. No acute vascular process. 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  Shick M.D.   On: 02/22/2018 09:40   Dg Hip Unilat W Or W/o Pelvis 2-3 Views Left  Result Date: 02/22/2018 CLINICAL DATA:  Back pain radiating to the left hip for 2 weeks. EXAM: DG HIP (WITH OR WITHOUT PELVIS) 2-3V LEFT COMPARISON:  None. FINDINGS: Marked disc flattening of the included lumbar spine from L3 through S1 more so at L4-5 and L5-S1. Associated facet arthropathy is seen. Bony pelvis and hips are intact. Slight degenerative joint space narrowing of both hips. Both femoral heads are spherical in appearance without flattening or evidence of femoroacetabular impingement. IMPRESSION: There is degenerative disc and facet arthropathy of the included lower lumbar spine. No acute pelvic fracture or suspicious osseous lesions. Mild degenerative joint space narrowing of both hips. Electronically Signed   By: Tollie Eth M.D.   On: 02/22/2018 03:49     Assessment & Plan:  Plan  I have discontinued Belinda Anderson's fluticasone and hydrochlorothiazide. I am also having her start on furosemide. Additionally, I am having her maintain her aspirin EC, NONFORMULARY OR COMPOUNDED ITEM, Acetaminophen (TYLENOL ARTHRITIS PAIN PO), amLODipine, pravastatin, and levothyroxine.  Meds ordered this encounter  Medications  . furosemide (LASIX) 40 MG tablet    Sig: Take 1 tablet (40 mg total) by mouth daily.    Dispense:  30 tablet  Refill:  3    Problem List Items Addressed This Visit      Unprioritized   Knee  pain, acute    prob arthritis  Check xray due to swelling and pain  May need f/u sport med      Relevant Orders   DG Knee Complete 4 Views Right (Completed)   Pain of right calf    R/o dvt Doppler today      Relevant Orders   US Venous Img Lower Unilateral Right (Completed)   Right leg swelling    Elevate legs Change diuretic F/u 2 weeks        Relevant Medications   furosemide (LASIX) 40 MG tablet   Other Relevant Orders   US Venous Img Lower Unilateral Right (Completed)    Other Visit Diagnoses    Influenza vaccine administered    -  Primary   Relevant Orders   Flu vaccine HIGH DOSE PF (Fluzone High Dose) (Completed)      Follow-up: Return in about 2 weeks (around 07/31/2018), or if symptoms worsen or fail to improve, for edema.  [name], DO

## 2018-07-17 NOTE — Assessment & Plan Note (Signed)
R/o dvt Doppler today

## 2018-07-17 NOTE — Patient Instructions (Signed)
Edema Edema is an abnormal buildup of fluids in your bodytissues. Edema is somewhatdependent on gravity to pull the fluid to the lowest place in your body. That makes the condition more common in the legs and thighs (lower extremities). Painless swelling of the feet and ankles is common and becomes more likely as you get older. It is also common in looser tissues, like around your eyes. When the affected area is squeezed, the fluid may move out of that spot and leave a dent for a few moments. This dent is called pitting. What are the causes? There are many possible causes of edema. Eating too much salt and being on your feet or sitting for a long time can cause edema in your legs and ankles. Hot weather may make edema worse. Common medical causes of edema include:  Heart failure.  Liver disease.  Kidney disease.  Weak blood vessels in your legs.  Cancer.  An injury.  Pregnancy.  Some medications.  Obesity.  What are the signs or symptoms? Edema is usually painless.Your skin may look swollen or shiny. How is this diagnosed? Your health care provider may be able to diagnose edema by asking about your medical history and doing a physical exam. You may need to have tests such as X-rays, an electrocardiogram, or blood tests to check for medical conditions that may cause edema. How is this treated? Edema treatment depends on the cause. If you have heart, liver, or kidney disease, you need the treatment appropriate for these conditions. General treatment may include:  Elevation of the affected body part above the level of your heart.  Compression of the affected body part. Pressure from elastic bandages or support stockings squeezes the tissues and forces fluid back into the blood vessels. This keeps fluid from entering the tissues.  Restriction of fluid and salt intake.  Use of a water pill (diuretic). These medications are appropriate only for some types of edema. They pull fluid  out of your body and make you urinate more often. This gets rid of fluid and reduces swelling, but diuretics can have side effects. Only use diuretics as directed by your health care provider.  Follow these instructions at home:  Keep the affected body part above the level of your heart when you are lying down.  Do not sit still or stand for prolonged periods.  Do not put anything directly under your knees when lying down.  Do not wear constricting clothing or garters on your upper legs.  Exercise your legs to work the fluid back into your blood vessels. This may help the swelling go down.  Wear elastic bandages or support stockings to reduce ankle swelling as directed by your health care provider.  Eat a low-salt diet to reduce fluid if your health care provider recommends it.  Only take medicines as directed by your health care provider. Contact a health care provider if:  Your edema is not responding to treatment.  You have heart, liver, or kidney disease and notice symptoms of edema.  You have edema in your legs that does not improve after elevating them.  You have sudden and unexplained weight gain. Get help right away if:  You develop shortness of breath or chest pain.  You cannot breathe when you lie down.  You develop pain, redness, or warmth in the swollen areas.  You have heart, liver, or kidney disease and suddenly get edema.  You have a fever and your symptoms suddenly get worse. This information is   not intended to replace advice given to you by your health care provider. Make sure you discuss any questions you have with your health care provider. Document Released: 10/03/2005 Document Revised: 03/10/2016 Document Reviewed: 07/26/2013 Elsevier Interactive Patient Education  2017 [employer]  

## 2018-07-17 NOTE — Telephone Encounter (Signed)
'  Meesha at [facility] calling to report results of venous dopplers:  "Negative for DVT in the right lower extremity.  Suspect small right Baker's cyst  GSV venous insufficiency with varicosities."  Pt to stay at clinic until results reviewed by Dr. [name]. Called practice, results read to [facility] who alerted Dr. [name].  Message from Dr. Laury Axon given to pt by [facility].

## 2018-07-17 NOTE — Assessment & Plan Note (Signed)
prob arthritis  Check xray due to swelling and pain  May need f/u sport med

## 2018-07-27 ENCOUNTER — Other Ambulatory Visit: Payer: Self-pay | Admitting: Family Medicine

## 2018-07-27 DIAGNOSIS — E876 Hypokalemia: Secondary | ICD-10-CM

## 2018-07-31 ENCOUNTER — Ambulatory Visit (INDEPENDENT_AMBULATORY_CARE_PROVIDER_SITE_OTHER): Payer: Medicare Other | Admitting: Family Medicine

## 2018-07-31 ENCOUNTER — Encounter: Payer: Self-pay | Admitting: Family Medicine

## 2018-07-31 VITALS — BP 135/74 | HR 72 | Temp 97.7°F | Resp 16 | Ht 66.0 in | Wt 152.2 lb

## 2018-07-31 DIAGNOSIS — M1711 Unilateral primary osteoarthritis, right knee: Secondary | ICD-10-CM | POA: Diagnosis not present

## 2018-07-31 DIAGNOSIS — M7989 Other specified soft tissue disorders: Secondary | ICD-10-CM | POA: Diagnosis not present

## 2018-07-31 MED ORDER — DICLOFENAC SODIUM 1 % TD GEL: 4.0000 g | Freq: Four times a day (QID) | TRANSDERMAL | 2 refills | Status: DC

## 2018-07-31 NOTE — Assessment & Plan Note (Signed)
Improved Cont diuretic Elevate low ext Compression socks rto prn

## 2018-07-31 NOTE — Progress Notes (Signed)
Patient ID: Belinda Anderson, female    DOB: March 31, 1932  Age: 84 y.o. MRN: [id number]    Subjective:  Subjective  HPI [name] presents for f/u swelling and knee pain.  The swelling has improved.  Her knee only bothers her on occasion.  Doppler was neg for dvt--- +bakers cyst and xray-- min arthritic changes   Review of Systems  Constitutional: Negative for appetite change, diaphoresis, fatigue and unexpected weight change.  Eyes: Negative for pain, redness and visual disturbance.  Respiratory: Negative for cough, chest tightness, shortness of breath and wheezing.   Cardiovascular: Positive for leg swelling. Negative for chest pain and palpitations.  Endocrine: Negative for cold intolerance, heat intolerance, polydipsia, polyphagia and polyuria.  Genitourinary: Negative for difficulty urinating, dysuria and frequency.  Musculoskeletal: Positive for arthralgias and gait problem.  Neurological: Negative for dizziness, light-headedness, numbness and headaches.    History Past Medical History:  Diagnosis Date  . Arthritis   . Hypertension   . Thyroid disease     She has a past surgical history that includes Eye surgery.   Her family history includes Arthritis in her mother; Coronary artery disease in her unknown relative; Diabetes in her brother; Heart disease in her mother; Heart disease (age of onset: 7) in her father.She reports that she has never smoked. She has never used smokeless tobacco. She reports that she does not drink alcohol or use drugs.  Current Outpatient Medications on File Prior to Visit  Medication Sig Dispense Refill  . Acetaminophen (TYLENOL ARTHRITIS PAIN PO) Take 2 tablets by mouth every 6 (six) hours as needed (pain).     Marland Kitchen amLODipine (NORVASC) 2.5 MG tablet TAKE 1 TABLET BY MOUTH ONCE DAILY 90 tablet 1  . aspirin EC 325 MG EC tablet Take 325 mg by mouth daily.      . furosemide (LASIX) 40 MG tablet Take 1 tablet (40 mg total) by mouth daily. 30 tablet 3   . levothyroxine (SYNTHROID, LEVOTHROID) 75 MCG tablet Take 1 tablet (75 mcg total) by mouth daily. 30 tablet 2  . NONFORMULARY OR COMPOUNDED ITEM Compression stockings 20-30 mm hg  #1  Dx edema 1 each 0  . potassium chloride SA (K-DUR,KLOR-CON) 20 MEQ tablet TAKE 1 TABLET BY MOUTH ONCE DAILY 90 tablet 0  . pravastatin (PRAVACHOL) 40 MG tablet Take 1 tablet (40 mg total) by mouth daily. 30 tablet 2   No current facility-administered medications on file prior to visit.      Objective:  Objective  Physical Exam  Constitutional: She is oriented to person, place, and time. She appears well-developed and well-nourished.  HENT:  Head: Normocephalic and atraumatic.  Eyes: Conjunctivae and EOM are normal.  Neck: Normal range of motion. Neck supple. No JVD present. Carotid bruit is not present. No thyromegaly present.  Cardiovascular: Normal rate, regular rhythm and normal heart sounds.  No murmur heard. Pulmonary/Chest: Effort normal and breath sounds normal. No respiratory distress. She has no wheezes. She has no rales. She exhibits no tenderness.  Musculoskeletal: She exhibits edema and tenderness.       Right knee: She exhibits swelling. Tenderness found.       Right ankle: She exhibits swelling.       Legs: r leg tr pitting edema  no calf pain   Neurological: She is alert and oriented to person, place, and time.  Psychiatric: She has a normal mood and affect.  Nursing note and vitals reviewed.  BP 135/74 (BP Location: Left Arm, Cuff Size:  Normal)   Pulse 72   Temp 97.7 F (36.5 C) (Oral)   Resp 16   Ht 5\' 6"  (1.676 m)   Wt 152 lb 3.2 oz (69 kg)   SpO2 99%   BMI 24.57 kg/m  Wt Readings from Last 3 Encounters:  07/31/18 152 lb 3.2 oz (69 kg)  07/17/18 156 lb 9.6 oz (71 kg)  04/06/18 147 lb (66.7 kg)     Lab Results  Component Value Date   WBC 6.9 02/22/2018   HGB 12.6 02/22/2018   HCT 38.4 02/22/2018   PLT 234 02/22/2018   GLUCOSE 96 06/25/2018   CHOL 168 06/25/2018    TRIG 174.0 (H) 06/25/2018   HDL 42.90 06/25/2018   LDLDIRECT 97.0 04/06/2018   LDLCALC 90 06/25/2018   ALT 8 06/25/2018   AST 12 06/25/2018   NA 142 06/25/2018   K 3.5 06/25/2018   CL 102 06/25/2018   CREATININE 1.46 (H) 06/25/2018   BUN 18 06/25/2018   CO2 32 06/25/2018   TSH 1.12 06/25/2018   INR 1.03 07/10/2013    US Venous Img Lower Unilateral Right  Result Date: 07/17/2018 CLINICAL DATA:  Right lower extremity pain and swelling EXAM: RIGHT LOWER EXTREMITY VENOUS DOPPLER ULTRASOUND TECHNIQUE: Gray-scale sonography with graded compression, as well as color Doppler and duplex ultrasound were performed to evaluate the lower extremity deep venous systems from the level of the common femoral vein and including the common femoral, femoral, profunda femoral, popliteal and calf veins including the posterior tibial, peroneal and gastrocnemius veins when visible. The superficial great saphenous vein was also interrogated. Spectral Doppler was utilized to evaluate flow at rest and with distal augmentation maneuvers in the common femoral, femoral and popliteal veins. COMPARISON:  None. FINDINGS: Contralateral Common Femoral Vein: Respiratory phasicity is normal and symmetric with the symptomatic side. No evidence of thrombus. Normal compressibility. Common Femoral Vein: No evidence of thrombus. Normal compressibility, respiratory phasicity and response to augmentation. Saphenofemoral Junction: No evidence of thrombus. Normal compressibility and flow on color Doppler imaging. Profunda Femoral Vein: No evidence of thrombus. Normal compressibility and flow on color Doppler imaging. Femoral Vein: No evidence of thrombus. Normal compressibility, respiratory phasicity and response to augmentation. Popliteal Vein: No evidence of thrombus. Normal compressibility, respiratory phasicity and response to augmentation. Calf Veins: No evidence of thrombus. Normal compressibility and flow on color Doppler imaging.  Superficial Great Saphenous Vein: No evidence of thrombus. Normal compressibility. Branching varicosities noted from the saphenous venous system. Saphenous reflux noted. Venous Reflux: Reflux noted at the saphenofemoral junction and within the GSV. Other Findings: Suspect small Baker cyst measuring 4.3 cm in length but only 0.9 cm in diameter. IMPRESSION: Negative for DVT in the right lower extremity. Suspect small right Baker's cyst GSV venous insufficiency with varicosities. Electronically Signed   By: Judie Petit.  Shick M.D.   On: 07/17/2018 15:36   Dg Knee Complete 4 Views Right  Result Date: 07/17/2018 CLINICAL DATA:  84 year old female with posterior right knee pain and swelling for 4-5 days. No injury. Initial encounter. EXAM: RIGHT KNEE - COMPLETE 4+ VIEW COMPARISON:  None. FINDINGS: Very mild medial tibiofemoral joint space narrowing. No fracture or dislocation. No evidence of joint effusion. IMPRESSION: 1. Very mild medial tibiofemoral joint space narrowing. 2.  No fracture or dislocation. Electronically Signed   By: Lacy Duverney M.D.   On: 07/17/2018 16:01     Assessment & Plan:  Plan  I am having Blase Mess start on diclofenac sodium. I am also  having her maintain her aspirin EC, NONFORMULARY OR COMPOUNDED ITEM, Acetaminophen (TYLENOL ARTHRITIS PAIN PO), amLODipine, pravastatin, levothyroxine, furosemide, and potassium chloride SA.  Meds ordered this encounter  Medications  . diclofenac sodium (VOLTAREN) 1 % GEL    Sig: Apply 4 g topically 4 (four) times daily.    Dispense:  100 g    Refill:  2    Problem List Items Addressed This Visit      Unprioritized   Primary osteoarthritis of right knee - Primary    Diclofenac gel  Call if pain worsens and we can refer to sport med/ ortho      Relevant Medications   diclofenac sodium (VOLTAREN) 1 % GEL   Right leg swelling    Improved Cont diuretic Elevate low ext Compression socks rto prn          Follow-up: Return in about 6  months (around 01/30/2019), or if symptoms worsen or fail to improve, for annual exam, fasting.  [name], DO

## 2018-07-31 NOTE — Patient Instructions (Signed)

## 2018-07-31 NOTE — Assessment & Plan Note (Signed)
Diclofenac gel  Call if pain worsens and we can refer to sport med/ ortho

## 2018-08-01 ENCOUNTER — Telehealth: Payer: Self-pay | Admitting: *Deleted

## 2018-08-01 NOTE — Telephone Encounter (Signed)
PA initiated for diclofenac gel awaiting response.  Cover my meds KEY: [id number]

## 2018-08-02 NOTE — Telephone Encounter (Signed)
PA approved and pharmacy notified. 

## 2018-08-02 NOTE — Telephone Encounter (Signed)
Lorin Picket w/[employer] [phone] wanted the provider to know that the diclofenac gel has been approved for one year, effective 08/01/18.

## 2018-08-10 ENCOUNTER — Ambulatory Visit: Payer: Medicare Other | Admitting: Family Medicine

## 2018-08-10 ENCOUNTER — Encounter: Payer: Self-pay | Admitting: Family Medicine

## 2018-08-10 ENCOUNTER — Ambulatory Visit (INDEPENDENT_AMBULATORY_CARE_PROVIDER_SITE_OTHER): Payer: Medicare Other

## 2018-08-10 VITALS — BP 140/70 | HR 72 | Ht 66.0 in | Wt 152.0 lb

## 2018-08-10 DIAGNOSIS — S90121A Contusion of right lesser toe(s) without damage to nail, initial encounter: Secondary | ICD-10-CM

## 2018-08-10 DIAGNOSIS — S9031XA Contusion of right foot, initial encounter: Secondary | ICD-10-CM

## 2018-08-10 NOTE — Progress Notes (Signed)
Subjective:  Patient ID: Belinda Anderson, female    DOB: 05/01/1932  Age: 84 y.o. MRN: [id number]  CC: No chief complaint on file.   HPI Belinda Anderson presents for evaluation of bruising in her right second and third toes.  She does not remember an injury.  Interestingly it is her fourth toe that is swollen and painful.  She does have a history of hyperlipidemia and has been taking the pravastatin for years.  She also takes a 325 mg aspirin.  She is accompanied by her daughter.  Outpatient Medications Prior to Visit  Medication Sig Dispense Refill  . Acetaminophen (TYLENOL ARTHRITIS PAIN PO) Take 2 tablets by mouth every 6 (six) hours as needed (pain).     Marland Kitchen amLODipine (NORVASC) 2.5 MG tablet TAKE 1 TABLET BY MOUTH ONCE DAILY 90 tablet 1  . aspirin EC 325 MG EC tablet Take 325 mg by mouth daily.      . diclofenac sodium (VOLTAREN) 1 % GEL Apply 4 g topically 4 (four) times daily. 100 g 2  . furosemide (LASIX) 40 MG tablet Take 1 tablet (40 mg total) by mouth daily. 30 tablet 3  . levothyroxine (SYNTHROID, LEVOTHROID) 75 MCG tablet Take 1 tablet (75 mcg total) by mouth daily. 30 tablet 2  . NONFORMULARY OR COMPOUNDED ITEM Compression stockings 20-30 mm hg  #1  Dx edema 1 each 0  . potassium chloride SA (K-DUR,KLOR-CON) 20 MEQ tablet TAKE 1 TABLET BY MOUTH ONCE DAILY 90 tablet 0  . pravastatin (PRAVACHOL) 40 MG tablet Take 1 tablet (40 mg total) by mouth daily. 30 tablet 2   No facility-administered medications prior to visit.     ROS Review of Systems  Constitutional: Negative.   Respiratory: Negative.   Cardiovascular: Negative.   Gastrointestinal: Negative.   Musculoskeletal: Positive for arthralgias, gait problem and joint swelling.  Skin: Positive for color change.  Hematological: Does not bruise/bleed easily.    Objective:  BP 140/70   Pulse 72   Ht 5\' 6"  (1.676 m)   Wt 152 lb (68.9 kg)   SpO2 98%   BMI 24.53 kg/m   BP Readings from Last 3 Encounters:  08/10/18  140/70  07/31/18 135/74  07/17/18 132/60    Wt Readings from Last 3 Encounters:  08/10/18 152 lb (68.9 kg)  07/31/18 152 lb 3.2 oz (69 kg)  07/17/18 156 lb 9.6 oz (71 kg)    Physical Exam  Constitutional: She is oriented to person, place, and time. She appears well-developed and well-nourished. No distress.  HENT:  Head: Normocephalic and atraumatic.  Right Ear: External ear normal.  Left Ear: External ear normal.  Eyes: Right eye exhibits no discharge. Left eye exhibits no discharge. No scleral icterus.  Neck: No JVD present. No tracheal deviation present.  Cardiovascular:  Pulses:      Dorsalis pedis pulses are 1+ on the right side.       Posterior tibial pulses are 1+ on the right side.  Pulmonary/Chest: Effort normal.  Musculoskeletal:       Feet:  Neurological: She is alert and oriented to person, place, and time.  Skin: Capillary refill takes less than 2 seconds. No rash noted. She is not diaphoretic. No erythema. No pallor.  Psychiatric: She has a normal mood and affect. Her behavior is normal.    Lab Results  Component Value Date   WBC 6.9 02/22/2018   HGB 12.6 02/22/2018   HCT 38.4 02/22/2018   PLT 234 02/22/2018  GLUCOSE 96 06/25/2018   CHOL 168 06/25/2018   TRIG 174.0 (H) 06/25/2018   HDL 42.90 06/25/2018   LDLDIRECT 97.0 04/06/2018   LDLCALC 90 06/25/2018   ALT 8 06/25/2018   AST 12 06/25/2018   NA 142 06/25/2018   K 3.5 06/25/2018   CL 102 06/25/2018   CREATININE 1.46 (H) 06/25/2018   BUN 18 06/25/2018   CO2 32 06/25/2018   TSH 1.12 06/25/2018   INR 1.03 07/10/2013    US Venous Img Lower Unilateral Right  Result Date: 07/17/2018 CLINICAL DATA:  Right lower extremity pain and swelling EXAM: RIGHT LOWER EXTREMITY VENOUS DOPPLER ULTRASOUND TECHNIQUE: Gray-scale sonography with graded compression, as well as color Doppler and duplex ultrasound were performed to evaluate the lower extremity deep venous systems from the level of the common femoral  vein and including the common femoral, femoral, profunda femoral, popliteal and calf veins including the posterior tibial, peroneal and gastrocnemius veins when visible. The superficial great saphenous vein was also interrogated. Spectral Doppler was utilized to evaluate flow at rest and with distal augmentation maneuvers in the common femoral, femoral and popliteal veins. COMPARISON:  None. FINDINGS: Contralateral Common Femoral Vein: Respiratory phasicity is normal and symmetric with the symptomatic side. No evidence of thrombus. Normal compressibility. Common Femoral Vein: No evidence of thrombus. Normal compressibility, respiratory phasicity and response to augmentation. Saphenofemoral Junction: No evidence of thrombus. Normal compressibility and flow on color Doppler imaging. Profunda Femoral Vein: No evidence of thrombus. Normal compressibility and flow on color Doppler imaging. Femoral Vein: No evidence of thrombus. Normal compressibility, respiratory phasicity and response to augmentation. Popliteal Vein: No evidence of thrombus. Normal compressibility, respiratory phasicity and response to augmentation. Calf Veins: No evidence of thrombus. Normal compressibility and flow on color Doppler imaging. Superficial Great Saphenous Vein: No evidence of thrombus. Normal compressibility. Branching varicosities noted from the saphenous venous system. Saphenous reflux noted. Venous Reflux: Reflux noted at the saphenofemoral junction and within the GSV. Other Findings: Suspect small Baker cyst measuring 4.3 cm in length but only 0.9 cm in diameter. IMPRESSION: Negative for DVT in the right lower extremity. Suspect small right Baker's cyst GSV venous insufficiency with varicosities. Electronically Signed   By: Judie Petit.  Shick M.D.   On: 07/17/2018 15:36   Dg Knee Complete 4 Views Right  Result Date: 07/17/2018 CLINICAL DATA:  84 year old female with posterior right knee pain and swelling for 4-5 days. No injury. Initial  encounter. EXAM: RIGHT KNEE - COMPLETE 4+ VIEW COMPARISON:  None. FINDINGS: Very mild medial tibiofemoral joint space narrowing. No fracture or dislocation. No evidence of joint effusion. IMPRESSION: 1. Very mild medial tibiofemoral joint space narrowing. 2.  No fracture or dislocation. Electronically Signed   By: Lacy Duverney M.D.   On: 07/17/2018 16:01    Assessment & Plan:   Diagnoses and all orders for this visit:  Contusion of foot including toes, right, initial encounter -     DG Foot Complete Right; Future   I am having Belinda Anderson maintain her aspirin EC, NONFORMULARY OR COMPOUNDED ITEM, Acetaminophen (TYLENOL ARTHRITIS PAIN PO), amLODipine, pravastatin, levothyroxine, furosemide, potassium chloride SA, and diclofenac sodium.  No orders of the defined types were placed in this encounter.  Believe the patient has suffered a contusion of her toes.  X-ray studies are pending.  We will call and let patient know the results.  Follow-up: No follow-ups on file.  Mliss Sax, MD

## 2018-08-20 ENCOUNTER — Other Ambulatory Visit: Payer: Self-pay | Admitting: Family Medicine

## 2018-09-19 ENCOUNTER — Other Ambulatory Visit: Payer: Self-pay | Admitting: Family Medicine

## 2018-10-15 ENCOUNTER — Encounter: Payer: Medicare Other | Admitting: Family Medicine

## 2018-10-21 ENCOUNTER — Other Ambulatory Visit: Payer: Self-pay | Admitting: Family Medicine

## 2018-10-26 ENCOUNTER — Other Ambulatory Visit: Payer: Self-pay | Admitting: Family Medicine

## 2018-10-26 DIAGNOSIS — E876 Hypokalemia: Secondary | ICD-10-CM

## 2018-11-16 ENCOUNTER — Ambulatory Visit (INDEPENDENT_AMBULATORY_CARE_PROVIDER_SITE_OTHER): Payer: Medicare Other | Admitting: Family Medicine

## 2018-11-16 ENCOUNTER — Other Ambulatory Visit: Payer: Self-pay | Admitting: Family Medicine

## 2018-11-16 ENCOUNTER — Encounter: Payer: Self-pay | Admitting: Family Medicine

## 2018-11-16 VITALS — BP 126/70 | HR 70 | Temp 97.6°F | Resp 14 | Ht 66.0 in | Wt 155.0 lb

## 2018-11-16 DIAGNOSIS — E785 Hyperlipidemia, unspecified: Secondary | ICD-10-CM | POA: Diagnosis not present

## 2018-11-16 DIAGNOSIS — Z0000 Encounter for general adult medical examination without abnormal findings: Secondary | ICD-10-CM | POA: Diagnosis not present

## 2018-11-16 DIAGNOSIS — M1711 Unilateral primary osteoarthritis, right knee: Secondary | ICD-10-CM | POA: Diagnosis not present

## 2018-11-16 DIAGNOSIS — M7989 Other specified soft tissue disorders: Secondary | ICD-10-CM

## 2018-11-16 DIAGNOSIS — E039 Hypothyroidism, unspecified: Secondary | ICD-10-CM | POA: Diagnosis not present

## 2018-11-16 DIAGNOSIS — I10 Essential (primary) hypertension: Secondary | ICD-10-CM

## 2018-11-16 DIAGNOSIS — E876 Hypokalemia: Secondary | ICD-10-CM | POA: Diagnosis not present

## 2018-11-16 LAB — COMPREHENSIVE METABOLIC PANEL
ALBUMIN: 4.5 g/dL (ref 3.5–5.2)
ALK PHOS: 108 U/L (ref 39–117)
ALT: 9 U/L (ref 0–35)
AST: 15 U/L (ref 0–37)
BUN: 26 mg/dL — ABNORMAL HIGH (ref 6–23)
CO2: 29 mEq/L (ref 19–32)
Calcium: 10.1 mg/dL (ref 8.4–10.5)
Chloride: 102 mEq/L (ref 96–112)
Creatinine, Ser: 1.55 mg/dL — ABNORMAL HIGH (ref 0.40–1.20)
GFR: 31.82 mL/min — AB (ref >60.00)
Glucose, Bld: 96 mg/dL (ref 70–99)
POTASSIUM: 5 meq/L (ref 3.5–5.1)
Sodium: 142 mEq/L (ref 135–145)
TOTAL PROTEIN: 6.9 g/dL (ref 6.0–8.3)
Total Bilirubin: 0.6 mg/dL (ref 0.2–1.2)

## 2018-11-16 LAB — CBC WITH DIFFERENTIAL/PLATELET
BASOS PCT: 0.5 % (ref 0.0–3.0)
Basophils Absolute: 0 10*3/uL (ref 0.0–0.1)
EOS PCT: 1 % (ref 0.0–5.0)
Eosinophils Absolute: 0.1 10*3/uL (ref 0.0–0.7)
HEMATOCRIT: 42 % (ref 36.0–46.0)
HEMOGLOBIN: 13.9 g/dL (ref 12.0–15.0)
LYMPHS PCT: 23.9 % (ref 12.0–46.0)
Lymphs Abs: 1.6 10*3/uL (ref 0.7–4.0)
MCHC: 33.2 g/dL (ref 30.0–36.0)
MCV: 94.6 fl (ref 78.0–100.0)
Monocytes Absolute: 0.8 10*3/uL (ref 0.1–1.0)
Monocytes Relative: 11.2 % (ref 3.0–12.0)
Neutro Abs: 4.3 10*3/uL (ref 1.4–7.7)
Neutrophils Relative %: 63.4 % (ref 43.0–77.0)
Platelets: 262 10*3/uL (ref 150.0–400.0)
RBC: 4.44 Mil/uL (ref 3.87–5.11)
RDW: 13.3 % (ref 11.5–15.5)
WBC: 6.8 10*3/uL (ref 4.0–10.5)

## 2018-11-16 LAB — LIPID PANEL
CHOLESTEROL: 174 mg/dL (ref 0–200)
HDL: 38.5 mg/dL — ABNORMAL LOW (ref >39.00)
LDL Cholesterol: 107 mg/dL — ABNORMAL HIGH (ref 0–99)
NonHDL: 135.45
TRIGLYCERIDES: 143 mg/dL (ref 0.0–149.0)
Total CHOL/HDL Ratio: 5
VLDL: 28.6 mg/dL (ref 0.0–40.0)

## 2018-11-16 LAB — TSH: TSH: 1.16 u[IU]/mL (ref 0.35–4.50)

## 2018-11-16 MED ORDER — FUROSEMIDE 40 MG PO TABS: 40.0000 mg | ORAL_TABLET | Freq: Every day | ORAL | 3 refills | Status: DC

## 2018-11-16 MED ORDER — LEVOTHYROXINE SODIUM 75 MCG PO TABS: ORAL_TABLET | ORAL | 3 refills | Status: DC

## 2018-11-16 MED ORDER — DICLOFENAC SODIUM 1 % TD GEL: 4.0000 g | Freq: Four times a day (QID) | TRANSDERMAL | 2 refills | Status: DC

## 2018-11-16 MED ORDER — POTASSIUM CHLORIDE CRYS ER 20 MEQ PO TBCR: 20.0000 meq | EXTENDED_RELEASE_TABLET | Freq: Every day | ORAL | 1 refills | Status: DC

## 2018-11-16 MED ORDER — PRAVASTATIN SODIUM 40 MG PO TABS: ORAL_TABLET | ORAL | 1 refills | Status: DC

## 2018-11-16 MED ORDER — AMLODIPINE BESYLATE 2.5 MG PO TABS: ORAL_TABLET | ORAL | 1 refills | Status: DC

## 2018-11-16 NOTE — Progress Notes (Signed)
Subjective:     Belinda Anderson is a 85 y.o. female and is here for a comprehensive physical exam. The patient reports no problems.  Social History   Socioeconomic History  . Marital status: Widowed    Spouse name: Not on file  . Number of children: Not on file  . Years of education: Not on file  . Highest education level: Not on file  Occupational History  . Not on file  Social Needs  . Financial resource strain: Not on file  . Food insecurity:    Worry: Not on file    Inability: Not on file  . Transportation needs:    Medical: Not on file    Non-medical: Not on file  Tobacco Use  . Smoking status: Never Smoker  . Smokeless tobacco: Never Used  Substance and Sexual Activity  . Alcohol use: No  . Drug use: No  . Sexual activity: Not Currently    Partners: Male  Lifestyle  . Physical activity:    Days per week: Not on file    Minutes per session: Not on file  . Stress: Not on file  Relationships  . Social connections:    Talks on phone: Not on file    Gets together: Not on file    Attends religious service: Not on file    Active member of club or organization: Not on file    Attends meetings of clubs or organizations: Not on file    Relationship status: Not on file  . Intimate partner violence:    Fear of current or ex partner: Not on file    Emotionally abused: Not on file    Physically abused: Not on file    Forced sexual activity: Not on file  Other Topics Concern  . 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 TETANUS/TDAP  04/07/2019 (Originally 05/12/1951)  . MAMMOGRAM  11/17/2019 (Originally 03/28/2013)  . DEXA SCAN  11/17/2019 (Originally 05/13/1999)  . INFLUENZA VACCINE  Completed  . PNA vac Low Risk Adult  Completed    The following portions of the patient's history were reviewed and updated as appropriate: She  has a past medical history of  Arthritis, Hypertension, and Thyroid disease. She does not have any pertinent problems on file. She  has a past surgical history that includes Eye surgery. Her family history includes Arthritis in her mother; Coronary artery disease in an other family member; Diabetes in her brother; Heart disease in her mother; Heart disease (age of onset: 1) in her father. She  reports that she has never smoked. She has never used smokeless tobacco. She reports that she does not drink alcohol or use drugs. She has a current medication list which includes the following prescription(s): acetaminophen, amlodipine, aspirin ec, diclofenac sodium, furosemide, levothyroxine, NONFORMULARY OR COMPOUNDED ITEM, potassium chloride sa, and pravastatin. Current Outpatient Medications on File Prior to Visit  Medication Sig Dispense Refill  . Acetaminophen (TYLENOL ARTHRITIS PAIN PO) Take 2 tablets by mouth every 6 (six) hours as needed (pain).     Marland Kitchen aspirin EC 325 MG EC tablet Take 325 mg by mouth daily.      . NONFORMULARY OR COMPOUNDED ITEM Compression stockings 20-30 mm hg  #1  Dx edema 1 each 0   No current facility-administered medications on file prior to visit.    She has No Known Allergies.Marland Kitchen  Review of Systems Review of Systems  Constitutional: Negative for activity change, appetite change and fatigue.  HENT: Negative for hearing loss, congestion, tinnitus and ear discharge.  dentist [id number] Eyes: Negative for visual disturbance (see optho q1y -- vision corrected to 20/20 with glasses).  Respiratory: Negative for cough, chest tightness and shortness of breath.   Cardiovascular: Negative for chest pain, palpitations and leg swelling.  Gastrointestinal: Negative for abdominal pain, diarrhea, constipation and abdominal distention.  Genitourinary: Negative for urgency, frequency, decreased urine volume and difficulty urinating.  Musculoskeletal: Negative for back pain, arthralgias and gait problem.  Skin: Negative for  color change, pallor and rash.  Neurological: Negative for dizziness, light-headedness, numbness and headaches.  Hematological: Negative for adenopathy. Does not bruise/bleed easily.  Psychiatric/Behavioral: Negative for suicidal ideas, confusion, sleep disturbance, self-injury, dysphoric mood, decreased concentration and agitation.       Objective:    BP 126/70 (BP Location: Left Arm, Patient Position: Sitting, Cuff Size: Normal)   Pulse 70   Temp 97.6 F (36.4 C)   Resp 14   Ht 5\' 6"  (1.676 m)   Wt 155 lb (70.3 kg)   SpO2 96%   BMI 25.02 kg/m  General appearance: alert, cooperative, appears stated age and no distress Head: Normocephalic, without obvious abnormality, atraumatic Eyes: conjunctivae/corneas clear. PERRL, EOM's intact. Fundi benign. Ears: normal TM's and external ear canals both ears Nose: Nares normal. Septum midline. Mucosa normal. No drainage or sinus tenderness. Throat: lips, mucosa, and tongue normal; teeth and gums normal Neck: no adenopathy, no carotid bruit, no JVD, supple, symmetrical, trachea midline and thyroid not enlarged, symmetric, no tenderness/mass/nodules Back: symmetric, no curvature. ROM normal. No CVA tenderness. Lungs: clear to auscultation bilaterally Breasts: normal appearance, no masses or tenderness Heart: regular rate and rhythm, S1, S2 normal, no murmur, click, rub or gallop Abdomen: soft, non-tender; bowel sounds normal; no masses,  no organomegaly Pelvic: not indicated; post-menopausal, no abnormal Pap smears in past Extremities: extremities normal, atraumatic, no cyanosis or edema Pulses: 2+ and symmetric Skin: Skin color, texture, turgor normal. No rashes or lesions Lymph nodes: Cervical, supraclavicular, and axillary nodes normal. Neurologic: Alert and oriented X 3, normal strength and tone. Normal symmetric reflexes. Normal coordination and gait    Assessment:    Healthy female exam.     Plan:  ghm utd Check labs    See  After Visit Summary for Counseling Recommendations     1. Low serum potassium level Recheck today - potassium chloride SA (K-DUR,KLOR-CON) 20 MEQ tablet; Take 1 tablet (20 mEq total) by mouth daily.  Dispense: 90 tablet; Refill: 1  2. Right leg swelling Much better con't meds Elevated legs  - furosemide (LASIX) 40 MG tablet; Take 1 tablet (40 mg total) by mouth daily.  Dispense: 90 tablet; Refill: 3  3. Primary osteoarthritis of right knee stable - diclofenac sodium (VOLTAREN) 1 % GEL; Apply 4 g topically 4 (four) times daily.  Dispense: 100 g; Refill: 2  4. Essential hypertension Well controlled, no changes to meds. Encouraged heart healthy diet such as the DASH diet and exercise as tolerated.  - amLODipine (NORVASC) 2.5 MG tablet; TAKE 1 TABLET BY MOUTH ONCE DAILY  Dispense: 90 tablet; Refill: 1 - Lipid panel - CBC with Differential/Platelet - TSH - Comprehensive metabolic panel  5. Hypothyroidism, unspecified type Check labs  - levothyroxine (SYNTHROID, LEVOTHROID) 75 MCG tablet; TAKE 1 TABLET(75 MCG) BY MOUTH DAILY  Dispense: 90 tablet; Refill: 3 - TSH  6. Hyperlipidemia, unspecified hyperlipidemia  type Tolerating statin, encouraged heart healthy diet, avoid trans fats, minimize simple carbs and saturated fats. Increase exercise as tolerated - pravastatin (PRAVACHOL) 40 MG tablet; TAKE 1 TABLET(40 MG) BY MOUTH DAILY  Dispense: 90 tablet; Refill: 1 - Lipid panel - Comprehensive metabolic panel  7. Preventative health care ghm utd Check labs  See above - Lipid panel - CBC with Differential/Platelet - TSH - Comprehensive metabolic panel

## 2018-11-16 NOTE — Patient Instructions (Signed)
Preventive Care 83 Years and Older, Female Preventive care refers to lifestyle choices and visits with your health care provider that can promote health and wellness. What does preventive care include?  A yearly physical exam. This is also called an annual well check.  Dental exams once or twice a year.  Routine eye exams. Ask your health care provider how often you should have your eyes checked.  Personal lifestyle choices, including: ? Daily care of your teeth and gums. ? Regular physical activity. ? Eating a healthy diet. ? Avoiding tobacco and drug use. ? Limiting alcohol use. ? Practicing safe sex. ? Taking low-dose aspirin every day. ? Taking vitamin and mineral supplements as recommended by your health care provider. What happens during an annual well check? The services and screenings done by your health care provider during your annual well check will depend on your age, overall health, lifestyle risk factors, and family history of disease. Counseling Your health care provider may ask you questions about your:  Alcohol use.  Tobacco use.  Drug use.  Emotional well-being.  Home and relationship well-being.  Sexual activity.  Eating habits.  History of falls.  Memory and ability to understand (cognition).  Work and work Statistician.  Reproductive health.  Screening You may have the following tests or measurements:  Height, weight, and BMI.  Blood pressure.  Lipid and cholesterol levels. These may be checked every 5 years, or more frequently if you are over 30 years old.  Skin check.  Lung cancer screening. You may have this screening every year starting at age 27 if you have a 30-pack-year history of smoking and currently smoke or have quit within the past 15 years.  Colorectal cancer screening. All adults should have this screening starting at age 33 and continuing until age 46. You will have tests every 1-10 years, depending on your results and the  type of screening test. People at increased risk should start screening at an earlier age. Screening tests may include: ? Guaiac-based fecal occult blood testing. ? Fecal immunochemical test (FIT). ? Stool DNA test. ? Virtual colonoscopy. ? Sigmoidoscopy. During this test, a flexible tube with a tiny camera (sigmoidoscope) is used to examine your rectum and lower colon. The sigmoidoscope is inserted through your anus into your rectum and lower colon. ? Colonoscopy. During this test, a long, thin, flexible tube with a tiny camera (colonoscope) is used to examine your entire colon and rectum.  Hepatitis C blood test.  Hepatitis B blood test.  Sexually transmitted disease (STD) testing.  Diabetes screening. This is done by checking your blood sugar (glucose) after you have not eaten for a while (fasting). You may have this done every 1-3 years.  Bone density scan. This is done to screen for osteoporosis. You may have this done starting at age 37.  Mammogram. This may be done every 1-2 years. Talk to your health care provider about how often you should have regular mammograms. Talk with your health care provider about your test results, treatment options, and if necessary, the need for more tests. Vaccines Your health care provider may recommend certain vaccines, such as:  Influenza vaccine. This is recommended every year.  Tetanus, diphtheria, and acellular pertussis (Tdap, Td) vaccine. You may need a Td booster every 10 years.  Varicella vaccine. You may need this if you have not been vaccinated.  Zoster vaccine. You may need this after age 38.  Measles, mumps, and rubella (MMR) vaccine. You may need at least  one dose of MMR if you were born in 1957 or later. You may also need a second dose.  Pneumococcal 13-valent conjugate (PCV13) vaccine. One dose is recommended after age 24.  Pneumococcal polysaccharide (PPSV23) vaccine. One dose is recommended after age 24.  Meningococcal  vaccine. You may need this if you have certain conditions.  Hepatitis A vaccine. You may need this if you have certain conditions or if you travel or work in places where you may be exposed to hepatitis A.  Hepatitis B vaccine. You may need this if you have certain conditions or if you travel or work in places where you may be exposed to hepatitis B.  Haemophilus influenzae type b (Hib) vaccine. You may need this if you have certain conditions. Talk to your health care provider about which screenings and vaccines you need and how often you need them. This information is not intended to replace advice given to you by your health care provider. Make sure you discuss any questions you have with your health care provider. Document Released: 10/30/2015 Document Revised: 11/23/2017 Document Reviewed: 08/04/2015 Elsevier Interactive Patient Education  2019 [employer].

## 2018-11-25 ENCOUNTER — Other Ambulatory Visit: Payer: Self-pay | Admitting: Family Medicine

## 2018-11-25 DIAGNOSIS — M7989 Other specified soft tissue disorders: Secondary | ICD-10-CM

## 2018-12-03 ENCOUNTER — Other Ambulatory Visit: Payer: Self-pay | Admitting: Family Medicine

## 2018-12-03 DIAGNOSIS — E039 Hypothyroidism, unspecified: Secondary | ICD-10-CM

## 2018-12-03 DIAGNOSIS — E785 Hyperlipidemia, unspecified: Secondary | ICD-10-CM

## 2018-12-03 NOTE — Telephone Encounter (Signed)
Copied from CRM [id number]. Topic: Quick Communication - Rx Refill/Question >> Dec 03, 2018  5:00 PM Wyonia Hough E wrote: Medication: levothyroxine (SYNTHROID, LEVOTHROID) 75 MCG tablet   pravastatin (PRAVACHOL) 40 MG tablet   Has the patient contacted their pharmacy? Yes - Pharmacy called to request   Preferred Pharmacy (with phone number or street name): [id number] [place], [place] - [phone] AT [facility] [address] [phone] (Phone) [phone] (Fax)    Agent: Please be advised that RX refills may take up to 3 business days. We ask that you follow-up with your pharmacy.

## 2019-02-15 ENCOUNTER — Other Ambulatory Visit: Payer: Self-pay

## 2019-02-15 ENCOUNTER — Other Ambulatory Visit (INDEPENDENT_AMBULATORY_CARE_PROVIDER_SITE_OTHER): Payer: Medicare Other

## 2019-02-15 DIAGNOSIS — E785 Hyperlipidemia, unspecified: Secondary | ICD-10-CM

## 2019-02-15 LAB — COMPREHENSIVE METABOLIC PANEL
ALT: 7 U/L (ref 0–35)
AST: 13 U/L (ref 0–37)
Albumin: 4.1 g/dL (ref 3.5–5.2)
Alkaline Phosphatase: 108 U/L (ref 39–117)
BUN: 21 mg/dL (ref 6–23)
CO2: 29 mEq/L (ref 19–32)
Calcium: 9.1 mg/dL (ref 8.4–10.5)
Chloride: 104 mEq/L (ref 96–112)
Creatinine, Ser: 1.46 mg/dL — ABNORMAL HIGH (ref 0.40–1.20)
GFR: 34.07 mL/min — ABNORMAL LOW (ref >60.00)
Glucose, Bld: 90 mg/dL (ref 70–99)
Potassium: 4.2 mEq/L (ref 3.5–5.1)
Sodium: 143 mEq/L (ref 135–145)
Total Bilirubin: 0.5 mg/dL (ref 0.2–1.2)
Total Protein: 6.4 g/dL (ref 6.0–8.3)

## 2019-02-15 LAB — LIPID PANEL
Cholesterol: 152 mg/dL (ref 0–200)
HDL: 38.4 mg/dL — ABNORMAL LOW (ref >39.00)
LDL Cholesterol: 88 mg/dL (ref 0–99)
NonHDL: 114.08
Total CHOL/HDL Ratio: 4
Triglycerides: 132 mg/dL (ref 0.0–149.0)
VLDL: 26.4 mg/dL (ref 0.0–40.0)

## 2019-02-26 ENCOUNTER — Encounter: Payer: Self-pay | Admitting: *Deleted

## 2019-05-05 ENCOUNTER — Encounter: Payer: Self-pay | Admitting: Family Medicine

## 2019-05-06 ENCOUNTER — Encounter: Payer: Self-pay | Admitting: Family Medicine

## 2019-05-06 NOTE — Telephone Encounter (Signed)
Please review pt's Mychart message.

## 2019-05-06 NOTE — Telephone Encounter (Signed)
Letter is done----- one needs to be sent to dmv as well  Address is on letter

## 2019-05-06 NOTE — Telephone Encounter (Signed)
I can send a letter to the dept of motor veh-- so they will not renew it but family will need to take away license and keys  Does she want to wait until her ov -- or would she like me to send the letter ?

## 2019-05-10 ENCOUNTER — Encounter: Payer: Self-pay | Admitting: Family Medicine

## 2019-05-10 ENCOUNTER — Ambulatory Visit (INDEPENDENT_AMBULATORY_CARE_PROVIDER_SITE_OTHER): Payer: Medicare Other | Admitting: Family Medicine

## 2019-05-10 ENCOUNTER — Other Ambulatory Visit: Payer: Self-pay

## 2019-05-10 DIAGNOSIS — E876 Hypokalemia: Secondary | ICD-10-CM | POA: Diagnosis not present

## 2019-05-10 DIAGNOSIS — M7989 Other specified soft tissue disorders: Secondary | ICD-10-CM | POA: Diagnosis not present

## 2019-05-10 DIAGNOSIS — E785 Hyperlipidemia, unspecified: Secondary | ICD-10-CM

## 2019-05-10 DIAGNOSIS — I10 Essential (primary) hypertension: Secondary | ICD-10-CM | POA: Diagnosis not present

## 2019-05-10 DIAGNOSIS — E039 Hypothyroidism, unspecified: Secondary | ICD-10-CM | POA: Diagnosis not present

## 2019-05-10 DIAGNOSIS — M1711 Unilateral primary osteoarthritis, right knee: Secondary | ICD-10-CM

## 2019-05-10 LAB — COMPREHENSIVE METABOLIC PANEL
ALT: 8 U/L (ref 0–35)
AST: 15 U/L (ref 0–37)
Albumin: 4.2 g/dL (ref 3.5–5.2)
Alkaline Phosphatase: 105 U/L (ref 39–117)
BUN: 21 mg/dL (ref 6–23)
CO2: 29 mEq/L (ref 19–32)
Calcium: 9.3 mg/dL (ref 8.4–10.5)
Chloride: 105 mEq/L (ref 96–112)
Creatinine, Ser: 1.62 mg/dL — ABNORMAL HIGH (ref 0.40–1.20)
GFR: 30.2 mL/min — ABNORMAL LOW (ref >60.00)
Glucose, Bld: 85 mg/dL (ref 70–99)
Potassium: 4.2 mEq/L (ref 3.5–5.1)
Sodium: 143 mEq/L (ref 135–145)
Total Bilirubin: 0.6 mg/dL (ref 0.2–1.2)
Total Protein: 6.3 g/dL (ref 6.0–8.3)

## 2019-05-10 LAB — LIPID PANEL
Cholesterol: 149 mg/dL (ref 0–200)
HDL: 39.8 mg/dL (ref >39.00)
LDL Cholesterol: 84 mg/dL (ref 0–99)
NonHDL: 109.47
Total CHOL/HDL Ratio: 4
Triglycerides: 128 mg/dL (ref 0.0–149.0)
VLDL: 25.6 mg/dL (ref 0.0–40.0)

## 2019-05-10 LAB — TSH: TSH: 1.28 u[IU]/mL (ref 0.35–4.50)

## 2019-05-10 MED ORDER — AMLODIPINE BESYLATE 2.5 MG PO TABS: ORAL_TABLET | ORAL | 1 refills | Status: DC

## 2019-05-10 MED ORDER — DICLOFENAC SODIUM 1 % TD GEL: 4.0000 g | Freq: Four times a day (QID) | TRANSDERMAL | 2 refills | Status: DC

## 2019-05-10 MED ORDER — POTASSIUM CHLORIDE CRYS ER 20 MEQ PO TBCR: 20.0000 meq | EXTENDED_RELEASE_TABLET | Freq: Every day | ORAL | 1 refills | Status: DC

## 2019-05-10 MED ORDER — PRAVASTATIN SODIUM 40 MG PO TABS: ORAL_TABLET | ORAL | 1 refills | Status: DC

## 2019-05-10 MED ORDER — FUROSEMIDE 40 MG PO TABS: 40.0000 mg | ORAL_TABLET | Freq: Every day | ORAL | 3 refills | Status: DC

## 2019-05-10 MED ORDER — LEVOTHYROXINE SODIUM 75 MCG PO TABS: ORAL_TABLET | ORAL | 3 refills | Status: DC

## 2019-05-10 NOTE — Assessment & Plan Note (Signed)
Stable con't meds Check labs 

## 2019-05-10 NOTE — Progress Notes (Signed)
Patient ID: Belinda Anderson, female    DOB: 05/17/1932  Age: 85 y.o. MRN: [id number]    Subjective:  Subjective  HPI Belinda Messlizabeth Yeley presents for f/u bp , chol and thyroid Pt has no complaints    Review of Systems  Constitutional: Negative for appetite change, diaphoresis, fatigue and unexpected weight change.  Eyes: Negative for pain, redness and visual disturbance.  Respiratory: Negative for cough, chest tightness, shortness of breath and wheezing.   Cardiovascular: Negative for chest pain, palpitations and leg swelling.  Endocrine: Negative for cold intolerance, heat intolerance, polydipsia, polyphagia and polyuria.  Genitourinary: Negative for difficulty urinating, dysuria and frequency.  Neurological: Negative for dizziness, light-headedness, numbness and headaches.    History Past Medical History:  Diagnosis Date  . Arthritis   . Hypertension   . Thyroid disease     She has a past surgical history that includes Eye surgery.   Her family history includes Arthritis in her mother; Coronary artery disease in an other family member; Diabetes in her brother; Heart disease in her mother; Heart disease (age of onset: 3540) in her father.She reports that she has never smoked. She has never used smokeless tobacco. She reports that she does not drink alcohol or use drugs.  Current Outpatient Medications on File Prior to Visit  Medication Sig Dispense Refill  . Acetaminophen (TYLENOL ARTHRITIS PAIN PO) Take 2 tablets by mouth every 6 (six) hours as needed (pain).     Marland Kitchen. aspirin EC 325 MG EC tablet Take 325 mg by mouth daily.      . NONFORMULARY OR COMPOUNDED ITEM Compression stockings 20-30 mm hg  #1  Dx edema 1 each 0   No current facility-administered medications on file prior to visit.      Objective:  Objective  Physical Exam Vitals signs and nursing note reviewed.  Constitutional:      Appearance: She is well-developed.  HENT:     Head: Normocephalic and atraumatic.  Eyes:      Conjunctiva/sclera: Conjunctivae normal.  Neck:     Musculoskeletal: Normal range of motion and neck supple.     Thyroid: No thyromegaly.     Vascular: No carotid bruit or JVD.  Cardiovascular:     Rate and Rhythm: Normal rate and regular rhythm.     Heart sounds: Normal heart sounds. No murmur.  Pulmonary:     Effort: Pulmonary effort is normal. No respiratory distress.     Breath sounds: Normal breath sounds. No wheezing or rales.  Chest:     Chest wall: No tenderness.  Neurological:     Mental Status: She is alert and oriented to person, place, and time.    BP 107/89 (BP Location: Left Arm, Patient Position: Sitting, Cuff Size: Normal)   Pulse 68   Temp 98.5 F (36.9 C) (Oral)   Resp 18   Ht 5\' 6"  (1.676 m)   Wt 151 lb 3.2 oz (68.6 kg)   SpO2 98%   BMI 24.40 kg/m  Wt Readings from Last 3 Encounters:  05/10/19 151 lb 3.2 oz (68.6 kg)  11/16/18 155 lb (70.3 kg)  08/10/18 152 lb (68.9 kg)     Lab Results  Component Value Date   WBC 6.8 11/16/2018   HGB 13.9 11/16/2018   HCT 42.0 11/16/2018   PLT 262.0 11/16/2018   GLUCOSE 85 05/10/2019   CHOL 149 05/10/2019   TRIG 128.0 05/10/2019   HDL 39.80 05/10/2019   LDLDIRECT 97.0 04/06/2018   LDLCALC 84 05/10/2019  ALT 8 05/10/2019   AST 15 05/10/2019   NA 143 05/10/2019   K 4.2 05/10/2019   CL 105 05/10/2019   CREATININE 1.62 (H) 05/10/2019   BUN 21 05/10/2019   CO2 29 05/10/2019   TSH 1.28 05/10/2019   INR 1.03 07/10/2013    US Venous Img Lower Unilateral Right  Result Date: 07/17/2018 CLINICAL DATA:  Right lower extremity pain and swelling EXAM: RIGHT LOWER EXTREMITY VENOUS DOPPLER ULTRASOUND TECHNIQUE: Gray-scale sonography with graded compression, as well as color Doppler and duplex ultrasound were performed to evaluate the lower extremity deep venous systems from the level of the common femoral vein and including the common femoral, femoral, profunda femoral, popliteal and calf veins including the  posterior tibial, peroneal and gastrocnemius veins when visible. The superficial great saphenous vein was also interrogated. Spectral Doppler was utilized to evaluate flow at rest and with distal augmentation maneuvers in the common femoral, femoral and popliteal veins. COMPARISON:  None. FINDINGS: Contralateral Common Femoral Vein: Respiratory phasicity is normal and symmetric with the symptomatic side. No evidence of thrombus. Normal compressibility. Common Femoral Vein: No evidence of thrombus. Normal compressibility, respiratory phasicity and response to augmentation. Saphenofemoral Junction: No evidence of thrombus. Normal compressibility and flow on color Doppler imaging. Profunda Femoral Vein: No evidence of thrombus. Normal compressibility and flow on color Doppler imaging. Femoral Vein: No evidence of thrombus. Normal compressibility, respiratory phasicity and response to augmentation. Popliteal Vein: No evidence of thrombus. Normal compressibility, respiratory phasicity and response to augmentation. Calf Veins: No evidence of thrombus. Normal compressibility and flow on color Doppler imaging. Superficial Great Saphenous Vein: No evidence of thrombus. Normal compressibility. Branching varicosities noted from the saphenous venous system. Saphenous reflux noted. Venous Reflux: Reflux noted at the saphenofemoral junction and within the GSV. Other Findings: Suspect small Baker cyst measuring 4.3 cm in length but only 0.9 cm in diameter. IMPRESSION: Negative for DVT in the right lower extremity. Suspect small right Baker's cyst GSV venous insufficiency with varicosities. Electronically Signed   By: Jerilynn Mages.  Shick M.D.   On: 07/17/2018 15:36   Dg Knee Complete 4 Views Right  Result Date: 07/17/2018 CLINICAL DATA:  85 year old female with posterior right knee pain and swelling for 4-5 days. No injury. Initial encounter. EXAM: RIGHT KNEE - COMPLETE 4+ VIEW COMPARISON:  None. FINDINGS: Very mild medial tibiofemoral  joint space narrowing. No fracture or dislocation. No evidence of joint effusion. IMPRESSION: 1. Very mild medial tibiofemoral joint space narrowing. 2.  No fracture or dislocation. Electronically Signed   By: Genia Del M.D.   On: 07/17/2018 16:01     Assessment & Plan:  Plan  I have changed Benjamine Mola Tetro's potassium chloride SA and levothyroxine. I am also having her maintain her aspirin EC, NONFORMULARY OR COMPOUNDED ITEM, Acetaminophen (TYLENOL ARTHRITIS PAIN PO), pravastatin, furosemide, amLODipine, and diclofenac sodium.  Meds ordered this encounter  Medications  . pravastatin (PRAVACHOL) 40 MG tablet    Sig: TAKE 1 TABLET(40 MG) BY MOUTH DAILY    Dispense:  90 tablet    Refill:  1  . potassium chloride SA (K-DUR) 20 MEQ tablet    Sig: Take 1 tablet (20 mEq total) by mouth daily.    Dispense:  90 tablet    Refill:  1  . levothyroxine (SYNTHROID) 75 MCG tablet    Sig: TAKE 1 TABLET(75 MCG) BY MOUTH DAILY    Dispense:  90 tablet    Refill:  3  . furosemide (LASIX) 40 MG tablet  Sig: Take 1 tablet (40 mg total) by mouth daily.    Dispense:  90 tablet    Refill:  3  . amLODipine (NORVASC) 2.5 MG tablet    Sig: TAKE 1 TABLET BY MOUTH ONCE DAILY    Dispense:  90 tablet    Refill:  1  . diclofenac sodium (VOLTAREN) 1 % GEL    Sig: Apply 4 g topically 4 (four) times daily.    Dispense:  100 g    Refill:  2    Problem List Items Addressed This Visit      Unprioritized   Essential hypertension    Well controlled, no changes to meds. Encouraged heart healthy diet such as the DASH diet and exercise as tolerated.       Relevant Medications   pravastatin (PRAVACHOL) 40 MG tablet   furosemide (LASIX) 40 MG tablet   amLODipine (NORVASC) 2.5 MG tablet   Other Relevant Orders   Lipid panel (Completed)   Comprehensive metabolic panel (Completed)   TSH (Completed)   Hyperlipidemia    Encouraged heart healthy diet, increase exercise, avoid trans fats, consider a krill oil  cap daily       Relevant Medications   pravastatin (PRAVACHOL) 40 MG tablet   furosemide (LASIX) 40 MG tablet   amLODipine (NORVASC) 2.5 MG tablet   Other Relevant Orders   Lipid panel (Completed)   Comprehensive metabolic panel (Completed)   Hypothyroidism    Stable con't meds Check labs      Relevant Medications   levothyroxine (SYNTHROID) 75 MCG tablet   Other Relevant Orders   TSH (Completed)   Primary osteoarthritis of right knee   Relevant Medications   diclofenac sodium (VOLTAREN) 1 % GEL   Right leg swelling   Relevant Medications   furosemide (LASIX) 40 MG tablet    Other Visit Diagnoses    Low serum potassium level       Relevant Medications   potassium chloride SA (K-DUR) 20 MEQ tablet   Other Relevant Orders   Comprehensive metabolic panel (Completed)      Follow-up: Return in about 6 months (around 11/10/2019).  Donato SchultzYvonne R Lowne Chase, DO

## 2019-05-10 NOTE — Assessment & Plan Note (Signed)
Encouraged heart healthy diet, increase exercise, avoid trans fats, consider a krill oil cap daily 

## 2019-05-10 NOTE — Assessment & Plan Note (Signed)
Well controlled, no changes to meds. Encouraged heart healthy diet such as the DASH diet and exercise as tolerated.  °

## 2019-05-10 NOTE — Patient Instructions (Signed)
Cholesterol Content in Foods Cholesterol is a waxy, fat-like substance that helps to carry fat in the blood. The body needs cholesterol in small amounts, but too much cholesterol can cause damage to the arteries and heart. Most people should eat less than 200 milligrams (mg) of cholesterol a day. Foods with cholesterol  Cholesterol is found in animal-based foods, such as meat, seafood, and dairy. Generally, low-fat dairy and lean meats have less cholesterol than full-fat dairy and fatty meats. The milligrams of cholesterol per serving (mg per serving) of common cholesterol-containing foods are listed below. Meat and other proteins  Egg - one large whole egg has 186 mg.  Veal shank - 4 oz has 141 mg.  Lean ground turkey (93% lean) - 4 oz has 118 mg.  Fat-trimmed lamb loin - 4 oz has 106 mg.  Lean ground beef (90% lean) - 4 oz has 100 mg.  Lobster - 3.5 oz has 90 mg.  Pork loin chops - 4 oz has 86 mg.  Canned salmon - 3.5 oz has 83 mg.  Fat-trimmed beef top loin - 4 oz has 78 mg.  Frankfurter - 1 frank (3.5 oz) has 77 mg.  Crab - 3.5 oz has 71 mg.  Roasted chicken without skin, white meat - 4 oz has 66 mg.  Light bologna - 2 oz has 45 mg.  Deli-cut turkey - 2 oz has 31 mg.  Canned tuna - 3.5 oz has 31 mg.  Bacon - 1 oz has 29 mg.  Oysters and mussels (raw) - 3.5 oz has 25 mg.  Mackerel - 1 oz has 22 mg.  Trout - 1 oz has 20 mg.  Pork sausage - 1 link (1 oz) has 17 mg.  Salmon - 1 oz has 16 mg.  Tilapia - 1 oz has 14 mg. Dairy  Soft-serve ice cream -  cup (4 oz) has 103 mg.  Whole-milk yogurt - 1 cup (8 oz) has 29 mg.  Cheddar cheese - 1 oz has 28 mg.  American cheese - 1 oz has 28 mg.  Whole milk - 1 cup (8 oz) has 23 mg.  2% milk - 1 cup (8 oz) has 18 mg.  Cream cheese - 1 tablespoon (Tbsp) has 15 mg.  Cottage cheese -  cup (4 oz) has 14 mg.  Low-fat (1%) milk - 1 cup (8 oz) has 10 mg.  Sour cream - 1 Tbsp has 8.5 mg.  Low-fat yogurt - 1 cup  (8 oz) has 8 mg.  Nonfat Greek yogurt - 1 cup (8 oz) has 7 mg.  Half-and-half cream - 1 Tbsp has 5 mg. Fats and oils  Cod liver oil - 1 tablespoon (Tbsp) has 82 mg.  Butter - 1 Tbsp has 15 mg.  Lard - 1 Tbsp has 14 mg.  Bacon grease - 1 Tbsp has 14 mg.  Mayonnaise - 1 Tbsp has 5-10 mg.  Margarine - 1 Tbsp has 3-10 mg. Exact amounts of cholesterol in these foods may vary depending on specific ingredients and brands. Foods without cholesterol Most plant-based foods do not have cholesterol unless you combine them with a food that has cholesterol. Foods without cholesterol include:  Grains and cereals.  Vegetables.  Fruits.  Vegetable oils, such as olive, canola, and sunflower oil.  Legumes, such as peas, beans, and lentils.  Nuts and seeds.  Egg whites. Summary  The body needs cholesterol in small amounts, but too much cholesterol can cause damage to the arteries and heart.    Most people should eat less than 200 milligrams (mg) of cholesterol a day. This information is not intended to replace advice given to you by your health care provider. Make sure you discuss any questions you have with your health care provider. Document Released: 05/30/2017 Document Revised: 09/15/2017 Document Reviewed: 05/30/2017 Elsevier Patient Education  2020 [employer]  

## 2019-07-23 ENCOUNTER — Ambulatory Visit: Payer: Medicare Other | Admitting: *Deleted

## 2019-09-30 ENCOUNTER — Emergency Department (HOSPITAL_COMMUNITY): Payer: Medicare Other

## 2019-09-30 ENCOUNTER — Emergency Department (HOSPITAL_COMMUNITY): Payer: Medicare Other | Admitting: Certified Registered Nurse Anesthetist

## 2019-09-30 ENCOUNTER — Encounter (HOSPITAL_COMMUNITY)
Admission: EM | Disposition: A | Discharge status: Home Health | Payer: Self-pay | Source: Home / Self Care | Attending: Neurology

## 2019-09-30 ENCOUNTER — Encounter (HOSPITAL_COMMUNITY): Payer: Self-pay | Admitting: Radiology

## 2019-09-30 ENCOUNTER — Inpatient Hospital Stay (HOSPITAL_COMMUNITY): Payer: Medicare Other

## 2019-09-30 ENCOUNTER — Other Ambulatory Visit: Payer: Self-pay

## 2019-09-30 ENCOUNTER — Inpatient Hospital Stay (HOSPITAL_COMMUNITY)
Admission: EM | Admit: 2019-09-30 | Discharge: 2019-10-02 | DRG: 024 | Disposition: A | Discharge status: Home Health | Payer: Medicare Other | Attending: Neurology | Admitting: Neurology

## 2019-09-30 DIAGNOSIS — I671 Cerebral aneurysm, nonruptured: Secondary | ICD-10-CM | POA: Diagnosis present

## 2019-09-30 DIAGNOSIS — D62 Acute posthemorrhagic anemia: Secondary | ICD-10-CM | POA: Diagnosis not present

## 2019-09-30 DIAGNOSIS — R471 Dysarthria and anarthria: Secondary | ICD-10-CM | POA: Diagnosis present

## 2019-09-30 DIAGNOSIS — R29713 NIHSS score 13: Secondary | ICD-10-CM | POA: Diagnosis present

## 2019-09-30 DIAGNOSIS — I639 Cerebral infarction, unspecified: Secondary | ICD-10-CM

## 2019-09-30 DIAGNOSIS — Z7982 Long term (current) use of aspirin: Secondary | ICD-10-CM

## 2019-09-30 DIAGNOSIS — I480 Paroxysmal atrial fibrillation: Secondary | ICD-10-CM | POA: Diagnosis present

## 2019-09-30 DIAGNOSIS — Z7401 Bed confinement status: Secondary | ICD-10-CM | POA: Diagnosis not present

## 2019-09-30 DIAGNOSIS — I340 Nonrheumatic mitral (valve) insufficiency: Secondary | ICD-10-CM | POA: Diagnosis not present

## 2019-09-30 DIAGNOSIS — M1711 Unilateral primary osteoarthritis, right knee: Secondary | ICD-10-CM | POA: Diagnosis present

## 2019-09-30 DIAGNOSIS — R413 Other amnesia: Secondary | ICD-10-CM | POA: Diagnosis present

## 2019-09-30 DIAGNOSIS — I63411 Cerebral infarction due to embolism of right middle cerebral artery: Secondary | ICD-10-CM | POA: Diagnosis present

## 2019-09-30 DIAGNOSIS — I10 Essential (primary) hypertension: Secondary | ICD-10-CM | POA: Diagnosis present

## 2019-09-30 DIAGNOSIS — N1831 Chronic kidney disease, stage 3a: Secondary | ICD-10-CM | POA: Diagnosis present

## 2019-09-30 DIAGNOSIS — Z8261 Family history of arthritis: Secondary | ICD-10-CM

## 2019-09-30 DIAGNOSIS — R414 Neurologic neglect syndrome: Secondary | ICD-10-CM | POA: Diagnosis present

## 2019-09-30 DIAGNOSIS — I361 Nonrheumatic tricuspid (valve) insufficiency: Secondary | ICD-10-CM

## 2019-09-30 DIAGNOSIS — Z79890 Hormone replacement therapy (postmenopausal): Secondary | ICD-10-CM

## 2019-09-30 DIAGNOSIS — E039 Hypothyroidism, unspecified: Secondary | ICD-10-CM | POA: Diagnosis present

## 2019-09-30 DIAGNOSIS — Z20828 Contact with and (suspected) exposure to other viral communicable diseases: Secondary | ICD-10-CM | POA: Diagnosis present

## 2019-09-30 DIAGNOSIS — R531 Weakness: Secondary | ICD-10-CM | POA: Diagnosis present

## 2019-09-30 DIAGNOSIS — I6601 Occlusion and stenosis of right middle cerebral artery: Secondary | ICD-10-CM

## 2019-09-30 DIAGNOSIS — Z8249 Family history of ischemic heart disease and other diseases of the circulatory system: Secondary | ICD-10-CM | POA: Diagnosis not present

## 2019-09-30 DIAGNOSIS — G510 Bell's palsy: Secondary | ICD-10-CM | POA: Diagnosis present

## 2019-09-30 DIAGNOSIS — R278 Other lack of coordination: Secondary | ICD-10-CM | POA: Diagnosis present

## 2019-09-30 DIAGNOSIS — E785 Hyperlipidemia, unspecified: Secondary | ICD-10-CM | POA: Diagnosis present

## 2019-09-30 DIAGNOSIS — N1832 Chronic kidney disease, stage 3b: Secondary | ICD-10-CM | POA: Diagnosis not present

## 2019-09-30 DIAGNOSIS — H518 Other specified disorders of binocular movement: Secondary | ICD-10-CM | POA: Diagnosis present

## 2019-09-30 DIAGNOSIS — Z79899 Other long term (current) drug therapy: Secondary | ICD-10-CM

## 2019-09-30 DIAGNOSIS — E7800 Pure hypercholesterolemia, unspecified: Secondary | ICD-10-CM | POA: Diagnosis not present

## 2019-09-30 DIAGNOSIS — Z833 Family history of diabetes mellitus: Secondary | ICD-10-CM

## 2019-09-30 HISTORY — PX: RADIOLOGY WITH ANESTHESIA: SHX6223

## 2019-09-30 HISTORY — PX: IR ANGIO VERTEBRAL SEL SUBCLAVIAN INNOMINATE UNI R MOD SED: IMG5365

## 2019-09-30 HISTORY — PX: IR CT HEAD LTD: IMG2386

## 2019-09-30 HISTORY — PX: IR PERCUTANEOUS ART THROMBECTOMY/INFUSION INTRACRANIAL INC DIAG ANGIO: IMG6087

## 2019-09-30 LAB — RAPID URINE DRUG SCREEN, HOSP PERFORMED
Amphetamines: NOT DETECTED
Barbiturates: NOT DETECTED
Benzodiazepines: NOT DETECTED
Cocaine: NOT DETECTED
Opiates: NOT DETECTED
Tetrahydrocannabinol: NOT DETECTED

## 2019-09-30 LAB — I-STAT CHEM 8, ED
BUN: 33 mg/dL — ABNORMAL HIGH (ref 8–23)
Calcium, Ion: 0.98 mmol/L — ABNORMAL LOW (ref 1.15–1.40)
Chloride: 108 mmol/L (ref 98–111)
Creatinine, Ser: 1.6 mg/dL — ABNORMAL HIGH (ref 0.44–1.00)
Glucose, Bld: 110 mg/dL — ABNORMAL HIGH (ref 70–99)
HCT: 38 % (ref 36.0–46.0)
Hemoglobin: 12.9 g/dL (ref 12.0–15.0)
Potassium: 4.1 mmol/L (ref 3.5–5.1)
Sodium: 138 mmol/L (ref 135–145)
TCO2: 26 mmol/L (ref 22–32)

## 2019-09-30 LAB — CBC
HCT: 40.8 % (ref 36.0–46.0)
Hemoglobin: 13.1 g/dL (ref 12.0–15.0)
MCH: 31 pg (ref 26.0–34.0)
MCHC: 32.1 g/dL (ref 30.0–36.0)
MCV: 96.7 fL (ref 80.0–100.0)
Platelets: 233 10*3/uL (ref 150–400)
RBC: 4.22 MIL/uL (ref 3.87–5.11)
RDW: 13.2 % (ref 11.5–15.5)
WBC: 6.9 10*3/uL (ref 4.0–10.5)
nRBC: 0 % (ref 0.0–0.2)

## 2019-09-30 LAB — DIFFERENTIAL
Abs Immature Granulocytes: 0.03 10*3/uL (ref 0.00–0.07)
Basophils Absolute: 0 10*3/uL (ref 0.0–0.1)
Basophils Relative: 0 %
Eosinophils Absolute: 0 10*3/uL (ref 0.0–0.5)
Eosinophils Relative: 0 %
Immature Granulocytes: 0 %
Lymphocytes Relative: 16 %
Lymphs Abs: 1.1 10*3/uL (ref 0.7–4.0)
Monocytes Absolute: 0.6 10*3/uL (ref 0.1–1.0)
Monocytes Relative: 9 %
Neutro Abs: 5.1 10*3/uL (ref 1.7–7.7)
Neutrophils Relative %: 75 %

## 2019-09-30 LAB — URINALYSIS, ROUTINE W REFLEX MICROSCOPIC
Bilirubin Urine: NEGATIVE
Glucose, UA: NEGATIVE mg/dL
Ketones, ur: NEGATIVE mg/dL
Leukocytes,Ua: NEGATIVE
Nitrite: NEGATIVE
Protein, ur: NEGATIVE mg/dL
Specific Gravity, Urine: 1.006 (ref 1.005–1.030)
pH: 6 (ref 5.0–8.0)

## 2019-09-30 LAB — COMPREHENSIVE METABOLIC PANEL
ALT: 13 U/L (ref 0–44)
AST: 23 U/L (ref 15–41)
Albumin: 3.9 g/dL (ref 3.5–5.0)
Alkaline Phosphatase: 104 U/L (ref 38–126)
Anion gap: 12 (ref 5–15)
BUN: 24 mg/dL — ABNORMAL HIGH (ref 8–23)
CO2: 22 mmol/L (ref 22–32)
Calcium: 9.2 mg/dL (ref 8.9–10.3)
Chloride: 107 mmol/L (ref 98–111)
Creatinine, Ser: 1.77 mg/dL — ABNORMAL HIGH (ref 0.44–1.00)
GFR calc Af Amer: 30 mL/min — ABNORMAL LOW (ref >60)
GFR calc non Af Amer: 26 mL/min — ABNORMAL LOW (ref >60)
Glucose, Bld: 113 mg/dL — ABNORMAL HIGH (ref 70–99)
Potassium: 3.9 mmol/L (ref 3.5–5.1)
Sodium: 141 mmol/L (ref 135–145)
Total Bilirubin: 0.8 mg/dL (ref 0.3–1.2)
Total Protein: 6.6 g/dL (ref 6.5–8.1)

## 2019-09-30 LAB — RESPIRATORY PANEL BY RT PCR (FLU A&B, COVID)
Influenza A by PCR: NEGATIVE
Influenza B by PCR: NEGATIVE
SARS Coronavirus 2 by RT PCR: NEGATIVE

## 2019-09-30 LAB — ECHOCARDIOGRAM COMPLETE
Height: 64 in
Weight: 2384 oz

## 2019-09-30 LAB — ETHANOL: Alcohol, Ethyl (B): <10 mg/dL (ref <10)

## 2019-09-30 LAB — APTT: aPTT: 25 seconds (ref 24–36)

## 2019-09-30 LAB — PROTIME-INR
INR: 1 (ref 0.8–1.2)
Prothrombin Time: 13.2 seconds (ref 11.4–15.2)

## 2019-09-30 LAB — MRSA PCR SCREENING: MRSA by PCR: NEGATIVE

## 2019-09-30 SURGERY — IR WITH ANESTHESIA
Anesthesia: General

## 2019-09-30 MED ORDER — LABETALOL HCL 5 MG/ML IV SOLN: 10.0000 mg | INTRAVENOUS | Status: DC | PRN

## 2019-09-30 MED ORDER — CLOPIDOGREL BISULFATE 300 MG PO TABS
ORAL_TABLET | ORAL | Status: AC
  Filled 2019-09-30: qty 1

## 2019-09-30 MED ORDER — TIROFIBAN HCL IN NACL 5-0.9 MG/100ML-% IV SOLN
INTRAVENOUS | Status: AC
  Filled 2019-09-30: qty 100

## 2019-09-30 MED ORDER — FENTANYL CITRATE (PF) 100 MCG/2ML IJ SOLN
INTRAMUSCULAR | Status: AC
  Filled 2019-09-30: qty 2

## 2019-09-30 MED ORDER — ACETAMINOPHEN 650 MG RE SUPP: 650.0000 mg | RECTAL | Status: DC | PRN

## 2019-09-30 MED ORDER — ACETAMINOPHEN 325 MG PO TABS: 650.0000 mg | ORAL_TABLET | ORAL | Status: DC | PRN

## 2019-09-30 MED ORDER — PHENYLEPHRINE HCL-NACL 10-0.9 MG/250ML-% IV SOLN
INTRAVENOUS | Status: DC | PRN
  Administered 2019-09-30: 60 ug/min via INTRAVENOUS

## 2019-09-30 MED ORDER — ROCURONIUM BROMIDE 50 MG/5ML IV SOSY
PREFILLED_SYRINGE | INTRAVENOUS | Status: DC | PRN
  Administered 2019-09-30: 50 mg via INTRAVENOUS

## 2019-09-30 MED ORDER — ASPIRIN EC 325 MG PO TBEC
325.0000 mg | DELAYED_RELEASE_TABLET | Freq: Every day | ORAL | Status: DC
  Administered 2019-09-30 – 2019-10-02 (×3): 325 mg via ORAL
  Filled 2019-09-30 (×3): qty 1

## 2019-09-30 MED ORDER — DEXAMETHASONE SODIUM PHOSPHATE 10 MG/ML IJ SOLN
INTRAMUSCULAR | Status: DC | PRN
  Administered 2019-09-30: 8 mg via INTRAVENOUS

## 2019-09-30 MED ORDER — ONDANSETRON HCL 4 MG/2ML IJ SOLN: 4.0000 mg | Freq: Four times a day (QID) | INTRAMUSCULAR | Status: DC | PRN

## 2019-09-30 MED ORDER — STROKE: EARLY STAGES OF RECOVERY BOOK
Freq: Once | Status: DC
  Filled 2019-09-30: qty 1

## 2019-09-30 MED ORDER — CLEVIDIPINE BUTYRATE 0.5 MG/ML IV EMUL
0.0000 mg/h | INTRAVENOUS | Status: DC
  Administered 2019-09-30: 2 mg/h via INTRAVENOUS
  Filled 2019-09-30: qty 50

## 2019-09-30 MED ORDER — CLOPIDOGREL BISULFATE 75 MG PO TABS
75.0000 mg | ORAL_TABLET | Freq: Every day | ORAL | Status: DC
  Administered 2019-10-01 – 2019-10-02 (×2): 75 mg via ORAL
  Filled 2019-09-30 (×2): qty 1

## 2019-09-30 MED ORDER — SODIUM CHLORIDE 0.9 % IV SOLN
INTRAVENOUS | Status: DC
  Administered 2019-09-30: 12:00:00 via INTRAVENOUS

## 2019-09-30 MED ORDER — CEFAZOLIN SODIUM-DEXTROSE 2-4 GM/100ML-% IV SOLN
INTRAVENOUS | Status: AC
  Filled 2019-09-30: qty 100

## 2019-09-30 MED ORDER — ACETAMINOPHEN 160 MG/5ML PO SOLN: 650.0000 mg | ORAL | Status: DC | PRN

## 2019-09-30 MED ORDER — LEVOTHYROXINE SODIUM 75 MCG PO TABS
75.0000 ug | ORAL_TABLET | Freq: Every day | ORAL | Status: DC
  Administered 2019-10-01: 75 ug via ORAL
  Filled 2019-09-30 (×2): qty 1

## 2019-09-30 MED ORDER — FENTANYL CITRATE (PF) 100 MCG/2ML IJ SOLN
INTRAMUSCULAR | Status: DC | PRN
  Administered 2019-09-30: 50 ug via INTRAVENOUS

## 2019-09-30 MED ORDER — HYDRALAZINE HCL 20 MG/ML IJ SOLN
10.0000 mg | INTRAMUSCULAR | Status: DC | PRN
  Administered 2019-10-01: 04:00:00 10 mg via INTRAVENOUS
  Filled 2019-09-30: qty 1

## 2019-09-30 MED ORDER — ONDANSETRON HCL 4 MG/2ML IJ SOLN
INTRAMUSCULAR | Status: DC | PRN
  Administered 2019-09-30: 4 mg via INTRAVENOUS

## 2019-09-30 MED ORDER — CEFAZOLIN SODIUM-DEXTROSE 2-3 GM-%(50ML) IV SOLR
INTRAVENOUS | Status: DC | PRN
  Administered 2019-09-30: 2 g via INTRAVENOUS

## 2019-09-30 MED ORDER — NITROGLYCERIN 1 MG/10 ML FOR IR/CATH LAB
INTRA_ARTERIAL | Status: AC
  Filled 2019-09-30: qty 10

## 2019-09-30 MED ORDER — IOHEXOL 350 MG/ML SOLN
100.0000 mL | Freq: Once | INTRAVENOUS | Status: AC | PRN
  Administered 2019-09-30: 09:00:00 100 mL via INTRAVENOUS

## 2019-09-30 MED ORDER — ACETAMINOPHEN 325 MG PO TABS
650.0000 mg | ORAL_TABLET | ORAL | Status: DC | PRN
  Administered 2019-09-30: 650 mg via ORAL
  Filled 2019-09-30: qty 2

## 2019-09-30 MED ORDER — SENNOSIDES-DOCUSATE SODIUM 8.6-50 MG PO TABS: 1.0000 | ORAL_TABLET | Freq: Every evening | ORAL | Status: DC | PRN

## 2019-09-30 MED ORDER — PRAVASTATIN SODIUM 40 MG PO TABS
40.0000 mg | ORAL_TABLET | Freq: Every day | ORAL | Status: DC
  Administered 2019-09-30: 18:00:00 40 mg via ORAL
  Filled 2019-09-30: qty 1

## 2019-09-30 MED ORDER — NITROGLYCERIN 1 MG/10 ML FOR IR/CATH LAB
INTRA_ARTERIAL | Status: DC | PRN
  Administered 2019-09-30 (×2): 25 ug via INTRA_ARTERIAL

## 2019-09-30 MED ORDER — ACETAMINOPHEN 650 MG RE SUPP
650.0000 mg | RECTAL | Status: DC | PRN
  Administered 2019-09-30: 12:00:00 650 mg via RECTAL
  Filled 2019-09-30: qty 1

## 2019-09-30 MED ORDER — TICAGRELOR 90 MG PO TABS
ORAL_TABLET | ORAL | Status: AC
  Filled 2019-09-30: qty 2

## 2019-09-30 MED ORDER — SUCCINYLCHOLINE CHLORIDE 200 MG/10ML IV SOSY
PREFILLED_SYRINGE | INTRAVENOUS | Status: DC | PRN
  Administered 2019-09-30: 100 mg via INTRAVENOUS

## 2019-09-30 MED ORDER — SODIUM CHLORIDE 0.9 % IV SOLN
INTRAVENOUS | Status: DC | PRN
  Administered 2019-09-30: 09:00:00 via INTRAVENOUS

## 2019-09-30 MED ORDER — IOHEXOL 300 MG/ML  SOLN
150.0000 mL | Freq: Once | INTRAMUSCULAR | Status: AC | PRN
  Administered 2019-09-30: 5 mL via INTRA_ARTERIAL

## 2019-09-30 MED ORDER — SODIUM CHLORIDE 0.9 % IV SOLN: INTRAVENOUS | Status: DC

## 2019-09-30 MED ORDER — PHENYLEPHRINE 40 MCG/ML (10ML) SYRINGE FOR IV PUSH (FOR BLOOD PRESSURE SUPPORT)
PREFILLED_SYRINGE | INTRAVENOUS | Status: DC | PRN
  Administered 2019-09-30: 40 ug via INTRAVENOUS

## 2019-09-30 MED ORDER — EPTIFIBATIDE 20 MG/10ML IV SOLN
INTRAVENOUS | Status: AC
  Filled 2019-09-30: qty 10

## 2019-09-30 MED ORDER — LIDOCAINE 2% (20 MG/ML) 5 ML SYRINGE
INTRAMUSCULAR | Status: DC | PRN
  Administered 2019-09-30 (×2): 40 mg via INTRAVENOUS

## 2019-09-30 MED ORDER — IOHEXOL 300 MG/ML  SOLN
150.0000 mL | Freq: Once | INTRAMUSCULAR | Status: AC | PRN
  Administered 2019-09-30: 45 mL via INTRA_ARTERIAL

## 2019-09-30 MED ORDER — SUGAMMADEX SODIUM 200 MG/2ML IV SOLN
INTRAVENOUS | Status: DC | PRN
  Administered 2019-09-30: 300 mg via INTRAVENOUS

## 2019-09-30 MED ORDER — PROPOFOL 10 MG/ML IV BOLUS
INTRAVENOUS | Status: DC | PRN
  Administered 2019-09-30: 20 mg via INTRAVENOUS
  Administered 2019-09-30: 80 mg via INTRAVENOUS
  Administered 2019-09-30 (×2): 20 mg via INTRAVENOUS

## 2019-09-30 MED ORDER — CHLORHEXIDINE GLUCONATE CLOTH 2 % EX PADS
6.0000 | MEDICATED_PAD | Freq: Every day | CUTANEOUS | Status: DC
  Administered 2019-09-30 – 2019-10-01 (×2): 6 via TOPICAL

## 2019-09-30 NOTE — ED Provider Notes (Signed)
[facility] EMERGENCY DEPARTMENT Provider Note   CSN: [id number] Arrival date & time: 09/30/19  0759     History Chief Complaint  Patient presents with  . Altered Mental Status  . Weakness    Belinda Anderson is a 85 y.o. female.  HPI Patient came in by EMS.  Reportedly daughter called patient's morning of slurred speech.  Last normal at 8:00 last night.  Reportedly had facial droop for EMS.  However upon arrival nurse noticed other left-sided deficits.  She got me I evaluate her and immediately called a code stroke.  Patient states she feels a little off.  Some slurred speech.  However has left-sided facial droop eyes will not cross to the left and has dysmetria on the left arm and left leg along with some decreased strength.  Has previously known PICA aneurysm.  CBG reassuring for EMS.  Patient does appear to have some mild confusion.    Past Medical History:  Diagnosis Date  . Arthritis   . Hypertension   . Thyroid disease     Patient Active Problem List   Diagnosis Date Noted  . Contusion of foot including toes, right, initial encounter 08/10/2018  . Primary osteoarthritis of right knee 07/31/2018  . Right leg swelling 07/17/2018  . Pain of right calf 07/17/2018  . Preventative health care 09/18/2017  . Low back pain radiating to left leg 11/28/2016  . Hyperlipidemia 02/02/2015  . Sciatica 07/16/2014  . Knee pain, acute 07/16/2014  . Lower extremity pain, right 07/16/2014  . Skin infection 07/16/2014  . Incontinence of urine in female 06/19/2013  . Cerebral aneurysm rupture (HCC) 04/08/2013  . LEG EDEMA, RIGHT 07/06/2010  . OTHER DRUG ALLERGY 07/06/2010  . DIARRHEA 02/12/2010  . HEMATURIA, HX OF 11/24/2009  . ANEURYSM, HX OF 08/03/2009  . B12 DEFICIENCY 07/24/2009  . 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 SYNCOPE 07/20/2009  . DIZZINESS 07/20/2009  . 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  Hypothyroidism 12/14/2007  . Essential hypertension 12/14/2007  . DIVERTICULITIS, HX OF 12/14/2007  . GOITER 01/30/2007  . HYPERTENSION, BENIGN 01/30/2007  . UTERINE POLYP 01/30/2007  . THYROIDECTOMY, HX OF 01/30/2007    Past Surgical History:  Procedure Laterality Date  . EYE SURGERY     lens implant     OB History   No obstetric history on file.     Family History  Problem Relation Age of Onset  . Arthritis Mother   . Heart disease Mother   . Heart disease Father 52       MI  . Coronary artery disease Other   . Diabetes Brother        DI    Social History   Tobacco Use  . Smoking status: Never Smoker  . Smokeless tobacco: Never Used  Substance Use Topics  . Alcohol use: No  . Drug use: No    Home Medications Prior to Admission medications   Medication Sig Start Date End Date Taking? Authorizing Provider  Acetaminophen (TYLENOL ARTHRITIS PAIN PO) Take 2 tablets by mouth every 6 (six) hours as needed (pain).     [provider]  amLODipine (NORVASC) 2.5 MG tablet TAKE 1 TABLET BY MOUTH ONCE DAILY 05/10/19   [name], Grayling Congress, DO  aspirin EC 325 MG EC tablet Take 325 mg by mouth daily.      [provider]  diclofenac sodium (VOLTAREN) 1 % GEL Apply 4 g topically  4 (four) times daily. 05/10/19   [name], DO  furosemide (LASIX) 40 MG tablet Take 1 tablet (40 mg total) by mouth daily. 05/10/19   [name], DO  levothyroxine (SYNTHROID) 75 MCG tablet TAKE 1 TABLET(75 MCG) BY MOUTH DAILY 05/10/19   [name], Kendrick Fries R, DO  NONFORMULARY OR COMPOUNDED ITEM Compression stockings 20-30 mm hg  #1  Dx edema 12/16/14   [name], [name], DO  potassium chloride SA (K-DUR) 20 MEQ tablet Take 1 tablet (20 mEq total) by mouth daily. 05/10/19   Roma Schanz R, DO  pravastatin (PRAVACHOL) 40 MG tablet TAKE 1 TABLET(40 MG) BY MOUTH DAILY 05/10/19   [name], DO    Allergies    Patient has no known  allergies.  Review of Systems   Review of Systems  Unable to perform ROS: Acuity of condition    Physical Exam Updated Vital Signs BP (!) 152/66 (BP Location: Right Arm)   Pulse 82   Temp 98.6 F (37 C) (Oral)   Resp 18   Ht 5\' 6"  (1.676 m)   Wt 67.6 kg   SpO2 96%   BMI 24.05 kg/m   Physical Exam Vitals and nursing note reviewed.  HENT:     Head: Atraumatic.  Eyes:     Comments: Eyes will not cross to the left.  Cardiovascular:     Rate and Rhythm: Regular rhythm.  Pulmonary:     Breath sounds: No wheezing or rhonchi.  Abdominal:     Tenderness: There is no abdominal tenderness.  Musculoskeletal:        General: No tenderness.     Cervical back: Neck supple.  Skin:    General: Skin is warm.  Neurological:     Mental Status: She is alert.     Comments: Patient is awake and pleasant but some mild confusion.  Left-sided facial droop with forehead involvement.  Eyes will not cross the left.  Is able to speak however.  Some degree strength in left side and dysmetria.  Complete NIH scoring done by neurology.     ED Results / Procedures / Treatments   Labs (all labs ordered are listed, but only abnormal results are displayed) Labs Reviewed  COMPREHENSIVE METABOLIC PANEL - Abnormal; Notable for the following components:      Result Value   Glucose, Bld 113 (*)    BUN 24 (*)    Creatinine, Ser 1.77 (*)    GFR calc non Af Amer 26 (*)    GFR calc Af Amer 30 (*)    All other components within normal limits  I-STAT CHEM 8, ED - Abnormal; Notable for the following components:   BUN 33 (*)    Creatinine, Ser 1.60 (*)    Glucose, Bld 110 (*)    Calcium, Ion 0.98 (*)    All other components within normal limits  RESPIRATORY PANEL BY RT PCR (FLU A&B, COVID)  PROTIME-INR  APTT  CBC  DIFFERENTIAL  ETHANOL  RAPID URINE DRUG SCREEN, HOSP PERFORMED  URINALYSIS, ROUTINE W REFLEX MICROSCOPIC    EKG None ED ECG REPORT   Date: 09/30/2019  Rate: 84  Rhythm: normal  sinus rhythm  QRS Axis: normal  Intervals: normal  ST/T Wave abnormalities: nonspecific ST/T changes  Conduction Disutrbances:right bundle branch block  Narrative Interpretation:    Radiology CT Angio Head W or Wo Contrast  Result Date: 09/30/2019 CLINICAL DATA:  Facial droop and left-sided weakness  EXAM: CT ANGIOGRAPHY HEAD AND NECK CT PERFUSION BRAIN TECHNIQUE: Multidetector CT imaging of the head and neck was performed using the standard protocol during bolus administration of intravenous contrast. Multiplanar CT image reconstructions and MIPs were obtained to evaluate the vascular anatomy. Carotid stenosis measurements (when applicable) are obtained utilizing NASCET criteria, using the distal internal carotid diameter as the denominator. Multiphase CT imaging of the brain was performed following IV bolus contrast injection. Subsequent parametric perfusion maps were calculated using RAPID software. CONTRAST:  OMNIPAQUE IOHEXOL 350 MG/ML SOLN COMPARISON:  Head CT earlier today.  Brain MRI 06/27/2013 FINDINGS: CTA NECK FINDINGS Aortic arch: 2 vessel branching. Right carotid system: Atheromatous wall thickening of the common carotid to the level of the bifurcation. No flow limiting stenosis or ulceration. Negative for beading. Left carotid system: Calcified plaque at the bifurcation that is mild. No stenosis or ulceration. Vertebral arteries: Mild subclavian atherosclerosis on the right, more moderate on the left, without flow limiting stenosis or ulceration. Mild-to-moderate atheromatous narrowing at the left vertebral origin due to calcified plaque. Skeleton: Diffuse degenerative disease of the cervical spine. Other neck: No acute or aggressive finding. Postoperative right thyroid. Upper chest: Biapical pleural based scarring Review of the MIP images confirms the above findings CTA HEAD FINDINGS Anterior circulation: Mild atherosclerotic plaque along the carotid siphons. There is a severe right  M2 branch occlusion at the level of the insula with the downstream distal right M3 branch occlusion. Atherosclerotic irregularity of medium size vessels. Negative for aneurysm. Bilateral early branching MCA. Posterior circulation: Fenestrated left V4 segment and proximal basilar. 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 Symmetric flow in the posterior cerebral arteries. Venous sinuses: Negative Anatomic variants: As above Review of the MIP images confirms the above findings CT Brain Perfusion Findings: ASPECTS: 10 CBF (<30%) Volume: 0mL Perfusion (Tmax>6.0s) volume: 13mL These results were called by telephone at the time of interpretation on 09/30/2019 at 9:01 am to provider Arther Dames , who verbally acknowledged these results. IMPRESSION: 1. 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 This is associated with 13 cc of penumbra and even greater area of relative ischemia when using the strictest T-max parameters. 2. No flow limiting stenosis or embolic source seen in the more proximal neck. 3. 4 mm right PICA aneurysm. The left V4 segment and basilar are fenestrated. Electronically Signed   By: Marnee Spring M.D.   On: 09/30/2019 09:10   CT ANGIO NECK W OR WO CONTRAST  Result Date: 09/30/2019 CLINICAL DATA:  Facial droop and left-sided weakness EXAM: CT ANGIOGRAPHY HEAD AND NECK CT PERFUSION BRAIN TECHNIQUE: Multidetector CT imaging of the head and neck was performed using the standard protocol during bolus administration of intravenous contrast. Multiplanar CT image reconstructions and MIPs were obtained to evaluate the vascular anatomy. Carotid stenosis measurements (when applicable) are obtained utilizing NASCET criteria, using the distal internal carotid diameter as the denominator. Multiphase CT imaging of the brain was performed following IV bolus contrast injection. Subsequent parametric perfusion maps were  calculated using RAPID software. CONTRAST:  OMNIPAQUE IOHEXOL 350 MG/ML SOLN COMPARISON:  Head CT earlier today.  Brain MRI 06/27/2013 FINDINGS: CTA NECK FINDINGS Aortic arch: 2 vessel branching. Right carotid system: Atheromatous wall thickening of the common carotid to the level of the bifurcation. No flow limiting stenosis or ulceration. Negative for beading. Left carotid system: Calcified plaque at the bifurcation that is mild. No stenosis or  ulceration. Vertebral arteries: Mild subclavian atherosclerosis on the right, more moderate on the left, without flow limiting stenosis or ulceration. Mild-to-moderate atheromatous narrowing at the left vertebral origin due to calcified plaque. Skeleton: Diffuse degenerative disease of the cervical spine. Other neck: No acute or aggressive finding. Postoperative right thyroid. Upper chest: Biapical pleural based scarring Review of the MIP images confirms the above findings CTA HEAD FINDINGS Anterior circulation: Mild atherosclerotic plaque along the carotid siphons. There is a severe right M2 branch occlusion at the level of the insula with the downstream distal right M3 branch occlusion. Atherosclerotic irregularity of medium size vessels. Negative for aneurysm. Bilateral early branching MCA. Posterior circulation: Fenestrated left V4 segment and proximal basilar. 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 Symmetric flow in the posterior cerebral arteries. Venous sinuses: Negative Anatomic variants: As above Review of the MIP images confirms the above findings CT Brain Perfusion Findings: ASPECTS: 10 CBF (<30%) Volume: 0mL Perfusion (Tmax>6.0s) volume: 13mL These results were called by telephone at the time of interpretation on 09/30/2019 at 9:01 am to provider Arther Dames , who verbally acknowledged these results. IMPRESSION: 1. 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 This  is associated with 13 cc of penumbra and even greater area of relative ischemia when using the strictest T-max parameters. 2. No flow limiting stenosis or embolic source seen in the more proximal neck. 3. 4 mm right PICA aneurysm. The left V4 segment and basilar are fenestrated. Electronically Signed   By: Marnee Spring M.D.   On: 09/30/2019 09:10   CT CEREBRAL PERFUSION W CONTRAST  Result Date: 09/30/2019 CLINICAL DATA:  Facial droop and left-sided weakness EXAM: CT ANGIOGRAPHY HEAD AND NECK CT PERFUSION BRAIN TECHNIQUE: Multidetector CT imaging of the head and neck was performed using the standard protocol during bolus administration of intravenous contrast. Multiplanar CT image reconstructions and MIPs were obtained to evaluate the vascular anatomy. Carotid stenosis measurements (when applicable) are obtained utilizing NASCET criteria, using the distal internal carotid diameter as the denominator. Multiphase CT imaging of the brain was performed following IV bolus contrast injection. Subsequent parametric perfusion maps were calculated using RAPID software. CONTRAST:  OMNIPAQUE IOHEXOL 350 MG/ML SOLN COMPARISON:  Head CT earlier today.  Brain MRI 06/27/2013 FINDINGS: CTA NECK FINDINGS Aortic arch: 2 vessel branching. Right carotid system: Atheromatous wall thickening of the common carotid to the level of the bifurcation. No flow limiting stenosis or ulceration. Negative for beading. Left carotid system: Calcified plaque at the bifurcation that is mild. No stenosis or ulceration. Vertebral arteries: Mild subclavian atherosclerosis on the right, more moderate on the left, without flow limiting stenosis or ulceration. Mild-to-moderate atheromatous narrowing at the left vertebral origin due to calcified plaque. Skeleton: Diffuse degenerative disease of the cervical spine. Other neck: No acute or aggressive finding. Postoperative right thyroid. Upper chest: Biapical pleural based scarring Review of the  MIP images confirms the above findings CTA HEAD FINDINGS Anterior circulation: Mild atherosclerotic plaque along the carotid siphons. There is a severe right M2 branch occlusion at the level of the insula with the downstream distal right M3 branch occlusion. Atherosclerotic irregularity of medium size vessels. Negative for aneurysm. Bilateral early branching MCA. Posterior circulation: Fenestrated left V4 segment and proximal basilar. 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 Symmetric flow in the posterior cerebral arteries. Venous sinuses: Negative Anatomic variants: As  above Review of the MIP images confirms the above findings CT Brain Perfusion Findings: ASPECTS: 10 CBF (<30%) Volume: 0mL Perfusion (Tmax>6.0s) volume: 13mL These results were called by telephone at the time of interpretation on 09/30/2019 at 9:01 am to provider Arther DamesSUSHANTH AROOR , who verbally acknowledged these results. IMPRESSION: 1. 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 This is associated with 13 cc of penumbra and even greater area of relative ischemia when using the strictest T-max parameters. 2. No flow limiting stenosis or embolic source seen in the more proximal neck. 3. 4 mm right PICA aneurysm. The left V4 segment and basilar are fenestrated. Electronically Signed   By: Marnee SpringJonathon  Watts M.D.   On: 09/30/2019 09:10   CT HEAD CODE STROKE WO CONTRAST  Result Date: 09/30/2019 CLINICAL DATA:  Code stroke. Focal neuro deficit, greater than 6 hours, stroke suspected. Additional history provided: Facial droop, left-sided weakness, history of brain aneurysm. EXAM: CT HEAD WITHOUT CONTRAST TECHNIQUE: Contiguous axial images were obtained from the base of the skull through the vertex without intravenous contrast. COMPARISON:  MRI/MRA head 06/27/2013, head CT 04/10/2013 FINDINGS: Brain: No evidence of acute intracranial hemorrhage. No demarcated cortical  infarction. No evidence of intracranial mass. No midline shift or extra-axial fluid collection. Moderate patchy hypodensity within the cerebral white matter is nonspecific, but consistent with chronic small vessel ischemic disease. Redemonstrated chronic lacunar infarct within the inferior left basal ganglia. 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 Mild generalized parenchymal atrophy. Vascular: No hyperdense vessel or unexpected calcification. Skull: Normal. Negative for fracture or focal lesion. Sinuses/Orbits: Rightward gaze deviation. No significant paranasal sinus disease or mastoid effusion at the imaged levels. These results were communicated to Dr. Laurence SlateAroor at 8:35 amon 12/14/2020by text page via the [facility]. IMPRESSION: No CT evidence of acute intracranial abnormality. Stable generalized parenchymal atrophy and chronic small vessel ischemic disease. Redemonstrated chronic lacunar infarct within the inferior left basal ganglia. 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 Electronically Signed   By: Jackey LogeKyle  Golden DO   On: 09/30/2019 08:37    Procedures Procedures (including critical care time)  Medications Ordered in ED Medications  tirofiban (AGGRASTAT) 5-0.9 MG/100ML-% injection (has no administration in time range)  ticagrelor (BRILINTA) 90 MG tablet (has no administration in time range)  clopidogrel (PLAVIX) 300 MG tablet (has no administration in time range)  eptifibatide (INTEGRILIN) 20 MG/10ML injection (has no administration in time range)  nitroGLYCERIN 100 mcg/mL intra-arterial injection (has no administration in time range)  iohexol (OMNIPAQUE) 350 MG/ML injection 100 mL (100 mLs Intravenous Contrast Given 09/30/19 0841)    ED Course  I have reviewed the triage vital signs and the nursing notes.  Pertinent labs & imaging results that were available  during my care of the patient were reviewed by me and considered in my medical decision making (see chart for details).    MDM Rules/Calculators/A&P                       Patient came in with neuro deficits.  Last normal 8:00 last night.  However increasing deficits while in the ER.  Initial head CT reassuring but CTA and perfusion showed stenosis with lesion.  Neurology discussed with patient's daughter and patient will be taken interventional radiology.  CRITICAL CARE Performed by: Benjiman CoreNathan Bellagrace Sylvan Total critical care time: 30 minutes Critical care time was exclusive of separately billable  procedures and treating other patients. Critical care was necessary to treat or prevent imminent or life-threatening deterioration. Critical care was time spent personally by me on the following activities: development of treatment plan with patient and/or surrogate as well as nursing, discussions with consultants, evaluation of patient's response to treatment, examination of patient, obtaining history from patient or surrogate, ordering and performing treatments and interventions, ordering and review of laboratory studies, ordering and review of radiographic studies, pulse oximetry and re-evaluation of patient's condition.  Final Clinical Impression(s) / ED Diagnoses Final diagnoses:  Stroke (cerebrum) (HCC)  Cerebrovascular accident (CVA), unspecified mechanism [facility])    Rx / DC Orders ED Discharge Orders    None       Benjiman Core, MD 09/30/19 [phone]

## 2019-09-30 NOTE — Anesthesia Preprocedure Evaluation (Addendum)
Anesthesia Evaluation  Patient identified by MRN, date of birth, ID band Patient awake    Reviewed: Allergy & Precautions, NPO status , Patient's Chart, lab work & pertinent test results  History of Anesthesia Complications Negative for: history of anesthetic complications  Airway Mallampati: II  TM Distance: >3 FB Neck ROM: Full    Dental no notable dental hx.    Pulmonary neg pulmonary ROS,    Pulmonary exam normal        Cardiovascular hypertension, Pt. on medications Normal cardiovascular exam     Neuro/Psych    GI/Hepatic   Endo/Other  Hypothyroidism   Renal/GU Cr 1.6     Musculoskeletal  (+) Arthritis ,   Abdominal   Peds  Hematology   Anesthesia Other Findings   Reproductive/Obstetrics                            Anesthesia Physical Anesthesia Plan  ASA: III and emergent  Anesthesia Plan: General   Post-op Pain Management:    Induction: Intravenous  PONV Risk Score and Plan: Treatment may vary due to age or medical condition and Ondansetron  Airway Management Planned: Oral ETT  Additional Equipment:   Intra-op Plan:   Post-operative Plan: Extubation in OR  Informed Consent: I have reviewed the patients History and Physical, chart, labs and discussed the procedure including the risks, benefits and alternatives for the proposed anesthesia with the patient or authorized representative who has indicated his/her understanding and acceptance.     Dental advisory given  Plan Discussed with: CRNA  Anesthesia Plan Comments:        Anesthesia Quick Evaluation

## 2019-09-30 NOTE — Anesthesia Postprocedure Evaluation (Signed)
Anesthesia Post Note  Patient: Belinda Anderson  Procedure(s) Performed: IR WITH ANESTHESIA (N/A )     Patient location during evaluation: Other Anesthesia Type: General Level of consciousness: awake and alert Pain management: pain level controlled Vital Signs Assessment: post-procedure vital signs reviewed and stable Respiratory status: spontaneous breathing, nonlabored ventilation and respiratory function stable Cardiovascular status: blood pressure returned to baseline Postop Assessment: no apparent nausea or vomiting Anesthetic complications: no    Last Vitals:  Vitals:   09/30/19 0815 09/30/19 0900  BP: (!) 166/95 (!) 152/66  Pulse: 80 82  Resp: (!) 22 18  Temp:  37 C  SpO2: 97% 96%    Last Pain:  Vitals:   09/30/19 0900  TempSrc: Oral                 Brennan Bailey

## 2019-09-30 NOTE — Progress Notes (Signed)
Report given to 4N RN.

## 2019-09-30 NOTE — ED Triage Notes (Signed)
Pt arrives by [facility] from home. Report from EMS Daughter called pt this am and noticed slurred speech. EMS called. EMS reports slurred speech and slight facial droop. LSN 2000 09/29/19

## 2019-09-30 NOTE — Progress Notes (Signed)
  Echocardiogram 2D Echocardiogram has been performed.  Belinda Anderson 09/30/2019, 1:33 PM

## 2019-09-30 NOTE — Transfer of Care (Signed)
Immediate Anesthesia Transfer of Care Note  Patient: [name]  Procedure(s) Performed: IR WITH ANESTHESIA (N/A )  Patient Location: NICU  Anesthesia Type:General  Level of Consciousness: drowsy  Airway & Oxygen Therapy: Patient Spontanous Breathing and Patient connected to nasal cannula oxygen  Post-op Assessment: Report given to RN and Post -op Vital signs reviewed and stable  Post vital signs: Reviewed and stable  Last Vitals:  Vitals Value Taken Time  BP    Temp    Pulse 85 09/30/19 1123  Resp 14 09/30/19 1124  SpO2 99 % 09/30/19 1123  Vitals shown include unvalidated device data.  Last Pain:  Vitals:   09/30/19 0900  TempSrc: Oral         Complications: No apparent anesthesia complications

## 2019-09-30 NOTE — Procedures (Signed)
S/P RT common carotid arteriogram followed by complete revascularization of of occluded mid   M2 seg of a codominant inf division with x 1 pass with 23mm x 29mm trevuprovue retriver device and  Penumbra aspiration achieving a TICI 3 revascularization. S.Skeeter Sheard MD

## 2019-09-30 NOTE — Progress Notes (Signed)
PT Cancellation Note  Patient Details Name: Belinda Anderson MRN: [id number] DOB: [birth year]   Cancelled Treatment:    Reason Eval/Treat Not Completed: Patient not medically ready.  Pt is on bed rest after sheath removal until after 1800 today.  Will wait until tomorrow to see as able. 09/30/2019  Ginger Carne., PT Acute Rehabilitation Services [phone]  (pager) [phone]  (office)   [name] 09/30/2019, 1:46 PM

## 2019-09-30 NOTE — ED Notes (Signed)
Pt transported to IR 

## 2019-09-30 NOTE — H&P (Addendum)
NEURO HOSPITALIST CONSULT NOTE   Requesting Physician: Dr. Benjiman CoreNathan Pickering     Chief Complaint: Code Stroke  History obtained from:  Patient, Family and Chart   HPI:                                                                                                                                         Belinda Anderson is an 85 y.o. female with a history of hypertension, arthritis, thyroid disease and dyslipidemia. Previously known PICA aneurysm. She lives independently and walks with a cane. She does not drive due to mild memory issues, according to her daughter. The patient continues to manage finances with assistance from her daughter.   She was last seen normal at 2000 on 09/29/19 by family. This morning her daughter called and noticed she was slurring her speech. Her daughter then went to see her and EMS was called. According to EMS the patient had worsening facial droop during transport to the hospital. Initial NIHSS 13 by the Stroke Team, unable to answer orientation questions correctly, gaze deviation, visual field neglect, left facial palsy, dysarthria, slight left arm and leg weakness, sensory extinction.  CT/CTA/CTP completed. CT no acute stroke, no hemorrhage. Perfusion no core. Severe M2 stenosis with distal M3 occlusion. SBP 150s, IV fluids running and placed in trendelenburg to improve perfusion.   Case discussed with Interventional Radiologist and it was determined she was appropriate for diagnostic angio with intent to treat if able. Dr. Laurence SlateAroor discussed the procedure with the patient and her daughter and they both wanted to proceed. The patient was transferred to [facility] to prepare for the procedure and the daughter spoke with the interventionalist for consent.   Date last known well: Date: 09/30/2019 Time last known well: Time: 20:00 tPA Given: No: Outside tPA window No Symptoms         0 No significant disability/able to carry out all usual  activities   1 Unable to carry out all previous activities but looks after own affairs             2 Requires help but walks without assistance     3 Unable to walk without assistance/unable to handle own bodily needs 4 Bedridden/incontinent        5 Dead          6  Modified Rankin: Rankin Score=2   Past Medical History:  Diagnosis Date  . Arthritis   . Hypertension   . Thyroid disease     Past Surgical History:  Procedure Laterality Date  . EYE SURGERY     lens implant    Family History  Problem Relation Age of Onset  . Arthritis Mother   . Heart disease Mother   . Heart disease Father 740       MI  . Coronary artery disease Other   . Diabetes Brother  DI         Social History:  reports that she has never smoked. She has never used smokeless tobacco. She reports that she does not drink alcohol or use drugs.  Allergies: No Known Allergies  Medications:                                                                                                                           I have reviewed the patient's current medications.  ROS:                                                                                                                                       History obtained from unable to obtain from patient due to acute stroke.  General Examination:                                                                                                      Blood pressure (!) 166/95, pulse 80, resp. rate (!) 22, SpO2 97 %.  Physical Exam  Constitutional: Appears well-developed and well-nourished.  Psych: Affect appropriate to situation Eyes: Normal external eye and conjunctiva. HENT: Normocephalic, no lesions, without obvious abnormality.   Musculoskeletal-no joint tenderness, deformity or swelling Cardiovascular: Bedside tele.   Respiratory: Effort normal, non-labored breathing saturations WNL GI: Soft.  No distension. Skin: WDI  Neurological  Examination Mental Status: Alert, slurred speech. No aphasia. Able to follow commands, some repeat direction needed.  Cranial Nerves: Gaze deviation, left visual field neglect, left facial palsy, dysarthria, slight left arm and leg weakness, extinction left  NIHSS LOC: 0 LOC Ques: 2 LOC Commands: 0 Best Gaze: 2 Visual: 2 Facial Palsy: 2 Left Arm: 1 Right Arm: 0 Left Leg: 1 Right Leg 0 Ataxia: 0 Sensory: 0 Language: 0 Dysarthria: 1 Extinction: 2 TOTAL: 13    Lab Results: Basic Metabolic Panel: Recent Labs  Lab 09/30/19 0843  NA 138  K 4.1  CL 108  GLUCOSE 110*  BUN 33*  CREATININE 1.60*    CBC: Recent Labs  Lab 09/30/19 0815 09/30/19 0843  WBC 6.9  --   NEUTROABS 5.1  --   HGB 13.1 12.9  HCT 40.8 38.0  MCV 96.7  --   PLT 233  --     Lipid Panel: No results for input(s): CHOL, TRIG, HDL, CHOLHDL, VLDL, LDLCALC in the last 168 hours.  CBG: No results for input(s): GLUCAP in the last 168 hours.  Imaging: CT HEAD CODE STROKE WO CONTRAST  Result Date: 09/30/2019 CLINICAL DATA:  Code stroke. Focal neuro deficit, greater than 6 hours, stroke suspected. Additional history provided: Facial droop, left-sided weakness, history of brain aneurysm. EXAM: CT HEAD WITHOUT CONTRAST TECHNIQUE: Contiguous axial images were obtained from the base of the skull through the vertex without intravenous contrast. COMPARISON:  MRI/MRA head 06/27/2013, head CT 04/10/2013 FINDINGS: Brain: No evidence of acute intracranial hemorrhage. No demarcated cortical infarction. No evidence of intracranial mass. No midline shift or extra-axial fluid collection. Moderate patchy hypodensity within the cerebral white matter is nonspecific, but consistent with chronic small vessel ischemic disease. Redemonstrated chronic lacunar infarct within the inferior left basal ganglia. 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 Mild generalized  parenchymal atrophy. Vascular: No hyperdense vessel or unexpected calcification. Skull: Normal. Negative for fracture or focal lesion. Sinuses/Orbits: Rightward gaze deviation. No significant paranasal sinus disease or mastoid effusion at the imaged levels. These results were communicated to Dr. Lorraine Lax at 8:35 amon 12/14/2020by text page via the [facility] messaging system. IMPRESSION: No CT evidence of acute intracranial abnormality. Stable generalized parenchymal atrophy and chronic small vessel ischemic disease. Redemonstrated chronic lacunar infarct within the inferior left basal ganglia. 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 Electronically Signed   By: Kellie Simmering DO   On: 09/30/2019 08:37     Assessment: 85 y.o. female 85 y.o.female with a history of hypertension, arthritis, thyroid disease and dyslipidemia. Previously known PICA aneurysm. Lives independently and walks with a cane. Last seen normal night prior 09/29/19 at 2000. Daughter called this morning and noticed slurred speech and went to see the patient. EMS was then called. Initial neurology exam NIHSS 13. Deficits included  gaze deviation, visual field neglect, left facial palsy, dysarthria, slight left arm and leg weakness, sensory extinction.   CT Head: No Acute abnormality  CTA Head/Neck: Severe right M2 Stenosis with right M3 occlusion. ASPECTS 10.   Impression:  Acute right MCA stroke status due to right M2 occlusion status post emergent mechanical thrombectomy Stroke Risk Factors - hyperlipidemia and hypertension  -- SCDs ordered. Aspirin and DVT Proph not ordered on admission, to be reevaluated following intervention procedure.  --BP goal less than 500 systolic --Continue Pravastatin 40mg  Daily, home medication --HgbA1c, fasting lipid panel --MRI brain without contrast --PT consult, OT consult, Speech consult --Echocardiogram --Telemetry monitoring --Frequent neuro  checks --NPO until passes stroke swallow screen --Admit to Neuro ICU following Interventional Radiology   Hypertension -BP goal less than 938 systolic post IR goals  Hyperlipidemia -Continue statin  Gwenyth Bouillon DNP, FNP-C  09/30/2019, 11:12 AM  Further recommendations to follow from attending neurologist.    NEUROHOSPITALIST ADDENDUM Performed a face to face diagnostic evaluation at time of code stroke.   I have reviewed the contents of history and physical exam as documented by PA/ARNP/Resident and agree with above documentation.  I have discussed and formulated the above plan as documented. Edits  to the note have been made as needed.  85year-old female presents with slurred speech, facial droop.  On arrival, when assessed by ED physician noted to have neglect as well as left-sided weakness.  Last known normal yesterday 8 PM.  CTA showed left M2 stenosis versus occlusion and CT perfusion showed perfusion deficit of 13 ml.  Due to high NIH stroke scale patient went diagnostic angiogram will plan for intervention, underwent thrombectomy with TICI 3 recanalization.  Patient extubated post procedure following commands.  Will initiate stroke work-up to identify etiology of stroke, suspect intracranial athero versus cardioembolic.   This patient is neurologically critically ill due to stroke s/p thrombectomy.  She is at risk for significant risk of neurological worsening from cerebral edema,  death from brain herniation, heart failure, hemorrhagic conversion, infection, respiratory failure and seizure. This patient's care requires constant monitoring of vital signs, hemodynamics, respiratory and cardiac monitoring, review of multiple databases, neurological assessment, discussion with family, other specialists and medical decision making of high complexity.  I spent 40 minutes of neurocritical time in the care of this patient.        Georgiana Spinner Chelsa Stout MD [facility] [phone]   If 7pm to 7am, please call on call as listed on AMION.

## 2019-09-30 NOTE — Code Documentation (Addendum)
85yo female arriving to [facility] via [place] at 75. Patient from home where she was LKW at 2000 last night at dinner with family. Daughter reports she was at her baseline at that time. Her daughter called her this morning at 0630 and noted she had slurred speech. She went to her house and found her with a left lean and reports symptoms have worsened since that time. Patient presented to the ED where a code stroke was activated. Stroke team to the bedside in CT. NIHSS 13, see documentation for details and code stroke times. Patient with right gaze, left facial droop, left arm and leg drift, and left visual and sensory neglect on exam. CTA head and neck and CTP completed. Patient is outside the window for treatment with IV tPA. Decision made to proceed to endovascular intervention. Patient transported to [facility]. Groins shaved and pulses marked. Patient intubated by anesthesia and transferred into the IR suite. Bedside handoff with anesthesia and IR RN.

## 2019-09-30 NOTE — Anesthesia Procedure Notes (Signed)
Procedure Name: Intubation Date/Time: 09/30/2019 9:57 AM Performed by: Orlie Dakin, CRNA Pre-anesthesia Checklist: Patient identified, Emergency Drugs available, Suction available and Patient being monitored Patient Re-evaluated:Patient Re-evaluated prior to induction Oxygen Delivery Method: Circle system utilized Preoxygenation: Pre-oxygenation with 100% oxygen Induction Type: IV induction, Rapid sequence and Cricoid Pressure applied Laryngoscope Size: Glidescope and 3 Grade View: Grade I Tube type: Oral Tube size: 7.5 mm Number of attempts: 1 Airway Equipment and Method: Stylet and Video-laryngoscopy Placement Confirmation: ETT inserted through vocal cords under direct vision,  breath sounds checked- equal and bilateral and positive ETCO2 Secured at: 22 cm Tube secured with: Tape Dental Injury: Teeth and Oropharynx as per pre-operative assessment  Comments: Glidescope, LoPro 3 used for covid precautions.  4x4s bite block used at end of proc.

## 2019-09-30 NOTE — Progress Notes (Signed)
Patient ID: Belinda Anderson, female   DOB: 09-Apr-1932, 85 y.o.   MRN: [id number] INR. Post  Procedure CT brain No ICH or mass effect seen. Extubated. Maintaining O2 sats. Pupils 12mm RT + LT  NO gross facial asymmetry. Tongue midline. Moves all 4s to command with mild Lt sided weakness. RT groin hemostasis with a n 75F angioseal closure device . Distal pulses all dopplerable in  both feet unchanged.Arlean Hopping MD

## 2019-09-30 NOTE — Progress Notes (Signed)
Patient ID: Belinda Anderson, female   DOB: 1932/07/03, 85 y.o.   MRN: [id number] INR. 31 YR F RT H LSW [address]  MRSS of 1-2   New onset of slurred speech ,and lt sided neglect and weakness. CT brain NO ICH ASPECTS 9. CTA occluded M2 seg of co- dominant inf division. CTP penumbra of ?22ml. NIH 10. Endovascular treatment discussed with daughter. Reasons,alternatives and risks discussed. Risks of [name] of 10 %,worsening neuro function,death and inability  To revascularize discussed.Daughter expressed understanding and provided consent to proceed. S.Sharunda Salmon MD

## 2019-10-01 ENCOUNTER — Inpatient Hospital Stay (HOSPITAL_COMMUNITY): Payer: Medicare Other

## 2019-10-01 DIAGNOSIS — I10 Essential (primary) hypertension: Secondary | ICD-10-CM

## 2019-10-01 DIAGNOSIS — I639 Cerebral infarction, unspecified: Secondary | ICD-10-CM

## 2019-10-01 DIAGNOSIS — E7800 Pure hypercholesterolemia, unspecified: Secondary | ICD-10-CM

## 2019-10-01 DIAGNOSIS — I6601 Occlusion and stenosis of right middle cerebral artery: Secondary | ICD-10-CM

## 2019-10-01 DIAGNOSIS — N1832 Chronic kidney disease, stage 3b: Secondary | ICD-10-CM

## 2019-10-01 LAB — BASIC METABOLIC PANEL
Anion gap: 10 (ref 5–15)
BUN: 21 mg/dL (ref 8–23)
CO2: 19 mmol/L — ABNORMAL LOW (ref 22–32)
Calcium: 8.8 mg/dL — ABNORMAL LOW (ref 8.9–10.3)
Chloride: 111 mmol/L (ref 98–111)
Creatinine, Ser: 1.52 mg/dL — ABNORMAL HIGH (ref 0.44–1.00)
GFR calc Af Amer: 36 mL/min — ABNORMAL LOW (ref >60)
GFR calc non Af Amer: 31 mL/min — ABNORMAL LOW (ref >60)
Glucose, Bld: 120 mg/dL — ABNORMAL HIGH (ref 70–99)
Potassium: 4.1 mmol/L (ref 3.5–5.1)
Sodium: 140 mmol/L (ref 135–145)

## 2019-10-01 LAB — CBC WITH DIFFERENTIAL/PLATELET
Abs Immature Granulocytes: 0.02 10*3/uL (ref 0.00–0.07)
Basophils Absolute: 0 10*3/uL (ref 0.0–0.1)
Basophils Relative: 0 %
Eosinophils Absolute: 0 10*3/uL (ref 0.0–0.5)
Eosinophils Relative: 0 %
HCT: 35.6 % — ABNORMAL LOW (ref 36.0–46.0)
Hemoglobin: 11.6 g/dL — ABNORMAL LOW (ref 12.0–15.0)
Immature Granulocytes: 0 %
Lymphocytes Relative: 21 %
Lymphs Abs: 1 10*3/uL (ref 0.7–4.0)
MCH: 30.9 pg (ref 26.0–34.0)
MCHC: 32.6 g/dL (ref 30.0–36.0)
MCV: 94.7 fL (ref 80.0–100.0)
Monocytes Absolute: 0.6 10*3/uL (ref 0.1–1.0)
Monocytes Relative: 14 %
Neutro Abs: 3 10*3/uL (ref 1.7–7.7)
Neutrophils Relative %: 65 %
Platelets: 177 10*3/uL (ref 150–400)
RBC: 3.76 MIL/uL — ABNORMAL LOW (ref 3.87–5.11)
RDW: 13.2 % (ref 11.5–15.5)
WBC: 4.6 10*3/uL (ref 4.0–10.5)
nRBC: 0 % (ref 0.0–0.2)

## 2019-10-01 LAB — HEMOGLOBIN A1C
Hgb A1c MFr Bld: 5.9 % — ABNORMAL HIGH (ref 4.8–5.6)
Mean Plasma Glucose: 122.63 mg/dL

## 2019-10-01 LAB — LIPID PANEL
Cholesterol: 142 mg/dL (ref 0–200)
HDL: 43 mg/dL (ref >40)
LDL Cholesterol: 88 mg/dL (ref 0–99)
Total CHOL/HDL Ratio: 3.3 RATIO
Triglycerides: 54 mg/dL (ref <150)
VLDL: 11 mg/dL (ref 0–40)

## 2019-10-01 MED ORDER — HYDRALAZINE HCL 20 MG/ML IJ SOLN: 5.0000 mg | INTRAMUSCULAR | Status: DC | PRN

## 2019-10-01 MED ORDER — QUETIAPINE FUMARATE 25 MG PO TABS
12.5000 mg | ORAL_TABLET | Freq: Every day | ORAL | Status: DC
  Filled 2019-10-01: qty 1

## 2019-10-01 MED ORDER — ATORVASTATIN CALCIUM 40 MG PO TABS
40.0000 mg | ORAL_TABLET | Freq: Every day | ORAL | Status: DC
  Administered 2019-10-01: 18:00:00 40 mg via ORAL
  Filled 2019-10-01: qty 1

## 2019-10-01 NOTE — Evaluation (Addendum)
Occupational Therapy Evaluation Patient Details Name: Belinda Messlizabeth Ortega MRN: [id number] DOB: [birth year] Today's Date: 10/01/2019    History of Present Illness Belinda Anderson is an 85 y.o. female with a history of hypertension, arthritis, thyroid disease and dyslipidemia. Previously known PICA aneurysm.  pt admitted viaEMS with worsening facial droop during transport to the hospital. Initial NIHSS 13 by the Stroke Team, unable to answer orientation questions correctly, gaze deviation, visual field neglect, left facial palsy, dysarthria, slight left arm and leg weakness, sensory extinction.  pt underwent revascularization of occluded M2 segment of R MCA.  MRI post procedure showing small acute infarcts withing the R MCA territory.   Clinical Impression   PTA pt living alone, independent with BADL but needing assist for IADL from family. She has some cognitive deficits at baseline. At time of eval, she is overall mod I- supervision for transfers and functional mobility. She presents with decreased orientation to the month and situation, poor problem solving skills, poor safety skills, and poor awareness of deficits. She was unable to navigate environment with significant cueing and remains impulsive. Due to cognitive deficits, [facility] to reduce risk of falls. Discussed with dtr at length the importance of 24/7 safety assist at this time. She shares that they have disconnected the stove and microwave due to previous safety events at the house. Will continue this education for ultimate safety of the patient and follow per POC listed below.     Follow Up Recommendations  Home health OT;Supervision/Assistance - 24 hour;Other (comment)(24/7 supervision for safety)    Equipment Recommendations  None recommended by OT    Recommendations for Other Services       Precautions / Restrictions Precautions Precautions: Fall Precaution Comments:  impulsive Restrictions Weight Bearing Restrictions: No      Mobility Bed Mobility Overal bed mobility: Modified Independent             General bed mobility comments: impulsive at times and needing safety cues  Transfers Overall transfer level: Needs assistance Equipment used: None Transfers: Sit to/from Stand Sit to Stand: Supervision         General transfer comment: supervision for safety due to slight LOB, needs safety and navigational cues    Balance Overall balance assessment: Mild deficits observed, not formally tested                                         ADL either performed or assessed with clinical judgement   ADL Overall ADL's : Modified independent                                       General ADL Comments: pt able to physically complete BADL at mod I, but needs supervision and cues for safety to reduce risk of falls     Vision Baseline Vision/History: No visual deficits Patient Visual Report: No change from baseline Vision Assessment?: No apparent visual deficits     Perception     Praxis      Pertinent Vitals/Pain Pain Assessment: No/denies pain     Hand Dominance Right   Extremity/Trunk Assessment Upper Extremity Assessment Upper Extremity Assessment: Overall WFL for tasks assessed   Lower Extremity Assessment Lower Extremity Assessment: Defer to PT evaluation  Communication Communication Communication: No difficulties   Cognition Arousal/Alertness: Awake/alert Behavior During Therapy: Impulsive Overall Cognitive Status: Impaired/Different from baseline Area of Impairment: Orientation;Attention;Memory;Following commands;Safety/judgement;Awareness;Problem solving                 Orientation Level: Disoriented to;Time;Situation Current Attention Level: Focused Memory: Decreased short-term memory Following Commands: Follows one step commands consistently Safety/Judgement: Decreased  awareness of deficits;Decreased awareness of safety Awareness: Emergent Problem Solving: Slow processing;Difficulty sequencing;Requires verbal cues;Requires tactile cues General Comments: pt impulsive with poor safety awareness. Pt did not pass simple trail making task and needing redirection. She also had poor recall when asked to remember 3 words. Her attention span is short as well. Dtr states some of this is baseline and that they have made home modifications as a result, but pt still lives alone   General Comments       Exercises     Shoulder Instructions      Home Living Family/patient expects to be discharged to:: Private residence Living Arrangements: Alone Available Help at Discharge: Family;Available PRN/intermittently Type of Home: House Home Access: Level entry;Stairs to enter Entrance Stairs-Number of Steps: small threshold step   Home Layout: One level     Bathroom Shower/Tub: Chief Strategy Officer: Handicapped height Bathroom Accessibility: Yes   Home Equipment: Cane - single point;Grab bars - tub/shower      Lives With: Alone    Prior Functioning/Environment Level of Independence: Independent with assistive device(s)        Comments: does not drive, but get out with her daughter. Dtr reports they have disconnected the stove and microwave due to previous safety incidents and needs IADL assistance        OT Problem List: Decreased knowledge of use of DME or AE;Decreased knowledge of precautions;Decreased activity tolerance;Decreased cognition;Impaired balance (sitting and/or standing);Decreased safety awareness      OT Treatment/Interventions: Self-care/ADL training;Therapeutic exercise;Patient/family education;Balance training;Energy conservation;Therapeutic activities;DME and/or AE instruction;Cognitive remediation/compensation    OT Goals(Current goals can be found in the care plan section) Acute Rehab OT Goals Patient Stated Goal: I'm  ready to go home OT Goal Formulation: With patient Time For Goal Achievement: 10/15/19 Potential to Achieve Goals: Good  OT Frequency: Min 2X/week   Barriers to D/C:            Co-evaluation              AM-PAC OT "6 Clicks" Daily Activity     Outcome Measure Help from another person eating meals?: A Little Help from another person taking care of personal grooming?: A Little Help from another person toileting, which includes using toliet, bedpan, or urinal?: A Little Help from another person bathing (including washing, rinsing, drying)?: A Little Help from another person to put on and taking off regular upper body clothing?: A Little Help from another person to put on and taking off regular lower body clothing?: A Little 6 Click Score: 18   End of Session Nurse Communication: Mobility status  Activity Tolerance: Patient tolerated treatment well Patient left: in chair;with call bell/phone within reach;with chair alarm set;with family/visitor present  OT Visit Diagnosis: Unsteadiness on feet (R26.81);Other abnormalities of gait and mobility (R26.89);Other symptoms and signs involving cognitive function                Time: 1829-9371 OT Time Calculation (min): 16 min Charges:  OT General Charges $OT Visit: 1 Visit OT Evaluation $OT Eval Moderate Complexity: 1 Mod  Dalphine Handing, MSOT, OTR/L [employer]  OT/ Acute Relief OT [facility] Office: [phone]  Zenovia Jarred 10/01/2019, 5:01 PM

## 2019-10-01 NOTE — Evaluation (Signed)
Speech Language Pathology Evaluation Patient Details Name: Belinda Anderson MRN: [id number] DOB: 01-25-1932 Today's Date: 10/01/2019 Time: 4709-6283 SLP Time Calculation (min) (ACUTE ONLY): 40 min  Problem List:  Patient Active Problem List   Diagnosis Date Noted  . 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 ([place]) 09/30/2019  . Middle cerebral artery embolism, right 09/30/2019  . Contusion of foot including toes, right, initial encounter 08/10/2018  . Primary osteoarthritis of right knee 07/31/2018  . Right leg swelling 07/17/2018  . Pain of right calf 07/17/2018  . Preventative health care 09/18/2017  . Low back pain radiating to left leg 11/28/2016  . Hyperlipidemia 02/02/2015  . Sciatica 07/16/2014  . Knee pain, acute 07/16/2014  . Lower extremity pain, right 07/16/2014  . Skin infection 07/16/2014  . Incontinence of urine in female 06/19/2013  . Cerebral aneurysm rupture ([place]) 04/08/2013  . LEG EDEMA, RIGHT 07/06/2010  . OTHER DRUG ALLERGY 07/06/2010  . DIARRHEA 02/12/2010  . HEMATURIA, HX OF 11/24/2009  . ANEURYSM, HX OF 08/03/2009  . B12 DEFICIENCY 07/24/2009  . 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 SYNCOPE 07/20/2009  . DIZZINESS 07/20/2009  . 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 Hypothyroidism 12/14/2007  . Essential hypertension 12/14/2007  . DIVERTICULITIS, HX OF 12/14/2007  . GOITER 01/30/2007  . HYPERTENSION, BENIGN 01/30/2007  . UTERINE POLYP 01/30/2007  . THYROIDECTOMY, HX OF 01/30/2007   Past Medical History:  Past Medical History:  Diagnosis Date  . Arthritis   . Hypertension   . Thyroid disease    Past Surgical History:  Past Surgical History:  Procedure Laterality Date  . EYE SURGERY     lens implant  . RADIOLOGY WITH ANESTHESIA N/A 09/30/2019   Procedure: IR WITH ANESTHESIA;  Surgeon: Luanne Bras, MD;  Location: [facility];  Service: Radiology;  Laterality: N/A;   HPI:   Belinda Anderson is an 85 y.o. female with a history of hypertension, arthritis, thyroid disease and dyslipidemia. Previously known PICA aneurysm.  pt admitted viaEMS with worsening facial droop during transport to the hospital. Initial NIHSS 13 by the Stroke Team, unable to answer orientation questions correctly, gaze deviation, visual field neglect, left facial palsy, dysarthria, slight left arm and leg weakness, sensory extinction.  pt underwent revascularization of occluded M2 segment of R MCA.  MRI post procedure showing small acute infarcts within the R MCA territory.  Assessment / Plan / Recommendation Clinical Impression  Pt was seen for a cognitive-linguistic evaluation in the setting of acute R infarcts.  Pt's daughter was present for this evaluation and she reported that the pt has some baseline deficits in short-term memory; however, she additionally reported acute cognitive changes since admission.  Pt completed the [facility] Cognitive Assessment [facility]) in addition to informal evaluation measures and she presents with cognitive deficits in the areas of executive functioning, short-term memory, and problem solving.  Pt was oriented x3 to self, place, and city.  She was not initially oriented to year or month; however, she benefited from being given a choice of two.  She was not oriented to date or day despite cues.  Pt scored overall 16/30 on the [place] indicating moderate cognitive-linguistic deficits.  Expressive and receptive language were functional and no dysarthria was observed.  Pt was unaware of her deficits and she denied acute changes, stating that she was ready to go back home by herself.  Pt reported that she currently lives alone and that her daughter assists her with  financial management.  Recommend home health ST targeting cognitive deficits and assistance with IADLs (finances, medications, etc.) at time of discharge.  Additionally recommend 24/7 supervision when the pt is first  discharged home (longer if necessary) to ensure safety with ADLs and IADLs in the setting of the pt's cognitive deficits.  SLP educated pt and daughter regarding all recommendations and daughter verbalized understanding.      SLP Assessment  SLP Recommendation/Assessment: Patient needs continued Speech Lanaguage Pathology Services SLP Visit Diagnosis: Cognitive communication deficit (R41.841)    Follow Up Recommendations       Frequency and Duration min 2x/week  2 weeks      SLP Evaluation Cognition  Overall Cognitive Status: Impaired/Different from baseline Arousal/Alertness: Awake/alert Orientation Level: Oriented to person;Oriented to place;Disoriented to situation;Disoriented to time Attention: Sustained Sustained Attention: Appears intact Memory: Impaired Memory Impairment: Retrieval deficit;Decreased short term memory;Decreased recall of new information Decreased Short Term Memory: Verbal basic Awareness: Impaired Awareness Impairment: Intellectual impairment Problem Solving: Impaired Problem Solving Impairment: Verbal complex;Functional complex Executive Function: Organizing;Reasoning Reasoning: Appears intact Organizing: Impaired Organizing Impairment: Functional complex Behaviors: Impulsive Safety/Judgment: Impaired       Comprehension  Auditory Comprehension Overall Auditory Comprehension: Appears within functional limits for tasks assessed    Expression Expression Primary Mode of Expression: Verbal Verbal Expression Overall Verbal Expression: Appears within functional limits for tasks assessed Written Expression Dominant Hand: Right   Oral / Motor  Oral Motor/Sensory Function Overall Oral Motor/Sensory Function: Within functional limits Motor Speech Overall Motor Speech: Appears within functional limits for tasks assessed   GO                   Villa Herb M.S., CCC-SLP Acute Rehabilitation Services Office: [phone]  Shanon Rosser [facility] 10/01/2019,  3:53 PM

## 2019-10-01 NOTE — Progress Notes (Signed)
Referring Physician(s): Code Stroke- Aroor, Sushanth R  Supervising Physician: Luanne Bras  Patient Status:  [name] - In-pt  Chief Complaint: None  Subjective:  Acute CVA s/p emergent cerebral arteriogram with 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 Estanislado Pandy. Patient awake and alert sitting in bed with no complaints at this time. Can spontaneously move all extremities. Right groin incision c/d/i.   Allergies: Patient has no known allergies.  Medications: Prior to Admission medications   Medication Sig Start Date End Date Taking? Authorizing Provider  Acetaminophen (TYLENOL ARTHRITIS PAIN PO) Take 2 tablets by mouth every 6 (six) hours as needed (pain).     [provider]  amLODipine (NORVASC) 2.5 MG tablet TAKE 1 TABLET BY MOUTH ONCE DAILY 05/10/19   [name], [name], DO  aspirin EC 325 MG EC tablet Take 325 mg by mouth daily.      [provider]  diclofenac sodium (VOLTAREN) 1 % GEL Apply 4 g topically 4 (four) times daily. 05/10/19   [name], DO  furosemide (LASIX) 40 MG tablet Take 1 tablet (40 mg total) by mouth daily. 05/10/19   [name], DO  levothyroxine (SYNTHROID) 75 MCG tablet TAKE 1 TABLET(75 MCG) BY MOUTH DAILY 05/10/19   [name], Kendrick Fries R, DO  NONFORMULARY OR COMPOUNDED ITEM Compression stockings 20-30 mm hg  #1  Dx edema 12/16/14   [name], [name], DO  potassium chloride SA (K-DUR) 20 MEQ tablet Take 1 tablet (20 mEq total) by mouth daily. 05/10/19   [name], DO  pravastatin (PRAVACHOL) 40 MG tablet TAKE 1 TABLET(40 MG) BY MOUTH DAILY 05/10/19   [name], [name], DO     Vital Signs: BP (!) 109/96   Pulse 67   Temp (!) 97.5 F (36.4 C) (Oral)   Resp 13   Ht 5\' 4"  (1.626 m)   Wt 149 lb (67.6 kg)   SpO2 91%   BMI 25.58 kg/m   Physical Exam Vitals and nursing note reviewed.  Constitutional:      General:  She is not in acute distress.    Appearance: Normal appearance.  Pulmonary:     Effort: Pulmonary effort is normal. No respiratory distress.  Skin:    General: Skin is warm and dry.     Comments: Right groin incision soft without active bleeding or hematoma.  Neurological:     Mental Status: She is alert.     Comments: Alert, awake, and oriented x3. Speech and comprehension intact. PERRL bilaterally. No facial asymmetry. Tongue midline. Can spontaneously move all extremities. No pronator drift. Distal pulses palpable bilaterally with Doppler.  Psychiatric:        Mood and Affect: Mood normal.        Behavior: Behavior normal.     Imaging: CT Angio Head W or Wo Contrast  Result Date: 09/30/2019 CLINICAL DATA:  Facial droop and left-sided weakness EXAM: CT ANGIOGRAPHY HEAD AND NECK CT PERFUSION BRAIN TECHNIQUE: Multidetector CT imaging of the head and neck was performed using the standard protocol during bolus administration of intravenous contrast. Multiplanar CT image reconstructions and MIPs were obtained to evaluate the vascular anatomy. Carotid stenosis measurements (when applicable) are obtained utilizing NASCET criteria, using the distal internal carotid diameter as the denominator. Multiphase CT imaging of the brain was performed following IV bolus contrast injection. Subsequent parametric perfusion maps were calculated using RAPID software. CONTRAST:  OMNIPAQUE IOHEXOL 350 MG/ML SOLN COMPARISON:  Head CT earlier today.  Brain MRI 06/27/2013 FINDINGS: CTA NECK FINDINGS Aortic arch: 2 vessel branching. Right carotid system: Atheromatous wall thickening of the common carotid to the level of the bifurcation. No flow limiting stenosis or ulceration. Negative for beading. Left carotid system: Calcified plaque at the bifurcation that is mild. No stenosis or ulceration. Vertebral arteries: Mild subclavian atherosclerosis on the right, more moderate on the left, without flow limiting  stenosis or ulceration. Mild-to-moderate atheromatous narrowing at the left vertebral origin due to calcified plaque. Skeleton: Diffuse degenerative disease of the cervical spine. Other neck: No acute or aggressive finding. Postoperative right thyroid. Upper chest: Biapical pleural based scarring Review of the MIP images confirms the above findings CTA HEAD FINDINGS Anterior circulation: Mild atherosclerotic plaque along the carotid siphons. There is a severe right M2 branch occlusion at the level of the insula with the downstream distal right M3 branch occlusion. Atherosclerotic irregularity of medium size vessels. Negative for aneurysm. Bilateral early branching MCA. Posterior circulation: Fenestrated left V4 segment and proximal basilar. 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 Symmetric flow in the posterior cerebral arteries. Venous sinuses: Negative Anatomic variants: As above Review of the MIP images confirms the above findings CT Brain Perfusion Findings: ASPECTS: 10 CBF (<30%) Volume: 0mL Perfusion (Tmax>6.0s) volume: 13mL These results were called by telephone at the time of interpretation on 09/30/2019 at 9:01 am to provider Arther Dames , who verbally acknowledged these results. IMPRESSION: 1. 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 This is associated with 13 cc of penumbra and even greater area of relative ischemia when using the strictest T-max parameters. 2. No flow limiting stenosis or embolic source seen in the more proximal neck. 3. 4 mm right PICA aneurysm. The left V4 segment and basilar are fenestrated. Electronically Signed   By: Marnee Spring M.D.   On: 09/30/2019 09:10   CT ANGIO NECK W OR WO CONTRAST  Result Date: 09/30/2019 CLINICAL DATA:  Facial droop and left-sided weakness EXAM: CT ANGIOGRAPHY HEAD AND NECK CT PERFUSION BRAIN TECHNIQUE: Multidetector CT imaging of the head and neck was  performed using the standard protocol during bolus administration of intravenous contrast. Multiplanar CT image reconstructions and MIPs were obtained to evaluate the vascular anatomy. Carotid stenosis measurements (when applicable) are obtained utilizing NASCET criteria, using the distal internal carotid diameter as the denominator. Multiphase CT imaging of the brain was performed following IV bolus contrast injection. Subsequent parametric perfusion maps were calculated using RAPID software. CONTRAST:  OMNIPAQUE IOHEXOL 350 MG/ML SOLN COMPARISON:  Head CT earlier today.  Brain MRI 06/27/2013 FINDINGS: CTA NECK FINDINGS Aortic arch: 2 vessel branching. Right carotid system: Atheromatous wall thickening of the common carotid to the level of the bifurcation. No flow limiting stenosis or ulceration. Negative for beading. Left carotid system: Calcified plaque at the bifurcation that is mild. No stenosis or ulceration. Vertebral arteries: Mild subclavian atherosclerosis on the right, more moderate on the left, without flow limiting stenosis or ulceration. Mild-to-moderate atheromatous narrowing at the left vertebral origin due to calcified plaque. Skeleton: Diffuse degenerative disease of the cervical spine. Other neck: No acute or aggressive finding. Postoperative right thyroid. Upper chest: Biapical pleural based scarring Review of the MIP images confirms the above findings CTA HEAD FINDINGS Anterior circulation: Mild atherosclerotic plaque along the carotid siphons. There is a severe right M2 branch occlusion at  the level of the insula with the downstream distal right M3 branch occlusion. Atherosclerotic irregularity of medium size vessels. Negative for aneurysm. Bilateral early branching MCA. Posterior circulation: Fenestrated left V4 segment and proximal basilar. 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 Symmetric flow in the posterior cerebral  arteries. Venous sinuses: Negative Anatomic variants: As above Review of the MIP images confirms the above findings CT Brain Perfusion Findings: ASPECTS: 10 CBF (<30%) Volume: 0mL Perfusion (Tmax>6.0s) volume: 13mL These results were called by telephone at the time of interpretation on 09/30/2019 at 9:01 am to provider Arther Dames , who verbally acknowledged these results. IMPRESSION: 1. 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 This is associated with 13 cc of penumbra and even greater area of relative ischemia when using the strictest T-max parameters. 2. No flow limiting stenosis or embolic source seen in the more proximal neck. 3. 4 mm right PICA aneurysm. The left V4 segment and basilar are fenestrated. Electronically Signed   By: Marnee Spring M.D.   On: 09/30/2019 09:10   MR BRAIN WO CONTRAST  Result Date: 09/30/2019 CLINICAL DATA:  Stroke, follow-up. Additional history provided: New onset speech difficulty and left-sided weakness, underwent revascularization EXAM: MRI HEAD WITHOUT CONTRAST TECHNIQUE: Multiplanar, multiecho pulse sequences of the brain and surrounding structures were obtained without intravenous contrast. COMPARISON:  Non-contrast CT head and CT angiogram head/neck performed earlier the same day 09/30/2019, brain MRI 06/27/2013 FINDINGS: Brain: Multiple sequences are motion degraded. Most notably, there is moderate motion degradation of the axial T2 weighted sequence and axial T2 FLAIR sequence, as well as severe motion degradation of the axial T1 weighted sequence. There are several small acute infarcts within the right MCA vascular territory as follows. 10 mm acute infarct within the right insular cortex (series 3, image 24). A subcentimeter acute infarct is also present within the adjacent right subinsular region (series 3, images 25-26). Subcentimeter acute cortical infarct within the superior right temporal lobe (series 3, image 22). Small acute  infarct within the lateral right postcentral gyrus (series 30, image 38). No evidence of intracranial mass. No midline shift or extra-axial fluid collection. No chronic intracranial blood products. Moderate chronic small vessel ischemic disease. Redemonstrated T2 hyperintense lesions within the globus pallidus bilaterally consistent with sequela of remote insult. Mild generalized parenchymal atrophy Vascular: Flow voids maintained within the proximal large arterial vessels. Please refer to CTA head/neck performed earlier the same day for description of a 4 mm right PICA aneurysm. Skull and upper cervical spine: No focal marrow lesion. Incompletely assessed upper cervical spondylosis. Sinuses/Orbits: Bilateral lens replacements. No significant paranasal sinus disease or mastoid effusion at the imaged levels. IMPRESSION: Significantly motion degraded examination. There are a few small acute infarcts within the right MCA territory measuring up to 10 mm. These infarcts are located within the right insular cortex, right subinsular region, superior right temporal lobe cortex and cortex of the right postcentral gyrus. Stable generalized parenchymal atrophy and chronic small vessel ischemic disease. Redemonstrated symmetric lesions within the globus pallidus bilaterally. Findings are consistent with sequela of remote insult, possibly carbon monoxide poisoning or other anoxic brain injury. Electronically Signed   By: Jackey Loge DO   On: 09/30/2019 16:45   CT CEREBRAL PERFUSION W CONTRAST  Result Date: 09/30/2019 CLINICAL DATA:  Facial droop and left-sided weakness EXAM: CT ANGIOGRAPHY HEAD AND NECK CT PERFUSION BRAIN TECHNIQUE: Multidetector CT imaging of the head and neck  was performed using the standard protocol during bolus administration of intravenous contrast. Multiplanar CT image reconstructions and MIPs were obtained to evaluate the vascular anatomy. Carotid stenosis measurements (when applicable) are obtained  utilizing NASCET criteria, using the distal internal carotid diameter as the denominator. Multiphase CT imaging of the brain was performed following IV bolus contrast injection. Subsequent parametric perfusion maps were calculated using RAPID software. CONTRAST:  OMNIPAQUE IOHEXOL 350 MG/ML SOLN COMPARISON:  Head CT earlier today.  Brain MRI 06/27/2013 FINDINGS: CTA NECK FINDINGS Aortic arch: 2 vessel branching. Right carotid system: Atheromatous wall thickening of the common carotid to the level of the bifurcation. No flow limiting stenosis or ulceration. Negative for beading. Left carotid system: Calcified plaque at the bifurcation that is mild. No stenosis or ulceration. Vertebral arteries: Mild subclavian atherosclerosis on the right, more moderate on the left, without flow limiting stenosis or ulceration. Mild-to-moderate atheromatous narrowing at the left vertebral origin due to calcified plaque. Skeleton: Diffuse degenerative disease of the cervical spine. Other neck: No acute or aggressive finding. Postoperative right thyroid. Upper chest: Biapical pleural based scarring Review of the MIP images confirms the above findings CTA HEAD FINDINGS Anterior circulation: Mild atherosclerotic plaque along the carotid siphons. There is a severe right M2 branch occlusion at the level of the insula with the downstream distal right M3 branch occlusion. Atherosclerotic irregularity of medium size vessels. Negative for aneurysm. Bilateral early branching MCA. Posterior circulation: Fenestrated left V4 segment and proximal basilar. 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 Symmetric flow in the posterior cerebral arteries. Venous sinuses: Negative Anatomic variants: As above Review of the MIP images confirms the above findings CT Brain Perfusion Findings: ASPECTS: 10 CBF (<30%) Volume: 0mL Perfusion (Tmax>6.0s) volume: 13mL These results were called by  telephone at the time of interpretation on 09/30/2019 at 9:01 am to provider Arther Dames , who verbally acknowledged these results. IMPRESSION: 1. 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 This is associated with 13 cc of penumbra and even greater area of relative ischemia when using the strictest T-max parameters. 2. No flow limiting stenosis or embolic source seen in the more proximal neck. 3. 4 mm right PICA aneurysm. The left V4 segment and basilar are fenestrated. Electronically Signed   By: Marnee Spring M.D.   On: 09/30/2019 09:10   DG Chest Portable 1 View  Result Date: 09/30/2019 CLINICAL DATA:  Stroke, altered mental status, weakness, hypertension. EXAM: PORTABLE CHEST 1 VIEW COMPARISON:  CT angiogram chest/abdomen/pelvis 02/22/2018 FINDINGS: Heart size within normal limits.  Aortic atherosclerosis. There is no airspace consolidation within the lungs. No evidence of pleural effusion or pneumothorax. No acute bony abnormality. Degenerative change of the thoracic spine. Surgical clips project over the lower neck. Overlying cardiac monitoring leads. IMPRESSION: No airspace consolidation. Aortic atherosclerosis. Electronically Signed   By: Jackey Loge DO   On: 09/30/2019 13:15   ECHOCARDIOGRAM COMPLETE  Result Date: 09/30/2019   ECHOCARDIOGRAM REPORT   Patient Name:   Belinda Anderson Date of Exam: 09/30/2019 Medical Rec #:  [id number]       Height:       64.0 in Accession #:    [id number]      Weight:       149.0 lb Date of Birth:  1932/07/30       BSA:          1.73 m Patient Age:    10  years        BP:           155/94 mmHg Patient Gender: F               HR:           77 bpm. Exam Location:  Inpatient Procedure: 2D Echo, Cardiac Doppler and Color Doppler Indications:    Stroke.  History:        Patient has prior history of Echocardiogram examinations, most                 recent 10/31/2005. Stroke, Signs/Symptoms:Syncope and                 Dizziness/Lightheadedness;  Risk Factors:Dyslipidemia and                 Hypertension.  Sonographer:    Sheralyn Boatman RDCS Referring Phys: [id number] Lafe Garin IMPRESSIONS  1. Left ventricular ejection fraction, by visual estimation, is 65 to 70%. The left ventricle has normal function. There is moderately increased left ventricular hypertrophy.  2. Small left ventricular internal cavity size.  3. The left ventricle has no regional wall motion abnormalities.  4. Global right ventricle has normal systolic function.The right ventricular size is normal. No increase in right ventricular wall thickness.  5. Left atrial size was moderately dilated.  6. Right atrial size was normal.  7. The mitral valve is normal in structure. Mild mitral valve regurgitation.  8. The tricuspid valve is normal in structure. Tricuspid valve regurgitation is mild.  9. The aortic valve is tricuspid. Aortic valve regurgitation is not visualized. Mild to moderate aortic valve sclerosis/calcification without any evidence of aortic stenosis. 10. Moderate calcifications of AV leaflets. No stenosis. 11. The pulmonic valve was not well visualized. Pulmonic valve regurgitation is not visualized. 12. Mildly elevated pulmonary artery systolic pressure. FINDINGS  Left Ventricle: Left ventricular ejection fraction, by visual estimation, is 65 to 70%. The left ventricle has normal function. The left ventricle has no regional wall motion abnormalities. The left ventricular internal cavity size was the LV cavity size is small. There is moderately increased left ventricular hypertrophy. Right Ventricle: The right ventricular size is normal. No increase in right ventricular wall thickness. Global RV systolic function is has normal systolic function. The tricuspid regurgitant velocity is 2.58 m/s, and with an assumed right atrial pressure  of 8 mmHg, the estimated right ventricular systolic pressure is mildly elevated at 34.5 mmHg. Left Atrium: Left atrial size was moderately dilated.  Right Atrium: Right atrial size was normal in size Pericardium: There is no evidence of pericardial effusion. Mitral Valve: The mitral valve is normal in structure. Mild mitral valve regurgitation. Tricuspid Valve: The tricuspid valve is normal in structure. Tricuspid valve regurgitation is mild. Aortic Valve: The aortic valve is tricuspid. Aortic valve regurgitation is not visualized. Mild to moderate aortic valve sclerosis/calcification is present, without any evidence of aortic stenosis. Moderate calcifications of AV leaflets. No stenosis. Pulmonic Valve: The pulmonic valve was not well visualized. Pulmonic valve regurgitation is not visualized. Pulmonic regurgitation is not visualized. Aorta: The aortic root is normal in size and structure. IAS/Shunts: No atrial level shunt detected by color flow Doppler.  LEFT VENTRICLE PLAX 2D LVIDd:         3.61 cm       Diastology LVIDs:         2.27 cm       LV e' lateral:   6.96 cm/s LV  PW:         1.21 cm       LV E/e' lateral: 15.1 LV IVS:        1.67 cm       LV e' medial:    5.98 cm/s LVOT diam:     1.90 cm       LV E/e' medial:  17.6 LV SV:         37 ml LV SV Index:   21.17 LVOT Area:     2.84 cm  LV Volumes (MOD) LV area d, A2C:    23.30 cm LV area d, A4C:    18.20 cm LV area s, A2C:    10.30 cm LV area s, A4C:    7.63 cm LV major d, A2C:   7.16 cm LV major d, A4C:   6.07 cm LV major s, A2C:   5.37 cm LV major s, A4C:   5.27 cm LV vol d, MOD A2C: 63.3 ml LV vol d, MOD A4C: 44.1 ml LV vol s, MOD A2C: 16.9 ml LV vol s, MOD A4C: 9.3 ml LV SV MOD A2C:     46.4 ml LV SV MOD A4C:     44.1 ml LV SV MOD BP:      44.3 ml RIGHT VENTRICLE             IVC RV S prime:     15.40 cm/s  IVC diam: 2.25 cm TAPSE (M-mode): 2.3 cm LEFT ATRIUM             Index       RIGHT ATRIUM           Index LA diam:        3.90 cm 2.26 cm/m  RA Area:     10.50 cm LA Vol (A2C):   81.7 ml 47.33 ml/m RA Volume:   19.90 ml  11.53 ml/m LA Vol (A4C):   62.3 ml 36.09 ml/m LA Biplane Vol: 73.8 ml  42.75 ml/m  AORTIC VALVE LVOT Vmax:   121.00 cm/s LVOT Vmean:  80.500 cm/s LVOT VTI:    0.262 m  AORTA Ao Root diam: 3.30 cm Ao Asc diam:  3.40 cm MITRAL VALVE                         TRICUSPID VALVE MV Area (PHT): 2.76 cm              TR Peak grad:   26.5 mmHg MV PHT:        79.75 msec            TR Vmax:        287.00 cm/s MV Decel Time: 275 msec MV E velocity: 105.00 cm/s 103 cm/s  SHUNTS MV A velocity: 79.50 cm/s  70.3 cm/s Systemic VTI:  0.26 m MV E/A ratio:  1.32        1.5       Systemic Diam: 1.90 cm  Dietrich Pates MD Electronically signed by Dietrich Pates MD Signature Date/Time: 09/30/2019/3:25:43 PM    Final    CT HEAD CODE STROKE WO CONTRAST  Result Date: 09/30/2019 CLINICAL DATA:  Code stroke. Focal neuro deficit, greater than 6 hours, stroke suspected. Additional history provided: Facial droop, left-sided weakness, history of brain aneurysm. EXAM: CT HEAD WITHOUT CONTRAST TECHNIQUE: Contiguous axial images were obtained from the base of the skull through the vertex without intravenous contrast. COMPARISON:  MRI/MRA head 06/27/2013,  head CT 04/10/2013 FINDINGS: Brain: No evidence of acute intracranial hemorrhage. No demarcated cortical infarction. No evidence of intracranial mass. No midline shift or extra-axial fluid collection. Moderate patchy hypodensity within the cerebral white matter is nonspecific, but consistent with chronic small vessel ischemic disease. Redemonstrated chronic lacunar infarct within the inferior left basal ganglia. 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 Mild generalized parenchymal atrophy. Vascular: No hyperdense vessel or unexpected calcification. Skull: Normal. Negative for fracture or focal lesion. Sinuses/Orbits: Rightward gaze deviation. No significant paranasal sinus disease or mastoid effusion at the imaged levels. These results were communicated to Dr. Laurence Slate at 8:35 amon 12/14/2020by text page via the  [facility] messaging system. IMPRESSION: No CT evidence of acute intracranial abnormality. Stable generalized parenchymal atrophy and chronic small vessel ischemic disease. Redemonstrated chronic lacunar infarct within the inferior left basal ganglia. 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 Electronically Signed   By: Jackey Loge DO   On: 09/30/2019 08:37   VAS [place] LOWER EXTREMITY VENOUS (DVT)  Result Date: 10/01/2019  Lower Venous Study Indications: Embolic stroke.  Risk Factors: Surgery 09/30/2019 - IR WITH ANESTHESIA. Limitations: Poor ultrasound/tissue interface and bandages. Comparison Study: No prior studies. Performing Technologist: Chanda Busing RVT  Examination Guidelines: A complete evaluation includes B-mode imaging, spectral Doppler, color Doppler, and power Doppler as needed of all accessible portions of each vessel. Bilateral testing is considered an integral part of a complete examination. Limited examinations for reoccurring indications may be performed as noted.  +---------+---------------+---------+-----------+----------+--------------+ RIGHT    CompressibilityPhasicitySpontaneityPropertiesThrombus Aging +---------+---------------+---------+-----------+----------+--------------+ CFV      Full           Yes      Yes                                 +---------+---------------+---------+-----------+----------+--------------+ SFJ      Full                                                        +---------+---------------+---------+-----------+----------+--------------+ FV Prox  Full                                                        +---------+---------------+---------+-----------+----------+--------------+ FV Mid   Full                                                        +---------+---------------+---------+-----------+----------+--------------+ FV DistalFull                                                         +---------+---------------+---------+-----------+----------+--------------+ PFV      Full                                                        +---------+---------------+---------+-----------+----------+--------------+  POP      Full           Yes      Yes                                 +---------+---------------+---------+-----------+----------+--------------+ PTV      Full                                                        +---------+---------------+---------+-----------+----------+--------------+ PERO     Full                                                        +---------+---------------+---------+-----------+----------+--------------+   +---------+---------------+---------+-----------+----------+--------------+ LEFT     CompressibilityPhasicitySpontaneityPropertiesThrombus Aging +---------+---------------+---------+-----------+----------+--------------+ CFV      Full           Yes      Yes                                 +---------+---------------+---------+-----------+----------+--------------+ SFJ      Full                                                        +---------+---------------+---------+-----------+----------+--------------+ FV Prox  Full                                                        +---------+---------------+---------+-----------+----------+--------------+ FV Mid   Full                                                        +---------+---------------+---------+-----------+----------+--------------+ FV DistalFull                                                        +---------+---------------+---------+-----------+----------+--------------+ PFV      Full                                                        +---------+---------------+---------+-----------+----------+--------------+ POP      Full           Yes      Yes                                  +---------+---------------+---------+-----------+----------+--------------+  PTV      Full                                                        +---------+---------------+---------+-----------+----------+--------------+ PERO     Full                                                        +---------+---------------+---------+-----------+----------+--------------+     Summary: Right: There is no evidence of deep vein thrombosis in the lower extremity. However, portions of this examination were limited- see technologist comments above. No cystic structure found in the popliteal fossa. Left: There is no evidence of deep vein thrombosis in the lower extremity. No cystic structure found in the popliteal fossa.  *See table(s) above for measurements and observations.    Preliminary     Labs:  CBC: Recent Labs    11/16/18 1028 09/30/19 0815 09/30/19 0843 10/01/19 0530  WBC 6.8 6.9  --  4.6  HGB 13.9 13.1 12.9 11.6*  HCT 42.0 40.8 38.0 35.6*  PLT 262.0 233  --  177    COAGS: Recent Labs    09/30/19 0815  INR 1.0  APTT 25    BMP: Recent Labs    02/15/19 1005 05/10/19 1008 09/30/19 0815 09/30/19 0843 10/01/19 0530  NA 143 143 141 138 140  K 4.2 4.2 3.9 4.1 4.1  CL 104 105 107 108 111  CO2 29 29 22   --  19*  GLUCOSE 90 85 113* 110* 120*  BUN 21 21 24* 33* 21  CALCIUM 9.1 9.3 9.2  --  8.8*  CREATININE 1.46* 1.62* 1.77* 1.60* 1.52*  GFRNONAA  --   --  26*  --  31*  GFRAA  --   --  30*  --  36*    LIVER FUNCTION TESTS: Recent Labs    11/16/18 1028 02/15/19 1005 05/10/19 1008 09/30/19 0815  BILITOT 0.6 0.5 0.6 0.8  AST 15 13 15 23   ALT 9 7 8 13   ALKPHOS 108 108 105 104  PROT 6.9 6.4 6.3 6.6  ALBUMIN 4.5 4.1 4.2 3.9    Assessment and Plan:  Acute CVA s/p emergent cerebral arteriogram with 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 Corliss Skainseveshwar. Patient's condition stable- can spontaneously  move all extremities, speech clear. Right groin incision stable, distal pulses palpable bilaterally with Doppler. 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 Corliss Skainseveshwar in clinic 4 weeks after discharge to discuss management options (order placed to facilitate this). Further plans per neurology- appreciate and agree with management. Please call [facility] with questions/concerns.   Electronically Signed: Elwin MochaAlexandra Tibor Lemmons, PA-C 10/01/2019, 10:02 AM   I spent a total of 25 Minutes at the the patient's bedside AND on the patient's hospital floor or unit, greater than 50% of which was counseling/coordinating care for right MCA M2 segment occlusion s/p revascularization.

## 2019-10-01 NOTE — Progress Notes (Signed)
STROKE TEAM PROGRESS NOTE   INTERVAL HISTORY Pt RN at bedside. Pt awake alert and no significant neuro deficit. She denies any heart palpitation or racing heart at home. BP on the lower end. No DVT. Will do loop recorder.   Vitals:   10/01/19 0600 10/01/19 0700 10/01/19 0800 10/01/19 0830  BP: 138/71 107/77  (!) 109/96  Pulse: 64 84  67  Resp: 17   13  Temp:   (!) 97.5 F (36.4 C)   TempSrc:   Oral   SpO2: 97% 96%  91%  Weight:      Height:        CBC:  Recent Labs  Lab 09/30/19 0815 09/30/19 0843 10/01/19 0530  WBC 6.9  --  4.6  NEUTROABS 5.1  --  3.0  HGB 13.1 12.9 11.6*  HCT 40.8 38.0 35.6*  MCV 96.7  --  94.7  PLT 233  --  177    Basic Metabolic Panel:  Recent Labs  Lab 09/30/19 0815 09/30/19 0843 10/01/19 0530  NA 141 138 140  K 3.9 4.1 4.1  CL 107 108 111  CO2 22  --  19*  GLUCOSE 113* 110* 120*  BUN 24* 33* 21  CREATININE 1.77* 1.60* 1.52*  CALCIUM 9.2  --  8.8*   Lipid Panel:     Component Value Date/Time   CHOL 142 10/01/2019 0530   TRIG 54 10/01/2019 0530   HDL 43 10/01/2019 0530   CHOLHDL 3.3 10/01/2019 0530   VLDL 11 10/01/2019 0530   LDLCALC 88 10/01/2019 0530   HgbA1c:  Lab Results  Component Value Date   HGBA1C 5.9 (H) 10/01/2019   Urine Drug Screen:     Component Value Date/Time   LABOPIA NONE DETECTED 09/30/2019 0859   COCAINSCRNUR NONE DETECTED 09/30/2019 0859   LABBENZ NONE DETECTED 09/30/2019 0859   AMPHETMU NONE DETECTED 09/30/2019 0859   THCU NONE DETECTED 09/30/2019 0859   LABBARB NONE DETECTED 09/30/2019 0859    Alcohol Level     Component Value Date/Time   ETH <10 09/30/2019 1150    IMAGING CT Angio Head W or Wo Contrast  Result Date: 09/30/2019 CLINICAL DATA:  Facial droop and left-sided weakness EXAM: CT ANGIOGRAPHY HEAD AND NECK CT PERFUSION BRAIN TECHNIQUE: Multidetector CT imaging of the head and neck was performed using the standard protocol during bolus administration of intravenous contrast. Multiplanar  CT image reconstructions and MIPs were obtained to evaluate the vascular anatomy. Carotid stenosis measurements (when applicable) are obtained utilizing NASCET criteria, using the distal internal carotid diameter as the denominator. Multiphase CT imaging of the brain was performed following IV bolus contrast injection. Subsequent parametric perfusion maps were calculated using RAPID software. CONTRAST:  OMNIPAQUE IOHEXOL 350 MG/ML SOLN COMPARISON:  Head CT earlier today.  Brain MRI 06/27/2013 FINDINGS: CTA NECK FINDINGS Aortic arch: 2 vessel branching. Right carotid system: Atheromatous wall thickening of the common carotid to the level of the bifurcation. No flow limiting stenosis or ulceration. Negative for beading. Left carotid system: Calcified plaque at the bifurcation that is mild. No stenosis or ulceration. Vertebral arteries: Mild subclavian atherosclerosis on the right, more moderate on the left, without flow limiting stenosis or ulceration. Mild-to-moderate atheromatous narrowing at the left vertebral origin due to calcified plaque. Skeleton: Diffuse degenerative disease of the cervical spine. Other neck: No acute or aggressive finding. Postoperative right thyroid. Upper chest: Biapical pleural based scarring Review of the MIP images confirms the above findings CTA HEAD FINDINGS Anterior circulation:  Mild atherosclerotic plaque along the carotid siphons. There is a severe right M2 branch occlusion at the level of the insula with the downstream distal right M3 branch occlusion. Atherosclerotic irregularity of medium size vessels. Negative for aneurysm. Bilateral early branching MCA. Posterior circulation: Fenestrated left V4 segment and proximal basilar. 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 Symmetric flow in the posterior cerebral arteries. Venous sinuses: Negative Anatomic variants: As above Review of the MIP images confirms the  above findings CT Brain Perfusion Findings: ASPECTS: 10 CBF (<30%) Volume: 84mL Perfusion (Tmax>6.0s) volume: 1mL These results were called by telephone at the time of interpretation on 09/30/2019 at 9:01 am to provider Arther Dames , who verbally acknowledged these results. IMPRESSION: 1. 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 This is associated with 13 cc of penumbra and even greater area of relative ischemia when using the strictest T-max parameters. 2. No flow limiting stenosis or embolic source seen in the more proximal neck. 3. 4 mm right PICA aneurysm. The left V4 segment and basilar are fenestrated. Electronically Signed   By: Marnee Spring M.D.   On: 09/30/2019 09:10   CT ANGIO NECK W OR WO CONTRAST  Result Date: 09/30/2019 CLINICAL DATA:  Facial droop and left-sided weakness EXAM: CT ANGIOGRAPHY HEAD AND NECK CT PERFUSION BRAIN TECHNIQUE: Multidetector CT imaging of the head and neck was performed using the standard protocol during bolus administration of intravenous contrast. Multiplanar CT image reconstructions and MIPs were obtained to evaluate the vascular anatomy. Carotid stenosis measurements (when applicable) are obtained utilizing NASCET criteria, using the distal internal carotid diameter as the denominator. Multiphase CT imaging of the brain was performed following IV bolus contrast injection. Subsequent parametric perfusion maps were calculated using RAPID software. CONTRAST:  OMNIPAQUE IOHEXOL 350 MG/ML SOLN COMPARISON:  Head CT earlier today.  Brain MRI 06/27/2013 FINDINGS: CTA NECK FINDINGS Aortic arch: 2 vessel branching. Right carotid system: Atheromatous wall thickening of the common carotid to the level of the bifurcation. No flow limiting stenosis or ulceration. Negative for beading. Left carotid system: Calcified plaque at the bifurcation that is mild. No stenosis or ulceration. Vertebral arteries: Mild subclavian atherosclerosis on the right,  more moderate on the left, without flow limiting stenosis or ulceration. Mild-to-moderate atheromatous narrowing at the left vertebral origin due to calcified plaque. Skeleton: Diffuse degenerative disease of the cervical spine. Other neck: No acute or aggressive finding. Postoperative right thyroid. Upper chest: Biapical pleural based scarring Review of the MIP images confirms the above findings CTA HEAD FINDINGS Anterior circulation: Mild atherosclerotic plaque along the carotid siphons. There is a severe right M2 branch occlusion at the level of the insula with the downstream distal right M3 branch occlusion. Atherosclerotic irregularity of medium size vessels. Negative for aneurysm. Bilateral early branching MCA. Posterior circulation: Fenestrated left V4 segment and proximal basilar. 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 Symmetric flow in the posterior cerebral arteries. Venous sinuses: Negative Anatomic variants: As above Review of the MIP images confirms the above findings CT Brain Perfusion Findings: ASPECTS: 10 CBF (<30%) Volume: 42mL Perfusion (Tmax>6.0s) volume: 67mL These results were called by telephone at the time of interpretation on 09/30/2019 at 9:01 am to provider Arther Dames , who verbally acknowledged these results. IMPRESSION: 1. 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 This is associated with 13 cc  of penumbra and even greater area of relative ischemia when using the strictest T-max parameters. 2. No flow limiting stenosis or embolic source seen in the more proximal neck. 3. 4 mm right PICA aneurysm. The left V4 segment and basilar are fenestrated. Electronically Signed   By: Marnee Spring M.D.   On: 09/30/2019 09:10   MR BRAIN WO CONTRAST  Result Date: 09/30/2019 CLINICAL DATA:  Stroke, follow-up. Additional history provided: New onset speech difficulty and left-sided weakness, underwent  revascularization EXAM: MRI HEAD WITHOUT CONTRAST TECHNIQUE: Multiplanar, multiecho pulse sequences of the brain and surrounding structures were obtained without intravenous contrast. COMPARISON:  Non-contrast CT head and CT angiogram head/neck performed earlier the same day 09/30/2019, brain MRI 06/27/2013 FINDINGS: Brain: Multiple sequences are motion degraded. Most notably, there is moderate motion degradation of the axial T2 weighted sequence and axial T2 FLAIR sequence, as well as severe motion degradation of the axial T1 weighted sequence. There are several small acute infarcts within the right MCA vascular territory as follows. 10 mm acute infarct within the right insular cortex (series 3, image 24). A subcentimeter acute infarct is also present within the adjacent right subinsular region (series 3, images 25-26). Subcentimeter acute cortical infarct within the superior right temporal lobe (series 3, image 22). Small acute infarct within the lateral right postcentral gyrus (series 30, image 38). No evidence of intracranial mass. No midline shift or extra-axial fluid collection. No chronic intracranial blood products. Moderate chronic small vessel ischemic disease. Redemonstrated T2 hyperintense lesions within the globus pallidus bilaterally consistent with sequela of remote insult. Mild generalized parenchymal atrophy Vascular: Flow voids maintained within the proximal large arterial vessels. Please refer to CTA head/neck performed earlier the same day for description of a 4 mm right PICA aneurysm. Skull and upper cervical spine: No focal marrow lesion. Incompletely assessed upper cervical spondylosis. Sinuses/Orbits: Bilateral lens replacements. No significant paranasal sinus disease or mastoid effusion at the imaged levels. IMPRESSION: Significantly motion degraded examination. There are a few small acute infarcts within the right MCA territory measuring up to 10 mm. These infarcts are located within the  right insular cortex, right subinsular region, superior right temporal lobe cortex and cortex of the right postcentral gyrus. Stable generalized parenchymal atrophy and chronic small vessel ischemic disease. Redemonstrated symmetric lesions within the globus pallidus bilaterally. Findings are consistent with sequela of remote insult, possibly carbon monoxide poisoning or other anoxic brain injury. Electronically Signed   By: Jackey Loge DO   On: 09/30/2019 16:45   CT CEREBRAL PERFUSION W CONTRAST  Result Date: 09/30/2019 CLINICAL DATA:  Facial droop and left-sided weakness EXAM: CT ANGIOGRAPHY HEAD AND NECK CT PERFUSION BRAIN TECHNIQUE: Multidetector CT imaging of the head and neck was performed using the standard protocol during bolus administration of intravenous contrast. Multiplanar CT image reconstructions and MIPs were obtained to evaluate the vascular anatomy. Carotid stenosis measurements (when applicable) are obtained utilizing NASCET criteria, using the distal internal carotid diameter as the denominator. Multiphase CT imaging of the brain was performed following IV bolus contrast injection. Subsequent parametric perfusion maps were calculated using RAPID software. CONTRAST:  OMNIPAQUE IOHEXOL 350 MG/ML SOLN COMPARISON:  Head CT earlier today.  Brain MRI 06/27/2013 FINDINGS: CTA NECK FINDINGS Aortic arch: 2 vessel branching. Right carotid system: Atheromatous wall thickening of the common carotid to the level of the bifurcation. No flow limiting stenosis or ulceration. Negative for beading. Left carotid system: Calcified plaque at the bifurcation that is mild. No stenosis or ulceration. Vertebral  arteries: Mild subclavian atherosclerosis on the right, more moderate on the left, without flow limiting stenosis or ulceration. Mild-to-moderate atheromatous narrowing at the left vertebral origin due to calcified plaque. Skeleton: Diffuse degenerative disease of the cervical spine. Other neck: No  acute or aggressive finding. Postoperative right thyroid. Upper chest: Biapical pleural based scarring Review of the MIP images confirms the above findings CTA HEAD FINDINGS Anterior circulation: Mild atherosclerotic plaque along the carotid siphons. There is a severe right M2 branch occlusion at the level of the insula with the downstream distal right M3 branch occlusion. Atherosclerotic irregularity of medium size vessels. Negative for aneurysm. Bilateral early branching MCA. Posterior circulation: Fenestrated left V4 segment and proximal basilar. 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 Symmetric flow in the posterior cerebral arteries. Venous sinuses: Negative Anatomic variants: As above Review of the MIP images confirms the above findings CT Brain Perfusion Findings: ASPECTS: 10 CBF (<30%) Volume: 0mL Perfusion (Tmax>6.0s) volume: 13mL These results were called by telephone at the time of interpretation on 09/30/2019 at 9:01 am to provider Arther Dames , who verbally acknowledged these results. IMPRESSION: 1. 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 This is associated with 13 cc of penumbra and even greater area of relative ischemia when using the strictest T-max parameters. 2. No flow limiting stenosis or embolic source seen in the more proximal neck. 3. 4 mm right PICA aneurysm. The left V4 segment and basilar are fenestrated. Electronically Signed   By: Marnee Spring M.D.   On: 09/30/2019 09:10   DG Chest Portable 1 View  Result Date: 09/30/2019 CLINICAL DATA:  Stroke, altered mental status, weakness, hypertension. EXAM: PORTABLE CHEST 1 VIEW COMPARISON:  CT angiogram chest/abdomen/pelvis 02/22/2018 FINDINGS: Heart size within normal limits.  Aortic atherosclerosis. There is no airspace consolidation within the lungs. No evidence of pleural effusion or pneumothorax. No acute bony abnormality. Degenerative  change of the thoracic spine. Surgical clips project over the lower neck. Overlying cardiac monitoring leads. IMPRESSION: No airspace consolidation. Aortic atherosclerosis. Electronically Signed   By: Jackey Loge DO   On: 09/30/2019 13:15   ECHOCARDIOGRAM COMPLETE  Result Date: 09/30/2019   ECHOCARDIOGRAM REPORT   Patient Name:   Belinda Anderson Date of Exam: 09/30/2019 Medical Rec #:  [id number]       Height:       64.0 in Accession #:    [id number]      Weight:       149.0 lb Date of Birth:  [birth year]       BSA:          1.73 m Patient Age:    87 years        BP:           155/94 mmHg Patient Gender: F               HR:           77 bpm. Exam Location:  Inpatient Procedure: 2D Echo, Cardiac Doppler and Color Doppler Indications:    Stroke.  History:        Patient has prior history of Echocardiogram examinations, most                 recent 10/31/2005. Stroke, Signs/Symptoms:Syncope and                 Dizziness/Lightheadedness; Risk Factors:Dyslipidemia and  Hypertension.  Sonographer:    Sheralyn Boatman RDCS Referring Phys: [id number] Lafe Garin IMPRESSIONS  1. Left ventricular ejection fraction, by visual estimation, is 65 to 70%. The left ventricle has normal function. There is moderately increased left ventricular hypertrophy.  2. Small left ventricular internal cavity size.  3. The left ventricle has no regional wall motion abnormalities.  4. Global right ventricle has normal systolic function.The right ventricular size is normal. No increase in right ventricular wall thickness.  5. Left atrial size was moderately dilated.  6. Right atrial size was normal.  7. The mitral valve is normal in structure. Mild mitral valve regurgitation.  8. The tricuspid valve is normal in structure. Tricuspid valve regurgitation is mild.  9. The aortic valve is tricuspid. Aortic valve regurgitation is not visualized. Mild to moderate aortic valve sclerosis/calcification without any evidence of aortic stenosis.  10. Moderate calcifications of AV leaflets. No stenosis. 11. The pulmonic valve was not well visualized. Pulmonic valve regurgitation is not visualized. 12. Mildly elevated pulmonary artery systolic pressure. FINDINGS  Left Ventricle: Left ventricular ejection fraction, by visual estimation, is 65 to 70%. The left ventricle has normal function. The left ventricle has no regional wall motion abnormalities. The left ventricular internal cavity size was the LV cavity size is small. There is moderately increased left ventricular hypertrophy. Right Ventricle: The right ventricular size is normal. No increase in right ventricular wall thickness. Global RV systolic function is has normal systolic function. The tricuspid regurgitant velocity is 2.58 m/s, and with an assumed right atrial pressure  of 8 mmHg, the estimated right ventricular systolic pressure is mildly elevated at 34.5 mmHg. Left Atrium: Left atrial size was moderately dilated. Right Atrium: Right atrial size was normal in size Pericardium: There is no evidence of pericardial effusion. Mitral Valve: The mitral valve is normal in structure. Mild mitral valve regurgitation. Tricuspid Valve: The tricuspid valve is normal in structure. Tricuspid valve regurgitation is mild. Aortic Valve: The aortic valve is tricuspid. Aortic valve regurgitation is not visualized. Mild to moderate aortic valve sclerosis/calcification is present, without any evidence of aortic stenosis. Moderate calcifications of AV leaflets. No stenosis. Pulmonic Valve: The pulmonic valve was not well visualized. Pulmonic valve regurgitation is not visualized. Pulmonic regurgitation is not visualized. Aorta: The aortic root is normal in size and structure. IAS/Shunts: No atrial level shunt detected by color flow Doppler.  LEFT VENTRICLE PLAX 2D LVIDd:         3.61 cm       Diastology LVIDs:         2.27 cm       LV e' lateral:   6.96 cm/s LV PW:         1.21 cm       LV E/e' lateral: 15.1 LV IVS:         1.67 cm       LV e' medial:    5.98 cm/s LVOT diam:     1.90 cm       LV E/e' medial:  17.6 LV SV:         37 ml LV SV Index:   21.17 LVOT Area:     2.84 cm  LV Volumes (MOD) LV area d, A2C:    23.30 cm LV area d, A4C:    18.20 cm LV area s, A2C:    10.30 cm LV area s, A4C:    7.63 cm LV major d, A2C:   7.16 cm LV major d,  A4C:   6.07 cm LV major s, A2C:   5.37 cm LV major s, A4C:   5.27 cm LV vol d, MOD A2C: 63.3 ml LV vol d, MOD A4C: 44.1 ml LV vol s, MOD A2C: 16.9 ml LV vol s, MOD A4C: 9.3 ml LV SV MOD A2C:     46.4 ml LV SV MOD A4C:     44.1 ml LV SV MOD BP:      44.3 ml RIGHT VENTRICLE             IVC RV S prime:     15.40 cm/s  IVC diam: 2.25 cm TAPSE (M-mode): 2.3 cm LEFT ATRIUM             Index       RIGHT ATRIUM           Index LA diam:        3.90 cm 2.26 cm/m  RA Area:     10.50 cm LA Vol (A2C):   81.7 ml 47.33 ml/m RA Volume:   19.90 ml  11.53 ml/m LA Vol (A4C):   62.3 ml 36.09 ml/m LA Biplane Vol: 73.8 ml 42.75 ml/m  AORTIC VALVE LVOT Vmax:   121.00 cm/s LVOT Vmean:  80.500 cm/s LVOT VTI:    0.262 m  AORTA Ao Root diam: 3.30 cm Ao Asc diam:  3.40 cm MITRAL VALVE                         TRICUSPID VALVE MV Area (PHT): 2.76 cm              TR Peak grad:   26.5 mmHg MV PHT:        79.75 msec            TR Vmax:        287.00 cm/s MV Decel Time: 275 msec MV E velocity: 105.00 cm/s 103 cm/s  SHUNTS MV A velocity: 79.50 cm/s  70.3 cm/s Systemic VTI:  0.26 m MV E/A ratio:  1.32        1.5       Systemic Diam: 1.90 cm  Dietrich PatesPaula Ross MD Electronically signed by Dietrich PatesPaula Ross MD Signature Date/Time: 09/30/2019/3:25:43 PM    Final    CT HEAD CODE STROKE WO CONTRAST  Result Date: 09/30/2019 CLINICAL DATA:  Code stroke. Focal neuro deficit, greater than 6 hours, stroke suspected. Additional history provided: Facial droop, left-sided weakness, history of brain aneurysm. EXAM: CT HEAD WITHOUT CONTRAST TECHNIQUE: Contiguous axial images were obtained from the base of the skull through the vertex without  intravenous contrast. COMPARISON:  MRI/MRA head 06/27/2013, head CT 04/10/2013 FINDINGS: Brain: No evidence of acute intracranial hemorrhage. No demarcated cortical infarction. No evidence of intracranial mass. No midline shift or extra-axial fluid collection. Moderate patchy hypodensity within the cerebral white matter is nonspecific, but consistent with chronic small vessel ischemic disease. Redemonstrated chronic lacunar infarct within the inferior left basal ganglia. 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 Mild generalized parenchymal atrophy. Vascular: No hyperdense vessel or unexpected calcification. Skull: Normal. Negative for fracture or focal lesion. Sinuses/Orbits: Rightward gaze deviation. No significant paranasal sinus disease or mastoid effusion at the imaged levels. These results were communicated to Dr. Laurence SlateAroor at 8:35 amon 12/14/2020by text page via the [facility] messaging system. IMPRESSION: No CT evidence of acute intracranial abnormality. Stable generalized parenchymal atrophy and chronic small vessel ischemic disease. Redemonstrated chronic  lacunar infarct within the inferior left basal ganglia. 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 Electronically Signed   By: Kellie Simmering DO   On: 09/30/2019 08:37   VAS [place] LOWER EXTREMITY VENOUS (DVT)  Result Date: 10/01/2019  Lower Venous Study Indications: Embolic stroke.  Risk Factors: Surgery 09/30/2019 - IR WITH ANESTHESIA. Limitations: Poor ultrasound/tissue interface and bandages. Comparison Study: No prior studies. Performing Technologist: Oliver Hum RVT  Examination Guidelines: A complete evaluation includes B-mode imaging, spectral Doppler, color Doppler, and power Doppler as needed of all accessible portions of each vessel. Bilateral testing is considered an integral part of a complete examination. Limited examinations  for reoccurring indications may be performed as noted.  +---------+---------------+---------+-----------+----------+--------------+ RIGHT    CompressibilityPhasicitySpontaneityPropertiesThrombus Aging +---------+---------------+---------+-----------+----------+--------------+ CFV      Full           Yes      Yes                                 +---------+---------------+---------+-----------+----------+--------------+ SFJ      Full                                                        +---------+---------------+---------+-----------+----------+--------------+ FV Prox  Full                                                        +---------+---------------+---------+-----------+----------+--------------+ FV Mid   Full                                                        +---------+---------------+---------+-----------+----------+--------------+ FV DistalFull                                                        +---------+---------------+---------+-----------+----------+--------------+ PFV      Full                                                        +---------+---------------+---------+-----------+----------+--------------+ POP      Full           Yes      Yes                                 +---------+---------------+---------+-----------+----------+--------------+ PTV      Full                                                        +---------+---------------+---------+-----------+----------+--------------+  PERO     Full                                                        +---------+---------------+---------+-----------+----------+--------------+   +---------+---------------+---------+-----------+----------+--------------+ LEFT     CompressibilityPhasicitySpontaneityPropertiesThrombus Aging +---------+---------------+---------+-----------+----------+--------------+ CFV      Full           Yes      Yes                                  +---------+---------------+---------+-----------+----------+--------------+ SFJ      Full                                                        +---------+---------------+---------+-----------+----------+--------------+ FV Prox  Full                                                        +---------+---------------+---------+-----------+----------+--------------+ FV Mid   Full                                                        +---------+---------------+---------+-----------+----------+--------------+ FV DistalFull                                                        +---------+---------------+---------+-----------+----------+--------------+ PFV      Full                                                        +---------+---------------+---------+-----------+----------+--------------+ POP      Full           Yes      Yes                                 +---------+---------------+---------+-----------+----------+--------------+ PTV      Full                                                        +---------+---------------+---------+-----------+----------+--------------+ PERO     Full                                                        +---------+---------------+---------+-----------+----------+--------------+  Summary: Right: There is no evidence of deep vein thrombosis in the lower extremity. However, portions of this examination were limited- see technologist comments above. No cystic structure found in the popliteal fossa. Left: There is no evidence of deep vein thrombosis in the lower extremity. No cystic structure found in the popliteal fossa.  *See table(s) above for measurements and observations.    Preliminary    Cerebral Angio 09/30/2019 S/P RT common carotid arteriogram followed by complete revascularization of of occluded mid   M2 seg of a codominant inf division with x 1 pass with 3mm x 33mm trevuprovue retriver device and  penumbra aspiration achieving a TICI 3 revascularization.  PHYSICAL EXAM  Temp:  [97.5 F (36.4 C)-98.2 F (36.8 C)] 97.5 F (36.4 C) (12/15 0800) Pulse Rate:  [56-100] 72 (12/15 1100) Resp:  [13-32] 16 (12/15 1100) BP: (88-214)/(49-195) 115/100 (12/15 1100) SpO2:  [91 %-100 %] 96 % (12/15 1100)  General - Well nourished, well developed, in no apparent distress.  Ophthalmologic - fundi not visualized due to noncooperation.  Cardiovascular - Regular rhythm and rate.  Mental Status -  Level of arousal and orientation to year, place, and person were intact, but not orientated to month. Language including expression, naming, repetition, comprehension was assessed and found intact.  Cranial Nerves II - XII - II - Visual field intact OU. III, IV, VI - Extraocular movements intact. V - Facial sensation intact bilaterally. VII - Facial movement intact bilaterally. VIII - Hearing & vestibular intact bilaterally. X - Palate elevates symmetrically. XI - Chin turning & shoulder shrug intact bilaterally. XII - Tongue protrusion intact.  Motor Strength - The patient's strength was normal in all extremities and pronator drift was absent.  Bulk was normal and fasciculations were absent.   Motor Tone - Muscle tone was assessed at the neck and appendages and was normal.  Reflexes - The patient's reflexes were symmetrical in all extremities and she had no pathological reflexes.  Sensory - Light touch, temperature/pinprick were assessed and were symmetrical.    Coordination - The patient had normal movements in the hands with no ataxia or dysmetria.  Tremor was absent.  Gait and Station - deferred.   ASSESSMENT/PLAN Belinda Anderson is a 85 y.o. female with history of hypertension, arthritis, thyroid disease, dyslipidemia, PICA aneurysm and mild memory issues presenting with slurred speech, facial droop, gaze deviation, visual field neglect, L facial palsy, L arm and leg weakness,  sensory extinction. Sent to IR where occluded R M2 was revascularized.   Stroke:  right MCA infarcts due to right M2 near occlusion s/p IR with TICI3 revascularization, infarcts embolic secondary to unclear source  Code Stroke CT head No acute abnormality. Small vessel disease. Atrophy. Old L basal ganglia infarct. Chronic lesions B globus pallidus.   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 Neck ok. 4mm R PICA aneurysm.  CT perfusion 13mm penumbra  Cerebral angio mid R M2 occlusion w/ TICI3 revascularization  Post IR CT head No ICH or mass effect seen.  MRI  Few small infarcts R MCA (R insular cortex, subinsular, superior R temporal lobe and cortex R postcentrul gyrus). Small vessel disease. Atrophy. Chronic B globus pallidus lesions.  2D Echo EF 65-70%. No source of embolus   LE venous dopplers no DVT  Recommend loop recorder to rule out afib  LDL 88  HgbA1c 5.9  SCDs for VTE prophylaxis  aspirin 325 mg daily prior to admission, now on  aspirin 325 mg daily and clopidogrel 75 mg daily. Continue DAPT for 3 months and then plavix alone  Therapy recommendations:  pending    Disposition:  pending   Hypertension  Home meds:  norvasc 2.5, lasix 40  Stable, but on the low end . Long-term BP goal normotensive  Hyperlipidemia  Home meds:  pravachol 40  LDL 88, goal < 70  Change pravachol to lipitor 40  Continue statin at discharge  Other Stroke Risk Factors  Advanced age  Other Active Problems  Baseline cognitive deficits  Thyroid disease on synthroid  Arthritis  R PICA aneurysm  Acute blood loss anemia post IR 13.1-12.9-11.6  CKD stage IIIa, Cre 1.77-1.6-1.52  Hospital day # 1  This patient is critically ill due to stroke s/p EVT and at significant risk of neurological worsening, death form recurrent stroke, hemorrhagic conversion, heart failure, seizure. This patient's care requires constant monitoring of vital signs,  hemodynamics, respiratory and cardiac monitoring, review of multiple databases, neurological assessment, discussion with family, other specialists and medical decision making of high complexity. I spent 35 minutes of neurocritical care time in the care of this patient.  Marvel Plan, MD PhD Stroke Neurology 10/01/2019 11:43 AM   To contact Stroke Continuity provider, please refer to [email or web address]. After hours, contact General Neurology

## 2019-10-01 NOTE — Progress Notes (Signed)
Pt daughter, Lelan Pons, asked this RN about DNR paperwork in sealed file that was brought with patient to hospital by EMS. There was no paperwork given to [facility] when patient arrived from IR on 12/14 AM. Dr. Erlinda Hong aware that daughter states pt is supposed to be a DNR and should be a DNR in the system. See associated orders.

## 2019-10-01 NOTE — Evaluation (Signed)
Physical Therapy Evaluation Patient Details Name: Belinda Anderson MRN: [id number] DOB: 09-11-1932 Today's Date: 10/01/2019   History of Present Illness  Belinda Anderson is an 85 y.o. female with a history of hypertension, arthritis, thyroid disease and dyslipidemia. Previously known PICA aneurysm.  pt admitted viaEMS with worsening facial droop during transport to the hospital. Initial NIHSS 13 by the Stroke Team, unable to answer orientation questions correctly, gaze deviation, visual field neglect, left facial palsy, dysarthria, slight left arm and leg weakness, sensory extinction.  pt underwent revascularization of occluded M2 segment of R MCA.  MRI post procedure showing small acute infarcts withing the R MCA territory.  Clinical Impression  Pt admitted with/for stroke and was revascularized on admission with some residual stroke in the R MCA territory.  Pt needing min guard to min assist depending on her use of AD in a homelike environment.  Pt should be supervised up front for impulsivities, distractibility and mild gait instability..  Pt currently limited functionally due to the problems listed below.  (see problems list.)  Pt will benefit from PT to maximize function and safety to be able to get home safely with available assist .     Follow Up Recommendations Home health PT;Other (comment);Supervision/Assistance - 24 hour(stepped up supervision initially)    Equipment Recommendations       Recommendations for Other Services       Precautions / Restrictions Precautions Precautions: Fall(impulsiveness)      Mobility  Bed Mobility Overal bed mobility: Modified Independent             General bed mobility comments: impulsive, but generally safe  Transfers Overall transfer level: Needs assistance   Transfers: Sit to/from Stand Sit to Stand: Supervision         General transfer comment: no assist needed, but stabilized on the bed  frame  Ambulation/Gait Ambulation/Gait assistance: Min guard Gait Distance (Feet): 200 Feet Assistive device: IV Pole;None Gait Pattern/deviations: Step-through pattern;Decreased stance time - left;Decreased step length - right Gait velocity: slower   General Gait Details: mildly unsteady at times with mild hip drop on the right that pt/dtr state is similar to normal.  Pt's stability degrades without AD  Stairs            Wheelchair Mobility    Modified Rankin (Stroke Patients Only) Modified Rankin (Stroke Patients Only) Pre-Morbid Rankin Score: No symptoms Modified Rankin: Slight disability     Balance                                             Pertinent Vitals/Pain Pain Assessment: No/denies pain    Home Living Family/patient expects to be discharged to:: Private residence Living Arrangements: Alone Available Help at Discharge: Family;Available PRN/intermittently Type of Home: House Home Access: Level entry;Stairs to enter   Entrance Stairs-Number of Steps: small threshold step Home Layout: One level Home Equipment: Cane - single point;Grab bars - tub/shower      Prior Function Level of Independence: Independent with assistive device(s)         Comments: does not drive, but get out with her daughter     Hand Dominance        Extremity/Trunk Assessment        Lower Extremity Assessment Lower Extremity Assessment: Overall WFL for tasks assessed(left mild proximal weakness > right side.)       Communication  Communication: No difficulties  Cognition Arousal/Alertness: Awake/alert Behavior During Therapy: Impulsive(distractable) Overall Cognitive Status: History of cognitive impairments - at baseline                                        General Comments      Exercises     Assessment/Plan    PT Assessment Patient needs continued PT services  PT Problem List Decreased strength;Decreased activity  tolerance;Decreased balance;Decreased mobility;Decreased knowledge of use of DME;Decreased safety awareness       PT Treatment Interventions DME instruction;Gait training;Functional mobility training;Therapeutic activities;Balance training;Neuromuscular re-education;Patient/family education    PT Goals (Current goals can be found in the Care Plan section)  Acute Rehab PT Goals Patient Stated Goal: I'm ready to go home PT Goal Formulation: With patient Time For Goal Achievement: 10/15/19 Potential to Achieve Goals: Good    Frequency Min 3X/week   Barriers to discharge        Co-evaluation               AM-PAC PT "6 Clicks" Mobility  Outcome Measure Help needed turning from your back to your side while in a flat bed without using bedrails?: None Help needed moving from lying on your back to sitting on the side of a flat bed without using bedrails?: None Help needed moving to and from a bed to a chair (including a wheelchair)?: None Help needed standing up from a chair using your arms (e.g., wheelchair or bedside chair)?: A Little Help needed to walk in hospital room?: A Little Help needed climbing 3-5 steps with a railing? : A Little 6 Click Score: 21    End of Session   Activity Tolerance: Patient tolerated treatment well Patient left: in bed;Other (comment)(awaiting OT) Nurse Communication: Mobility status PT Visit Diagnosis: Unsteadiness on feet (R26.81);Other symptoms and signs involving the nervous system (R29.898);Other abnormalities of gait and mobility (R26.89)    Time: 1914-7829 PT Time Calculation (min) (ACUTE ONLY): 40 min   Charges:   PT Evaluation $PT Eval Moderate Complexity: 1 Mod PT Treatments $Gait Training: 8-22 mins $Neuromuscular Re-education: 8-22 mins        10/01/2019  Jacinto Halim., PT Acute Rehabilitation Services [phone]  (pager) [phone]  (office)  Belinda Anderson 10/01/2019, 2:47 PM

## 2019-10-01 NOTE — Consult Note (Addendum)
ELECTROPHYSIOLOGY CONSULT NOTE  Patient ID: Belinda Anderson MRN: [id number], DOB/AGE: [birth year]   Admit date: 09/30/2019 Date of Consult: 10/02/2019  Primary Physician: [name], DO Primary Cardiologist: No primary care provider on file.  Primary Electrophysiologist: New to None  Reason for Consultation: Cryptogenic stroke; recommendations regarding Implantable Loop Recorder  History of Present Illness EP has been asked to evaluate Belinda Anderson for placement of an implantable loop recorder to monitor for atrial fibrillation by Dr Roda Shutters.  The patient was admitted on 09/30/2019 with slurred speech, facial droop, gaze deviation, visual filed neglect, L facial palsy, and L arm and leg weakness with sensory extinction.  They first developed symptoms while at home.  Imaging demonstrated occluded R M2, which was revascularized in IR.  They have undergone workup for stroke including echocardiogram and CTA Head and Neck with no flow limiting stenosis or ulceration in either carotid. Mild wall thickening of the R common carotid. The patient has been monitored on telemetry which has demonstrated paroxysmal atrial fibrillation.  Inpatient stroke work-up Babara [place] not require a TEE per Neurology.   Echocardiogram this admission demonstrated LVEF 65-70%.  Lab work is reviewed.  Prior to admission, the patient denies chest pain, shortness of breath, dizziness, palpitations, or syncope.  They are recovering from their stroke with unclear disposition at this time. PT has recommended HHPT with 24 hr supervision.   Past Medical History:  Diagnosis Date   Arthritis    Hypertension    Thyroid disease      Surgical History:  Past Surgical History:  Procedure Laterality Date   EYE SURGERY     lens implant   RADIOLOGY WITH ANESTHESIA N/A 09/30/2019   Procedure: IR WITH ANESTHESIA;  Surgeon: Julieanne Cotton, MD;  Location: [facility] OR;  Service: Radiology;  Laterality: N/A;     Medications Prior  to Admission  Medication Sig Dispense Refill Last Dose   Acetaminophen (TYLENOL ARTHRITIS PAIN PO) Take 2 tablets by mouth every 6 (six) hours as needed (pain).       amLODipine (NORVASC) 2.5 MG tablet TAKE 1 TABLET BY MOUTH ONCE DAILY 90 tablet 1    aspirin EC 325 MG EC tablet Take 325 mg by mouth daily.        diclofenac sodium (VOLTAREN) 1 % GEL Apply 4 g topically 4 (four) times daily. 100 g 2    furosemide (LASIX) 40 MG tablet Take 1 tablet (40 mg total) by mouth daily. 90 tablet 3    levothyroxine (SYNTHROID) 75 MCG tablet TAKE 1 TABLET(75 MCG) BY MOUTH DAILY 90 tablet 3    NONFORMULARY OR COMPOUNDED ITEM Compression stockings 20-30 mm hg  #1  Dx edema 1 each 0    potassium chloride SA (K-DUR) 20 MEQ tablet Take 1 tablet (20 mEq total) by mouth daily. 90 tablet 1    pravastatin (PRAVACHOL) 40 MG tablet TAKE 1 TABLET(40 MG) BY MOUTH DAILY 90 tablet 1     Inpatient Medications:    stroke: mapping our early stages of recovery book   Does not apply Once   aspirin EC  325 mg Oral Daily   atorvastatin  40 mg Oral q1800   Chlorhexidine Gluconate Cloth  6 each Topical Daily   clopidogrel  75 mg Oral Daily   levothyroxine  75 mcg Oral Q0600    Allergies: No Known Allergies  Social History   Socioeconomic History   Marital status: Widowed    Spouse name: Not on file   Number  of children: Not on file   Years of education: Not on file   Highest education level: Not on file  Occupational History   Not on file  Tobacco Use   Smoking status: Never Smoker   Smokeless tobacco: Never Used  Substance and Sexual Activity   Alcohol use: No   Drug use: No   Sexual activity: Not Currently    Partners: Male  Other Topics Concern   Not on file  Social History Narrative   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, Data processing manager   Social Determinants of Health   Financial Resource Strain:    Difficulty of Paying Living Expenses: Not on file  Food Insecurity:    Worried About  Programme researcher, broadcasting/film/video in the Last Year: Not on file   [employer] of Food in the Last Year: Not on file  Transportation Needs:    Lack of Transportation (Medical): Not on file   Lack of Transportation (Non-Medical): Not on file  Physical Activity:    Days of Exercise per Week: Not on file   Minutes of Exercise per Session: Not on file  Stress:    Feeling of Stress : Not on file  Social Connections:    Frequency of Communication with Friends and Family: Not on file   Frequency of Social Gatherings with Friends and Family: Not on file   Attends Religious Services: Not on file   Active Member of Clubs or Organizations: Not on file   Attends Banker Meetings: Not on file   Marital Status: Not on file  Intimate Partner Violence:    Fear of Current or Ex-Partner: Not on file   Emotionally Abused: Not on file   Physically Abused: Not on file   Sexually Abused: Not on file     Family History  Problem Relation Age of Onset   Arthritis Mother    Heart disease Mother    Heart disease Father 10       MI   Coronary artery disease Other    Diabetes Brother        DI      Review of Systems: All other systems reviewed and are otherwise negative except as noted above.  Physical Exam: Vitals:   10/01/19 1000 10/01/19 1030 10/01/19 1100 10/01/19 1200  BP: (!) 117/49 (!) 108/97 (!) 115/100 (!) 113/94  Pulse: 71 68 72 74  Resp: (!) 26 13 16 19   Temp:      TempSrc:      SpO2: 93% 96% 96% 94%  Weight:      Height:        GEN- The patient is well appearing, alert and oriented x 3 today.   Head- normocephalic, atraumatic Eyes-  Sclera clear, conjunctiva pink Ears- hearing intact Oropharynx- clear Neck- supple Lungs- Clear to ausculation bilaterally, normal work of breathing Heart- Regular rate and rhythm, no murmurs, rubs or gallops  GI- soft, NT, ND, + BS Extremities- no clubbing, cyanosis, or edema MS- no significant deformity or atrophy Skin- no rash or lesion Psych-  euthymic mood, full affect   Labs:   Lab Results  Component Value Date   WBC 4.6 10/01/2019   HGB 11.6 (L) 10/01/2019   HCT 35.6 (L) 10/01/2019   MCV 94.7 10/01/2019   PLT 177 10/01/2019    Recent Labs  Lab 09/30/19 0815 10/01/19 0530  NA 141 140  K 3.9 4.1  CL 107 111  CO2 22 19*  BUN  24* 21  CREATININE 1.77* 1.52*  CALCIUM 9.2 8.8*  PROT 6.6  --   BILITOT 0.8  --   ALKPHOS 104  --   ALT 13  --   AST 23  --   GLUCOSE 113* 120*     Radiology/Studies: CT Angio Head W or Wo Contrast  Result Date: 09/30/2019 CLINICAL DATA:  Facial droop and left-sided weakness EXAM: CT ANGIOGRAPHY HEAD AND NECK CT PERFUSION BRAIN TECHNIQUE: Multidetector CT imaging of the head and neck was performed using the standard protocol during bolus administration of intravenous contrast. Multiplanar CT image reconstructions and MIPs were obtained to evaluate the vascular anatomy. Carotid stenosis measurements (when applicable) are obtained utilizing NASCET criteria, using the distal internal carotid diameter as the denominator. Multiphase CT imaging of the brain was performed following IV bolus contrast injection. Subsequent parametric perfusion maps were calculated using RAPID software. CONTRAST:  OMNIPAQUE IOHEXOL 350 MG/ML SOLN COMPARISON:  Head CT earlier today.  Brain MRI 06/27/2013 FINDINGS: CTA NECK FINDINGS Aortic arch: 2 vessel branching. Right carotid system: Atheromatous wall thickening of the common carotid to the level of the bifurcation. No flow limiting stenosis or ulceration. Negative for beading. Left carotid system: Calcified plaque at the bifurcation that is mild. No stenosis or ulceration. Vertebral arteries: Mild subclavian atherosclerosis on the right, more moderate on the left, without flow limiting stenosis or ulceration. Mild-to-moderate atheromatous narrowing at the left vertebral origin due to calcified plaque. Skeleton: Diffuse degenerative disease of the cervical spine. Other  neck: No acute or aggressive finding. Postoperative right thyroid. Upper chest: Biapical pleural based scarring Review of the MIP images confirms the above findings CTA HEAD FINDINGS Anterior circulation: Mild atherosclerotic plaque along the carotid siphons. There is a severe right M2 branch occlusion at the level of the insula with the downstream distal right M3 branch occlusion. Atherosclerotic irregularity of medium size vessels. Negative for aneurysm. Bilateral early branching MCA. Posterior circulation: Fenestrated left V4 segment and proximal basilar. 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 Symmetric flow in the posterior cerebral arteries. Venous sinuses: Negative Anatomic variants: As above Review of the MIP images confirms the above findings CT Brain Perfusion Findings: ASPECTS: 10 CBF (<30%) Volume: 0mL Perfusion (Tmax>6.0s) volume: 13mL These results were called by telephone at the time of interpretation on 09/30/2019 at 9:01 am to provider Arther Dames , who verbally acknowledged these results. IMPRESSION: 1. 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 This is associated with 13 cc of penumbra and even greater area of relative ischemia when using the strictest T-max parameters. 2. No flow limiting stenosis or embolic source seen in the more proximal neck. 3. 4 mm right PICA aneurysm. The left V4 segment and basilar are fenestrated. Electronically Signed   By: Marnee Spring M.D.   On: 09/30/2019 09:10   CT ANGIO NECK W OR WO CONTRAST  Result Date: 09/30/2019 CLINICAL DATA:  Facial droop and left-sided weakness EXAM: CT ANGIOGRAPHY HEAD AND NECK CT PERFUSION BRAIN TECHNIQUE: Multidetector CT imaging of the head and neck was performed using the standard protocol during bolus administration of intravenous contrast. Multiplanar CT image reconstructions and MIPs were obtained to evaluate the vascular anatomy.  Carotid stenosis measurements (when applicable) are obtained utilizing NASCET criteria, using the distal internal carotid diameter as the denominator. Multiphase CT imaging of the brain was performed following IV bolus contrast injection. Subsequent parametric perfusion maps were calculated using  RAPID software. CONTRAST:  OMNIPAQUE IOHEXOL 350 MG/ML SOLN COMPARISON:  Head CT earlier today.  Brain MRI 06/27/2013 FINDINGS: CTA NECK FINDINGS Aortic arch: 2 vessel branching. Right carotid system: Atheromatous wall thickening of the common carotid to the level of the bifurcation. No flow limiting stenosis or ulceration. Negative for beading. Left carotid system: Calcified plaque at the bifurcation that is mild. No stenosis or ulceration. Vertebral arteries: Mild subclavian atherosclerosis on the right, more moderate on the left, without flow limiting stenosis or ulceration. Mild-to-moderate atheromatous narrowing at the left vertebral origin due to calcified plaque. Skeleton: Diffuse degenerative disease of the cervical spine. Other neck: No acute or aggressive finding. Postoperative right thyroid. Upper chest: Biapical pleural based scarring Review of the MIP images confirms the above findings CTA HEAD FINDINGS Anterior circulation: Mild atherosclerotic plaque along the carotid siphons. There is a severe right M2 branch occlusion at the level of the insula with the downstream distal right M3 branch occlusion. Atherosclerotic irregularity of medium size vessels. Negative for aneurysm. Bilateral early branching MCA. Posterior circulation: Fenestrated left V4 segment and proximal basilar. 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 Symmetric flow in the posterior cerebral arteries. Venous sinuses: Negative Anatomic variants: As above Review of the MIP images confirms the above findings CT Brain Perfusion Findings: ASPECTS: 10 CBF (<30%) Volume: 62mL  Perfusion (Tmax>6.0s) volume: 76mL These results were called by telephone at the time of interpretation on 09/30/2019 at 9:01 am to provider Arther Dames , who verbally acknowledged these results. IMPRESSION: 1. 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 This is associated with 13 cc of penumbra and even greater area of relative ischemia when using the strictest T-max parameters. 2. No flow limiting stenosis or embolic source seen in the more proximal neck. 3. 4 mm right PICA aneurysm. The left V4 segment and basilar are fenestrated. Electronically Signed   By: Marnee Spring M.D.   On: 09/30/2019 09:10   MR BRAIN WO CONTRAST  Result Date: 09/30/2019 CLINICAL DATA:  Stroke, follow-up. Additional history provided: New onset speech difficulty and left-sided weakness, underwent revascularization EXAM: MRI HEAD WITHOUT CONTRAST TECHNIQUE: Multiplanar, multiecho pulse sequences of the brain and surrounding structures were obtained without intravenous contrast. COMPARISON:  Non-contrast CT head and CT angiogram head/neck performed earlier the same day 09/30/2019, brain MRI 06/27/2013 FINDINGS: Brain: Multiple sequences are motion degraded. Most notably, there is moderate motion degradation of the axial T2 weighted sequence and axial T2 FLAIR sequence, as well as severe motion degradation of the axial T1 weighted sequence. There are several small acute infarcts within the right MCA vascular territory as follows. 10 mm acute infarct within the right insular cortex (series 3, image 24). A subcentimeter acute infarct is also present within the adjacent right subinsular region (series 3, images 25-26). Subcentimeter acute cortical infarct within the superior right temporal lobe (series 3, image 22). Small acute infarct within the lateral right postcentral gyrus (series 30, image 38). No evidence of intracranial mass. No midline shift or extra-axial fluid collection. No chronic intracranial  blood products. Moderate chronic small vessel ischemic disease. Redemonstrated T2 hyperintense lesions within the globus pallidus bilaterally consistent with sequela of remote insult. Mild generalized parenchymal atrophy Vascular: Flow voids maintained within the proximal large arterial vessels. Please refer to CTA head/neck performed earlier the same day for description of a 4 mm right PICA aneurysm. Skull and upper cervical spine: No focal marrow  lesion. Incompletely assessed upper cervical spondylosis. Sinuses/Orbits: Bilateral lens replacements. No significant paranasal sinus disease or mastoid effusion at the imaged levels. IMPRESSION: Significantly motion degraded examination. There are a few small acute infarcts within the right MCA territory measuring up to 10 mm. These infarcts are located within the right insular cortex, right subinsular region, superior right temporal lobe cortex and cortex of the right postcentral gyrus. Stable generalized parenchymal atrophy and chronic small vessel ischemic disease. Redemonstrated symmetric lesions within the globus pallidus bilaterally. Findings are consistent with sequela of remote insult, possibly carbon monoxide poisoning or other anoxic brain injury. Electronically Signed   By: Jackey Loge DO   On: 09/30/2019 16:45   CT CEREBRAL PERFUSION W CONTRAST  Result Date: 09/30/2019 CLINICAL DATA:  Facial droop and left-sided weakness EXAM: CT ANGIOGRAPHY HEAD AND NECK CT PERFUSION BRAIN TECHNIQUE: Multidetector CT imaging of the head and neck was performed using the standard protocol during bolus administration of intravenous contrast. Multiplanar CT image reconstructions and MIPs were obtained to evaluate the vascular anatomy. Carotid stenosis measurements (when applicable) are obtained utilizing NASCET criteria, using the distal internal carotid diameter as the denominator. Multiphase CT imaging of the brain was performed following IV bolus contrast injection.  Subsequent parametric perfusion maps were calculated using RAPID software. CONTRAST:  OMNIPAQUE IOHEXOL 350 MG/ML SOLN COMPARISON:  Head CT earlier today.  Brain MRI 06/27/2013 FINDINGS: CTA NECK FINDINGS Aortic arch: 2 vessel branching. Right carotid system: Atheromatous wall thickening of the common carotid to the level of the bifurcation. No flow limiting stenosis or ulceration. Negative for beading. Left carotid system: Calcified plaque at the bifurcation that is mild. No stenosis or ulceration. Vertebral arteries: Mild subclavian atherosclerosis on the right, more moderate on the left, without flow limiting stenosis or ulceration. Mild-to-moderate atheromatous narrowing at the left vertebral origin due to calcified plaque. Skeleton: Diffuse degenerative disease of the cervical spine. Other neck: No acute or aggressive finding. Postoperative right thyroid. Upper chest: Biapical pleural based scarring Review of the MIP images confirms the above findings CTA HEAD FINDINGS Anterior circulation: Mild atherosclerotic plaque along the carotid siphons. There is a severe right M2 branch occlusion at the level of the insula with the downstream distal right M3 branch occlusion. Atherosclerotic irregularity of medium size vessels. Negative for aneurysm. Bilateral early branching MCA. Posterior circulation: Fenestrated left V4 segment and proximal basilar. 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 Symmetric flow in the posterior cerebral arteries. Venous sinuses: Negative Anatomic variants: As above Review of the MIP images confirms the above findings CT Brain Perfusion Findings: ASPECTS: 10 CBF (<30%) Volume: 0mL Perfusion (Tmax>6.0s) volume: 13mL These results were called by telephone at the time of interpretation on 09/30/2019 at 9:01 am to provider Arther Dames , who verbally acknowledged these results. IMPRESSION: 1. Severe right M2 stenosis with  downstream right M3 branch occlusion. This is associated with 13 cc of penumbra and even greater area of relative ischemia when using the strictest T-max parameters. 2. No flow limiting stenosis or embolic source seen in the more proximal neck. 3. 4 mm right PICA aneurysm. The left V4 segment and basilar are fenestrated. Electronically Signed   By: Marnee Spring M.D.   On: 09/30/2019 09:10   DG Chest Portable 1 View  Result Date: 09/30/2019 CLINICAL DATA:  Stroke, altered mental status, weakness, hypertension. EXAM: PORTABLE CHEST 1 VIEW COMPARISON:  CT angiogram chest/abdomen/pelvis 02/22/2018 FINDINGS: Heart size within  normal limits.  Aortic atherosclerosis. There is no airspace consolidation within the lungs. No evidence of pleural effusion or pneumothorax. No acute bony abnormality. Degenerative change of the thoracic spine. Surgical clips project over the lower neck. Overlying cardiac monitoring leads. IMPRESSION: No airspace consolidation. Aortic atherosclerosis. Electronically Signed   By: Jackey LogeKyle  Golden DO   On: 09/30/2019 13:15   ECHOCARDIOGRAM COMPLETE  Result Date: 09/30/2019   ECHOCARDIOGRAM REPORT   Patient Name:   Belinda MessLIZABETH Anderson Date of Exam: 09/30/2019 Medical Rec #:  [id number]       Height:       64.0 in Accession #:    [id number]      Weight:       149.0 lb Date of Birth:  [birth year]       BSA:          1.73 m Patient Age:    87 years        BP:           155/94 mmHg Patient Gender: F               HR:           77 bpm. Exam Location:  Inpatient Procedure: 2D Echo, Cardiac Doppler and Color Doppler Indications:    Stroke.  History:        Patient has prior history of Echocardiogram examinations, most                 recent 10/31/2005. Stroke, Signs/Symptoms:Syncope and                 Dizziness/Lightheadedness; Risk Factors:Dyslipidemia and                 Hypertension.  Sonographer:    Sheralyn Boatmanina West RDCS Referring Phys: [id number] Lafe GarinKARISSA M LACLAIR IMPRESSIONS  1. Left ventricular ejection  fraction, by visual estimation, is 65 to 70%. The left ventricle has normal function. There is moderately increased left ventricular hypertrophy.  2. Small left ventricular internal cavity size.  3. The left ventricle has no regional wall motion abnormalities.  4. Global right ventricle has normal systolic function.The right ventricular size is normal. No increase in right ventricular wall thickness.  5. Left atrial size was moderately dilated.  6. Right atrial size was normal.  7. The mitral valve is normal in structure. Mild mitral valve regurgitation.  8. The tricuspid valve is normal in structure. Tricuspid valve regurgitation is mild.  9. The aortic valve is tricuspid. Aortic valve regurgitation is not visualized. Mild to moderate aortic valve sclerosis/calcification without any evidence of aortic stenosis. 10. Moderate calcifications of AV leaflets. No stenosis. 11. The pulmonic valve was not well visualized. Pulmonic valve regurgitation is not visualized. 12. Mildly elevated pulmonary artery systolic pressure. FINDINGS  Left Ventricle: Left ventricular ejection fraction, by visual estimation, is 65 to 70%. The left ventricle has normal function. The left ventricle has no regional wall motion abnormalities. The left ventricular internal cavity size was the LV cavity size is small. There is moderately increased left ventricular hypertrophy. Right Ventricle: The right ventricular size is normal. No increase in right ventricular wall thickness. Global RV systolic function is has normal systolic function. The tricuspid regurgitant velocity is 2.58 m/s, and with an assumed right atrial pressure  of 8 mmHg, the estimated right ventricular systolic pressure is mildly elevated at 34.5 mmHg. Left Atrium: Left atrial size was moderately dilated. Right Atrium: Right atrial size was normal in size Pericardium: There is no  evidence of pericardial effusion. Mitral Valve: The mitral valve is normal in structure. Mild mitral  valve regurgitation. Tricuspid Valve: The tricuspid valve is normal in structure. Tricuspid valve regurgitation is mild. Aortic Valve: The aortic valve is tricuspid. Aortic valve regurgitation is not visualized. Mild to moderate aortic valve sclerosis/calcification is present, without any evidence of aortic stenosis. Moderate calcifications of AV leaflets. No stenosis. Pulmonic Valve: The pulmonic valve was not well visualized. Pulmonic valve regurgitation is not visualized. Pulmonic regurgitation is not visualized. Aorta: The aortic root is normal in size and structure. IAS/Shunts: No atrial level shunt detected by color flow Doppler.  LEFT VENTRICLE PLAX 2D LVIDd:         3.61 cm       Diastology LVIDs:         2.27 cm       LV e' lateral:   6.96 cm/s LV PW:         1.21 cm       LV E/e' lateral: 15.1 LV IVS:        1.67 cm       LV e' medial:    5.98 cm/s LVOT diam:     1.90 cm       LV E/e' medial:  17.6 LV SV:         37 ml LV SV Index:   21.17 LVOT Area:     2.84 cm  LV Volumes (MOD) LV area d, A2C:    23.30 cm LV area d, A4C:    18.20 cm LV area s, A2C:    10.30 cm LV area s, A4C:    7.63 cm LV major d, A2C:   7.16 cm LV major d, A4C:   6.07 cm LV major s, A2C:   5.37 cm LV major s, A4C:   5.27 cm LV vol d, MOD A2C: 63.3 ml LV vol d, MOD A4C: 44.1 ml LV vol s, MOD A2C: 16.9 ml LV vol s, MOD A4C: 9.3 ml LV SV MOD A2C:     46.4 ml LV SV MOD A4C:     44.1 ml LV SV MOD BP:      44.3 ml RIGHT VENTRICLE             IVC RV S prime:     15.40 cm/s  IVC diam: 2.25 cm TAPSE (M-mode): 2.3 cm LEFT ATRIUM             Index       RIGHT ATRIUM           Index LA diam:        3.90 cm 2.26 cm/m  RA Area:     10.50 cm LA Vol (A2C):   81.7 ml 47.33 ml/m RA Volume:   19.90 ml  11.53 ml/m LA Vol (A4C):   62.3 ml 36.09 ml/m LA Biplane Vol: 73.8 ml 42.75 ml/m  AORTIC VALVE LVOT Vmax:   121.00 cm/s LVOT Vmean:  80.500 cm/s LVOT VTI:    0.262 m  AORTA Ao Root diam: 3.30 cm Ao Asc diam:  3.40 cm MITRAL VALVE                          TRICUSPID VALVE MV Area (PHT): 2.76 cm              TR Peak grad:   26.5 mmHg MV PHT:        79.75 msec  TR Vmax:        287.00 cm/s MV Decel Time: 275 msec MV E velocity: 105.00 cm/s 103 cm/s  SHUNTS MV A velocity: 79.50 cm/s  70.3 cm/s Systemic VTI:  0.26 m MV E/A ratio:  1.32        1.5       Systemic Diam: 1.90 cm  Dietrich Pates MD Electronically signed by Dietrich Pates MD Signature Date/Time: 09/30/2019/3:25:43 PM    Final    CT HEAD CODE STROKE WO CONTRAST  Result Date: 09/30/2019 CLINICAL DATA:  Code stroke. Focal neuro deficit, greater than 6 hours, stroke suspected. Additional history provided: Facial droop, left-sided weakness, history of brain aneurysm. EXAM: CT HEAD WITHOUT CONTRAST TECHNIQUE: Contiguous axial images were obtained from the base of the skull through the vertex without intravenous contrast. COMPARISON:  MRI/MRA head 06/27/2013, head CT 04/10/2013 FINDINGS: Brain: No evidence of acute intracranial hemorrhage. No demarcated cortical infarction. No evidence of intracranial mass. No midline shift or extra-axial fluid collection. Moderate patchy hypodensity within the cerebral white matter is nonspecific, but consistent with chronic small vessel ischemic disease. Redemonstrated chronic lacunar infarct within the inferior left basal ganglia. 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 Mild generalized parenchymal atrophy. Vascular: No hyperdense vessel or unexpected calcification. Skull: Normal. Negative for fracture or focal lesion. Sinuses/Orbits: Rightward gaze deviation. No significant paranasal sinus disease or mastoid effusion at the imaged levels. These results were communicated to Dr. Laurence Slate at 8:35 amon 12/14/2020by text page via the [facility] messaging system. IMPRESSION: No CT evidence of acute intracranial abnormality. Stable generalized parenchymal atrophy and chronic small vessel ischemic disease.  Redemonstrated chronic lacunar infarct within the inferior left basal ganglia. 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 Electronically Signed   By: Jackey Loge DO   On: 09/30/2019 08:37   VAS [place] LOWER EXTREMITY VENOUS (DVT)  Result Date: 10/01/2019  Lower Venous Study Indications: Embolic stroke.  Risk Factors: Surgery 09/30/2019 - IR WITH ANESTHESIA. Limitations: Poor ultrasound/tissue interface and bandages. Comparison Study: No prior studies. Performing Technologist: Chanda Busing RVT  Examination Guidelines: A complete evaluation includes B-mode imaging, spectral Doppler, color Doppler, and power Doppler as needed of all accessible portions of each vessel. Bilateral testing is considered an integral part of a complete examination. Limited examinations for reoccurring indications may be performed as noted.  +---------+---------------+---------+-----------+----------+--------------+  RIGHT     Compressibility Phasicity Spontaneity Properties Thrombus Aging  +---------+---------------+---------+-----------+----------+--------------+  CFV       Full            Yes       Yes                                    +---------+---------------+---------+-----------+----------+--------------+  SFJ       Full                                                             +---------+---------------+---------+-----------+----------+--------------+  FV Prox   Full                                                             +---------+---------------+---------+-----------+----------+--------------+  FV Mid    Full                                                             +---------+---------------+---------+-----------+----------+--------------+  FV Distal Full                                                             +---------+---------------+---------+-----------+----------+--------------+  PFV       Full                                                              +---------+---------------+---------+-----------+----------+--------------+  POP       Full            Yes       Yes                                    +---------+---------------+---------+-----------+----------+--------------+  PTV       Full                                                             +---------+---------------+---------+-----------+----------+--------------+  PERO      Full                                                             +---------+---------------+---------+-----------+----------+--------------+   +---------+---------------+---------+-----------+----------+--------------+  LEFT      Compressibility Phasicity Spontaneity Properties Thrombus Aging  +---------+---------------+---------+-----------+----------+--------------+  CFV       Full            Yes       Yes                                    +---------+---------------+---------+-----------+----------+--------------+  SFJ       Full                                                             +---------+---------------+---------+-----------+----------+--------------+  FV Prox   Full                                                             +---------+---------------+---------+-----------+----------+--------------+  FV Mid    Full                                                             +---------+---------------+---------+-----------+----------+--------------+  FV Distal Full                                                             +---------+---------------+---------+-----------+----------+--------------+  PFV       Full                                                             +---------+---------------+---------+-----------+----------+--------------+  POP       Full            Yes       Yes                                    +---------+---------------+---------+-----------+----------+--------------+  PTV       Full                                                              +---------+---------------+---------+-----------+----------+--------------+  PERO      Full                                                             +---------+---------------+---------+-----------+----------+--------------+     Summary: Right: There is no evidence of deep vein thrombosis in the lower extremity. However, portions of this examination were limited- see technologist comments above. No cystic structure found in the popliteal fossa. Left: There is no evidence of deep vein thrombosis in the lower extremity. No cystic structure found in the popliteal fossa.  *See table(s) above for measurements and observations.    Preliminary     12-lead ECG 12/15 shows NSR at 84 bpm (personally reviewed) All prior EKG's in EPIC reviewed with no documented atrial fibrillation  Telemetry NSR this am 60-70s, with paroxysms of AF 90-120s (personally reviewed)  Assessment and Plan:  1. Cryptogenic stroke -> new diagnosis of paroxysmal atrial fibrillation The patient presents with cryptogenic stroke.  The patient does not have a TEE planned for this AM.  The patient has demonstrated paroxysmal atrial fibrillation on the monitor, and is currently in NSR.  Ozelle Brubacher start Eliquis for CHA2DS2-VASc Score and unadjusted Ischemic Stroke Rate (% per year) of at least  9.7 % stroke rate/year from a score of 6 (HTN, Female, Age >59, h/o Stroke) Lanae Federer schedule follow  up in EP clinic.  Shirley Friar, PA-C 10/01/2019 12:44 PM  I have seen and examined this patient with Oda Kilts.  Agree with above, note added to reflect my findings.  On exam, RRR, no murmurs, lungs clear.  Patient presented to [facility] with cryptogenic stroke.  She is overall improving.  In review of her cardiac monitor, she has been going in and out of atrial fibrillation.  This is potentially the cause of her stroke.  We Corinda Ammon thus plan to start Eliquis.  We Jayline [place]burg arrange for follow-up in EP clinic.  Odessia Asleson M. Jaquila Santelli  MD 10/02/2019 2:58 PM

## 2019-10-01 NOTE — Progress Notes (Signed)
Bilateral lower extremity venous duplex has been completed. Preliminary results can be found in CV Proc through chart review.   10/01/19 9:43 AM Carlos Levering RVT

## 2019-10-02 DIAGNOSIS — I480 Paroxysmal atrial fibrillation: Secondary | ICD-10-CM | POA: Diagnosis not present

## 2019-10-02 DIAGNOSIS — I4891 Unspecified atrial fibrillation: Secondary | ICD-10-CM | POA: Diagnosis not present

## 2019-10-02 DIAGNOSIS — I63411 Cerebral infarction due to embolism of right middle cerebral artery: Principal | ICD-10-CM

## 2019-10-02 DIAGNOSIS — E039 Hypothyroidism, unspecified: Secondary | ICD-10-CM

## 2019-10-02 DIAGNOSIS — E785 Hyperlipidemia, unspecified: Secondary | ICD-10-CM

## 2019-10-02 MED ORDER — APIXABAN 2.5 MG PO TABS
2.5000 mg | ORAL_TABLET | Freq: Two times a day (BID) | ORAL | Status: DC
  Administered 2019-10-02: 2.5 mg via ORAL
  Filled 2019-10-02: qty 1

## 2019-10-02 MED ORDER — APIXABAN 2.5 MG PO TABS: 2.5000 mg | ORAL_TABLET | Freq: Two times a day (BID) | ORAL | 2 refills | Status: DC

## 2019-10-02 MED ORDER — ATORVASTATIN CALCIUM 40 MG PO TABS: 40.0000 mg | ORAL_TABLET | Freq: Every day | ORAL | 2 refills | Status: DC

## 2019-10-02 NOTE — Progress Notes (Signed)
The chaplain visited with the patient and prayed with her after nurse's referral.  The patient expressed a desire to go home and talked with the chaplain about her home and family.  The chaplain listened and prayed over the patient.  The chaplain will follow-up if needed.  Brion Aliment Chaplain Resident For questions concerning this note please contact me by pager [phone]

## 2019-10-02 NOTE — Progress Notes (Signed)
Patient being discharged home with home health. Education and information provided to patient and pt daughter Lelan Pons). IV removed. CCMD notified. All belongings with patient. Pt leaving unit via wheelchair.

## 2019-10-02 NOTE — TOC Transition Note (Signed)
Transition of Care [facility]) - CM/SW Discharge Note   Patient Details  Name: Belinda Anderson MRN: [id number] Date of Birth: 26-Jan-1932  Transition of [facility]) CM/SW Contact:  Pollie Friar, RN Phone Number: 10/02/2019, 11:16 AM   Clinical Narrative:    Patient discharging home with [facility] services through [place]. Butch Penny with [facility] accepted the referral.  Pt discharging on Eliquis. CM provided her 30 day free card and she will f/u with her pharmacy on the co pays after the initial 30 days.  Pt has supervision at home. Pts sister is going to stay with her.  Daughter to provide transport home.   Final next level of care: Home w Home Health Services Barriers to Discharge: No Barriers Identified   Patient Goals and CMS Choice   CMS Medicare.gov Compare Post Acute Care list provided to:: Patient Choice offered to / list presented to : Patient  Discharge Placement                       Discharge Plan and Services                          HH Arranged: PT, OT, Speech Therapy [place] Agency: [place] ([facility]) Date [place]: 10/02/19   Representative spoke with at [place]: [place] ([place]) Interventions     Readmission Risk Interventions No flowsheet data found.

## 2019-10-02 NOTE — Progress Notes (Signed)
ANTICOAGULATION CONSULT NOTE - Initial Consult  Pharmacy Consult for Eliquis Indication: atrial fibrillation  No Known Allergies  Patient Measurements: Height: 5\' 4"  (162.6 cm) Weight: 149 lb (67.6 kg) IBW/kg (Calculated) : 54.7  Vital Signs: Temp: 98 F (36.7 C) (12/16 0812) Temp Source: Axillary (12/16 0812) BP: 142/74 (12/16 0812) Pulse Rate: 75 (12/16 0812)  Labs: Recent Labs    09/30/19 0815 09/30/19 0843 10/01/19 0530  HGB 13.1 12.9 11.6*  HCT 40.8 38.0 35.6*  PLT 233  --  177  APTT 25  --   --   LABPROT 13.2  --   --   INR 1.0  --   --   CREATININE 1.77* 1.60* 1.52*    Estimated Creatinine Clearance: 25.6 mL/min (A) (by C-G formula based on SCr of 1.52 mg/dL (H)).   Medical History: Past Medical History:  Diagnosis Date  . Arthritis   . Hypertension   . Thyroid disease    Assessment: 33 YOF presenting as code stroke, with R-MCA infarcts s/p IR 12/14, outside tPA window.  Not on anticoagulation PTA, now with Afib per EP and to start anticoagulation with Eliquis.  Currently on DAPT.  Age >80, SCr 1.52 (BL usually >1.5), Wt 67.6kg  Goal of Therapy:  Monitor platelets by anticoagulation protocol: Yes   Plan:  Eliquis 2.5mg  PO BID (d/t age and CKD3 with SCr >1.5) D/c DAPT per neuro Monitor renal function, s/s bleeding  Bertis Ruddy, PharmD Clinical Pharmacist Please check AMION for all [facility] numbers 10/02/2019 9:19 AM

## 2019-10-02 NOTE — Progress Notes (Signed)
Occupational Therapy Treatment Patient Details Name: Belinda Anderson MRN: [id number] DOB: 1932/07/13 Today's Date: 10/02/2019    History of present illness Belinda Anderson is an 85 y.o. female with a history of hypertension, arthritis, thyroid disease and dyslipidemia. Previously known PICA aneurysm.  pt admitted viaEMS with worsening facial droop during transport to the hospital. Initial NIHSS 13 by the Stroke Team, unable to answer orientation questions correctly, gaze deviation, visual field neglect, left facial palsy, dysarthria, slight left arm and leg weakness, sensory extinction.  pt underwent revascularization of occluded M2 segment of R MCA.  MRI post procedure showing small acute infarcts withing the R MCA territory.   OT comments  Pt making steady progress towards OT goals this session. Session focus on functional mobility, standing grooming, IADL safety and LB dressing. Overall, pt requires min guard- supervision for functional mobility and transfers with RW. Pt slightly impulsive as noted by pt impulsively standing from EOB. Pt requires cues for IADL safety as noted by pt leaving water running after standing grooming needing cues to reorient to mistake. Pt able to locate dropped items on floor and safely problem solve gathering items. Pt unable to recall any of the words recited to pt at start of session. Pt reports her sister lives next door, education provided on the need for 24 hour assist for safety. DC plan currently remains appropriate contingent on 24 hour assist at home. Will continue to follow acutely per POC.    Follow Up Recommendations  Home health OT;Supervision/Assistance - 24 hour;Other (comment)(24/7 supervision for safety)    Equipment Recommendations  None recommended by OT    Recommendations for Other Services      Precautions / Restrictions Precautions Precautions: Fall Precaution Comments: impulsive Restrictions Weight Bearing Restrictions: No        Mobility Bed Mobility Overal bed mobility: Modified Independent             General bed mobility comments: impulsive at times and needing safety cues  Transfers Overall transfer level: Needs assistance Equipment used: Rolling walker (2 wheeled) Transfers: Sit to/from Stand Sit to Stand: Modified independent (Device/Increase time)         General transfer comment: stood impulsively from EOB with no assist    Balance Overall balance assessment: Mild deficits observed, not formally tested                                         ADL either performed or assessed with clinical judgement   ADL Overall ADL's : Needs assistance/impaired     Grooming: Oral care;Standing;Supervision/safety;Cueing for safety Grooming Details (indicate cue type and reason): cues to locate needed items d/t pt not having glasses on             Lower Body Dressing: Min guard;Sitting/lateral leans Lower Body Dressing Details (indicate cue type and reason): min guard to pull up socks from EOB Toilet Transfer: Min guard;Ambulation;RW Toilet Transfer Details (indicate cue type and reason): simulated to recliner; min guard for safety         Functional mobility during ADLs: Min guard;Rolling walker General ADL Comments: session focus on standing grooming tasks, functional mobility, IADL safety and LB dressing. Pt slightly impulsive with movement but able to locate unsafe situations in room and problem solve how to correct situation. pt reprots completing light IADLs at home and reports her sister lives next door and is younger and  able to check on her. Continued to reinforce that pt would need 24 hour assist for safety as pt impulsive overall     Vision Baseline Vision/History: Wears glasses Wears Glasses: Reading only Patient Visual Report: No change from baseline     Perception     Praxis      Cognition Arousal/Alertness: Awake/alert Behavior During Therapy:  Impulsive Overall Cognitive Status: Impaired/Different from baseline Area of Impairment: Orientation;Attention;Memory;Following commands;Safety/judgement;Awareness;Problem solving                 Orientation Level: Disoriented to;Time Current Attention Level: Focused Memory: Decreased short-term memory Following Commands: Follows one step commands consistently Safety/Judgement: Decreased awareness of deficits;Decreased awareness of safety Awareness: Emergent Problem Solving: Slow processing;Difficulty sequencing;Requires verbal cues;Requires tactile cues General Comments: pt standing impulsively from bed and unable to recall 3 words stated at start of session. Pt requires cues for safety throughout session as pt noted to walk away from sink without turning off water. Pt required vc to orient to mistake.        Exercises     Shoulder Instructions       General Comments HR max 91 bpm with functional mobility    Pertinent Vitals/ Pain       Pain Assessment: No/denies pain  Home Living                                          Prior Functioning/Environment              Frequency  Min 2X/week        Progress Toward Goals  OT Goals(current goals can now be found in the care plan section)  Progress towards OT goals: Progressing toward goals  Acute Rehab OT Goals Patient Stated Goal: I'm ready to go home OT Goal Formulation: With patient Time For Goal Achievement: 10/15/19 Potential to Achieve Goals: Good  Plan Discharge plan remains appropriate    Co-evaluation                 AM-PAC OT "6 Clicks" Daily Activity     Outcome Measure   Help from another person eating meals?: A Little Help from another person taking care of personal grooming?: A Little Help from another person toileting, which includes using toliet, bedpan, or urinal?: A Little Help from another person bathing (including washing, rinsing, drying)?: A Little Help  from another person to put on and taking off regular upper body clothing?: A Little Help from another person to put on and taking off regular lower body clothing?: A Little 6 Click Score: 18    End of Session Equipment Utilized During Treatment: Rolling walker  OT Visit Diagnosis: Unsteadiness on feet (R26.81);Other abnormalities of gait and mobility (R26.89);Other symptoms and signs involving cognitive function   Activity Tolerance Patient tolerated treatment well   Patient Left in chair;with call bell/phone within reach   Nurse Communication Mobility status        Time: 1610-9604 OT Time Calculation (min): 17 min  Charges: OT General Charges $OT Visit: 1 Visit OT Treatments $Self Care/Home Management : 8-22 mins  Audery Amel., COTA/L Acute Rehabilitation Services [phone]    Angelina Pih 10/02/2019, 12:22 PM

## 2019-10-02 NOTE — Progress Notes (Signed)
Physical Therapy Treatment Patient Details Name: Belinda Anderson MRN: [id number] DOB: Nov 23, 1931 Today's Date: 10/02/2019    History of Present Illness Belinda Anderson is an 85 y.o. female with a history of hypertension, arthritis, thyroid disease and dyslipidemia. Previously known PICA aneurysm.  pt admitted viaEMS with worsening facial droop during transport to the hospital. Initial NIHSS 13 by the Stroke Team, unable to answer orientation questions correctly, gaze deviation, visual field neglect, left facial palsy, dysarthria, slight left arm and leg weakness, sensory extinction.  pt underwent revascularization of occluded M2 segment of R MCA.  MRI post procedure showing small acute infarcts withing the R MCA territory.    PT Comments    Pt progressing well towards her physical therapy goals. Ambulating 500 feet with cane at a supervision level. Recommended use of cane for all mobility over level/unlevel surfaces for improved stability and increased independence. D/c plan remains appropriate.    Follow Up Recommendations  Home health PT;Supervision/Assistance - 24 hour     Equipment Recommendations  None recommended by PT    Recommendations for Other Services       Precautions / Restrictions Precautions Precautions: Fall Precaution Comments: impulsive Restrictions Weight Bearing Restrictions: No    Mobility  Bed Mobility Overal bed mobility: Modified Independent             General bed mobility comments: OOB in chair  Transfers Overall transfer level: Modified independent Equipment used: None Transfers: Sit to/from Stand Sit to Stand: Modified independent (Device/Increase time)         General transfer comment: stood impulsively from EOB with no assist  Ambulation/Gait Ambulation/Gait assistance: Supervision Gait Distance (Feet): 500 Feet Assistive device: Straight cane Gait Pattern/deviations: Step-through pattern;Decreased stride length Gait velocity:  slower   General Gait Details: Pt grossly supervision with mobility, occasional right drift but able to correct.    Stairs             Wheelchair Mobility    Modified Rankin (Stroke Patients Only) Modified Rankin (Stroke Patients Only) Pre-Morbid Rankin Score: No symptoms Modified Rankin: Moderately severe disability     Balance Overall balance assessment: Mild deficits observed, not formally tested                                          Cognition Arousal/Alertness: Awake/alert Behavior During Therapy: WFL for tasks assessed/performed Overall Cognitive Status: Impaired/Different from baseline Area of Impairment: Memory;Safety/judgement;Awareness                 Orientation Level: Disoriented to;Time Current Attention Level: Focused Memory: Decreased short-term memory Following Commands: Follows one step commands consistently Safety/Judgement: Decreased awareness of deficits;Decreased awareness of safety Awareness: Emergent Problem Solving: Slow processing;Difficulty sequencing;Requires verbal cues;Requires tactile cues General Comments: pt standing impulsively from bed and unable to recall 3 words stated at start of session. Pt requires cues for safety throughout session as pt noted to walk away from sink without turning off water. Pt required vc to orient to mistake.      Exercises      General Comments General comments (skin integrity, edema, etc.): HR max 91 bpm with functional mobility      Pertinent Vitals/Pain Pain Assessment: Faces Faces Pain Scale: No hurt    Home Living                      Prior Function  PT Goals (current goals can now be found in the care plan section) Acute Rehab PT Goals Patient Stated Goal: "do my word searches." Potential to Achieve Goals: Good Progress towards PT goals: Progressing toward goals    Frequency    Min 3X/week      PT Plan Current plan remains  appropriate    Co-evaluation              AM-PAC PT "6 Clicks" Mobility   Outcome Measure  Help needed turning from your back to your side while in a flat bed without using bedrails?: None Help needed moving from lying on your back to sitting on the side of a flat bed without using bedrails?: None Help needed moving to and from a bed to a chair (including a wheelchair)?: None Help needed standing up from a chair using your arms (e.g., wheelchair or bedside chair)?: None Help needed to walk in hospital room?: None Help needed climbing 3-5 steps with a railing? : A Little 6 Click Score: 23    End of Session Equipment Utilized During Treatment: Gait belt Activity Tolerance: Patient tolerated treatment well Patient left: in chair;with call bell/phone within reach Nurse Communication: Mobility status PT Visit Diagnosis: Unsteadiness on feet (R26.81);Other symptoms and signs involving the nervous system (R29.898);Other abnormalities of gait and mobility (R26.89)     Time: 1829-9371 PT Time Calculation (min) (ACUTE ONLY): 16 min  Charges:  $Therapeutic Activity: 8-22 mins                     Laurina Bustle, PT, DPT Acute Rehabilitation Services Pager [phone] Office [phone]    Vanetta Mulders 10/02/2019, 3:13 PM

## 2019-10-02 NOTE — Discharge Summary (Addendum)
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 RADIOLOGY WITH ANESTHESIA N/A 09/30/2019   Procedure: IR WITH ANESTHESIA;  Surgeon: Julieanne Cotton, MD;  Location: [facility] OR;  Service: Radiology;  Laterality: N/A;    Allergies as of 10/02/2019   No Known Allergies     Medication List    STOP taking these medications   aspirin EC 325 MG tablet   NONFORMULARY OR COMPOUNDED ITEM   pravastatin 40 MG tablet Commonly known as: PRAVACHOL     TAKE these medications   acetaminophen 650 MG CR tablet Commonly known as: TYLENOL Take 1,300 mg by mouth 3 (three) times daily.   amLODipine 2.5 MG tablet Commonly known as: NORVASC TAKE 1 TABLET BY MOUTH ONCE DAILY What changed:   how much to take  how to take this  when to take this  additional instructions   apixaban 2.5 MG Tabs tablet Commonly known as: ELIQUIS Take 1 tablet (2.5 mg total) by mouth 2 (two) times daily.   atorvastatin 40 MG tablet Commonly known as: LIPITOR Take 1 tablet (40 mg total) by mouth daily at 6 PM.   diclofenac sodium 1 % Gel Commonly known as: VOLTAREN Apply 4 g topically 4 (four) times daily. What changed:   when to take this  reasons to take this   furosemide 40 MG tablet Commonly known as: LASIX Take 1 tablet (40 mg total) by mouth daily.    levothyroxine 75 MCG tablet Commonly known as: SYNTHROID TAKE 1 TABLET(75 MCG) BY MOUTH DAILY What changed:   how much to take  how to take this  when to take this  additional instructions   potassium chloride SA 20 MEQ tablet Commonly known as: KLOR-CON Take 1 tablet (20 mEq total) by mouth daily.       LABORATORY STUDIES CBC    Component Value Date/Time   WBC 4.6 10/01/2019 0530   RBC 3.76 (L) 10/01/2019 0530   HGB 11.6 (L) 10/01/2019 0530   HCT 35.6 (L) 10/01/2019 0530   PLT 177 10/01/2019 0530   MCV 94.7 10/01/2019 0530   MCH 30.9 10/01/2019 0530   MCHC 32.6 10/01/2019 0530   RDW 13.2 10/01/2019 0530   LYMPHSABS 1.0 10/01/2019 0530   MONOABS 0.6 10/01/2019 0530   EOSABS 0.0 10/01/2019 0530   BASOSABS 0.0 10/01/2019 0530   CMP    Component Value Date/Time   NA 140 10/01/2019 0530   K 4.1 10/01/2019 0530   CL 111 10/01/2019 0530   CO2 19 (L) 10/01/2019 0530   GLUCOSE 120 (H) 10/01/2019 0530   BUN 21 10/01/2019 0530   CREATININE 1.52 (H) 10/01/2019 0530   CALCIUM 8.8 (L) 10/01/2019 0530   PROT 6.6 09/30/2019 0815   ALBUMIN 3.9 09/30/2019 0815   AST 23 09/30/2019 0815   ALT  13 09/30/2019 0815   ALKPHOS 104 09/30/2019 0815   BILITOT 0.8 09/30/2019 0815   GFRNONAA 31 (L) 10/01/2019 0530   GFRAA 36 (L) 10/01/2019 0530   COAGS Lab Results  Component Value Date   INR 1.0 09/30/2019   INR 1.03 07/10/2013   Lipid Panel    Component Value Date/Time   CHOL 142 10/01/2019 0530   TRIG 54 10/01/2019 0530   HDL 43 10/01/2019 0530   CHOLHDL 3.3 10/01/2019 0530   VLDL 11 10/01/2019 0530   LDLCALC 88 10/01/2019 0530   HgbA1C  Lab Results  Component Value Date   HGBA1C 5.9 (H) 10/01/2019   Urinalysis    Component Value Date/Time   COLORURINE STRAW (A) 09/30/2019 0859   APPEARANCEUR CLEAR 09/30/2019 0859   LABSPEC 1.006 09/30/2019 0859   PHURINE 6.0 09/30/2019 0859   GLUCOSEU NEGATIVE 09/30/2019 0859   GLUCOSEU NEGATIVE 02/29/2012 0804   HGBUR  SMALL (A) 09/30/2019 0859   HGBUR trace-lysed 11/24/2009 0915   BILIRUBINUR NEGATIVE 09/30/2019 0859   BILIRUBINUR Neg 02/20/2013 1415   KETONESUR NEGATIVE 09/30/2019 0859   PROTEINUR NEGATIVE 09/30/2019 0859   UROBILINOGEN 0.2 02/20/2013 1415   UROBILINOGEN 0.2 02/29/2012 0804   NITRITE NEGATIVE 09/30/2019 0859   LEUKOCYTESUR NEGATIVE 09/30/2019 0859   Urine Drug Screen     Component Value Date/Time   LABOPIA NONE DETECTED 09/30/2019 0859   COCAINSCRNUR NONE DETECTED 09/30/2019 0859   LABBENZ NONE DETECTED 09/30/2019 0859   AMPHETMU NONE DETECTED 09/30/2019 0859   THCU NONE DETECTED 09/30/2019 0859   LABBARB NONE DETECTED 09/30/2019 0859    Alcohol Level    Component Value Date/Time   ETH <10 09/30/2019 1150    SIGNIFICANT DIAGNOSTIC STUDIES CT Angio Head W or Wo Contrast  Result Date: 09/30/2019 CLINICAL DATA:  Facial droop and left-sided weakness EXAM: CT ANGIOGRAPHY HEAD AND NECK CT PERFUSION BRAIN TECHNIQUE: Multidetector CT imaging of the head and neck was performed using the standard protocol during bolus administration of intravenous contrast. Multiplanar CT image reconstructions and MIPs were obtained to evaluate the vascular anatomy. Carotid stenosis measurements (when applicable) are obtained utilizing NASCET criteria, using the distal internal carotid diameter as the denominator. Multiphase CT imaging of the brain was performed following IV bolus contrast injection. Subsequent parametric perfusion maps were calculated using RAPID software. CONTRAST:  OMNIPAQUE IOHEXOL 350 MG/ML SOLN COMPARISON:  Head CT earlier today.  Brain MRI 06/27/2013 FINDINGS: CTA NECK FINDINGS Aortic arch: 2 vessel branching. Right carotid system: Atheromatous wall thickening of the common carotid to the level of the bifurcation. No flow limiting stenosis or ulceration. Negative for beading. Left carotid system: Calcified plaque at the bifurcation that is mild. No stenosis or ulceration.  Vertebral arteries: Mild subclavian atherosclerosis on the right, more moderate on the left, without flow limiting stenosis or ulceration. Mild-to-moderate atheromatous narrowing at the left vertebral origin due to calcified plaque. Skeleton: Diffuse degenerative disease of the cervical spine. Other neck: No acute or aggressive finding. Postoperative right thyroid. Upper chest: Biapical pleural based scarring Review of the MIP images confirms the above findings CTA HEAD FINDINGS Anterior circulation: Mild atherosclerotic plaque along the carotid siphons. There is a severe right M2 branch occlusion at the level of the insula with the downstream distal right M3 branch occlusion. Atherosclerotic irregularity of medium size vessels. Negative for aneurysm. Bilateral early branching MCA. Posterior circulation: Fenestrated left V4 segment and proximal basilar. There is a superiorly directed right PICA aneurysm which measures 4 mm base to  dome on coronal reformats. Symmetric flow in the posterior cerebral arteries. Venous sinuses: Negative Anatomic variants: As above Review of the MIP images confirms the above findings CT Brain Perfusion Findings: ASPECTS: 10 CBF (<30%) Volume: 93mL Perfusion (Tmax>6.0s) volume: 7mL These results were called by telephone at the time of interpretation on 09/30/2019 at 9:01 am to provider Samara Snide , who verbally acknowledged these results. IMPRESSION: 1. 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 This is associated with 13 cc of penumbra and even greater area of relative ischemia when using the strictest T-max parameters. 2. No flow limiting stenosis or embolic source seen in the more proximal neck. 3. 4 mm right PICA aneurysm. The left V4 segment and basilar are fenestrated. Electronically Signed   By: Monte Fantasia M.D.   On: 09/30/2019 09:10   CT ANGIO NECK W OR WO CONTRAST  Result Date: 09/30/2019 CLINICAL DATA:  Facial droop and left-sided weakness  EXAM: CT ANGIOGRAPHY HEAD AND NECK CT PERFUSION BRAIN TECHNIQUE: Multidetector CT imaging of the head and neck was performed using the standard protocol during bolus administration of intravenous contrast. Multiplanar CT image reconstructions and MIPs were obtained to evaluate the vascular anatomy. Carotid stenosis measurements (when applicable) are obtained utilizing NASCET criteria, using the distal internal carotid diameter as the denominator. Multiphase CT imaging of the brain was performed following IV bolus contrast injection. Subsequent parametric perfusion maps were calculated using RAPID software. CONTRAST:  170mL OMNIPAQUE IOHEXOL 350 MG/ML SOLN COMPARISON:  Head CT earlier today.  Brain MRI 06/27/2013 FINDINGS: CTA NECK FINDINGS Aortic arch: 2 vessel branching. Right carotid system: Atheromatous wall thickening of the common carotid to the level of the bifurcation. No flow limiting stenosis or ulceration. Negative for beading. Left carotid system: Calcified plaque at the bifurcation that is mild. No stenosis or ulceration. Vertebral arteries: Mild subclavian atherosclerosis on the right, more moderate on the left, without flow limiting stenosis or ulceration. Mild-to-moderate atheromatous narrowing at the left vertebral origin due to calcified plaque. Skeleton: Diffuse degenerative disease of the cervical spine. Other neck: No acute or aggressive finding. Postoperative right thyroid. Upper chest: Biapical pleural based scarring Review of the MIP images confirms the above findings CTA HEAD FINDINGS Anterior circulation: Mild atherosclerotic plaque along the carotid siphons. There is a severe right M2 branch occlusion at the level of the insula with the downstream distal right M3 branch occlusion. Atherosclerotic irregularity of medium size vessels. Negative for aneurysm. Bilateral early branching MCA. Posterior circulation: Fenestrated left V4 segment and proximal basilar. There is a superiorly directed  right PICA aneurysm which measures 4 mm base to dome on coronal reformats. Symmetric flow in the posterior cerebral arteries. Venous sinuses: Negative Anatomic variants: As above Review of the MIP images confirms the above findings CT Brain Perfusion Findings: ASPECTS: 10 CBF (<30%) Volume: 33mL Perfusion (Tmax>6.0s) volume: 76mL These results were called by telephone at the time of interpretation on 09/30/2019 at 9:01 am to provider Samara Snide , who verbally acknowledged these results. IMPRESSION: 1. 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 This is associated with 13 cc of penumbra and even greater area of relative ischemia when using the strictest T-max parameters. 2. No flow limiting stenosis or embolic source seen in the more proximal neck. 3. 4 mm right PICA aneurysm. The left V4 segment and basilar are fenestrated. Electronically Signed   By: Monte Fantasia M.D.   On: 09/30/2019 09:10   MR BRAIN WO CONTRAST  Result  Date: 09/30/2019 CLINICAL DATA:  Stroke, follow-up. Additional history provided: New onset speech difficulty and left-sided weakness, underwent revascularization EXAM: MRI HEAD WITHOUT CONTRAST TECHNIQUE: Multiplanar, multiecho pulse sequences of the brain and surrounding structures were obtained without intravenous contrast. COMPARISON:  Non-contrast CT head and CT angiogram head/neck performed earlier the same day 09/30/2019, brain MRI 06/27/2013 FINDINGS: Brain: Multiple sequences are motion degraded. Most notably, there is moderate motion degradation of the axial T2 weighted sequence and axial T2 FLAIR sequence, as well as severe motion degradation of the axial T1 weighted sequence. There are several small acute infarcts within the right MCA vascular territory as follows. 10 mm acute infarct within the right insular cortex (series 3, image 24). A subcentimeter acute infarct is also present within the adjacent right subinsular region (series 3, images 25-26).  Subcentimeter acute cortical infarct within the superior right temporal lobe (series 3, image 22). Small acute infarct within the lateral right postcentral gyrus (series 30, image 38). No evidence of intracranial mass. No midline shift or extra-axial fluid collection. No chronic intracranial blood products. Moderate chronic small vessel ischemic disease. Redemonstrated T2 hyperintense lesions within the globus pallidus bilaterally consistent with sequela of remote insult. Mild generalized parenchymal atrophy Vascular: Flow voids maintained within the proximal large arterial vessels. Please refer to CTA head/neck performed earlier the same day for description of a 4 mm right PICA aneurysm. Skull and upper cervical spine: No focal marrow lesion. Incompletely assessed upper cervical spondylosis. Sinuses/Orbits: Bilateral lens replacements. No significant paranasal sinus disease or mastoid effusion at the imaged levels. IMPRESSION: Significantly motion degraded examination. There are a few small acute infarcts within the right MCA territory measuring up to 10 mm. These infarcts are located within the right insular cortex, right subinsular region, superior right temporal lobe cortex and cortex of the right postcentral gyrus. Stable generalized parenchymal atrophy and chronic small vessel ischemic disease. Redemonstrated symmetric lesions within the globus pallidus bilaterally. Findings are consistent with sequela of remote insult, possibly carbon monoxide poisoning or other anoxic brain injury. Electronically Signed   By: Jackey Loge DO   On: 09/30/2019 16:45   CT CEREBRAL PERFUSION W CONTRAST  Result Date: 09/30/2019 CLINICAL DATA:  Facial droop and left-sided weakness EXAM: CT ANGIOGRAPHY HEAD AND NECK CT PERFUSION BRAIN TECHNIQUE: Multidetector CT imaging of the head and neck was performed using the standard protocol during bolus administration of intravenous contrast. Multiplanar CT image reconstructions and  MIPs were obtained to evaluate the vascular anatomy. Carotid stenosis measurements (when applicable) are obtained utilizing NASCET criteria, using the distal internal carotid diameter as the denominator. Multiphase CT imaging of the brain was performed following IV bolus contrast injection. Subsequent parametric perfusion maps were calculated using RAPID software. CONTRAST:  OMNIPAQUE IOHEXOL 350 MG/ML SOLN COMPARISON:  Head CT earlier today.  Brain MRI 06/27/2013 FINDINGS: CTA NECK FINDINGS Aortic arch: 2 vessel branching. Right carotid system: Atheromatous wall thickening of the common carotid to the level of the bifurcation. No flow limiting stenosis or ulceration. Negative for beading. Left carotid system: Calcified plaque at the bifurcation that is mild. No stenosis or ulceration. Vertebral arteries: Mild subclavian atherosclerosis on the right, more moderate on the left, without flow limiting stenosis or ulceration. Mild-to-moderate atheromatous narrowing at the left vertebral origin due to calcified plaque. Skeleton: Diffuse degenerative disease of the cervical spine. Other neck: No acute or aggressive finding. Postoperative right thyroid. Upper chest: Biapical pleural based scarring Review of the MIP images confirms the above findings CTA HEAD  FINDINGS Anterior circulation: Mild atherosclerotic plaque along the carotid siphons. There is a severe right M2 branch occlusion at the level of the insula with the downstream distal right M3 branch occlusion. Atherosclerotic irregularity of medium size vessels. Negative for aneurysm. Bilateral early branching MCA. Posterior circulation: Fenestrated left V4 segment and proximal basilar. 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 Symmetric flow in the posterior cerebral arteries. Venous sinuses: Negative Anatomic variants: As above Review of the MIP images confirms the above findings CT Brain  Perfusion Findings: ASPECTS: 10 CBF (<30%) Volume: 0mL Perfusion (Tmax>6.0s) volume: 13mL These results were called by telephone at the time of interpretation on 09/30/2019 at 9:01 am to provider Arther Dames , who verbally acknowledged these results. IMPRESSION: 1. 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 This is associated with 13 cc of penumbra and even greater area of relative ischemia when using the strictest T-max parameters. 2. No flow limiting stenosis or embolic source seen in the more proximal neck. 3. 4 mm right PICA aneurysm. The left V4 segment and basilar are fenestrated. Electronically Signed   By: Marnee Spring M.D.   On: 09/30/2019 09:10   DG Chest Portable 1 View  Result Date: 09/30/2019 CLINICAL DATA:  Stroke, altered mental status, weakness, hypertension. EXAM: PORTABLE CHEST 1 VIEW COMPARISON:  CT angiogram chest/abdomen/pelvis 02/22/2018 FINDINGS: Heart size within normal limits.  Aortic atherosclerosis. There is no airspace consolidation within the lungs. No evidence of pleural effusion or pneumothorax. No acute bony abnormality. Degenerative change of the thoracic spine. Surgical clips project over the lower neck. Overlying cardiac monitoring leads. IMPRESSION: No airspace consolidation. Aortic atherosclerosis. Electronically Signed   By: Jackey Loge DO   On: 09/30/2019 13:15   ECHOCARDIOGRAM COMPLETE  Result Date: 09/30/2019   ECHOCARDIOGRAM REPORT   Patient Name:   NATARSHA HURWITZ Date of Exam: 09/30/2019 Medical Rec #:  [id number]       Height:       64.0 in Accession #:    [id number]      Weight:       149.0 lb Date of Birth:  11-19-1931       BSA:          1.73 m Patient Age:    87 years        BP:           155/94 mmHg Patient Gender: F               HR:           77 bpm. Exam Location:  Inpatient Procedure: 2D Echo, Cardiac Doppler and Color Doppler Indications:    Stroke.  History:        Patient has prior history of Echocardiogram  examinations, most                 recent 10/31/2005. Stroke, Signs/Symptoms:Syncope and                 Dizziness/Lightheadedness; Risk Factors:Dyslipidemia and                 Hypertension.  Sonographer:    Sheralyn Boatman RDCS Referring Phys: [id number] Lafe Garin IMPRESSIONS  1. Left ventricular ejection fraction, by visual estimation, is 65 to 70%. The left ventricle has normal function. There is moderately increased left ventricular hypertrophy.  2. Small left ventricular internal cavity size.  3. The left ventricle has no  regional wall motion abnormalities.  4. Global right ventricle has normal systolic function.The right ventricular size is normal. No increase in right ventricular wall thickness.  5. Left atrial size was moderately dilated.  6. Right atrial size was normal.  7. The mitral valve is normal in structure. Mild mitral valve regurgitation.  8. The tricuspid valve is normal in structure. Tricuspid valve regurgitation is mild.  9. The aortic valve is tricuspid. Aortic valve regurgitation is not visualized. Mild to moderate aortic valve sclerosis/calcification without any evidence of aortic stenosis. 10. Moderate calcifications of AV leaflets. No stenosis. 11. The pulmonic valve was not well visualized. Pulmonic valve regurgitation is not visualized. 12. Mildly elevated pulmonary artery systolic pressure. FINDINGS  Left Ventricle: Left ventricular ejection fraction, by visual estimation, is 65 to 70%. The left ventricle has normal function. The left ventricle has no regional wall motion abnormalities. The left ventricular internal cavity size was the LV cavity size is small. There is moderately increased left ventricular hypertrophy. Right Ventricle: The right ventricular size is normal. No increase in right ventricular wall thickness. Global RV systolic function is has normal systolic function. The tricuspid regurgitant velocity is 2.58 m/s, and with an assumed right atrial pressure  of 8 mmHg, the  estimated right ventricular systolic pressure is mildly elevated at 34.5 mmHg. Left Atrium: Left atrial size was moderately dilated. Right Atrium: Right atrial size was normal in size Pericardium: There is no evidence of pericardial effusion. Mitral Valve: The mitral valve is normal in structure. Mild mitral valve regurgitation. Tricuspid Valve: The tricuspid valve is normal in structure. Tricuspid valve regurgitation is mild. Aortic Valve: The aortic valve is tricuspid. Aortic valve regurgitation is not visualized. Mild to moderate aortic valve sclerosis/calcification is present, without any evidence of aortic stenosis. Moderate calcifications of AV leaflets. No stenosis. Pulmonic Valve: The pulmonic valve was not well visualized. Pulmonic valve regurgitation is not visualized. Pulmonic regurgitation is not visualized. Aorta: The aortic root is normal in size and structure. IAS/Shunts: No atrial level shunt detected by color flow Doppler.  LEFT VENTRICLE PLAX 2D LVIDd:         3.61 cm       Diastology LVIDs:         2.27 cm       LV e' lateral:   6.96 cm/s LV PW:         1.21 cm       LV E/e' lateral: 15.1 LV IVS:        1.67 cm       LV e' medial:    5.98 cm/s LVOT diam:     1.90 cm       LV E/e' medial:  17.6 LV SV:         37 ml LV SV Index:   21.17 LVOT Area:     2.84 cm  LV Volumes (MOD) LV area d, A2C:    23.30 cm LV area d, A4C:    18.20 cm LV area s, A2C:    10.30 cm LV area s, A4C:    7.63 cm LV major d, A2C:   7.16 cm LV major d, A4C:   6.07 cm LV major s, A2C:   5.37 cm LV major s, A4C:   5.27 cm LV vol d, MOD A2C: 63.3 ml LV vol d, MOD A4C: 44.1 ml LV vol s, MOD A2C: 16.9 ml LV vol s, MOD A4C: 9.3 ml LV SV MOD A2C:     46.4  ml LV SV MOD A4C:     44.1 ml LV SV MOD BP:      44.3 ml RIGHT VENTRICLE             IVC RV S prime:     15.40 cm/s  IVC diam: 2.25 cm TAPSE (M-mode): 2.3 cm LEFT ATRIUM             Index       RIGHT ATRIUM           Index LA diam:        3.90 cm 2.26 cm/m  RA Area:     10.50  cm LA Vol (A2C):   81.7 ml 47.33 ml/m RA Volume:   19.90 ml  11.53 ml/m LA Vol (A4C):   62.3 ml 36.09 ml/m LA Biplane Vol: 73.8 ml 42.75 ml/m  AORTIC VALVE LVOT Vmax:   121.00 cm/s LVOT Vmean:  80.500 cm/s LVOT VTI:    0.262 m  AORTA Ao Root diam: 3.30 cm Ao Asc diam:  3.40 cm MITRAL VALVE                         TRICUSPID VALVE MV Area (PHT): 2.76 cm              TR Peak grad:   26.5 mmHg MV PHT:        79.75 msec            TR Vmax:        287.00 cm/s MV Decel Time: 275 msec MV E velocity: 105.00 cm/s 103 cm/s  SHUNTS MV A velocity: 79.50 cm/s  70.3 cm/s Systemic VTI:  0.26 m MV E/A ratio:  1.32        1.5       Systemic Diam: 1.90 cm  Dietrich Pates MD Electronically signed by Dietrich Pates MD Signature Date/Time: 09/30/2019/3:25:43 PM    Final    CT HEAD CODE STROKE WO CONTRAST  Result Date: 09/30/2019 CLINICAL DATA:  Code stroke. Focal neuro deficit, greater than 6 hours, stroke suspected. Additional history provided: Facial droop, left-sided weakness, history of brain aneurysm. EXAM: CT HEAD WITHOUT CONTRAST TECHNIQUE: Contiguous axial images were obtained from the base of the skull through the vertex without intravenous contrast. COMPARISON:  MRI/MRA head 06/27/2013, head CT 04/10/2013 FINDINGS: Brain: No evidence of acute intracranial hemorrhage. No demarcated cortical infarction. No evidence of intracranial mass. No midline shift or extra-axial fluid collection. Moderate patchy hypodensity within the cerebral white matter is nonspecific, but consistent with chronic small vessel ischemic disease. Redemonstrated chronic lacunar infarct within the inferior left basal ganglia. 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 Mild generalized parenchymal atrophy. Vascular: No hyperdense vessel or unexpected calcification. Skull: Normal. Negative for fracture or focal lesion. Sinuses/Orbits: Rightward gaze deviation. No significant paranasal sinus  disease or mastoid effusion at the imaged levels. These results were communicated to Dr. Laurence Slate at 8:35 amon 12/14/2020by text page via the [facility] messaging system. IMPRESSION: No CT evidence of acute intracranial abnormality. Stable generalized parenchymal atrophy and chronic small vessel ischemic disease. Redemonstrated chronic lacunar infarct within the inferior left basal ganglia. 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 Electronically Signed   By: Jackey Loge DO   On: 09/30/2019 08:37   VAS [place] LOWER EXTREMITY VENOUS (DVT)  Result Date: 10/01/2019  Lower Venous Study Indications: Embolic stroke.  Risk Factors:  Surgery 09/30/2019 - IR WITH ANESTHESIA. Limitations: Poor ultrasound/tissue interface and bandages. Comparison Study: No prior studies. Performing Technologist: Chanda BusingGregory Collins RVT  Examination Guidelines: A complete evaluation includes B-mode imaging, spectral Doppler, color Doppler, and power Doppler as needed of all accessible portions of each vessel. Bilateral testing is considered an integral part of a complete examination. Limited examinations for reoccurring indications may be performed as noted.  +---------+---------------+---------+-----------+----------+--------------+ RIGHT    CompressibilityPhasicitySpontaneityPropertiesThrombus Aging +---------+---------------+---------+-----------+----------+--------------+ CFV      Full           Yes      Yes                                 +---------+---------------+---------+-----------+----------+--------------+ SFJ      Full                                                        +---------+---------------+---------+-----------+----------+--------------+ FV Prox  Full                                                        +---------+---------------+---------+-----------+----------+--------------+ FV Mid   Full                                                         +---------+---------------+---------+-----------+----------+--------------+ FV DistalFull                                                        +---------+---------------+---------+-----------+----------+--------------+ PFV      Full                                                        +---------+---------------+---------+-----------+----------+--------------+ POP      Full           Yes      Yes                                 +---------+---------------+---------+-----------+----------+--------------+ PTV      Full                                                        +---------+---------------+---------+-----------+----------+--------------+ PERO     Full                                                        +---------+---------------+---------+-----------+----------+--------------+   +---------+---------------+---------+-----------+----------+--------------+  LEFT     CompressibilityPhasicitySpontaneityPropertiesThrombus Aging +---------+---------------+---------+-----------+----------+--------------+ CFV      Full           Yes      Yes                                 +---------+---------------+---------+-----------+----------+--------------+ SFJ      Full                                                        +---------+---------------+---------+-----------+----------+--------------+ FV Prox  Full                                                        +---------+---------------+---------+-----------+----------+--------------+ FV Mid   Full                                                        +---------+---------------+---------+-----------+----------+--------------+ FV DistalFull                                                        +---------+---------------+---------+-----------+----------+--------------+ PFV      Full                                                         +---------+---------------+---------+-----------+----------+--------------+ POP      Full           Yes      Yes                                 +---------+---------------+---------+-----------+----------+--------------+ PTV      Full                                                        +---------+---------------+---------+-----------+----------+--------------+ PERO     Full                                                        +---------+---------------+---------+-----------+----------+--------------+     Summary: Right: There is no evidence of deep vein thrombosis in the lower extremity. However, portions of this examination were limited- see technologist comments above. No cystic structure found in the popliteal fossa. Left: There is no evidence of deep vein thrombosis in  the lower extremity. No cystic structure found in the popliteal fossa.  *See table(s) above for measurements and observations. Electronically signed by Waverly Ferrari MD on 10/01/2019 at 2:40:52 PM.    Final    Cerebral Angio 09/30/2019 S/P RT common carotid arteriogram followed by complete revascularization of of occluded mid M2 seg of a codominant inf division with x 1 pass with 3mm x 33mm trevuprovue retriver device and penumbra aspiration achieving a TICI 3 revascularization.     HISTORY OF PRESENT ILLNESS Belinda Anderson is an 85 y.o. female with a history of hypertension, arthritis, thyroid disease, dyslipidemia, and PICA aneurysm. She lives independently and walks with a cane. She does not drive due to mild memory issues, according to her daughter. The patient continues to manage finances with assistance from her daughter. mRS 2.  She was last known well at 2000 on 09/29/19 by family. This morning her daughter called and noticed she was slurring her speech. Her daughter went to see her and EMS was called. According to EMS the patient had worsening facial droop during transport to the hospital.  Initial NIHSS 13: unable to answer orientation questions correctly, gaze deviation, visual field neglect, left facial palsy, dysarthria, slight left arm and leg weakness, sensory extinction. Not a candidate for tPA as she was outside the window.  CT no acute stroke, no hemorrhage. Perfusion no core. CTA with severe M2 stenosis with distal M3 occlusion. SBP 150s, IV fluids running and placed in trendelenburg to improve perfusion.   Case discussed with Interventional Radiologist and it was determined she was appropriate for diagnostic angio with intent to treat. Dr. Laurence Slate discussed the procedure with the patient and her daughter and they both wanted to proceed. The patient was transferred to IR  to prepare for the procedure and the daughter spoke with the interventionalist for consent.   HOSPITAL COURSE Ms. Belinda Anderson is a 85 y.o. female with history of hypertension, arthritis, thyroid disease, dyslipidemia, PICA aneurysm and mild memory issues presenting with slurred speech, facial droop, gaze deviation, visual field neglect, L facial palsy, L arm and leg weakness, sensory extinction. Sent to IR where occluded R M2 was revascularized.   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 Small vessel disease. Atrophy. Old L basal ganglia infarct. Chronic lesions B globus pallidus.   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 Neck ok. 4mm R PICA aneurysm.  CT perfusion 13mm penumbra  Cerebral angio mid R M2 occlusion w/ TICI3 revascularization  Post IR CT head No ICH or mass effect seen.  MRI  Few small infarcts R MCA (R insular cortex, subinsular, superior R temporal lobe and cortex R postcentrul gyrus). Small vessel disease. Atrophy. Chronic B globus pallidus lesions.  2D Echo EF 65-70%. No source of  embolus   LE venous dopplers no DVT  LDL 88  HgbA1c 5.9  aspirin 325 mg daily prior to admission, treated with aspirin 325 mg daily and clopidogrel 75 mg daily in hospital. Changed to Eliquis 2.5mg  bid when AF detected.  Therapy recommendations:  HH PT, OT, SLP  Disposition:  return home  Atrial Fibrillation  New diagnoses in hospital  CHA2DS2-VASc Score = 6, ?2 oral anticoagulation recommended  Age in Years:  ?67   +2    Sex:  Female   Female   +  1    Hypertension History:  yes   +1     Diabetes Mellitus:  0  Congestive Heart Failure History:  0  Vascular Disease History:  0     Stroke/TIA/Thromboembolism History:  yes   +2 . Started on Eliquis (apixaban) 2.5mg  bid, continue at discharge   Hypertension  Home meds:  norvasc 2.5, lasix 40  Stable, but on the low end  Long-term BP goal normotensive  Hyperlipidemia  Home meds:  pravachol 40  LDL 88, goal < 70  Changed pravachol to lipitor 40  Continue statin at discharge  Other Stroke Risk Factors  Advanced age  Other Active Problems  Baseline cognitive deficits  Thyroid disease on synthroid  Arthritis  R PICA aneurysm  Acute blood loss anemia post IR 13.1-12.9-11.6   CKD stage IIIa, Cre 1.77-1.6-1.52   DISCHARGE EXAM Blood pressure (!) 142/74, pulse 75, temperature 98 F (36.7 C), temperature source Axillary, resp. rate 17, height 5\' 4"  (1.626 m), weight 67.6 kg, SpO2 97 %. General - Well nourished, well developed, in no apparent distress.  Ophthalmologic - fundi not visualized due to noncooperation.  Cardiovascular - Regular rhythm and rate.  Mental Status -  Level of arousal and orientation to year, place, and person were intact, but not orientated to month. Language including expression, naming, repetition, comprehension was assessed and found intact.  Cranial Nerves II - XII - II - Visual field intact OU. III, IV, VI - Extraocular movements intact. V - Facial sensation intact  bilaterally. VII - Facial movement intact bilaterally. VIII - Hearing & vestibular intact bilaterally. X - Palate elevates symmetrically. XI - Chin turning & shoulder shrug intact bilaterally. XII - Tongue protrusion intact.  Motor Strength - The patient's strength was normal in all extremities and pronator drift was absent.  Bulk was normal and fasciculations were absent.   Motor Tone - Muscle tone was assessed at the neck and appendages and was normal.  Reflexes - The patient's reflexes were symmetrical in all extremities and she had no pathological reflexes.  Sensory - Light touch, temperature/pinprick were assessed and were symmetrical.    Coordination - The patient had normal movements in the hands with no ataxia or dysmetria.  Tremor was absent.  Gait and Station - deferred.  Discharge Diet   Heart healthy thin liquids  DISCHARGE PLAN  Disposition:  Return home  Home health PT, OT, SLP  Eliquis (apixaban) daily for secondary stroke prevention  Ongoing stroke risk factor control by Primary Care Physician at time of discharge  Follow-up Seabron Spates R, DO in 2 weeks.  Follow-up in [facility] in 4 weeks, office to schedule an appointment.   Follow-up Sanjeev Alinda Money) Corliss Skains, MD (Interventional Neuroradiologist) in 4 weeks, office to schedule an appointment.   Follow-up Otilio Saber, [place] in atrial fibrillation clinic 11/18/2019 at 1120  35 minutes were spent preparing discharge.  Marvel Plan, MD PhD Stroke Neurology 10/02/2019 6:25 PM

## 2019-10-03 ENCOUNTER — Encounter (HOSPITAL_COMMUNITY): Payer: Self-pay

## 2019-10-03 ENCOUNTER — Telehealth: Payer: Self-pay | Admitting: *Deleted

## 2019-10-03 ENCOUNTER — Other Ambulatory Visit (HOSPITAL_COMMUNITY): Payer: Self-pay | Admitting: Interventional Radiology

## 2019-10-03 ENCOUNTER — Telehealth: Payer: Self-pay | Admitting: Family Medicine

## 2019-10-03 DIAGNOSIS — I639 Cerebral infarction, unspecified: Secondary | ICD-10-CM

## 2019-10-03 NOTE — Telephone Encounter (Signed)
Pts daughter called stating pt is having trouble with one of the medications she is taking that is causing her stomach pain, diarrhea, and nausea. Please advise.     [id number] [place], [place]  [address] [place] [address]  Phone: [phone] Fax: [phone]  Not a 24 hour pharmacy; exact hours not known.

## 2019-10-03 NOTE — Telephone Encounter (Signed)
Correction to my TCM note @1113 .Pt is able to feed herself. Pt requires assistance w/ all other ADLs. Her children are currently assisting her.

## 2019-10-03 NOTE — Telephone Encounter (Signed)
Transition Care Management Follow-up Telephone Call-- call done w/ith dtr Lelan Pons [facility])   Date discharged? 10/02/19   How have you been since you were released from the hospital? Doing fine   Do you understand why you were in the hospital? yes   Do you understand the discharge instructions? yes   Where were you discharged to? Home. One of her children are staying with her at all time.   Items Reviewed:  Medications reviewed: daughter is driving. States they will bring list to appt.on 10/21/19  Allergies reviewed: yes  Dietary changes reviewed: yes  Referrals reviewed: yes   Functional Questionnaire:   Activities of Daily Living (ADLs):   She states they are independent in the following: ambulation, bathing and hygiene, feeding, continence, grooming, toileting and dressing States they require assistance with the following: na   Any transportation issues/concerns?: no   Any patient concerns? no   Confirmed importance and date/time of follow-up visits scheduled yes  Provider Appointment booked with PCP 10/21/19.  Confirmed with patient if condition begins to worsen call PCP or go to the ER.  Patient was given the office number and encouraged to call back with question or concerns.  : yes

## 2019-10-07 ENCOUNTER — Other Ambulatory Visit: Payer: Self-pay | Admitting: *Deleted

## 2019-10-07 ENCOUNTER — Telehealth: Payer: Self-pay | Admitting: Family Medicine

## 2019-10-07 NOTE — Telephone Encounter (Signed)
Caller/Agency: [facility] Callback Number: [phone] ok to leave verbal on vm Requesting OT/PT/Skilled Nursing/Social Work/Speech Therapy: OT Frequency: 2 week 2, 1 week 2,

## 2019-10-07 NOTE — Patient Outreach (Signed)
[facility]) Care Management  10/07/2019  Indianna Boran 20-Apr-1932 [id number]  EMMI- stroke On the APL list RED ON EMMI ALERT Day # 1 Date: Friday 10/04/19 1004  Red Alert Reason: Scheduled a follow-up appointment? No   Insurance: [employer] [place] [facility] admissions x 1 ED visits x 1 in the last 6 months  Last admission at [facility] 09/30/19 to 10/02/19 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 - secondary to new diagnosed afib  Transition of care services noted to be completed by primary care MD office staff- Dr Etter Sjogren- who will refer to [facility] care management if needed.  Outreach attempt # 1 successful to the home number  Patient is not able to verify HIPAA, DOB and address as she has dementia Her daughter, Lelan Pons was able to verify HIPAA, DOB and address Lelan Pons has been the [employer] (DPR) as listed in [place] since 03/22/10 [facility] Care Management RN reviewed and addressed red alert with Emelia Loron reports Mrs Scheib was seen by [facility] staff today and is tolerating it well especially with her sisters present to help guide her   EMMI:  Lelan Pons confirms there is not a scheduled appointment with [facility] neurology nor Dr Estanislado Pandy at this time.   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 One of her children or sister stay with her at all times  Lelan Pons provides transportation to medical appointments Mrs Waymire is able to ambulate, feed, dresses, bathe herself but needs assist with all IADLs (instrumental Activities of Daily Living)  Conditions: HTN, Atrial fibrillation, goiter, memory los, diarrhea, syncope , cerebral aneurysm rupture, stroke due to embolism of RMCA, HLD, right leg swelling, arthritis, thyroid disease   DME: cane eyeglasses   Medications: denies concerns with taking medications as prescribed, affording medications, side  effects of medications and questions about medications    Appointments: 10/20/18 Dr Etter Sjogren Primary MD hospital Follow up (f/u)   [facility] Directives:   Consent: [facility] RN CM reviewed [facility] services with patient. Patient gave verbal consent for services [facility] telephonic RN CM.   Advised patient that there will be further automated EMMI- post discharge calls to assess how the patient is doing following the recent hospitalization Advised the patient that another call may be received from a nurse if any of their responses were abnormal. Patient voiced understanding and was appreciative of f/u call.   Plan: [facility] RN CM will follow up with the patient to assist with follow up appointments within the next 7 business days  Pt encouraged to return a call to [place] CM prn  [facility] RN CM sent a successful outreach letter as discussed with [facility] brochure enclosed for review   [place] L. Lavina Hamman, RN, BSN, [place] Coordinator Office number [phone] Mobile number [phone]  Main [facility] number [phone] Fax number [phone]

## 2019-10-07 NOTE — Telephone Encounter (Signed)
Most likely eliquis causing the bleeding-- its a blood thinner ---  if she is having bloody diarrhea she needs to go to ER

## 2019-10-07 NOTE — Telephone Encounter (Signed)
Orders confirmed.

## 2019-10-07 NOTE — Telephone Encounter (Signed)
Pt's daughter states stomach pain has resolved. Daughter states mom has blood in her stool and always has diarrhea. Daughter concerns about Lasix or Lipitor causing the bleeding. Daughter states she has a follow up on 10/21/2019. Please advise.

## 2019-10-08 ENCOUNTER — Other Ambulatory Visit: Payer: Self-pay | Admitting: Family Medicine

## 2019-10-08 ENCOUNTER — Other Ambulatory Visit: Payer: Self-pay | Admitting: *Deleted

## 2019-10-08 ENCOUNTER — Ambulatory Visit: Payer: Medicare Other | Admitting: Family Medicine

## 2019-10-08 ENCOUNTER — Other Ambulatory Visit: Payer: Self-pay

## 2019-10-08 VITALS — BP 120/78 | HR 98 | Temp 97.3°F | Resp 18 | Wt 148.4 lb

## 2019-10-08 DIAGNOSIS — I10 Essential (primary) hypertension: Secondary | ICD-10-CM | POA: Diagnosis not present

## 2019-10-08 DIAGNOSIS — I4891 Unspecified atrial fibrillation: Secondary | ICD-10-CM

## 2019-10-08 DIAGNOSIS — E039 Hypothyroidism, unspecified: Secondary | ICD-10-CM | POA: Diagnosis not present

## 2019-10-08 DIAGNOSIS — E1169 Type 2 diabetes mellitus with other specified complication: Secondary | ICD-10-CM

## 2019-10-08 DIAGNOSIS — E785 Hyperlipidemia, unspecified: Secondary | ICD-10-CM | POA: Diagnosis not present

## 2019-10-08 DIAGNOSIS — K644 Residual hemorrhoidal skin tags: Secondary | ICD-10-CM

## 2019-10-08 DIAGNOSIS — R197 Diarrhea, unspecified: Secondary | ICD-10-CM

## 2019-10-08 DIAGNOSIS — R413 Other amnesia: Secondary | ICD-10-CM

## 2019-10-08 MED ORDER — HYDROCORTISONE (PERIANAL) 2.5 % EX CREA: 1.0000 "application " | TOPICAL_CREAM | Freq: Two times a day (BID) | CUTANEOUS | 0 refills | Status: DC

## 2019-10-08 MED ORDER — HYDROCORTISONE ACETATE 25 MG RE SUPP: 25.0000 mg | Freq: Two times a day (BID) | RECTAL | 0 refills | Status: DC

## 2019-10-08 NOTE — Telephone Encounter (Signed)
Appointment made for today

## 2019-10-08 NOTE — Patient Outreach (Signed)
  [facility]) Care Management  10/08/2019  Belinda Anderson 06-25-1932 [id number]   Care coordination - f/u on hospital follow up appointments   [facility] RN CM spoke with Belinda Anderson in referral department of [facility] about pt hospital follow up appointment  Belinda Anderson reports since pt was seen by Dr Erlinda Hong at the hospital she will be followed up by Maricela Bo, NP and the pts daughter, Belinda Anderson will be called to schedule the appointment. Pt with memory issues  Verified the correct contact number to reach the daughter, Belinda Reining RN CM called and left a message at [phone] for Dr Estanislado Pandy staff about the pt appointment [facility] RN CM contact information for a return call   Outreach attempt to daughter,Belinda Anderson No answer. [facility] RN CM left HIPAA compliant voicemail message along with CM's contact info.    Plan  [facility] RN CM will follow up with the patient to assist with follow up appointments within the next 7 business days  Pt encouraged to return a call to [place] CM prn   Belinda Anderson L. Lavina Hamman, RN, BSN, [place] Coordinator Office number [phone] Mobile number [phone]  Main [facility] number [phone] Fax number [phone]

## 2019-10-08 NOTE — Telephone Encounter (Signed)
She needs appointment preferably in person and this week so labs can be done They should also call the cardiologist ---they rx the eliquis for a fib

## 2019-10-08 NOTE — Patient Instructions (Signed)

## 2019-10-08 NOTE — Telephone Encounter (Signed)
Spoke with daughter. She states patient was having diarrhea before the stroke but the blood is new. She states she noticed the blood after discharge from the hospital. She states patient is up and moving around and having no complaints and is eating well and her coloring is better today. She states her mom is having urinary frequency which may be due to the lasix.

## 2019-10-09 LAB — COMPREHENSIVE METABOLIC PANEL
ALT: 14 U/L (ref 0–35)
AST: 17 U/L (ref 0–37)
Albumin: 4.2 g/dL (ref 3.5–5.2)
Alkaline Phosphatase: 114 U/L (ref 39–117)
BUN: 23 mg/dL (ref 6–23)
CO2: 24 mEq/L (ref 19–32)
Calcium: 9.1 mg/dL (ref 8.4–10.5)
Chloride: 106 mEq/L (ref 96–112)
Creatinine, Ser: 1.8 mg/dL — ABNORMAL HIGH (ref 0.40–1.20)
GFR: 26.72 mL/min — ABNORMAL LOW (ref >60.00)
Glucose, Bld: 108 mg/dL — ABNORMAL HIGH (ref 70–99)
Potassium: 3.8 mEq/L (ref 3.5–5.1)
Sodium: 141 mEq/L (ref 135–145)
Total Bilirubin: 0.5 mg/dL (ref 0.2–1.2)
Total Protein: 6.7 g/dL (ref 6.0–8.3)

## 2019-10-09 LAB — CBC WITH DIFFERENTIAL/PLATELET
Basophils Absolute: 0.1 10*3/uL (ref 0.0–0.1)
Basophils Relative: 1.1 % (ref 0.0–3.0)
Eosinophils Absolute: 0.1 10*3/uL (ref 0.0–0.7)
Eosinophils Relative: 1.9 % (ref 0.0–5.0)
HCT: 39.7 % (ref 36.0–46.0)
Hemoglobin: 13 g/dL (ref 12.0–15.0)
Lymphocytes Relative: 27.6 % (ref 12.0–46.0)
Lymphs Abs: 1.9 10*3/uL (ref 0.7–4.0)
MCHC: 32.9 g/dL (ref 30.0–36.0)
MCV: 95.3 fl (ref 78.0–100.0)
Monocytes Absolute: 0.9 10*3/uL (ref 0.1–1.0)
Monocytes Relative: 12.8 % — ABNORMAL HIGH (ref 3.0–12.0)
Neutro Abs: 3.9 10*3/uL (ref 1.4–7.7)
Neutrophils Relative %: 56.6 % (ref 43.0–77.0)
Platelets: 307 10*3/uL (ref 150.0–400.0)
RBC: 4.16 Mil/uL (ref 3.87–5.11)
RDW: 14 % (ref 11.5–15.5)
WBC: 6.9 10*3/uL (ref 4.0–10.5)

## 2019-10-09 LAB — LIPID PANEL
Cholesterol: 118 mg/dL (ref 0–200)
HDL: 39.1 mg/dL (ref >39.00)
LDL Cholesterol: 48 mg/dL (ref 0–99)
NonHDL: 79.23
Total CHOL/HDL Ratio: 3
Triglycerides: 155 mg/dL — ABNORMAL HIGH (ref 0.0–149.0)
VLDL: 31 mg/dL (ref 0.0–40.0)

## 2019-10-09 LAB — TSH: TSH: 0.62 u[IU]/mL (ref 0.35–4.50)

## 2019-10-12 ENCOUNTER — Encounter: Payer: Self-pay | Admitting: Family Medicine

## 2019-10-12 ENCOUNTER — Other Ambulatory Visit: Payer: Self-pay | Admitting: Family Medicine

## 2019-10-12 DIAGNOSIS — K644 Residual hemorrhoidal skin tags: Secondary | ICD-10-CM | POA: Insufficient documentation

## 2019-10-12 DIAGNOSIS — I10 Essential (primary) hypertension: Secondary | ICD-10-CM

## 2019-10-12 DIAGNOSIS — E785 Hyperlipidemia, unspecified: Secondary | ICD-10-CM

## 2019-10-12 DIAGNOSIS — E1169 Type 2 diabetes mellitus with other specified complication: Secondary | ICD-10-CM | POA: Insufficient documentation

## 2019-10-12 DIAGNOSIS — E039 Hypothyroidism, unspecified: Secondary | ICD-10-CM

## 2019-10-12 NOTE — Assessment & Plan Note (Signed)
F/u neuro  

## 2019-10-12 NOTE — Assessment & Plan Note (Signed)
Well controlled, no changes to meds. Encouraged heart healthy diet such as the DASH diet and exercise as tolerated.  °

## 2019-10-12 NOTE — Progress Notes (Signed)
Patient ID: Belinda Anderson, female    DOB: Feb 23, 1932  Age: 85 y.o. MRN: [id number]    Subjective:  Subjective  HPI [name] presents for f/u hosp for stroke / a fib.   She was admitted 12/14- 12/16   She had a stroke due to embolism of the right middle cerebral artery   S/p IR R MCA m2-secondary to new a fib  She was d/c home with pt, ot  And started or eliquis and has f/u with neuro, and IR and cardiology Family states pt has had blood in her stool and diarrhea.  Pt denies this but daughter has seen at and her son has too Review of Systems  Constitutional: Negative for appetite change, diaphoresis, fatigue and unexpected weight change.  Eyes: Negative for pain, redness and visual disturbance.  Respiratory: Negative for cough, chest tightness, shortness of breath and wheezing.   Cardiovascular: Negative for chest pain, palpitations and leg swelling.  Endocrine: Negative for cold intolerance, heat intolerance, polydipsia, polyphagia and polyuria.  Genitourinary: Negative for difficulty urinating, dysuria and frequency.  Neurological: Negative for dizziness, light-headedness, numbness and headaches.  Psychiatric/Behavioral: Positive for confusion.    History Past Medical History:  Diagnosis Date  . Arthritis   . Hypertension   . Thyroid disease     She has a past surgical history that includes Eye surgery; Radiology with anesthesia (N/A, 09/30/2019); IR PERCUTANEOUS ART THROMBECTOMY/INFUSION INTRACRANIAL INC DIAG ANGIO (09/30/2019); IR CT Head Ltd (09/30/2019); and IR ANGIO VERTEBRAL SEL SUBCLAVIAN INNOMINATE UNI R MOD SED (09/30/2019).   Her family history includes Arthritis in her mother; Coronary artery disease in an other family member; Diabetes in her brother; Heart disease in her mother; Heart disease (age of onset: 57) in her father.She reports that she has never smoked. She has never used smokeless tobacco. She reports that she does not drink alcohol or use  drugs.  Current Outpatient Medications on File Prior to Visit  Medication Sig Dispense Refill  . acetaminophen (TYLENOL) 650 MG CR tablet Take 1,300 mg by mouth 3 (three) times daily.    Marland Kitchen amLODipine (NORVASC) 2.5 MG tablet TAKE 1 TABLET BY MOUTH ONCE DAILY (Patient taking differently: Take 2.5 mg by mouth daily. ) 90 tablet 1  . apixaban (ELIQUIS) 2.5 MG TABS tablet Take 1 tablet (2.5 mg total) by mouth 2 (two) times daily. 60 tablet 2  . atorvastatin (LIPITOR) 40 MG tablet Take 1 tablet (40 mg total) by mouth daily at 6 PM. 30 tablet 2  . diclofenac sodium (VOLTAREN) 1 % GEL Apply 4 g topically 4 (four) times daily. (Patient taking differently: Apply 4 g topically 4 (four) times daily as needed (joint pain). ) 100 g 2  . furosemide (LASIX) 40 MG tablet Take 1 tablet (40 mg total) by mouth daily. 90 tablet 3  . levothyroxine (SYNTHROID) 75 MCG tablet TAKE 1 TABLET(75 MCG) BY MOUTH DAILY (Patient taking differently: Take 75 mcg by mouth daily. ) 90 tablet 3  . potassium chloride SA (K-DUR) 20 MEQ tablet Take 1 tablet (20 mEq total) by mouth daily. 90 tablet 1   No current facility-administered medications on file prior to visit.     Objective:  Objective  Physical Exam Vitals and nursing note reviewed.  Constitutional:      Appearance: She is well-developed.  HENT:     Head: Normocephalic and atraumatic.  Eyes:     Conjunctiva/sclera: Conjunctivae normal.  Neck:     Thyroid: No thyromegaly.  Vascular: No carotid bruit or JVD.  Cardiovascular:     Rate and Rhythm: Normal rate and regular rhythm.     Heart sounds: Normal heart sounds. No murmur.  Pulmonary:     Effort: Pulmonary effort is normal. No respiratory distress.     Breath sounds: Normal breath sounds. No wheezing or rales.  Chest:     Chest wall: No tenderness.  Genitourinary:    Rectum: Guaiac result negative. External hemorrhoid present. No mass.  Musculoskeletal:     Cervical back: Normal range of motion and neck  supple.  Neurological:     Mental Status: She is alert and oriented to person, place, and time.    BP 120/78 (BP Location: Left Arm, Patient Position: Sitting, Cuff Size: Normal)   Pulse 98   Temp (!) 97.3 F (36.3 C) (Temporal)   Resp 18   Wt 148 lb 6.4 oz (67.3 kg)   SpO2 98%   BMI 25.47 kg/m  Wt Readings from Last 3 Encounters:  10/08/19 148 lb 6.4 oz (67.3 kg)  09/30/19 149 lb (67.6 kg)  05/10/19 151 lb 3.2 oz (68.6 kg)     Lab Results  Component Value Date   WBC 6.9 10/08/2019   HGB 13.0 10/08/2019   HCT 39.7 10/08/2019   PLT 307.0 10/08/2019   GLUCOSE 108 (H) 10/08/2019   CHOL 118 10/08/2019   TRIG 155.0 (H) 10/08/2019   HDL 39.10 10/08/2019   LDLDIRECT 97.0 04/06/2018   LDLCALC 48 10/08/2019   ALT 14 10/08/2019   AST 17 10/08/2019   NA 141 10/08/2019   K 3.8 10/08/2019   CL 106 10/08/2019   CREATININE 1.80 (H) 10/08/2019   BUN 23 10/08/2019   CO2 24 10/08/2019   TSH 0.62 10/08/2019   INR 1.0 09/30/2019   HGBA1C 5.9 (H) 10/01/2019    CT Angio Head W or Wo Contrast  Result Date: 09/30/2019 CLINICAL DATA:  Facial droop and left-sided weakness EXAM: CT ANGIOGRAPHY HEAD AND NECK CT PERFUSION BRAIN TECHNIQUE: Multidetector CT imaging of the head and neck was performed using the standard protocol during bolus administration of intravenous contrast. Multiplanar CT image reconstructions and MIPs were obtained to evaluate the vascular anatomy. Carotid stenosis measurements (when applicable) are obtained utilizing NASCET criteria, using the distal internal carotid diameter as the denominator. Multiphase CT imaging of the brain was performed following IV bolus contrast injection. Subsequent parametric perfusion maps were calculated using RAPID software. CONTRAST:  OMNIPAQUE IOHEXOL 350 MG/ML SOLN COMPARISON:  Head CT earlier today.  Brain MRI 06/27/2013 FINDINGS: CTA NECK FINDINGS Aortic arch: 2 vessel branching. Right carotid system: Atheromatous wall thickening of  the common carotid to the level of the bifurcation. No flow limiting stenosis or ulceration. Negative for beading. Left carotid system: Calcified plaque at the bifurcation that is mild. No stenosis or ulceration. Vertebral arteries: Mild subclavian atherosclerosis on the right, more moderate on the left, without flow limiting stenosis or ulceration. Mild-to-moderate atheromatous narrowing at the left vertebral origin due to calcified plaque. Skeleton: Diffuse degenerative disease of the cervical spine. Other neck: No acute or aggressive finding. Postoperative right thyroid. Upper chest: Biapical pleural based scarring Review of the MIP images confirms the above findings CTA HEAD FINDINGS Anterior circulation: Mild atherosclerotic plaque along the carotid siphons. There is a severe right M2 branch occlusion at the level of the insula with the downstream distal right M3 branch occlusion. Atherosclerotic irregularity of medium size vessels. Negative for aneurysm. Bilateral early branching MCA. Posterior  circulation: Fenestrated left V4 segment and proximal basilar. 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 Symmetric flow in the posterior cerebral arteries. Venous sinuses: Negative Anatomic variants: As above Review of the MIP images confirms the above findings CT Brain Perfusion Findings: ASPECTS: 10 CBF (<30%) Volume: 0mL Perfusion (Tmax>6.0s) volume: 13mL These results were called by telephone at the time of interpretation on 09/30/2019 at 9:01 am to provider Arther Dames , who verbally acknowledged these results. IMPRESSION: 1. 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 This is associated with 13 cc of penumbra and even greater area of relative ischemia when using the strictest T-max parameters. 2. No flow limiting stenosis or embolic source seen in the more proximal neck. 3. 4 mm right PICA aneurysm. The left V4 segment and  basilar are fenestrated. Electronically Signed   By: Marnee Spring M.D.   On: 09/30/2019 09:10   CT ANGIO NECK W OR WO CONTRAST  Result Date: 09/30/2019 CLINICAL DATA:  Facial droop and left-sided weakness EXAM: CT ANGIOGRAPHY HEAD AND NECK CT PERFUSION BRAIN TECHNIQUE: Multidetector CT imaging of the head and neck was performed using the standard protocol during bolus administration of intravenous contrast. Multiplanar CT image reconstructions and MIPs were obtained to evaluate the vascular anatomy. Carotid stenosis measurements (when applicable) are obtained utilizing NASCET criteria, using the distal internal carotid diameter as the denominator. Multiphase CT imaging of the brain was performed following IV bolus contrast injection. Subsequent parametric perfusion maps were calculated using RAPID software. CONTRAST:  OMNIPAQUE IOHEXOL 350 MG/ML SOLN COMPARISON:  Head CT earlier today.  Brain MRI 06/27/2013 FINDINGS: CTA NECK FINDINGS Aortic arch: 2 vessel branching. Right carotid system: Atheromatous wall thickening of the common carotid to the level of the bifurcation. No flow limiting stenosis or ulceration. Negative for beading. Left carotid system: Calcified plaque at the bifurcation that is mild. No stenosis or ulceration. Vertebral arteries: Mild subclavian atherosclerosis on the right, more moderate on the left, without flow limiting stenosis or ulceration. Mild-to-moderate atheromatous narrowing at the left vertebral origin due to calcified plaque. Skeleton: Diffuse degenerative disease of the cervical spine. Other neck: No acute or aggressive finding. Postoperative right thyroid. Upper chest: Biapical pleural based scarring Review of the MIP images confirms the above findings CTA HEAD FINDINGS Anterior circulation: Mild atherosclerotic plaque along the carotid siphons. There is a severe right M2 branch occlusion at the level of the insula with the downstream distal right M3 branch occlusion.  Atherosclerotic irregularity of medium size vessels. Negative for aneurysm. Bilateral early branching MCA. Posterior circulation: Fenestrated left V4 segment and proximal basilar. 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 Symmetric flow in the posterior cerebral arteries. Venous sinuses: Negative Anatomic variants: As above Review of the MIP images confirms the above findings CT Brain Perfusion Findings: ASPECTS: 10 CBF (<30%) Volume: 0mL Perfusion (Tmax>6.0s) volume: 13mL These results were called by telephone at the time of interpretation on 09/30/2019 at 9:01 am to provider Arther Dames , who verbally acknowledged these results. IMPRESSION: 1. 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 This is associated with 13 cc of penumbra and even greater area of relative ischemia when using the strictest T-max parameters. 2. No flow limiting stenosis or embolic source seen in the more proximal neck. 3. 4 mm right PICA aneurysm. The left V4 segment and basilar are fenestrated.  Electronically Signed   By: Monte Fantasia M.D.   On: 09/30/2019 09:10   MR BRAIN WO CONTRAST  Result Date: 09/30/2019 CLINICAL DATA:  Stroke, follow-up. Additional history provided: New onset speech difficulty and left-sided weakness, underwent revascularization EXAM: MRI HEAD WITHOUT CONTRAST TECHNIQUE: Multiplanar, multiecho pulse sequences of the brain and surrounding structures were obtained without intravenous contrast. COMPARISON:  Non-contrast CT head and CT angiogram head/neck performed earlier the same day 09/30/2019, brain MRI 06/27/2013 FINDINGS: Brain: Multiple sequences are motion degraded. Most notably, there is moderate motion degradation of the axial T2 weighted sequence and axial T2 FLAIR sequence, as well as severe motion degradation of the axial T1 weighted sequence. There are several small acute infarcts within the right MCA vascular  territory as follows. 10 mm acute infarct within the right insular cortex (series 3, image 24). A subcentimeter acute infarct is also present within the adjacent right subinsular region (series 3, images 25-26). Subcentimeter acute cortical infarct within the superior right temporal lobe (series 3, image 22). Small acute infarct within the lateral right postcentral gyrus (series 30, image 38). No evidence of intracranial mass. No midline shift or extra-axial fluid collection. No chronic intracranial blood products. Moderate chronic small vessel ischemic disease. Redemonstrated T2 hyperintense lesions within the globus pallidus bilaterally consistent with sequela of remote insult. Mild generalized parenchymal atrophy Vascular: Flow voids maintained within the proximal large arterial vessels. Please refer to CTA head/neck performed earlier the same day for description of a 4 mm right PICA aneurysm. Skull and upper cervical spine: No focal marrow lesion. Incompletely assessed upper cervical spondylosis. Sinuses/Orbits: Bilateral lens replacements. No significant paranasal sinus disease or mastoid effusion at the imaged levels. IMPRESSION: Significantly motion degraded examination. There are a few small acute infarcts within the right MCA territory measuring up to 10 mm. These infarcts are located within the right insular cortex, right subinsular region, superior right temporal lobe cortex and cortex of the right postcentral gyrus. Stable generalized parenchymal atrophy and chronic small vessel ischemic disease. Redemonstrated symmetric lesions within the globus pallidus bilaterally. Findings are consistent with sequela of remote insult, possibly carbon monoxide poisoning or other anoxic brain injury. Electronically Signed   By: Kellie Simmering DO   On: 09/30/2019 16:45   IR [place]  Result Date: 10/02/2019 INDICATION: Acute onset of right gaze deviation, dysarthria and left-sided neglect. Occluded mid M2 segment  of the inferior division of the right middle cerebral artery on CT angiogram of the head and neck. EXAM: 1. EMERGENT LARGE VESSEL OCCLUSION THROMBOLYSIS (anterior CIRCULATION) COMPARISON:  CT angiogram of the head and neck of September 30, 2019. MEDICATIONS: Ancef 2 g IV antibiotic was administered within 1 hour of the procedure. ANESTHESIA/SEDATION: General anesthesia. CONTRAST:  Isovue 300 approximately 50 cc. FLUOROSCOPY TIME:  Fluoroscopy Time: 23 minutes 6 seconds (1055 mGy). COMPLICATIONS: None immediate. TECHNIQUE: Following a full explanation of the procedure along with the potential associated complications, an informed witnessed consent was obtained from patient's daughter. The risks of intracranial hemorrhage of 10%, worsening neurological deficit, ventilator dependency, death and inability to revascularize were all reviewed in detail with the patient's daughter. The patient was then put under general anesthesia by the Department of Anesthesiology at [facility]. The right groin was prepped and draped in the usual sterile fashion. Thereafter using modified Seldinger technique, transfemoral access into the right common femoral artery was obtained without difficulty. Over a 0.035 inch guidewire a 5 French Pinnacle sheath was inserted. Through this, and also  over a 0.035 inch guidewire a 5 [place] JB 1 catheter was advanced to the aortic arch region and selectively positioned in the innominate artery and the right common carotid artery. FINDINGS: The innominate artery angiogram demonstrates the origins of the right subclavian artery and the right common carotid artery to be widely patent. The right vertebral artery origin is grossly patent. The vessel is seen to opacify to the cranial skull base to opacify the basilar artery on the lateral projection. Faint opacification of the anterior-inferior cerebellar arteries is seen. The right common carotid arteriogram demonstrates approximately 40-50%  narrowing of the origin of the right external carotid artery. The right internal carotid artery at the bulb to the cranial skull base demonstrates wide patency. The petrous, cavernous, and the supraclinoid segments are widely patent. A small infundibulum is seen at the origin of the right posterior communicating artery. The right middle cerebral artery demonstrates early bifurcation a developmental variation. Occlusion of the M2 segment of the inferior division of the right middle cerebral artery is noted with a prominent area of hypoperfusion involving the right posterior frontal, and the subcortical frontal parietal regions. PROCEDURE: The diagnostic JB 1 catheter in the right common carotid artery was then exchanged over a 0.035 inch 300 cm Rosen exchange guidewire for an 8 [place] Pinnacle sheath in the right groin. This was then connected to continuous heparinized saline infusion. Over the Walt Disneyosen exchange guidewire, an 087 Walrus balloon guide catheter which had been prepped with 50% contrast and 50% heparinized saline infusion was advanced and positioned at the orifice of the right internal carotid artery. The guidewire was removed. Good aspiration was obtained from the hub of the Walrus balloon guide catheter. Using biplane roadmap technique, a 132 cm 6 [place] Catalyst guide catheter was then advanced over a 0.035 inch Roadrunner guidewire and positioned in the distal petrous segment of the right internal carotid artery. The guidewire was removed. Good aspiration was obtained from the hub of the Catalyst guide catheter. Control arteriogram was then performed centered over the right MCA distribution. Over a 0.014 inch standard Synchro micro guidewire which had a J configuration, an 021 150 cm Trevo ProVue microcatheter was advanced without difficulty into the supraclinoid right ICA. The micro guidewire was then manipulated into the inferior division and passed the M2 segment occlusion into the distal M2 M3  region. The microcatheter was then advanced over the micro guidewire. The guidewire was removed. Good aspiration obtained from the hub of the microcatheter. A gentle control arteriogram performed through the microcatheter demonstrated safe position of tip of the microcatheter. This was then connected to continuous heparinized saline infusion. A 3 mm x 33 mm Trevo ProVue retrieval device was then advanced to the distal end of the microcatheter. The O ring on the delivery microcatheter was loosened. With slight forward gentle traction with the right hand on the delivery micro guidewire, with the left hand the distal and then the proximal portion of the retrieval device was then deployed. The 6 [place] Catalyst guide catheter was advanced to the origin of the inferior division of the right middle cerebral artery. With proximal flow arrest in the right internal carotid artery by inflating the balloon of the [facility] guide catheter and aspiration with a 60 mL syringe at the hub of the [facility] guide catheter, and with a Penumbra aspiration device at the hub of the 6 [place] Catalyst guide catheter over about 2 1/2 minutes, the combination was gently retrieved and removed. With constant Penumbra aspiration on  the 6 [place] Catalyst guide catheter, the retrieval device and the microcatheter were retrieved and removed. A piece of clot was seen in the container of the aspiration device. The proximal flow arrest was reversed. Free aspiration of blood was noted at the hub of the Catalyst guide catheter in the right internal carotid artery. A control arteriogram performed through the 6 [place] Catalyst guide catheter demonstrated complete opacification of the right middle cerebral artery superior and inferior divisions achieving a TICI 3 revascularization. The anterior cerebral artery distribution remained also widely patent. The Catalyst guide catheter was then retrieved and removed. A control arteriogram performed through the [facility]  balloon guide catheter in the right common carotid artery demonstrated excellent flow through the extracranial and intracranial right ICA, the right middle cerebral artery and the right anterior cerebral artery. Distribution continued to demonstrate free flow without evidence of intraluminal filling defects or of occlusions. No evidence of extravasation or mass-effect was seen. The Walrus balloon guide catheter was removed. The 8 French Pinnacle sheath in the right groin was then removed with successful hemostasis achieving an 8 [place] Angio-Seal closure device. The right groin appeared soft without evidence of hematoma or bleeding. Distal pulses remained Dopplerable in the dorsalis pedis, and the posterior tibial regions bilaterally unchanged. A flat panel CT of the brain demonstrated no evidence of hemorrhage, mass effect or midline shift. The patient was then extubated without difficulty. Upon recovery, the patient was able to move all four extremities, and obey simple commands. She was transferred to the neuro ICU for post thrombectomy management. IMPRESSION: Status post endovascular complete revascularization of the M2 segment of the inferior division of the right middle cerebral artery with 1 pass with the 3 mm x 33 mm Trevo ProVue retrieval device, and Penumbra aspiration achieving a TICI 3 revascularization. PLAN: Follow-up as per referring MD. Electronically Signed   By: Julieanne Cotton M.D.   On: 10/01/2019 14:04   CT CEREBRAL PERFUSION W CONTRAST  Result Date: 09/30/2019 CLINICAL DATA:  Facial droop and left-sided weakness EXAM: CT ANGIOGRAPHY HEAD AND NECK CT PERFUSION BRAIN TECHNIQUE: Multidetector CT imaging of the head and neck was performed using the standard protocol during bolus administration of intravenous contrast. Multiplanar CT image reconstructions and MIPs were obtained to evaluate the vascular anatomy. Carotid stenosis measurements (when applicable) are obtained utilizing NASCET  criteria, using the distal internal carotid diameter as the denominator. Multiphase CT imaging of the brain was performed following IV bolus contrast injection. Subsequent parametric perfusion maps were calculated using RAPID software. CONTRAST:  OMNIPAQUE IOHEXOL 350 MG/ML SOLN COMPARISON:  Head CT earlier today.  Brain MRI 06/27/2013 FINDINGS: CTA NECK FINDINGS Aortic arch: 2 vessel branching. Right carotid system: Atheromatous wall thickening of the common carotid to the level of the bifurcation. No flow limiting stenosis or ulceration. Negative for beading. Left carotid system: Calcified plaque at the bifurcation that is mild. No stenosis or ulceration. Vertebral arteries: Mild subclavian atherosclerosis on the right, more moderate on the left, without flow limiting stenosis or ulceration. Mild-to-moderate atheromatous narrowing at the left vertebral origin due to calcified plaque. Skeleton: Diffuse degenerative disease of the cervical spine. Other neck: No acute or aggressive finding. Postoperative right thyroid. Upper chest: Biapical pleural based scarring Review of the MIP images confirms the above findings CTA HEAD FINDINGS Anterior circulation: Mild atherosclerotic plaque along the carotid siphons. There is a severe right M2 branch occlusion at the level of the insula with the downstream distal right M3 branch occlusion. Atherosclerotic  irregularity of medium size vessels. Negative for aneurysm. Bilateral early branching MCA. Posterior circulation: Fenestrated left V4 segment and proximal basilar. 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 Symmetric flow in the posterior cerebral arteries. Venous sinuses: Negative Anatomic variants: As above Review of the MIP images confirms the above findings CT Brain Perfusion Findings: ASPECTS: 10 CBF (<30%) Volume: 0mL Perfusion (Tmax>6.0s) volume: 13mL These results were called by telephone at the  time of interpretation on 09/30/2019 at 9:01 am to provider Arther Dames , who verbally acknowledged these results. IMPRESSION: 1. 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 This is associated with 13 cc of penumbra and even greater area of relative ischemia when using the strictest T-max parameters. 2. No flow limiting stenosis or embolic source seen in the more proximal neck. 3. 4 mm right PICA aneurysm. The left V4 segment and basilar are fenestrated. Electronically Signed   By: Marnee Spring M.D.   On: 09/30/2019 09:10   DG Chest Portable 1 View  Result Date: 09/30/2019 CLINICAL DATA:  Stroke, altered mental status, weakness, hypertension. EXAM: PORTABLE CHEST 1 VIEW COMPARISON:  CT angiogram chest/abdomen/pelvis 02/22/2018 FINDINGS: Heart size within normal limits.  Aortic atherosclerosis. There is no airspace consolidation within the lungs. No evidence of pleural effusion or pneumothorax. No acute bony abnormality. Degenerative change of the thoracic spine. Surgical clips project over the lower neck. Overlying cardiac monitoring leads. IMPRESSION: No airspace consolidation. Aortic atherosclerosis. Electronically Signed   By: Jackey Loge DO   On: 09/30/2019 13:15   ECHOCARDIOGRAM COMPLETE  Result Date: 09/30/2019   ECHOCARDIOGRAM REPORT   Patient Name:   CHESTINE BELKNAP Date of Exam: 09/30/2019 Medical Rec #:  [id number]       Height:       64.0 in Accession #:    [id number]      Weight:       149.0 lb Date of Birth:  03/31/1932       BSA:          1.73 m Patient Age:    87 years        BP:           155/94 mmHg Patient Gender: F               HR:           77 bpm. Exam Location:  Inpatient Procedure: 2D Echo, Cardiac Doppler and Color Doppler Indications:    Stroke.  History:        Patient has prior history of Echocardiogram examinations, most                 recent 10/31/2005. Stroke, Signs/Symptoms:Syncope and                 Dizziness/Lightheadedness; Risk  Factors:Dyslipidemia and                 Hypertension.  Sonographer:    Sheralyn Boatman RDCS Referring Phys: [id number] Lafe Garin IMPRESSIONS  1. Left ventricular ejection fraction, by visual estimation, is 65 to 70%. The left ventricle has normal function. There is moderately increased left ventricular hypertrophy.  2. Small left ventricular internal cavity size.  3. The left ventricle has no regional wall motion abnormalities.  4. Global right ventricle has normal systolic function.The right ventricular size is normal. No increase in right ventricular wall thickness.  5. Left atrial size was moderately dilated.  6. Right atrial size was normal.  7. The mitral valve is normal in structure. Mild mitral valve regurgitation.  8. The tricuspid valve is normal in structure. Tricuspid valve regurgitation is mild.  9. The aortic valve is tricuspid. Aortic valve regurgitation is not visualized. Mild to moderate aortic valve sclerosis/calcification without any evidence of aortic stenosis. 10. Moderate calcifications of AV leaflets. No stenosis. 11. The pulmonic valve was not well visualized. Pulmonic valve regurgitation is not visualized. 12. Mildly elevated pulmonary artery systolic pressure. FINDINGS  Left Ventricle: Left ventricular ejection fraction, by visual estimation, is 65 to 70%. The left ventricle has normal function. The left ventricle has no regional wall motion abnormalities. The left ventricular internal cavity size was the LV cavity size is small. There is moderately increased left ventricular hypertrophy. Right Ventricle: The right ventricular size is normal. No increase in right ventricular wall thickness. Global RV systolic function is has normal systolic function. The tricuspid regurgitant velocity is 2.58 m/s, and with an assumed right atrial pressure  of 8 mmHg, the estimated right ventricular systolic pressure is mildly elevated at 34.5 mmHg. Left Atrium: Left atrial size was moderately dilated. Right  Atrium: Right atrial size was normal in size Pericardium: There is no evidence of pericardial effusion. Mitral Valve: The mitral valve is normal in structure. Mild mitral valve regurgitation. Tricuspid Valve: The tricuspid valve is normal in structure. Tricuspid valve regurgitation is mild. Aortic Valve: The aortic valve is tricuspid. Aortic valve regurgitation is not visualized. Mild to moderate aortic valve sclerosis/calcification is present, without any evidence of aortic stenosis. Moderate calcifications of AV leaflets. No stenosis. Pulmonic Valve: The pulmonic valve was not well visualized. Pulmonic valve regurgitation is not visualized. Pulmonic regurgitation is not visualized. Aorta: The aortic root is normal in size and structure. IAS/Shunts: No atrial level shunt detected by color flow Doppler.  LEFT VENTRICLE PLAX 2D LVIDd:         3.61 cm       Diastology LVIDs:         2.27 cm       LV e' lateral:   6.96 cm/s LV PW:         1.21 cm       LV E/e' lateral: 15.1 LV IVS:        1.67 cm       LV e' medial:    5.98 cm/s LVOT diam:     1.90 cm       LV E/e' medial:  17.6 LV SV:         37 ml LV SV Index:   21.17 LVOT Area:     2.84 cm  LV Volumes (MOD) LV area d, A2C:    23.30 cm LV area d, A4C:    18.20 cm LV area s, A2C:    10.30 cm LV area s, A4C:    7.63 cm LV major d, A2C:   7.16 cm LV major d, A4C:   6.07 cm LV major s, A2C:   5.37 cm LV major s, A4C:   5.27 cm LV vol d, MOD A2C: 63.3 ml LV vol d, MOD A4C: 44.1 ml LV vol s, MOD A2C: 16.9 ml LV vol s, MOD A4C: 9.3 ml LV SV MOD A2C:     46.4 ml LV SV MOD A4C:     44.1 ml LV SV MOD BP:      44.3 ml RIGHT VENTRICLE  IVC RV S prime:     15.40 cm/s  IVC diam: 2.25 cm TAPSE (M-mode): 2.3 cm LEFT ATRIUM             Index       RIGHT ATRIUM           Index LA diam:        3.90 cm 2.26 cm/m  RA Area:     10.50 cm LA Vol (A2C):   81.7 ml 47.33 ml/m RA Volume:   19.90 ml  11.53 ml/m LA Vol (A4C):   62.3 ml 36.09 ml/m LA Biplane Vol: 73.8 ml 42.75  ml/m  AORTIC VALVE LVOT Vmax:   121.00 cm/s LVOT Vmean:  80.500 cm/s LVOT VTI:    0.262 m  AORTA Ao Root diam: 3.30 cm Ao Asc diam:  3.40 cm MITRAL VALVE                         TRICUSPID VALVE MV Area (PHT): 2.76 cm              TR Peak grad:   26.5 mmHg MV PHT:        79.75 msec            TR Vmax:        287.00 cm/s MV Decel Time: 275 msec MV E velocity: 105.00 cm/s 103 cm/s  SHUNTS MV A velocity: 79.50 cm/s  70.3 cm/s Systemic VTI:  0.26 m MV E/A ratio:  1.32        1.5       Systemic Diam: 1.90 cm  Dietrich Pates MD Electronically signed by Dietrich Pates MD Signature Date/Time: 09/30/2019/3:25:43 PM    Final    IR PERCUTANEOUS ART THROMBECTOMY/INFUSION INTRACRANIAL INC DIAG ANGIO  Result Date: 10/02/2019 INDICATION: Acute onset of right gaze deviation, dysarthria and left-sided neglect. Occluded mid M2 segment of the inferior division of the right middle cerebral artery on CT angiogram of the head and neck. EXAM: 1. EMERGENT LARGE VESSEL OCCLUSION THROMBOLYSIS (anterior CIRCULATION) COMPARISON:  CT angiogram of the head and neck of September 30, 2019. MEDICATIONS: Ancef 2 g IV antibiotic was administered within 1 hour of the procedure. ANESTHESIA/SEDATION: General anesthesia. CONTRAST:  Isovue 300 approximately 50 cc. FLUOROSCOPY TIME:  Fluoroscopy Time: 23 minutes 6 seconds (1055 mGy). COMPLICATIONS: None immediate. TECHNIQUE: Following a full explanation of the procedure along with the potential associated complications, an informed witnessed consent was obtained from patient's daughter. The risks of intracranial hemorrhage of 10%, worsening neurological deficit, ventilator dependency, death and inability to revascularize were all reviewed in detail with the patient's daughter. The patient was then put under general anesthesia by the Department of Anesthesiology at [facility]. The right groin was prepped and draped in the usual sterile fashion. Thereafter using modified Seldinger technique,  transfemoral access into the right common femoral artery was obtained without difficulty. Over a 0.035 inch guidewire a 5 French Pinnacle sheath was inserted. Through this, and also over a 0.035 inch guidewire a 5 [place] JB 1 catheter was advanced to the aortic arch region and selectively positioned in the innominate artery and the right common carotid artery. FINDINGS: The innominate artery angiogram demonstrates the origins of the right subclavian artery and the right common carotid artery to be widely patent. The right vertebral artery origin is grossly patent. The vessel is seen to opacify to the cranial skull base to opacify the basilar artery on the lateral projection.  Faint opacification of the anterior-inferior cerebellar arteries is seen. The right common carotid arteriogram demonstrates approximately 40-50% narrowing of the origin of the right external carotid artery. The right internal carotid artery at the bulb to the cranial skull base demonstrates wide patency. The petrous, cavernous, and the supraclinoid segments are widely patent. A small infundibulum is seen at the origin of the right posterior communicating artery. The right middle cerebral artery demonstrates early bifurcation a developmental variation. Occlusion of the M2 segment of the inferior division of the right middle cerebral artery is noted with a prominent area of hypoperfusion involving the right posterior frontal, and the subcortical frontal parietal regions. PROCEDURE: The diagnostic JB 1 catheter in the right common carotid artery was then exchanged over a 0.035 inch 300 cm Rosen exchange guidewire for an 8 [place] Pinnacle sheath in the right groin. This was then connected to continuous heparinized saline infusion. Over the [employer] guidewire, an 087 Walrus balloon guide catheter which had been prepped with 50% contrast and 50% heparinized saline infusion was advanced and positioned at the orifice of the right internal carotid  artery. The guidewire was removed. Good aspiration was obtained from the hub of the Walrus balloon guide catheter. Using biplane roadmap technique, a 132 cm 6 [place] Catalyst guide catheter was then advanced over a 0.035 inch Roadrunner guidewire and positioned in the distal petrous segment of the right internal carotid artery. The guidewire was removed. Good aspiration was obtained from the hub of the Catalyst guide catheter. Control arteriogram was then performed centered over the right MCA distribution. Over a 0.014 inch standard Synchro micro guidewire which had a J configuration, an 021 150 cm Trevo ProVue microcatheter was advanced without difficulty into the supraclinoid right ICA. The micro guidewire was then manipulated into the inferior division and passed the M2 segment occlusion into the distal M2 M3 region. The microcatheter was then advanced over the micro guidewire. The guidewire was removed. Good aspiration obtained from the hub of the microcatheter. A gentle control arteriogram performed through the microcatheter demonstrated safe position of tip of the microcatheter. This was then connected to continuous heparinized saline infusion. A 3 mm x 33 mm Trevo ProVue retrieval device was then advanced to the distal end of the microcatheter. The O ring on the delivery microcatheter was loosened. With slight forward gentle traction with the right hand on the delivery micro guidewire, with the left hand the distal and then the proximal portion of the retrieval device was then deployed. The 6 [place] Catalyst guide catheter was advanced to the origin of the inferior division of the right middle cerebral artery. With proximal flow arrest in the right internal carotid artery by inflating the balloon of the [facility] guide catheter and aspiration with a 60 mL syringe at the hub of the [facility] guide catheter, and with a Penumbra aspiration device at the hub of the 6 [place] Catalyst guide catheter over about 2 1/2  minutes, the combination was gently retrieved and removed. With constant Penumbra aspiration on the 6 French Catalyst guide catheter, the retrieval device and the microcatheter were retrieved and removed. A piece of clot was seen in the container of the aspiration device. The proximal flow arrest was reversed. Free aspiration of blood was noted at the hub of the Catalyst guide catheter in the right internal carotid artery. A control arteriogram performed through the 6 [place] Catalyst guide catheter demonstrated complete opacification of the right middle cerebral artery superior and inferior divisions achieving a TICI  3 revascularization. The anterior cerebral artery distribution remained also widely patent. The Catalyst guide catheter was then retrieved and removed. A control arteriogram performed through the [facility] balloon guide catheter in the right common carotid artery demonstrated excellent flow through the extracranial and intracranial right ICA, the right middle cerebral artery and the right anterior cerebral artery. Distribution continued to demonstrate free flow without evidence of intraluminal filling defects or of occlusions. No evidence of extravasation or mass-effect was seen. The Walrus balloon guide catheter was removed. The 8 French Pinnacle sheath in the right groin was then removed with successful hemostasis achieving an 8 [place] Angio-Seal closure device. The right groin appeared soft without evidence of hematoma or bleeding. Distal pulses remained Dopplerable in the dorsalis pedis, and the posterior tibial regions bilaterally unchanged. A flat panel CT of the brain demonstrated no evidence of hemorrhage, mass effect or midline shift. The patient was then extubated without difficulty. Upon recovery, the patient was able to move all four extremities, and obey simple commands. She was transferred to the neuro ICU for post thrombectomy management. IMPRESSION: Status post endovascular complete  revascularization of the M2 segment of the inferior division of the right middle cerebral artery with 1 pass with the 3 mm x 33 mm Trevo ProVue retrieval device, and Penumbra aspiration achieving a TICI 3 revascularization. PLAN: Follow-up as per referring MD. Electronically Signed   By: Julieanne Cotton M.D.   On: 10/01/2019 14:04   CT HEAD CODE STROKE WO CONTRAST  Result Date: 09/30/2019 CLINICAL DATA:  Code stroke. Focal neuro deficit, greater than 6 hours, stroke suspected. Additional history provided: Facial droop, left-sided weakness, history of brain aneurysm. EXAM: CT HEAD WITHOUT CONTRAST TECHNIQUE: Contiguous axial images were obtained from the base of the skull through the vertex without intravenous contrast. COMPARISON:  MRI/MRA head 06/27/2013, head CT 04/10/2013 FINDINGS: Brain: No evidence of acute intracranial hemorrhage. No demarcated cortical infarction. No evidence of intracranial mass. No midline shift or extra-axial fluid collection. Moderate patchy hypodensity within the cerebral white matter is nonspecific, but consistent with chronic small vessel ischemic disease. Redemonstrated chronic lacunar infarct within the inferior left basal ganglia. 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 Mild generalized parenchymal atrophy. Vascular: No hyperdense vessel or unexpected calcification. Skull: Normal. Negative for fracture or focal lesion. Sinuses/Orbits: Rightward gaze deviation. No significant paranasal sinus disease or mastoid effusion at the imaged levels. These results were communicated to Dr. Laurence Slate at 8:35 amon 12/14/2020by text page via the [facility] messaging system. IMPRESSION: No CT evidence of acute intracranial abnormality. Stable generalized parenchymal atrophy and chronic small vessel ischemic disease. Redemonstrated chronic lacunar infarct within the inferior left basal ganglia. Chronic lesions within the globus  pallidus bilaterally, better appreciated on prior MRI 06/27/2013. Electronically Signed   By: Jackey Loge DO   On: 09/30/2019 08:37   VAS [place] LOWER EXTREMITY VENOUS (DVT)  Result Date: 10/01/2019  Lower Venous Study Indications: Embolic stroke.  Risk Factors: Surgery 09/30/2019 - IR WITH ANESTHESIA. Limitations: Poor ultrasound/tissue interface and bandages. Comparison Study: No prior studies. Performing Technologist: Chanda Busing RVT  Examination Guidelines: A complete evaluation includes B-mode imaging, spectral Doppler, color Doppler, and power Doppler as needed of all accessible portions of each vessel. Bilateral testing is considered an integral part of a complete examination. Limited examinations for reoccurring indications may be performed as noted.  +---------+---------------+---------+-----------+----------+--------------+ RIGHT    CompressibilityPhasicitySpontaneityPropertiesThrombus Aging +---------+---------------+---------+-----------+----------+--------------+ CFV      Full  Yes      Yes                                 +---------+---------------+---------+-----------+----------+--------------+ SFJ      Full                                                        +---------+---------------+---------+-----------+----------+--------------+ FV Prox  Full                                                        +---------+---------------+---------+-----------+----------+--------------+ FV Mid   Full                                                        +---------+---------------+---------+-----------+----------+--------------+ FV DistalFull                                                        +---------+---------------+---------+-----------+----------+--------------+ PFV      Full                                                        +---------+---------------+---------+-----------+----------+--------------+ POP      Full           Yes       Yes                                 +---------+---------------+---------+-----------+----------+--------------+ PTV      Full                                                        +---------+---------------+---------+-----------+----------+--------------+ PERO     Full                                                        +---------+---------------+---------+-----------+----------+--------------+   +---------+---------------+---------+-----------+----------+--------------+ LEFT     CompressibilityPhasicitySpontaneityPropertiesThrombus Aging +---------+---------------+---------+-----------+----------+--------------+ CFV      Full           Yes      Yes                                 +---------+---------------+---------+-----------+----------+--------------+ SFJ  Full                                                        +---------+---------------+---------+-----------+----------+--------------+ FV Prox  Full                                                        +---------+---------------+---------+-----------+----------+--------------+ FV Mid   Full                                                        +---------+---------------+---------+-----------+----------+--------------+ FV DistalFull                                                        +---------+---------------+---------+-----------+----------+--------------+ PFV      Full                                                        +---------+---------------+---------+-----------+----------+--------------+ POP      Full           Yes      Yes                                 +---------+---------------+---------+-----------+----------+--------------+ PTV      Full                                                        +---------+---------------+---------+-----------+----------+--------------+ PERO     Full                                                         +---------+---------------+---------+-----------+----------+--------------+     Summary: Right: There is no evidence of deep vein thrombosis in the lower extremity. However, portions of this examination were limited- see technologist comments above. No cystic structure found in the popliteal fossa. Left: There is no evidence of deep vein thrombosis in the lower extremity. No cystic structure found in the popliteal fossa.  *See table(s) above for measurements and observations. Electronically signed by Waverly Ferrari MD on 10/01/2019 at 2:40:52 PM.    Final    IR ANGIO VERTEBRAL SEL SUBCLAVIAN INNOMINATE UNI R MOD SED  Result Date: 10/03/2019 INDICATION: Acute onset of right gaze deviation, dysarthria and left-sided neglect. Occluded mid M2 segment of the inferior division of  the right middle cerebral artery on CT angiogram of the head and neck.  EXAM: 1. EMERGENT LARGE VESSEL OCCLUSION THROMBOLYSIS (anterior CIRCULATION)  COMPARISON:  CT angiogram of the head and neck of September 30, 2019.  MEDICATIONS: Ancef 2 g IV antibiotic was administered within 1 hour of the procedure.  ANESTHESIA/SEDATION: General anesthesia.  CONTRAST:  Isovue 300 approximately 50 cc.  FLUOROSCOPY TIME:  Fluoroscopy Time: 23 minutes 6 seconds (1055 mGy).  COMPLICATIONS: None immediate.  TECHNIQUE: Following a full explanation of the procedure along with the potential associated complications, an informed witnessed consent was obtained from patient's daughter. The risks of intracranial hemorrhage of 10%, worsening neurological deficit, ventilator dependency, death and inability to revascularize were all reviewed in detail with the patient's daughter.  The patient was then put under general anesthesia by the Department of Anesthesiology at [facility].  The right groin was prepped and draped in the usual sterile fashion. Thereafter using modified Seldinger technique, transfemoral access into the right common femoral  artery was obtained without difficulty. Over a 0.035 inch guidewire a 5 French Pinnacle sheath was inserted. Through this, and also over a 0.035 inch guidewire a 5 [place] JB 1 catheter was advanced to the aortic arch region and selectively positioned in the innominate artery and the right common carotid artery.  FINDINGS: The innominate artery angiogram demonstrates the origins of the right subclavian artery and the right common carotid artery to be widely patent.  The right vertebral artery origin is grossly patent. The vessel is seen to opacify to the cranial skull base to opacify the basilar artery on the lateral projection. Faint opacification of the anterior-inferior cerebellar arteries is seen.  The right common carotid arteriogram demonstrates approximately 40-50% narrowing of the origin of the right external carotid artery.  The right internal carotid artery at the bulb to the cranial skull base demonstrates wide patency. The petrous, cavernous, and the supraclinoid segments are widely patent.  A small infundibulum is seen at the origin of the right posterior communicating artery.  The right middle cerebral artery demonstrates early bifurcation a developmental variation.  Occlusion of the M2 segment of the inferior division of the right middle cerebral artery is noted with a prominent area of hypoperfusion involving the right posterior frontal, and the subcortical frontal parietal regions.  PROCEDURE: The diagnostic JB 1 catheter in the right common carotid artery was then exchanged over a 0.035 inch 300 cm Rosen exchange guidewire for an 8 [place] Pinnacle sheath in the right groin. This was then connected to continuous heparinized saline infusion. Over the [employer] guidewire, an 087 Walrus balloon guide catheter which had been prepped with 50% contrast and 50% heparinized saline infusion was advanced and positioned at the orifice of the right internal carotid artery.  The guidewire was  removed. Good aspiration was obtained from the hub of the Walrus balloon guide catheter. Using biplane roadmap technique, a 132 cm 6 [place] Catalyst guide catheter was then advanced over a 0.035 inch Roadrunner guidewire and positioned in the distal petrous segment of the right internal carotid artery. The guidewire was removed. Good aspiration was obtained from the hub of the Catalyst guide catheter. Control arteriogram was then performed centered over the right MCA distribution.  Over a 0.014 inch standard Synchro micro guidewire which had a J configuration, an 021 150 cm Trevo ProVue microcatheter was advanced without difficulty into the supraclinoid right ICA.  The micro guidewire was then manipulated into the inferior division and passed the  M2 segment occlusion into the distal M2 M3 region. The microcatheter was then advanced over the micro guidewire. The guidewire was removed. Good aspiration obtained from the hub of the microcatheter. A gentle control arteriogram performed through the microcatheter demonstrated safe position of tip of the microcatheter. This was then connected to continuous heparinized saline infusion.  A 3 mm x 33 mm Trevo ProVue retrieval device was then advanced to the distal end of the microcatheter.  The O ring on the delivery microcatheter was loosened. With slight forward gentle traction with the right hand on the delivery micro guidewire, with the left hand the distal and then the proximal portion of the retrieval device was then deployed. The 6 [place] Catalyst guide catheter was advanced to the origin of the inferior division of the right middle cerebral artery.  With proximal flow arrest in the right internal carotid artery by inflating the balloon of the [facility] guide catheter and aspiration with a 60 mL syringe at the hub of the [facility] guide catheter, and with a Penumbra aspiration device at the hub of the 6 [place] Catalyst guide catheter over about 2 1/2 minutes, the  combination was gently retrieved and removed. With constant Penumbra aspiration on the 6 French Catalyst guide catheter, the retrieval device and the microcatheter were retrieved and removed. A piece of clot was seen in the container of the aspiration device.  The proximal flow arrest was reversed. Free aspiration of blood was noted at the hub of the Catalyst guide catheter in the right internal carotid artery. A control arteriogram performed through the 6 [place] Catalyst guide catheter demonstrated complete opacification of the right middle cerebral artery superior and inferior divisions achieving a TICI 3 revascularization. The anterior cerebral artery distribution remained also widely patent.  The Catalyst guide catheter was then retrieved and removed. A control arteriogram performed through the [facility] balloon guide catheter in the right common carotid artery demonstrated excellent flow through the extracranial and intracranial right ICA, the right middle cerebral artery and the right anterior cerebral artery. Distribution continued to demonstrate free flow without evidence of intraluminal filling defects or of occlusions.  No evidence of extravasation or mass-effect was seen.  The [place] balloon guide catheter was removed. The 8 French Pinnacle sheath in the right groin was then removed with successful hemostasis achieving an 8 [place] Angio-Seal closure device.  The right groin appeared soft without evidence of hematoma or bleeding. Distal pulses remained Dopplerable in the dorsalis pedis, and the posterior tibial regions bilaterally unchanged.  A flat panel CT of the brain demonstrated no evidence of hemorrhage, mass effect or midline shift.  The patient was then extubated without difficulty. Upon recovery, the patient was able to move all four extremities, and obey simple commands. She was transferred to the neuro ICU for post thrombectomy management.  IMPRESSION: Status post endovascular complete  revascularization of the M2 segment of the inferior division of the right middle cerebral artery with 1 pass with the 3 mm x 33 mm Trevo ProVue retrieval device, and Penumbra aspiration achieving a TICI 3 revascularization.  PLAN: Follow-up as per referring MD.   Electronically Signed   By: Julieanne Cotton M.D.   On: 10/01/2019 14:04    Assessment & Plan:  Plan  I am having Blase Mess "Almyra Free" start on hydrocortisone and hydrocortisone. I am also having her maintain her potassium chloride SA, levothyroxine, furosemide, amLODipine, diclofenac sodium, acetaminophen, atorvastatin, and apixaban.  Meds ordered this encounter  Medications  . hydrocortisone (ANUSOL-HC)  25 MG suppository    Sig: Place 1 suppository (25 mg total) rectally 2 (two) times daily.    Dispense:  12 suppository    Refill:  0  . hydrocortisone (ANUSOL-HC) 2.5 % rectal cream    Sig: Place 1 application rectally 2 (two) times daily.    Dispense:  30 g    Refill:  0    Problem List Items Addressed This Visit      Unprioritized   Atrial fibrillation (HCC) - Primary    con't eliquis F/u cardiology      Relevant Orders   Ambulatory referral to Cardiology   CBC with Differential (Completed)   Comprehensive metabolic panel (Completed)   TSH (Completed)   Lipid panel (Completed)   DIARRHEA    Heme neg stool today Refer to GI      Relevant Orders   Ambulatory referral to Gastroenterology   Stool Culture   Clostridium difficile Toxin B, Qualitative, Real-Time PCR([facility])   Essential hypertension    Well controlled, no changes to meds. Encouraged heart healthy diet such as the DASH diet and exercise as tolerated.       Relevant Orders   CBC with Differential (Completed)   Comprehensive metabolic panel (Completed)   External hemorrhoid, bleeding   Relevant Medications   hydrocortisone (ANUSOL-HC) 25 MG suppository   hydrocortisone (ANUSOL-HC) 2.5 % rectal cream   Other Relevant Orders   Clostridium  difficile Toxin B, Qualitative, Real-Time PCR([facility])   Hyperlipidemia   Hyperlipidemia associated with type 2 diabetes mellitus (HCC)   Relevant Orders   Comprehensive metabolic panel (Completed)   Lipid panel (Completed)   Hypothyroidism    Check labs con't meds      Relevant Orders   TSH (Completed)   Memory loss    F/u neuro         Follow-up: Return in about 6 months (around 04/07/2020), or if symptoms worsen or fail to improve, for annual exam, fasting.  [name], DO

## 2019-10-12 NOTE — Assessment & Plan Note (Signed)
con't eliquis F/u cardiology

## 2019-10-12 NOTE — Assessment & Plan Note (Signed)
Check labs con't meds 

## 2019-10-12 NOTE — Assessment & Plan Note (Signed)
Heme neg stool today Refer to GI

## 2019-10-15 ENCOUNTER — Other Ambulatory Visit: Payer: Self-pay

## 2019-10-15 ENCOUNTER — Other Ambulatory Visit: Payer: Self-pay | Admitting: *Deleted

## 2019-10-15 NOTE — Patient Outreach (Addendum)
[facility]) Care Management  10/15/2019  Belinda Anderson 02-16-1932 [id number]   Follow for EMMI stroke (on APL list) patient New EMMI alert EMMI- stroke  RED ON EMMI ALERT Day # 9 Date: Saturday October 12 2019 1000 Red Alert Reason: Sad, hopeless, anxious, or empty? 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  Outreach attempt # 3 successful  HIPAA confirmed with Belinda Anderson daughter Belinda Anderson has memory issues  Belinda Anderson confirms with [facility] RN CM that Belinda Greenley now has all her scheduled appointments  She saw Dr Estanislado Pandy on October 04 2019 And will see Frann Rider on 11/13/19 at 11  They are still getting EMMI calls  Reviewed last EMMI stroke red alert Patient with dementia and change in quality of life related to disease process per daugther  Will continue to monitor, no [facility] SW referral at this time Discussed any future [facility] RN CM call after any red alerts  Belinda Anderson agreed with follow up in one month   Social: 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 One of her children or sister stay with her at all times  Belinda Anderson provides transportation to medical appointments Belinda [place] is able to ambulate, feed, dresses, bathe herself but needs assist with all IADLs (instrumental Activities of Daily Living)  Conditions: HTN, Atrial fibrillation, goiter, memory los, diarrhea, syncope , cerebral aneurysm rupture, stroke due to embolism of RMCA, HLD, right leg swelling, arthritis, thyroid disease   DME: cane eyeglasses  Consent: [facility] RN CM reviewed [facility] services with  patient. Patient gave verbal consent for services [facility] telephonic RN CM.   Advised patient that there will be further automated EMMI-post discharge calls to assess how the patient is doing following the recent hospitalization Advised the patient that another call may be received from a nurse if any of their responses were abnormal. Patient voiced understanding and was appreciative of f/u call.  Plan [facility] RN CM will follow up with 28-35 days as discussed and agreed upon to review HTN and atrial fibrillation home care   Pt encouraged to return a call to [facility] RN CM prn   Routed note to MDs/NP/PA   Joelene Millin L. Lavina Hamman, RN, BSN, [place] Coordinator Office number [phone] Mobile number [phone]  Main [facility] number [phone] Fax number [phone]

## 2019-10-16 ENCOUNTER — Encounter: Payer: Self-pay | Admitting: Nurse Practitioner

## 2019-10-16 ENCOUNTER — Ambulatory Visit: Payer: Medicare Other | Admitting: Nurse Practitioner

## 2019-10-16 VITALS — BP 110/60 | HR 73 | Temp 98.4°F | Ht 64.0 in | Wt 145.0 lb

## 2019-10-16 DIAGNOSIS — K648 Other hemorrhoids: Secondary | ICD-10-CM | POA: Diagnosis not present

## 2019-10-16 DIAGNOSIS — K625 Hemorrhage of anus and rectum: Secondary | ICD-10-CM

## 2019-10-16 NOTE — Patient Instructions (Addendum)
If you are age 83 or older, your body mass index should be between 23-30. Your Body mass index is 24.89 kg/m. If this is out of the aforementioned range listed, please consider follow up with your Primary Care Provider.  If you are age 85 or younger, your body mass index should be between 19-25. Your Body mass index is 24.89 kg/m. If this is out of the aformentioned range listed, please consider follow up with your Primary Care Provider.   You are being referred to Dr. Nadeen Landau at [facility] for abnormal appearing hemorrhoid.  They will contact you with an appointment.  Thank you for choosing me and [facility].   Tye Savoy, NP

## 2019-10-16 NOTE — Progress Notes (Signed)
ASSESSMENT / PLAN:    85 yo female with blood in stool for undetermined length of time but in absence of anemia. On anoscopy she has what appears to be a prolapsing internal hemorrhoid with an abnormal appearing surface.  -Referral to Dr. Cliffton Asters at [facility] for further evaluation.     HPI:    Referring Provider: Dr. Laury Axon      Reason for referral:    Rectal bleeding  Chief Complaint:   None per patient.   Patient is an 85 year old female with history of atrial fibrillation on anticoagulant, hypothyroidism and dementia. She is a former patient of Dr. Jarold Motto, Here with daughter for evalatuon of loose stool with blood.  Recently daughter has been checking on/staying with her mother.  She has noticed some smears of stool with blood on the toilet seat she feels like it is a new problem .  Patient denies bowel changes, diarrhea or blood in stool.  She is agitated about being here .  Patient says if she was having diarrhea then she would certainly get help for it and in fact is lucky to even have bowel movements on some days. Under no circumstances would patient agree to lower endoscopy.    Past Medical History:  Diagnosis Date  . Arthritis   . Atrial fibrillation (HCC)   . Hypertension   . Thyroid disease   . TIA (transient ischemic attack)      Past Surgical History:  Procedure Laterality Date  . EYE SURGERY     lens implant  . IR ANGIO VERTEBRAL SEL SUBCLAVIAN INNOMINATE UNI R MOD SED  09/30/2019  . IR CT HEAD LTD  09/30/2019  . IR PERCUTANEOUS ART THROMBECTOMY/INFUSION INTRACRANIAL INC DIAG ANGIO  09/30/2019  . RADIOLOGY WITH ANESTHESIA N/A 09/30/2019   Procedure: IR WITH ANESTHESIA;  Surgeon: Julieanne Cotton, MD;  Location: [facility] OR;  Service: Radiology;  Laterality: N/A;   Family History  Problem Relation Age of Onset  . Arthritis Mother   . Heart disease Mother   . Heart disease Father 69       MI  . Coronary artery disease Other   . Diabetes Brother          DI   Social History   Tobacco Use  . Smoking status: Never Smoker  . Smokeless tobacco: Never Used  Substance Use Topics  . Alcohol use: No  . Drug use: No   Current Outpatient Medications  Medication Sig Dispense Refill  . acetaminophen (TYLENOL) 650 MG CR tablet Take 1,300 mg by mouth 3 (three) times daily.    Marland Kitchen amLODipine (NORVASC) 2.5 MG tablet TAKE 1 TABLET BY MOUTH ONCE DAILY (Patient taking differently: Take 2.5 mg by mouth daily. ) 90 tablet 1  . apixaban (ELIQUIS) 2.5 MG TABS tablet Take 1 tablet (2.5 mg total) by mouth 2 (two) times daily. 60 tablet 2  . atorvastatin (LIPITOR) 40 MG tablet Take 1 tablet (40 mg total) by mouth daily at 6 PM. 30 tablet 2  . diclofenac sodium (VOLTAREN) 1 % GEL Apply 4 g topically 4 (four) times daily. (Patient taking differently: Apply 4 g topically 4 (four) times daily as needed (joint pain). ) 100 g 2  . furosemide (LASIX) 40 MG tablet Take 1 tablet (40 mg total) by mouth daily. 90 tablet 3  . hydrocortisone (ANUSOL-HC) 2.5 % rectal cream Place 1 application rectally 2 (two) times  daily. 30 g 0  . hydrocortisone (ANUSOL-HC) 25 MG suppository Place 1 suppository (25 mg total) rectally 2 (two) times daily. 12 suppository 0  . levothyroxine (SYNTHROID) 75 MCG tablet TAKE 1 TABLET(75 MCG) BY MOUTH DAILY (Patient taking differently: Take 75 mcg by mouth daily. ) 90 tablet 3  . potassium chloride SA (K-DUR) 20 MEQ tablet Take 1 tablet (20 mEq total) by mouth daily. 90 tablet 1   No current facility-administered medications for this visit.   No Known Allergies   Review of Systems: All systems reviewed and negative except where noted in HPI.   Serum creatinine: 1.8 mg/dL (H) 10/08/19 1525 Estimated creatinine clearance: 21.3 mL/min (A)   Physical Exam:    Wt Readings from Last 3 Encounters:  10/16/19 145 lb (65.8 kg)  10/08/19 148 lb 6.4 oz (67.3 kg)  09/30/19 149 lb (67.6 kg)    BP 110/60   Pulse 73   Temp 98.4 F (36.9 C)    Ht 5\' 4"  (1.626 m)   Wt 145 lb (65.8 kg)   BMI 24.89 kg/m  Constitutional:  Pleasant female in no acute distress. Psychiatric: Agitated at times but overall pleasant and cooperative.  EENT: Pupils normal.  Conjunctivae are normal. No scleral icterus. Neck supple.  Cardiovascular: Normal rate, regular rhythm. No edema Pulmonary/chest: Effort normal and breath sounds normal. No wheezing, rales or rhonchi. Abdominal: Soft, nondistended, nontender. Bowel sounds active throughout. There are no masses palpable. No hepatomegaly Rectal: Subtle area of thickening felt on DRE. Anoscopy : prolapsing hemorrhoid ? The surface was abnormal containing a few white raised areas.. Neurological: Alert and oriented to person place and time. Skin: Skin is warm and dry. No rashes noted.  Tye Savoy, NP  10/16/2019, 2:47 PM  Cc: [name], [name], *

## 2019-10-17 ENCOUNTER — Other Ambulatory Visit: Payer: Self-pay

## 2019-10-17 ENCOUNTER — Encounter: Payer: Self-pay | Admitting: Nurse Practitioner

## 2019-10-21 ENCOUNTER — Ambulatory Visit: Payer: Medicare Other | Admitting: Family Medicine

## 2019-10-21 ENCOUNTER — Other Ambulatory Visit: Payer: Self-pay

## 2019-10-21 ENCOUNTER — Encounter: Payer: Self-pay | Admitting: Family Medicine

## 2019-10-21 DIAGNOSIS — E1169 Type 2 diabetes mellitus with other specified complication: Secondary | ICD-10-CM

## 2019-10-21 DIAGNOSIS — M7989 Other specified soft tissue disorders: Secondary | ICD-10-CM | POA: Diagnosis not present

## 2019-10-21 DIAGNOSIS — E785 Hyperlipidemia, unspecified: Secondary | ICD-10-CM

## 2019-10-21 DIAGNOSIS — I10 Essential (primary) hypertension: Secondary | ICD-10-CM

## 2019-10-21 DIAGNOSIS — E039 Hypothyroidism, unspecified: Secondary | ICD-10-CM | POA: Diagnosis not present

## 2019-10-21 DIAGNOSIS — I63411 Cerebral infarction due to embolism of right middle cerebral artery: Secondary | ICD-10-CM

## 2019-10-21 DIAGNOSIS — E876 Hypokalemia: Secondary | ICD-10-CM

## 2019-10-21 DIAGNOSIS — I480 Paroxysmal atrial fibrillation: Secondary | ICD-10-CM

## 2019-10-21 MED ORDER — FUROSEMIDE 40 MG PO TABS: 40.0000 mg | ORAL_TABLET | Freq: Every day | ORAL | 3 refills | Status: DC

## 2019-10-21 MED ORDER — POTASSIUM CHLORIDE CRYS ER 20 MEQ PO TBCR: 20.0000 meq | EXTENDED_RELEASE_TABLET | Freq: Every day | ORAL | 1 refills | Status: DC

## 2019-10-21 MED ORDER — LEVOTHYROXINE SODIUM 75 MCG PO TABS: ORAL_TABLET | ORAL | 3 refills | Status: DC

## 2019-10-21 MED ORDER — AMLODIPINE BESYLATE 2.5 MG PO TABS: ORAL_TABLET | ORAL | 1 refills | Status: DC

## 2019-10-21 NOTE — Progress Notes (Signed)
Agree with the assessment and plan as outlined by Willette Cluster, NP. I examined the patient as well. Rectal exam with prolapsed Grade 2-3 hemorrhoid with irritated, somewhat mottled appearance with white superficial appearance. Whiel irritated prolapsing hemorrhoid most likely, unclear if dysplastic tissue present as well. Recommended referral to ColoRectal Surgery for further evaluation with treatment and/or biopsy as appropriate. Discussed with patient and her daughter.   Kallon Caylor, DO, [facility]

## 2019-10-21 NOTE — Assessment & Plan Note (Signed)
Encouraged heart healthy diet, increase exercise, avoid trans fats, consider a krill oil cap daily 

## 2019-10-21 NOTE — Assessment & Plan Note (Signed)
On eliquis No new symptoms

## 2019-10-21 NOTE — Assessment & Plan Note (Signed)
Well controlled, no changes to meds. Encouraged heart healthy diet such as the DASH diet and exercise as tolerated.  °

## 2019-10-21 NOTE — Assessment & Plan Note (Signed)
Lab Results  Component Value Date   TSH 0.62 10/08/2019

## 2019-10-21 NOTE — Patient Instructions (Signed)
DASH Eating Plan DASH stands for "Dietary Approaches to Stop Hypertension." The DASH eating plan is a healthy eating plan that has been shown to reduce high blood pressure (hypertension). It may also reduce your risk for type 2 diabetes, heart disease, and stroke. The DASH eating plan may also help with weight loss. What are tips for following this plan?  General guidelines  Avoid eating more than 2,300 mg (milligrams) of salt (sodium) a day. If you have hypertension, you may need to reduce your sodium intake to 1,500 mg a day.  Limit alcohol intake to no more than 1 drink a day for nonpregnant women and 2 drinks a day for men. One drink equals 12 oz of beer, 5 oz of wine, or 1 oz of hard liquor.  Work with your health care provider to maintain a healthy body weight or to lose weight. Ask what an ideal weight is for you.  Get at least 30 minutes of exercise that causes your heart to beat faster (aerobic exercise) most days of the week. Activities may include walking, swimming, or biking.  Work with your health care provider or diet and nutrition specialist (dietitian) to adjust your eating plan to your individual calorie needs. Reading food labels   Check food labels for the amount of sodium per serving. Choose foods with less than 5 percent of the Daily Value of sodium. Generally, foods with less than 300 mg of sodium per serving fit into this eating plan.  To find whole grains, look for the word "whole" as the first word in the ingredient list. Shopping  Buy products labeled as "low-sodium" or "no salt added."  Buy fresh foods. Avoid canned foods and premade or frozen meals. Cooking  Avoid adding salt when cooking. Use salt-free seasonings or herbs instead of table salt or sea salt. Check with your health care provider or pharmacist before using salt substitutes.  Do not fry foods. Cook foods using healthy methods such as baking, boiling, grilling, and broiling instead.  Cook with  heart-healthy oils, such as olive, canola, soybean, or sunflower oil. Meal planning  Eat a balanced diet that includes: ? 5 or more servings of fruits and vegetables each day. At each meal, try to fill half of your plate with fruits and vegetables. ? Up to 6-8 servings of whole grains each day. ? Less than 6 oz of lean meat, poultry, or fish each day. A 3-oz serving of meat is about the same size as a deck of cards. One egg equals 1 oz. ? 2 servings of low-fat dairy each day. ? A serving of nuts, seeds, or beans 5 times each week. ? Heart-healthy fats. Healthy fats called Omega-3 fatty acids are found in foods such as flaxseeds and coldwater fish, like sardines, salmon, and mackerel.  Limit how much you eat of the following: ? Canned or prepackaged foods. ? Food that is high in trans fat, such as fried foods. ? Food that is high in saturated fat, such as fatty meat. ? Sweets, desserts, sugary drinks, and other foods with added sugar. ? Full-fat dairy products.  Do not salt foods before eating.  Try to eat at least 2 vegetarian meals each week.  Eat more home-cooked food and less restaurant, buffet, and fast food.  When eating at a restaurant, ask that your food be prepared with less salt or no salt, if possible. What foods are recommended? The items listed may not be a complete list. Talk with your dietitian about   what dietary choices are best for you. Grains Whole-grain or whole-wheat bread. Whole-grain or whole-wheat pasta. Brown rice. Oatmeal. Quinoa. Bulgur. Whole-grain and low-sodium cereals. Pita bread. Low-fat, low-sodium crackers. Whole-wheat flour tortillas. Vegetables Fresh or frozen vegetables (raw, steamed, roasted, or grilled). Low-sodium or reduced-sodium tomato and vegetable juice. Low-sodium or reduced-sodium tomato sauce and tomato paste. Low-sodium or reduced-sodium canned vegetables. Fruits All fresh, dried, or frozen fruit. Canned fruit in natural juice (without  added sugar). Meat and other protein foods Skinless chicken or turkey. Ground chicken or turkey. Pork with fat trimmed off. Fish and seafood. Egg whites. Dried beans, peas, or lentils. Unsalted nuts, nut butters, and seeds. Unsalted canned beans. Lean cuts of beef with fat trimmed off. Low-sodium, lean deli meat. Dairy Low-fat (1%) or fat-free (skim) milk. Fat-free, low-fat, or reduced-fat cheeses. Nonfat, low-sodium ricotta or cottage cheese. Low-fat or nonfat yogurt. Low-fat, low-sodium cheese. Fats and oils Soft margarine without trans fats. Vegetable oil. Low-fat, reduced-fat, or light mayonnaise and salad dressings (reduced-sodium). Canola, safflower, olive, soybean, and sunflower oils. Avocado. Seasoning and other foods Herbs. Spices. Seasoning mixes without salt. Unsalted popcorn and pretzels. Fat-free sweets. What foods are not recommended? The items listed may not be a complete list. Talk with your dietitian about what dietary choices are best for you. Grains Baked goods made with fat, such as croissants, muffins, or some breads. Dry pasta or rice meal packs. Vegetables Creamed or fried vegetables. Vegetables in a cheese sauce. Regular canned vegetables (not low-sodium or reduced-sodium). Regular canned tomato sauce and paste (not low-sodium or reduced-sodium). Regular tomato and vegetable juice (not low-sodium or reduced-sodium). Pickles. Olives. Fruits Canned fruit in a light or heavy syrup. Fried fruit. Fruit in cream or butter sauce. Meat and other protein foods Fatty cuts of meat. Ribs. Fried meat. Bacon. Sausage. Bologna and other processed lunch meats. Salami. Fatback. Hotdogs. Bratwurst. Salted nuts and seeds. Canned beans with added salt. Canned or smoked fish. Whole eggs or egg yolks. Chicken or turkey with skin. Dairy Whole or 2% milk, cream, and half-and-half. Whole or full-fat cream cheese. Whole-fat or sweetened yogurt. Full-fat cheese. Nondairy creamers. Whipped toppings.  Processed cheese and cheese spreads. Fats and oils Butter. Stick margarine. Lard. Shortening. Ghee. Bacon fat. Tropical oils, such as coconut, palm kernel, or palm oil. Seasoning and other foods Salted popcorn and pretzels. Onion salt, garlic salt, seasoned salt, table salt, and sea salt. Worcestershire sauce. Tartar sauce. Barbecue sauce. Teriyaki sauce. Soy sauce, including reduced-sodium. Steak sauce. Canned and packaged gravies. Fish sauce. Oyster sauce. Cocktail sauce. Horseradish that you find on the shelf. Ketchup. Mustard. Meat flavorings and tenderizers. Bouillon cubes. Hot sauce and Tabasco sauce. Premade or packaged marinades. Premade or packaged taco seasonings. Relishes. Regular salad dressings. Where to find more information:  National Heart, Lung, and Blood Institute: www.nhlbi.nih.gov  American Heart Association: www.heart.org Summary  The DASH eating plan is a healthy eating plan that has been shown to reduce high blood pressure (hypertension). It may also reduce your risk for type 2 diabetes, heart disease, and stroke.  With the DASH eating plan, you should limit salt (sodium) intake to 2,300 mg a day. If you have hypertension, you may need to reduce your sodium intake to 1,500 mg a day.  When on the DASH eating plan, aim to eat more fresh fruits and vegetables, whole grains, lean proteins, low-fat dairy, and heart-healthy fats.  Work with your health care provider or diet and nutrition specialist (dietitian) to adjust your eating plan to your   individual calorie needs. This information is not intended to replace advice given to you by your health care provider. Make sure you discuss any questions you have with your health care provider. Document Revised: 09/15/2017 Document Reviewed: 09/26/2016 Elsevier Patient Education  2020 [employer].  

## 2019-10-21 NOTE — Progress Notes (Signed)
Patient ID: Belinda Anderson, female    DOB: 01-27-1932  Age: 86 y.o. MRN: [id number]    Subjective:  Subjective  HPI Belinda Anderson presents for f/u -- labs were done last month.  She has had some sob the daughter noticed but the pt has no complaints.  Hosp labs/ imaging reviewed again-- pt has card app first week in Feb.  No Chest pain.    Pt has had app with GI-- they are setting her up with Colorectal surgeon.    Review of Systems  Constitutional: Negative for activity change, appetite change, chills, fatigue, fever and unexpected weight change.  HENT: Negative for congestion and hearing loss.   Eyes: Negative for discharge.  Respiratory: Negative for cough and shortness of breath.   Cardiovascular: Negative for chest pain, palpitations and leg swelling.  Gastrointestinal: Negative for abdominal pain, blood in stool, constipation, diarrhea, nausea and vomiting.  Genitourinary: Negative for dysuria, frequency, hematuria and urgency.  Musculoskeletal: Negative for back pain and myalgias.  Skin: Negative for rash.  Allergic/Immunologic: Negative for environmental allergies.  Neurological: Negative for dizziness, weakness and headaches.  Hematological: Does not bruise/bleed easily.  Psychiatric/Behavioral: Negative for behavioral problems, dysphoric mood and suicidal ideas. The patient is not nervous/anxious.     History Past Medical History:  Diagnosis Date  . Arthritis   . Atrial fibrillation (HCC)   . Hypertension   . Thyroid disease   . TIA (transient ischemic attack)     She has a past surgical history that includes Eye surgery; Radiology with anesthesia (N/A, 09/30/2019); IR PERCUTANEOUS ART THROMBECTOMY/INFUSION INTRACRANIAL INC DIAG ANGIO (09/30/2019); IR CT Head Ltd (09/30/2019); and IR ANGIO VERTEBRAL SEL SUBCLAVIAN INNOMINATE UNI R MOD SED (09/30/2019).   Her family history includes Arthritis in her mother; Coronary artery disease in an other family member; Diabetes in  her brother; Heart disease in her mother; Heart disease (age of onset: 76) in her father.She reports that she has never smoked. She has never used smokeless tobacco. She reports that she does not drink alcohol or use drugs.  Current Outpatient Medications on File Prior to Visit  Medication Sig Dispense Refill  . acetaminophen (TYLENOL) 650 MG CR tablet Take 1,300 mg by mouth 3 (three) times daily.    Marland Kitchen apixaban (ELIQUIS) 2.5 MG TABS tablet Take 1 tablet (2.5 mg total) by mouth 2 (two) times daily. 60 tablet 2  . atorvastatin (LIPITOR) 40 MG tablet Take 1 tablet (40 mg total) by mouth daily at 6 PM. 30 tablet 2  . diclofenac sodium (VOLTAREN) 1 % GEL Apply 4 g topically 4 (four) times daily. (Patient taking differently: Apply 4 g topically 4 (four) times daily as needed (joint pain). ) 100 g 2  . hydrocortisone (ANUSOL-HC) 2.5 % rectal cream Place 1 application rectally 2 (two) times daily. 30 g 0  . hydrocortisone (ANUSOL-HC) 25 MG suppository Place 1 suppository (25 mg total) rectally 2 (two) times daily. 12 suppository 0   No current facility-administered medications on file prior to visit.     Objective:  Objective  Physical Exam Vitals and nursing note reviewed.  Constitutional:      Appearance: She is well-developed.  HENT:     Head: Normocephalic and atraumatic.  Eyes:     Conjunctiva/sclera: Conjunctivae normal.  Neck:     Thyroid: No thyromegaly.     Vascular: No carotid bruit or JVD.  Cardiovascular:     Rate and Rhythm: Normal rate and regular rhythm.     Heart  sounds: Normal heart sounds. No murmur.  Pulmonary:     Effort: Pulmonary effort is normal. No respiratory distress.     Breath sounds: Normal breath sounds. No wheezing or rales.  Chest:     Chest wall: No tenderness.  Musculoskeletal:     Cervical back: Normal range of motion and neck supple.  Neurological:     Mental Status: She is alert and oriented to person, place, and time.    BP 132/60 (BP Location:  Right Arm, Patient Position: Sitting, Cuff Size: Normal)   Pulse (!) 48   Temp (!) 97.1 F (36.2 C) (Temporal)   Resp 18   Ht 5\' 4"  (1.626 m)   Wt 146 lb 9.6 oz (66.5 kg)   SpO2 98%   BMI 25.16 kg/m  Wt Readings from Last 3 Encounters:  10/21/19 146 lb 9.6 oz (66.5 kg)  10/16/19 145 lb (65.8 kg)  10/08/19 148 lb 6.4 oz (67.3 kg)     Lab Results  Component Value Date   WBC 6.9 10/08/2019   HGB 13.0 10/08/2019   HCT 39.7 10/08/2019   PLT 307.0 10/08/2019   GLUCOSE 108 (H) 10/08/2019   CHOL 118 10/08/2019   TRIG 155.0 (H) 10/08/2019   HDL 39.10 10/08/2019   LDLDIRECT 97.0 04/06/2018   LDLCALC 48 10/08/2019   ALT 14 10/08/2019   AST 17 10/08/2019   NA 141 10/08/2019   K 3.8 10/08/2019   CL 106 10/08/2019   CREATININE 1.80 (H) 10/08/2019   BUN 23 10/08/2019   CO2 24 10/08/2019   TSH 0.62 10/08/2019   INR 1.0 09/30/2019   HGBA1C 5.9 (H) 10/01/2019    CT Angio Head W or Wo Contrast  Result Date: 09/30/2019 CLINICAL DATA:  Facial droop and left-sided weakness EXAM: CT ANGIOGRAPHY HEAD AND NECK CT PERFUSION BRAIN TECHNIQUE: Multidetector CT imaging of the head and neck was performed using the standard protocol during bolus administration of intravenous contrast. Multiplanar CT image reconstructions and MIPs were obtained to evaluate the vascular anatomy. Carotid stenosis measurements (when applicable) are obtained utilizing NASCET criteria, using the distal internal carotid diameter as the denominator. Multiphase CT imaging of the brain was performed following IV bolus contrast injection. Subsequent parametric perfusion maps were calculated using RAPID software. CONTRAST:  OMNIPAQUE IOHEXOL 350 MG/ML SOLN COMPARISON:  Head CT earlier today.  Brain MRI 06/27/2013 FINDINGS: CTA NECK FINDINGS Aortic arch: 2 vessel branching. Right carotid system: Atheromatous wall thickening of the common carotid to the level of the bifurcation. No flow limiting stenosis or ulceration. Negative  for beading. Left carotid system: Calcified plaque at the bifurcation that is mild. No stenosis or ulceration. Vertebral arteries: Mild subclavian atherosclerosis on the right, more moderate on the left, without flow limiting stenosis or ulceration. Mild-to-moderate atheromatous narrowing at the left vertebral origin due to calcified plaque. Skeleton: Diffuse degenerative disease of the cervical spine. Other neck: No acute or aggressive finding. Postoperative right thyroid. Upper chest: Biapical pleural based scarring Review of the MIP images confirms the above findings CTA HEAD FINDINGS Anterior circulation: Mild atherosclerotic plaque along the carotid siphons. There is a severe right M2 branch occlusion at the level of the insula with the downstream distal right M3 branch occlusion. Atherosclerotic irregularity of medium size vessels. Negative for aneurysm. Bilateral early branching MCA. Posterior circulation: Fenestrated left V4 segment and proximal basilar. 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 Symmetric flow in the posterior  cerebral arteries. Venous sinuses: Negative Anatomic variants: As above Review of the MIP images confirms the above findings CT Brain Perfusion Findings: ASPECTS: 10 CBF (<30%) Volume: 0mL Perfusion (Tmax>6.0s) volume: 13mL These results were called by telephone at the time of interpretation on 09/30/2019 at 9:01 am to provider Arther Dames , who verbally acknowledged these results. IMPRESSION: 1. 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 This is associated with 13 cc of penumbra and even greater area of relative ischemia when using the strictest T-max parameters. 2. No flow limiting stenosis or embolic source seen in the more proximal neck. 3. 4 mm right PICA aneurysm. The left V4 segment and basilar are fenestrated. Electronically Signed   By: Marnee Spring M.D.   On: 09/30/2019 09:10   CT  ANGIO NECK W OR WO CONTRAST  Result Date: 09/30/2019 CLINICAL DATA:  Facial droop and left-sided weakness EXAM: CT ANGIOGRAPHY HEAD AND NECK CT PERFUSION BRAIN TECHNIQUE: Multidetector CT imaging of the head and neck was performed using the standard protocol during bolus administration of intravenous contrast. Multiplanar CT image reconstructions and MIPs were obtained to evaluate the vascular anatomy. Carotid stenosis measurements (when applicable) are obtained utilizing NASCET criteria, using the distal internal carotid diameter as the denominator. Multiphase CT imaging of the brain was performed following IV bolus contrast injection. Subsequent parametric perfusion maps were calculated using RAPID software. CONTRAST:  OMNIPAQUE IOHEXOL 350 MG/ML SOLN COMPARISON:  Head CT earlier today.  Brain MRI 06/27/2013 FINDINGS: CTA NECK FINDINGS Aortic arch: 2 vessel branching. Right carotid system: Atheromatous wall thickening of the common carotid to the level of the bifurcation. No flow limiting stenosis or ulceration. Negative for beading. Left carotid system: Calcified plaque at the bifurcation that is mild. No stenosis or ulceration. Vertebral arteries: Mild subclavian atherosclerosis on the right, more moderate on the left, without flow limiting stenosis or ulceration. Mild-to-moderate atheromatous narrowing at the left vertebral origin due to calcified plaque. Skeleton: Diffuse degenerative disease of the cervical spine. Other neck: No acute or aggressive finding. Postoperative right thyroid. Upper chest: Biapical pleural based scarring Review of the MIP images confirms the above findings CTA HEAD FINDINGS Anterior circulation: Mild atherosclerotic plaque along the carotid siphons. There is a severe right M2 branch occlusion at the level of the insula with the downstream distal right M3 branch occlusion. Atherosclerotic irregularity of medium size vessels. Negative for aneurysm. Bilateral early branching  MCA. Posterior circulation: Fenestrated left V4 segment and proximal basilar. 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 Symmetric flow in the posterior cerebral arteries. Venous sinuses: Negative Anatomic variants: As above Review of the MIP images confirms the above findings CT Brain Perfusion Findings: ASPECTS: 10 CBF (<30%) Volume: 0mL Perfusion (Tmax>6.0s) volume: 13mL These results were called by telephone at the time of interpretation on 09/30/2019 at 9:01 am to provider Arther Dames , who verbally acknowledged these results. IMPRESSION: 1. 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 This is associated with 13 cc of penumbra and even greater area of relative ischemia when using the strictest T-max parameters. 2. No flow limiting stenosis or embolic source seen in the more proximal neck. 3. 4 mm right PICA aneurysm. The left V4 segment and basilar are fenestrated. Electronically Signed   By: Marnee Spring M.D.   On: 09/30/2019 09:10   MR BRAIN WO CONTRAST  Result Date: 09/30/2019 CLINICAL DATA:  Stroke, follow-up. Additional history  provided: New onset speech difficulty and left-sided weakness, underwent revascularization EXAM: MRI HEAD WITHOUT CONTRAST TECHNIQUE: Multiplanar, multiecho pulse sequences of the brain and surrounding structures were obtained without intravenous contrast. COMPARISON:  Non-contrast CT head and CT angiogram head/neck performed earlier the same day 09/30/2019, brain MRI 06/27/2013 FINDINGS: Brain: Multiple sequences are motion degraded. Most notably, there is moderate motion degradation of the axial T2 weighted sequence and axial T2 FLAIR sequence, as well as severe motion degradation of the axial T1 weighted sequence. There are several small acute infarcts within the right MCA vascular territory as follows. 10 mm acute infarct within the right insular cortex (series 3, image 24). A  subcentimeter acute infarct is also present within the adjacent right subinsular region (series 3, images 25-26). Subcentimeter acute cortical infarct within the superior right temporal lobe (series 3, image 22). Small acute infarct within the lateral right postcentral gyrus (series 30, image 38). No evidence of intracranial mass. No midline shift or extra-axial fluid collection. No chronic intracranial blood products. Moderate chronic small vessel ischemic disease. Redemonstrated T2 hyperintense lesions within the globus pallidus bilaterally consistent with sequela of remote insult. Mild generalized parenchymal atrophy Vascular: Flow voids maintained within the proximal large arterial vessels. Please refer to CTA head/neck performed earlier the same day for description of a 4 mm right PICA aneurysm. Skull and upper cervical spine: No focal marrow lesion. Incompletely assessed upper cervical spondylosis. Sinuses/Orbits: Bilateral lens replacements. No significant paranasal sinus disease or mastoid effusion at the imaged levels. IMPRESSION: Significantly motion degraded examination. There are a few small acute infarcts within the right MCA territory measuring up to 10 mm. These infarcts are located within the right insular cortex, right subinsular region, superior right temporal lobe cortex and cortex of the right postcentral gyrus. Stable generalized parenchymal atrophy and chronic small vessel ischemic disease. Redemonstrated symmetric lesions within the globus pallidus bilaterally. Findings are consistent with sequela of remote insult, possibly carbon monoxide poisoning or other anoxic brain injury. Electronically Signed   By: Jackey Loge DO   On: 09/30/2019 16:45   IR CT Head Ltd  Result Date: 10/02/2019 INDICATION: Acute onset of right gaze deviation, dysarthria and left-sided neglect. Occluded mid M2 segment of the inferior division of the right middle cerebral artery on CT angiogram of the head and neck.  EXAM: 1. EMERGENT LARGE VESSEL OCCLUSION THROMBOLYSIS (anterior CIRCULATION) COMPARISON:  CT angiogram of the head and neck of September 30, 2019. MEDICATIONS: Ancef 2 g IV antibiotic was administered within 1 hour of the procedure. ANESTHESIA/SEDATION: General anesthesia. CONTRAST:  Isovue 300 approximately 50 cc. FLUOROSCOPY TIME:  Fluoroscopy Time: 23 minutes 6 seconds (1055 mGy). COMPLICATIONS: None immediate. TECHNIQUE: Following a full explanation of the procedure along with the potential associated complications, an informed witnessed consent was obtained from patient's daughter. The risks of intracranial hemorrhage of 10%, worsening neurological deficit, ventilator dependency, death and inability to revascularize were all reviewed in detail with the patient's daughter. The patient was then put under general anesthesia by the Department of Anesthesiology at [facility]. The right groin was prepped and draped in the usual sterile fashion. Thereafter using modified Seldinger technique, transfemoral access into the right common femoral artery was obtained without difficulty. Over a 0.035 inch guidewire a 5 French Pinnacle sheath was inserted. Through this, and also over a 0.035 inch guidewire a 5 [place] JB 1 catheter was advanced to the aortic arch region and selectively positioned in the innominate artery and the right common carotid artery.  FINDINGS: The innominate artery angiogram demonstrates the origins of the right subclavian artery and the right common carotid artery to be widely patent. The right vertebral artery origin is grossly patent. The vessel is seen to opacify to the cranial skull base to opacify the basilar artery on the lateral projection. Faint opacification of the anterior-inferior cerebellar arteries is seen. The right common carotid arteriogram demonstrates approximately 40-50% narrowing of the origin of the right external carotid artery. The right internal carotid artery at the bulb  to the cranial skull base demonstrates wide patency. The petrous, cavernous, and the supraclinoid segments are widely patent. A small infundibulum is seen at the origin of the right posterior communicating artery. The right middle cerebral artery demonstrates early bifurcation a developmental variation. Occlusion of the M2 segment of the inferior division of the right middle cerebral artery is noted with a prominent area of hypoperfusion involving the right posterior frontal, and the subcortical frontal parietal regions. PROCEDURE: The diagnostic JB 1 catheter in the right common carotid artery was then exchanged over a 0.035 inch 300 cm Rosen exchange guidewire for an 8 [place] Pinnacle sheath in the right groin. This was then connected to continuous heparinized saline infusion. Over the [employer] guidewire, an 087 Walrus balloon guide catheter which had been prepped with 50% contrast and 50% heparinized saline infusion was advanced and positioned at the orifice of the right internal carotid artery. The guidewire was removed. Good aspiration was obtained from the hub of the Walrus balloon guide catheter. Using biplane roadmap technique, a 132 cm 6 [place] Catalyst guide catheter was then advanced over a 0.035 inch Roadrunner guidewire and positioned in the distal petrous segment of the right internal carotid artery. The guidewire was removed. Good aspiration was obtained from the hub of the Catalyst guide catheter. Control arteriogram was then performed centered over the right MCA distribution. Over a 0.014 inch standard Synchro micro guidewire which had a J configuration, an 021 150 cm Trevo ProVue microcatheter was advanced without difficulty into the supraclinoid right ICA. The micro guidewire was then manipulated into the inferior division and passed the M2 segment occlusion into the distal M2 M3 region. The microcatheter was then advanced over the micro guidewire. The guidewire was removed. Good aspiration  obtained from the hub of the microcatheter. A gentle control arteriogram performed through the microcatheter demonstrated safe position of tip of the microcatheter. This was then connected to continuous heparinized saline infusion. A 3 mm x 33 mm Trevo ProVue retrieval device was then advanced to the distal end of the microcatheter. The O ring on the delivery microcatheter was loosened. With slight forward gentle traction with the right hand on the delivery micro guidewire, with the left hand the distal and then the proximal portion of the retrieval device was then deployed. The 6 [place] Catalyst guide catheter was advanced to the origin of the inferior division of the right middle cerebral artery. With proximal flow arrest in the right internal carotid artery by inflating the balloon of the [facility] guide catheter and aspiration with a 60 mL syringe at the hub of the [facility] guide catheter, and with a Penumbra aspiration device at the hub of the 6 [place] Catalyst guide catheter over about 2 1/2 minutes, the combination was gently retrieved and removed. With constant Penumbra aspiration on the 6 French Catalyst guide catheter, the retrieval device and the microcatheter were retrieved and removed. A piece of clot was seen in the container of the aspiration device. The proximal  flow arrest was reversed. Free aspiration of blood was noted at the hub of the Catalyst guide catheter in the right internal carotid artery. A control arteriogram performed through the 6 [place] Catalyst guide catheter demonstrated complete opacification of the right middle cerebral artery superior and inferior divisions achieving a TICI 3 revascularization. The anterior cerebral artery distribution remained also widely patent. The Catalyst guide catheter was then retrieved and removed. A control arteriogram performed through the [facility] balloon guide catheter in the right common carotid artery demonstrated excellent flow through the extracranial  and intracranial right ICA, the right middle cerebral artery and the right anterior cerebral artery. Distribution continued to demonstrate free flow without evidence of intraluminal filling defects or of occlusions. No evidence of extravasation or mass-effect was seen. The Walrus balloon guide catheter was removed. The 8 French Pinnacle sheath in the right groin was then removed with successful hemostasis achieving an 8 [place] Angio-Seal closure device. The right groin appeared soft without evidence of hematoma or bleeding. Distal pulses remained Dopplerable in the dorsalis pedis, and the posterior tibial regions bilaterally unchanged. A flat panel CT of the brain demonstrated no evidence of hemorrhage, mass effect or midline shift. The patient was then extubated without difficulty. Upon recovery, the patient was able to move all four extremities, and obey simple commands. She was transferred to the neuro ICU for post thrombectomy management. IMPRESSION: Status post endovascular complete revascularization of the M2 segment of the inferior division of the right middle cerebral artery with 1 pass with the 3 mm x 33 mm Trevo ProVue retrieval device, and Penumbra aspiration achieving a TICI 3 revascularization. PLAN: Follow-up as per referring MD. Electronically Signed   By: Julieanne Cotton M.D.   On: 10/01/2019 14:04   CT CEREBRAL PERFUSION W CONTRAST  Result Date: 09/30/2019 CLINICAL DATA:  Facial droop and left-sided weakness EXAM: CT ANGIOGRAPHY HEAD AND NECK CT PERFUSION BRAIN TECHNIQUE: Multidetector CT imaging of the head and neck was performed using the standard protocol during bolus administration of intravenous contrast. Multiplanar CT image reconstructions and MIPs were obtained to evaluate the vascular anatomy. Carotid stenosis measurements (when applicable) are obtained utilizing NASCET criteria, using the distal internal carotid diameter as the denominator. Multiphase CT imaging of the brain was  performed following IV bolus contrast injection. Subsequent parametric perfusion maps were calculated using RAPID software. CONTRAST:  OMNIPAQUE IOHEXOL 350 MG/ML SOLN COMPARISON:  Head CT earlier today.  Brain MRI 06/27/2013 FINDINGS: CTA NECK FINDINGS Aortic arch: 2 vessel branching. Right carotid system: Atheromatous wall thickening of the common carotid to the level of the bifurcation. No flow limiting stenosis or ulceration. Negative for beading. Left carotid system: Calcified plaque at the bifurcation that is mild. No stenosis or ulceration. Vertebral arteries: Mild subclavian atherosclerosis on the right, more moderate on the left, without flow limiting stenosis or ulceration. Mild-to-moderate atheromatous narrowing at the left vertebral origin due to calcified plaque. Skeleton: Diffuse degenerative disease of the cervical spine. Other neck: No acute or aggressive finding. Postoperative right thyroid. Upper chest: Biapical pleural based scarring Review of the MIP images confirms the above findings CTA HEAD FINDINGS Anterior circulation: Mild atherosclerotic plaque along the carotid siphons. There is a severe right M2 branch occlusion at the level of the insula with the downstream distal right M3 branch occlusion. Atherosclerotic irregularity of medium size vessels. Negative for aneurysm. Bilateral early branching MCA. Posterior circulation: Fenestrated left V4 segment and proximal basilar. There is a superiorly directed right PICA aneurysm which measures  4 mm base to dome on coronal reformats. Symmetric flow in the posterior cerebral arteries. Venous sinuses: Negative Anatomic variants: As above Review of the MIP images confirms the above findings CT Brain Perfusion Findings: ASPECTS: 10 CBF (<30%) Volume: 28mL Perfusion (Tmax>6.0s) volume: 60mL These results were called by telephone at the time of interpretation on 09/30/2019 at 9:01 am to provider Samara Snide , who verbally acknowledged these  results. IMPRESSION: 1. 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 This is associated with 13 cc of penumbra and even greater area of relative ischemia when using the strictest T-max parameters. 2. No flow limiting stenosis or embolic source seen in the more proximal neck. 3. 4 mm right PICA aneurysm. The left V4 segment and basilar are fenestrated. Electronically Signed   By: Monte Fantasia M.D.   On: 09/30/2019 09:10   DG Chest Portable 1 View  Result Date: 09/30/2019 CLINICAL DATA:  Stroke, altered mental status, weakness, hypertension. EXAM: PORTABLE CHEST 1 VIEW COMPARISON:  CT angiogram chest/abdomen/pelvis 02/22/2018 FINDINGS: Heart size within normal limits.  Aortic atherosclerosis. There is no airspace consolidation within the lungs. No evidence of pleural effusion or pneumothorax. No acute bony abnormality. Degenerative change of the thoracic spine. Surgical clips project over the lower neck. Overlying cardiac monitoring leads. IMPRESSION: No airspace consolidation. Aortic atherosclerosis. Electronically Signed   By: Kellie Simmering DO   On: 09/30/2019 13:15   ECHOCARDIOGRAM COMPLETE  Result Date: 09/30/2019   ECHOCARDIOGRAM REPORT   Patient Name:   MIKAL WISMAN Date of Exam: 09/30/2019 Medical Rec #:  [id number]       Height:       64.0 in Accession #:    [id number]      Weight:       149.0 lb Date of Birth:  07/28/1932       BSA:          1.73 m Patient Age:    21 years        BP:           155/94 mmHg Patient Gender: F               HR:           77 bpm. Exam Location:  Inpatient Procedure: 2D Echo, Cardiac Doppler and Color Doppler Indications:    Stroke.  History:        Patient has prior history of Echocardiogram examinations, most                 recent 10/31/2005. Stroke, Signs/Symptoms:Syncope and                 Dizziness/Lightheadedness; Risk Factors:Dyslipidemia and                 Hypertension.  Sonographer:    Roseanna Rainbow RDCS Referring Phys: [id number] Todd  1. Left ventricular ejection fraction, by visual estimation, is 65 to 70%. The left ventricle has normal function. There is moderately increased left ventricular hypertrophy.  2. Small left ventricular internal cavity size.  3. The left ventricle has no regional wall motion abnormalities.  4. Global right ventricle has normal systolic function.The right ventricular size is normal. No increase in right ventricular wall thickness.  5. Left atrial size was moderately dilated.  6. Right atrial size was normal.  7. The mitral valve is normal in structure. Mild mitral valve regurgitation.  8. The tricuspid valve is normal in structure. Tricuspid valve  regurgitation is mild.  9. The aortic valve is tricuspid. Aortic valve regurgitation is not visualized. Mild to moderate aortic valve sclerosis/calcification without any evidence of aortic stenosis. 10. Moderate calcifications of AV leaflets. No stenosis. 11. The pulmonic valve was not well visualized. Pulmonic valve regurgitation is not visualized. 12. Mildly elevated pulmonary artery systolic pressure. FINDINGS  Left Ventricle: Left ventricular ejection fraction, by visual estimation, is 65 to 70%. The left ventricle has normal function. The left ventricle has no regional wall motion abnormalities. The left ventricular internal cavity size was the LV cavity size is small. There is moderately increased left ventricular hypertrophy. Right Ventricle: The right ventricular size is normal. No increase in right ventricular wall thickness. Global RV systolic function is has normal systolic function. The tricuspid regurgitant velocity is 2.58 m/s, and with an assumed right atrial pressure  of 8 mmHg, the estimated right ventricular systolic pressure is mildly elevated at 34.5 mmHg. Left Atrium: Left atrial size was moderately dilated. Right Atrium: Right atrial size was normal in size Pericardium: There is no evidence of pericardial effusion. Mitral Valve: The  mitral valve is normal in structure. Mild mitral valve regurgitation. Tricuspid Valve: The tricuspid valve is normal in structure. Tricuspid valve regurgitation is mild. Aortic Valve: The aortic valve is tricuspid. Aortic valve regurgitation is not visualized. Mild to moderate aortic valve sclerosis/calcification is present, without any evidence of aortic stenosis. Moderate calcifications of AV leaflets. No stenosis. Pulmonic Valve: The pulmonic valve was not well visualized. Pulmonic valve regurgitation is not visualized. Pulmonic regurgitation is not visualized. Aorta: The aortic root is normal in size and structure. IAS/Shunts: No atrial level shunt detected by color flow Doppler.  LEFT VENTRICLE PLAX 2D LVIDd:         3.61 cm       Diastology LVIDs:         2.27 cm       LV e' lateral:   6.96 cm/s LV PW:         1.21 cm       LV E/e' lateral: 15.1 LV IVS:        1.67 cm       LV e' medial:    5.98 cm/s LVOT diam:     1.90 cm       LV E/e' medial:  17.6 LV SV:         37 ml LV SV Index:   21.17 LVOT Area:     2.84 cm  LV Volumes (MOD) LV area d, A2C:    23.30 cm LV area d, A4C:    18.20 cm LV area s, A2C:    10.30 cm LV area s, A4C:    7.63 cm LV major d, A2C:   7.16 cm LV major d, A4C:   6.07 cm LV major s, A2C:   5.37 cm LV major s, A4C:   5.27 cm LV vol d, MOD A2C: 63.3 ml LV vol d, MOD A4C: 44.1 ml LV vol s, MOD A2C: 16.9 ml LV vol s, MOD A4C: 9.3 ml LV SV MOD A2C:     46.4 ml LV SV MOD A4C:     44.1 ml LV SV MOD BP:      44.3 ml RIGHT VENTRICLE             IVC RV S prime:     15.40 cm/s  IVC diam: 2.25 cm TAPSE (M-mode): 2.3 cm LEFT ATRIUM  Index       RIGHT ATRIUM           Index LA diam:        3.90 cm 2.26 cm/m  RA Area:     10.50 cm LA Vol (A2C):   81.7 ml 47.33 ml/m RA Volume:   19.90 ml  11.53 ml/m LA Vol (A4C):   62.3 ml 36.09 ml/m LA Biplane Vol: 73.8 ml 42.75 ml/m  AORTIC VALVE LVOT Vmax:   121.00 cm/s LVOT Vmean:  80.500 cm/s LVOT VTI:    0.262 m  AORTA Ao Root diam: 3.30 cm  Ao Asc diam:  3.40 cm MITRAL VALVE                         TRICUSPID VALVE MV Area (PHT): 2.76 cm              TR Peak grad:   26.5 mmHg MV PHT:        79.75 msec            TR Vmax:        287.00 cm/s MV Decel Time: 275 msec MV E velocity: 105.00 cm/s 103 cm/s  SHUNTS MV A velocity: 79.50 cm/s  70.3 cm/s Systemic VTI:  0.26 m MV E/A ratio:  1.32        1.5       Systemic Diam: 1.90 cm  Dietrich PatesPaula Ross MD Electronically signed by Dietrich PatesPaula Ross MD Signature Date/Time: 09/30/2019/3:25:43 PM    Final    IR PERCUTANEOUS ART THROMBECTOMY/INFUSION INTRACRANIAL INC DIAG ANGIO  Result Date: 10/02/2019 INDICATION: Acute onset of right gaze deviation, dysarthria and left-sided neglect. Occluded mid M2 segment of the inferior division of the right middle cerebral artery on CT angiogram of the head and neck. EXAM: 1. EMERGENT LARGE VESSEL OCCLUSION THROMBOLYSIS (anterior CIRCULATION) COMPARISON:  CT angiogram of the head and neck of September 30, 2019. MEDICATIONS: Ancef 2 g IV antibiotic was administered within 1 hour of the procedure. ANESTHESIA/SEDATION: General anesthesia. CONTRAST:  Isovue 300 approximately 50 cc. FLUOROSCOPY TIME:  Fluoroscopy Time: 23 minutes 6 seconds (1055 mGy). COMPLICATIONS: None immediate. TECHNIQUE: Following a full explanation of the procedure along with the potential associated complications, an informed witnessed consent was obtained from patient's daughter. The risks of intracranial hemorrhage of 10%, worsening neurological deficit, ventilator dependency, death and inability to revascularize were all reviewed in detail with the patient's daughter. The patient was then put under general anesthesia by [facility]. The right groin was prepped and draped in the usual sterile fashion. Thereafter using modified Seldinger technique, transfemoral access into the right common femoral artery was obtained without difficulty. Over a 0.035 inch guidewire a 5 French  Pinnacle sheath was inserted. Through this, and also over a 0.035 inch guidewire a 5 [place] JB 1 catheter was advanced to the aortic arch region and selectively positioned in the innominate artery and the right common carotid artery. FINDINGS: The innominate artery angiogram demonstrates the origins of the right subclavian artery and the right common carotid artery to be widely patent. The right vertebral artery origin is grossly patent. The vessel is seen to opacify to the cranial skull base to opacify the basilar artery on the lateral projection. Faint opacification of the anterior-inferior cerebellar arteries is seen. The right common carotid arteriogram demonstrates approximately 40-50% narrowing of the origin of the right external carotid artery. The right internal carotid artery at  the bulb to the cranial skull base demonstrates wide patency. The petrous, cavernous, and the supraclinoid segments are widely patent. A small infundibulum is seen at the origin of the right posterior communicating artery. The right middle cerebral artery demonstrates early bifurcation a developmental variation. Occlusion of the M2 segment of the inferior division of the right middle cerebral artery is noted with a prominent area of hypoperfusion involving the right posterior frontal, and the subcortical frontal parietal regions. PROCEDURE: The diagnostic JB 1 catheter in the right common carotid artery was then exchanged over a 0.035 inch 300 cm Rosen exchange guidewire for an 8 [place] Pinnacle sheath in the right groin. This was then connected to continuous heparinized saline infusion. Over the [employer] guidewire, an 087 Walrus balloon guide catheter which had been prepped with 50% contrast and 50% heparinized saline infusion was advanced and positioned at the orifice of the right internal carotid artery. The guidewire was removed. Good aspiration was obtained from the hub of the Walrus balloon guide catheter. Using biplane  roadmap technique, a 132 cm 6 [place] Catalyst guide catheter was then advanced over a 0.035 inch Roadrunner guidewire and positioned in the distal petrous segment of the right internal carotid artery. The guidewire was removed. Good aspiration was obtained from the hub of the Catalyst guide catheter. Control arteriogram was then performed centered over the right MCA distribution. Over a 0.014 inch standard Synchro micro guidewire which had a J configuration, an 021 150 cm Trevo ProVue microcatheter was advanced without difficulty into the supraclinoid right ICA. The micro guidewire was then manipulated into the inferior division and passed the M2 segment occlusion into the distal M2 M3 region. The microcatheter was then advanced over the micro guidewire. The guidewire was removed. Good aspiration obtained from the hub of the microcatheter. A gentle control arteriogram performed through the microcatheter demonstrated safe position of tip of the microcatheter. This was then connected to continuous heparinized saline infusion. A 3 mm x 33 mm Trevo ProVue retrieval device was then advanced to the distal end of the microcatheter. The O ring on the delivery microcatheter was loosened. With slight forward gentle traction with the right hand on the delivery micro guidewire, with the left hand the distal and then the proximal portion of the retrieval device was then deployed. The 6 [place] Catalyst guide catheter was advanced to the origin of the inferior division of the right middle cerebral artery. With proximal flow arrest in the right internal carotid artery by inflating the balloon of the [facility] guide catheter and aspiration with a 60 mL syringe at the hub of the [facility] guide catheter, and with a Penumbra aspiration device at the hub of the 6 [place] Catalyst guide catheter over about 2 1/2 minutes, the combination was gently retrieved and removed. With constant Penumbra aspiration on the 6 French Catalyst guide catheter,  the retrieval device and the microcatheter were retrieved and removed. A piece of clot was seen in the container of the aspiration device. The proximal flow arrest was reversed. Free aspiration of blood was noted at the hub of the Catalyst guide catheter in the right internal carotid artery. A control arteriogram performed through the 6 [place] Catalyst guide catheter demonstrated complete opacification of the right middle cerebral artery superior and inferior divisions achieving a TICI 3 revascularization. The anterior cerebral artery distribution remained also widely patent. The Catalyst guide catheter was then retrieved and removed. A control arteriogram performed through the [facility] balloon guide catheter in the right  common carotid artery demonstrated excellent flow through the extracranial and intracranial right ICA, the right middle cerebral artery and the right anterior cerebral artery. Distribution continued to demonstrate free flow without evidence of intraluminal filling defects or of occlusions. No evidence of extravasation or mass-effect was seen. The Walrus balloon guide catheter was removed. The 8 French Pinnacle sheath in the right groin was then removed with successful hemostasis achieving an 8 [place] Angio-Seal closure device. The right groin appeared soft without evidence of hematoma or bleeding. Distal pulses remained Dopplerable in the dorsalis pedis, and the posterior tibial regions bilaterally unchanged. A flat panel CT of the brain demonstrated no evidence of hemorrhage, mass effect or midline shift. The patient was then extubated without difficulty. Upon recovery, the patient was able to move all four extremities, and obey simple commands. She was transferred to the neuro ICU for post thrombectomy management. IMPRESSION: Status post endovascular complete revascularization of the M2 segment of the inferior division of the right middle cerebral artery with 1 pass with the 3 mm x 33 mm Trevo  ProVue retrieval device, and Penumbra aspiration achieving a TICI 3 revascularization. PLAN: Follow-up as per referring MD. Electronically Signed   By: Julieanne CottonSanjeev  Deveshwar M.D.   On: 10/01/2019 14:04   CT HEAD CODE STROKE WO CONTRAST  Result Date: 09/30/2019 CLINICAL DATA:  Code stroke. Focal neuro deficit, greater than 6 hours, stroke suspected. Additional history provided: Facial droop, left-sided weakness, history of brain aneurysm. EXAM: CT HEAD WITHOUT CONTRAST TECHNIQUE: Contiguous axial images were obtained from the base of the skull through the vertex without intravenous contrast. COMPARISON:  MRI/MRA head 06/27/2013, head CT 04/10/2013 FINDINGS: Brain: No evidence of acute intracranial hemorrhage. No demarcated cortical infarction. No evidence of intracranial mass. No midline shift or extra-axial fluid collection. Moderate patchy hypodensity within the cerebral white matter is nonspecific, but consistent with chronic small vessel ischemic disease. Redemonstrated chronic lacunar infarct within the inferior left basal ganglia. 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 Mild generalized parenchymal atrophy. Vascular: No hyperdense vessel or unexpected calcification. Skull: Normal. Negative for fracture or focal lesion. Sinuses/Orbits: Rightward gaze deviation. No significant paranasal sinus disease or mastoid effusion at the imaged levels. These results were communicated to Dr. Laurence SlateAroor at 8:35 amon 12/14/2020by text page via the [facility] messaging system. IMPRESSION: No CT evidence of acute intracranial abnormality. Stable generalized parenchymal atrophy and chronic small vessel ischemic disease. Redemonstrated chronic lacunar infarct within the inferior left basal ganglia. 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 Electronically Signed   By: Jackey LogeKyle  Golden DO   On: 09/30/2019 08:37   VAS US  LOWER EXTREMITY VENOUS (DVT)  Result Date: 10/01/2019  Lower Venous Study Indications: Embolic stroke.  Risk Factors: Surgery 09/30/2019 - IR WITH ANESTHESIA. Limitations: Poor ultrasound/tissue interface and bandages. Comparison Study: No prior studies. Performing Technologist: Chanda BusingGregory Collins RVT  Examination Guidelines: A complete evaluation includes B-mode imaging, spectral Doppler, color Doppler, and power Doppler as needed of all accessible portions of each vessel. Bilateral testing is considered an integral part of a complete examination. Limited examinations for reoccurring indications may be performed as noted.  +---------+---------------+---------+-----------+----------+--------------+ RIGHT    CompressibilityPhasicitySpontaneityPropertiesThrombus Aging +---------+---------------+---------+-----------+----------+--------------+ CFV      Full           Yes      Yes                                 +---------+---------------+---------+-----------+----------+--------------+  SFJ      Full                                                        +---------+---------------+---------+-----------+----------+--------------+ FV Prox  Full                                                        +---------+---------------+---------+-----------+----------+--------------+ FV Mid   Full                                                        +---------+---------------+---------+-----------+----------+--------------+ FV DistalFull                                                        +---------+---------------+---------+-----------+----------+--------------+ PFV      Full                                                        +---------+---------------+---------+-----------+----------+--------------+ POP      Full           Yes      Yes                                 +---------+---------------+---------+-----------+----------+--------------+ PTV      Full                                                         +---------+---------------+---------+-----------+----------+--------------+ PERO     Full                                                        +---------+---------------+---------+-----------+----------+--------------+   +---------+---------------+---------+-----------+----------+--------------+ LEFT     CompressibilityPhasicitySpontaneityPropertiesThrombus Aging +---------+---------------+---------+-----------+----------+--------------+ CFV      Full           Yes      Yes                                 +---------+---------------+---------+-----------+----------+--------------+ SFJ      Full                                                        +---------+---------------+---------+-----------+----------+--------------+  FV Prox  Full                                                        +---------+---------------+---------+-----------+----------+--------------+ FV Mid   Full                                                        +---------+---------------+---------+-----------+----------+--------------+ FV DistalFull                                                        +---------+---------------+---------+-----------+----------+--------------+ PFV      Full                                                        +---------+---------------+---------+-----------+----------+--------------+ POP      Full           Yes      Yes                                 +---------+---------------+---------+-----------+----------+--------------+ PTV      Full                                                        +---------+---------------+---------+-----------+----------+--------------+ PERO     Full                                                        +---------+---------------+---------+-----------+----------+--------------+     Summary: Right: There is no evidence of deep vein thrombosis in the  lower extremity. However, portions of this examination were limited- see technologist comments above. No cystic structure found in the popliteal fossa. Left: There is no evidence of deep vein thrombosis in the lower extremity. No cystic structure found in the popliteal fossa.  *See table(s) above for measurements and observations. Electronically signed by Waverly Ferrari MD on 10/01/2019 at 2:40:52 PM.    Final    IR ANGIO VERTEBRAL SEL SUBCLAVIAN INNOMINATE UNI R MOD SED  Result Date: 10/03/2019 INDICATION: Acute onset of right gaze deviation, dysarthria and left-sided neglect. Occluded mid M2 segment of the inferior division of the right middle cerebral artery on CT angiogram of the head and neck.  EXAM: 1. EMERGENT LARGE VESSEL OCCLUSION THROMBOLYSIS (anterior CIRCULATION)  COMPARISON:  CT angiogram of the head and neck of September 30, 2019.  MEDICATIONS: Ancef 2 g IV antibiotic was administered within 1 hour of the procedure.  ANESTHESIA/SEDATION: General anesthesia.  CONTRAST:  Isovue 300 approximately 50 cc.  FLUOROSCOPY TIME:  Fluoroscopy Time: 23 minutes 6 seconds (1055 mGy).  COMPLICATIONS: None immediate.  TECHNIQUE: Following a full explanation of the procedure along with the potential associated complications, an informed witnessed consent was obtained from patient's daughter. The risks of intracranial hemorrhage of 10%, worsening neurological deficit, ventilator dependency, death and inability to revascularize were all reviewed in detail with the patient's daughter.  The patient was then put under general anesthesia by the Department of Anesthesiology at [facility].  The right groin was prepped and draped in the usual sterile fashion. Thereafter using modified Seldinger technique, transfemoral access into the right common femoral artery was obtained without difficulty. Over a 0.035 inch guidewire a 5 French Pinnacle sheath was inserted. Through this, and also over a 0.035  inch guidewire a 5 [place] JB 1 catheter was advanced to the aortic arch region and selectively positioned in the innominate artery and the right common carotid artery.  FINDINGS: The innominate artery angiogram demonstrates the origins of the right subclavian artery and the right common carotid artery to be widely patent.  The right vertebral artery origin is grossly patent. The vessel is seen to opacify to the cranial skull base to opacify the basilar artery on the lateral projection. Faint opacification of the anterior-inferior cerebellar arteries is seen.  The right common carotid arteriogram demonstrates approximately 40-50% narrowing of the origin of the right external carotid artery.  The right internal carotid artery at the bulb to the cranial skull base demonstrates wide patency. The petrous, cavernous, and the supraclinoid segments are widely patent.  A small infundibulum is seen at the origin of the right posterior communicating artery.  The right middle cerebral artery demonstrates early bifurcation a developmental variation.  Occlusion of the M2 segment of the inferior division of the right middle cerebral artery is noted with a prominent area of hypoperfusion involving the right posterior frontal, and the subcortical frontal parietal regions.  PROCEDURE: The diagnostic JB 1 catheter in the right common carotid artery was then exchanged over a 0.035 inch 300 cm Rosen exchange guidewire for an 8 [place] Pinnacle sheath in the right groin. This was then connected to continuous heparinized saline infusion. Over the [employer] guidewire, an 087 Walrus balloon guide catheter which had been prepped with 50% contrast and 50% heparinized saline infusion was advanced and positioned at the orifice of the right internal carotid artery.  The guidewire was removed. Good aspiration was obtained from the hub of the Walrus balloon guide catheter. Using biplane roadmap technique, a 132 cm 6 [place] Catalyst  guide catheter was then advanced over a 0.035 inch Roadrunner guidewire and positioned in the distal petrous segment of the right internal carotid artery. The guidewire was removed. Good aspiration was obtained from the hub of the Catalyst guide catheter. Control arteriogram was then performed centered over the right MCA distribution.  Over a 0.014 inch standard Synchro micro guidewire which had a J configuration, an 021 150 cm Trevo ProVue microcatheter was advanced without difficulty into the supraclinoid right ICA.  The micro guidewire was then manipulated into the inferior division and passed the M2 segment occlusion into the distal M2 M3 region. The microcatheter was then advanced over the micro guidewire. The guidewire was removed. Good aspiration obtained from the hub of the microcatheter. A gentle control arteriogram performed through the microcatheter demonstrated safe position of tip of the microcatheter. This was then connected to continuous heparinized saline infusion.  A 3 mm x 33 mm Trevo ProVue retrieval device was then advanced to the distal end of the microcatheter.  The O ring on the delivery microcatheter was loosened. With slight forward gentle traction with the right hand on the delivery micro guidewire, with the left hand the distal and then the proximal portion of the retrieval device was then deployed. The 6 [place] Catalyst guide catheter was advanced to the origin of the inferior division of the right middle cerebral artery.  With proximal flow arrest in the right internal carotid artery by inflating the balloon of the [facility] guide catheter and aspiration with a 60 mL syringe at the hub of the [facility] guide catheter, and with a Penumbra aspiration device at the hub of the 6 [place] Catalyst guide catheter over about 2 1/2 minutes, the combination was gently retrieved and removed. With constant Penumbra aspiration on the 6 French Catalyst guide catheter, the retrieval device and the  microcatheter were retrieved and removed. A piece of clot was seen in the container of the aspiration device.  The proximal flow arrest was reversed. Free aspiration of blood was noted at the hub of the Catalyst guide catheter in the right internal carotid artery. A control arteriogram performed through the 6 [place] Catalyst guide catheter demonstrated complete opacification of the right middle cerebral artery superior and inferior divisions achieving a TICI 3 revascularization. The anterior cerebral artery distribution remained also widely patent.  The Catalyst guide catheter was then retrieved and removed. A control arteriogram performed through the [facility] balloon guide catheter in the right common carotid artery demonstrated excellent flow through the extracranial and intracranial right ICA, the right middle cerebral artery and the right anterior cerebral artery. Distribution continued to demonstrate free flow without evidence of intraluminal filling defects or of occlusions.  No evidence of extravasation or mass-effect was seen.  The [place] balloon guide catheter was removed. The 8 French Pinnacle sheath in the right groin was then removed with successful hemostasis achieving an 8 [place] Angio-Seal closure device.  The right groin appeared soft without evidence of hematoma or bleeding. Distal pulses remained Dopplerable in the dorsalis pedis, and the posterior tibial regions bilaterally unchanged.  A flat panel CT of the brain demonstrated no evidence of hemorrhage, mass effect or midline shift.  The patient was then extubated without difficulty. Upon recovery, the patient was able to move all four extremities, and obey simple commands. She was transferred to the neuro ICU for post thrombectomy management.  IMPRESSION: Status post endovascular complete revascularization of the M2 segment of the inferior division of the right middle cerebral artery with 1 pass with the 3 mm x 33 mm Trevo ProVue retrieval  device, and Penumbra aspiration achieving a TICI 3 revascularization.  PLAN: Follow-up as per referring MD.   Electronically Signed   By: Julieanne Cotton M.D.   On: 10/01/2019 14:04    Assessment & Plan:  Plan  I have changed Blase Mess "[name]"'s potassium chloride SA. I am also having her maintain her diclofenac sodium, acetaminophen, atorvastatin, apixaban, hydrocortisone, hydrocortisone, levothyroxine, furosemide, and amLODipine.  Meds ordered this encounter  Medications  . potassium chloride SA (KLOR-CON) 20 MEQ tablet    Sig: Take 1 tablet (20 mEq total) by mouth daily.    Dispense:  90 tablet    Refill:  1  . levothyroxine (SYNTHROID) 75 MCG tablet    Sig: TAKE 1 TABLET(75 MCG) BY MOUTH DAILY    Dispense:  90 tablet    Refill:  3  . furosemide (LASIX) 40 MG tablet    Sig: Take 1 tablet (40 mg total) by mouth daily.    Dispense:  90 tablet    Refill:  3  . amLODipine (NORVASC) 2.5 MG tablet    Sig: TAKE 1 TABLET BY MOUTH ONCE DAILY    Dispense:  90 tablet    Refill:  1    Problem List Items Addressed This Visit      Unprioritized   Atrial fibrillation (HCC)    On eliquis No new symptoms      Relevant Medications   furosemide (LASIX) 40 MG tablet   amLODipine (NORVASC) 2.5 MG tablet   Essential hypertension    Well controlled, no changes to meds. Encouraged heart healthy diet such as the DASH diet and exercise as tolerated.       Relevant Medications   furosemide (LASIX) 40 MG tablet   amLODipine (NORVASC) 2.5 MG tablet   Hyperlipidemia    Encouraged heart healthy diet, increase exercise, avoid trans fats, consider a krill oil cap daily      Relevant Medications   furosemide (LASIX) 40 MG tablet   amLODipine (NORVASC) 2.5 MG tablet   Hyperlipidemia associated with type 2 diabetes mellitus (HCC)    Encouraged heart healthy diet, increase exercise, avoid trans fats, consider a krill oil cap daily      Hypothyroidism    Lab Results  Component  Value Date   TSH 0.62 10/08/2019         Relevant Medications   levothyroxine (SYNTHROID) 75 MCG tablet   Right leg swelling   Relevant Medications   furosemide (LASIX) 40 MG tablet   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    Per neuro      Relevant Medications   furosemide (LASIX) 40 MG tablet   amLODipine (NORVASC) 2.5 MG tablet    Other Visit Diagnoses    Low serum potassium level       Relevant Medications   potassium chloride SA (KLOR-CON) 20 MEQ tablet      Follow-up: Return in about 6 months (around 04/19/2020), or if symptoms worsen or fail to improve, for annual exam, fasting.  [name], DO

## 2019-10-21 NOTE — Assessment & Plan Note (Signed)
Per neuro 

## 2019-10-30 ENCOUNTER — Telehealth: Payer: Self-pay | Admitting: Nurse Practitioner

## 2019-10-30 NOTE — Telephone Encounter (Signed)
Pt stated that she has not heard from [facility] regarding referral.

## 2019-10-31 NOTE — Telephone Encounter (Signed)
Spoke with [facility] yesterday. They stated they would follow up today.  I spoke with patient today and she states that they have contacted her and made an appointment.

## 2019-11-04 ENCOUNTER — Other Ambulatory Visit: Payer: Self-pay

## 2019-11-04 ENCOUNTER — Ambulatory Visit (HOSPITAL_COMMUNITY)
Admission: RE | Admit: 2019-11-04 | Discharge: 2019-11-04 | Disposition: A | Discharge status: Home / Self Care | Payer: Medicare Other | Source: Ambulatory Visit | Attending: Student | Admitting: Student

## 2019-11-04 DIAGNOSIS — I6601 Occlusion and stenosis of right middle cerebral artery: Secondary | ICD-10-CM

## 2019-11-04 NOTE — Progress Notes (Signed)
Chief Complaint: Patient was seen in consultation today for right PICA aneurysm.  Referring Physician(s): Aroor, Georgiana Spinner R  Supervising Physician: Julieanne Cotton  Patient Status: [facility] - Out-pt  History of Present Illness: Belinda Anderson is a 86 y.o. female with a past medical history as below, with pertinent past medical history including hypertension, hyperlipidemia, CVA 09/2019, atrial fibrillation on long term anticoagulation with Eliquis, thyroid disease, and arthritis. She is known to [facility] and has been followed by Dr. Corliss Skains since 09/2019. She first presented to our department as an active Code Stroke at the request of Dr. Laurence Slate. She underwent an image-guided cerebral arteriogram with emergent 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 Corliss Skains. Of note, patient was also found to have an incidental finding of a right PICA aneurysm during this procedure. She was discharged home 10/02/2019 in stable condition.  Patient presents today to discuss management options of right PICA aneurysm. Patient awake and alert sitting in chair. Accompanied by daughter. Complains of intermittent dizziness when looking up. States that she avoids looking up so that this does not happen. Denies headache, weakness, numbness/tingling, vision changes, hearing changes, tinnitus, or speech difficulty.  Currently taking Eliquis 2.5 mg twice daily.   Past Medical History:  Diagnosis Date  . Arthritis   . Atrial fibrillation (HCC)   . Hypertension   . Thyroid disease   . TIA (transient ischemic attack)     Past Surgical History:  Procedure Laterality Date  . EYE SURGERY     lens implant  . IR ANGIO VERTEBRAL SEL SUBCLAVIAN INNOMINATE UNI R MOD SED  09/30/2019  . IR CT HEAD LTD  09/30/2019  . IR PERCUTANEOUS ART THROMBECTOMY/INFUSION INTRACRANIAL INC DIAG ANGIO  09/30/2019  . RADIOLOGY WITH ANESTHESIA N/A 09/30/2019   Procedure: IR WITH ANESTHESIA;  Surgeon: Julieanne Cotton, MD;  Location: [facility] OR;  Service: Radiology;  Laterality: N/A;    Allergies: Patient has no known allergies.  Medications: Prior to Admission medications   Medication Sig Start Date End Date Taking? Authorizing Provider  acetaminophen (TYLENOL) 650 MG CR tablet Take 1,300 mg by mouth 3 (three) times daily.    [provider]  amLODipine (NORVASC) 2.5 MG tablet TAKE 1 TABLET BY MOUTH ONCE DAILY 10/21/19   [name], Grayling Congress, DO  apixaban (ELIQUIS) 2.5 MG TABS tablet Take 1 tablet (2.5 mg total) by mouth 2 (two) times daily. 10/02/19   Layne Benton, NP  atorvastatin (LIPITOR) 40 MG tablet Take 1 tablet (40 mg total) by mouth daily at 6 PM. 10/02/19   Layne Benton, NP  diclofenac sodium (VOLTAREN) 1 % GEL Apply 4 g topically 4 (four) times daily. Patient taking differently: Apply 4 g topically 4 (four) times daily as needed (joint pain).  05/10/19   [name], DO  furosemide (LASIX) 40 MG tablet Take 1 tablet (40 mg total) by mouth daily. 10/21/19   [name], DO  hydrocortisone (ANUSOL-HC) 2.5 % rectal cream Place 1 application rectally 2 (two) times daily. 10/08/19   [name], DO  hydrocortisone (ANUSOL-HC) 25 MG suppository Place 1 suppository (25 mg total) rectally 2 (two) times daily. 10/08/19   Seabron Spates R, DO  levothyroxine (SYNTHROID) 75 MCG tablet TAKE 1 TABLET(75 MCG) BY MOUTH DAILY 10/21/19   [name], Grayling Congress, DO  potassium chloride SA (KLOR-CON) 20 MEQ tablet Take 1 tablet (20 mEq total) by mouth daily. 10/21/19  [name], DO     Family History  Problem Relation Age of Onset  . Arthritis Mother   . Heart disease Mother   . Heart disease Father 95       MI  . Coronary artery disease Other   . Diabetes Brother        DI    Social History   Socioeconomic History  . Marital status: Widowed    Spouse name: Not on file  . Number of children: 3    . Years of education: Not on file  . Highest education level: Not on file  Occupational History  . Not on file  Tobacco Use  . Smoking status: Never Smoker  . Smokeless tobacco: Never Used  Substance and Sexual Activity  . Alcohol use: No  . Drug use: No  . Sexual activity: Not Currently    Partners: Male  Other Topics Concern  . 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 Difficulty of Paying Living Expenses: Not on file  Food Insecurity:   . Worried About Programme researcher, broadcasting/film/video in the Last Year: Not on file  . Ran Out of Food in the Last Year: Not on file  Transportation Needs: No Transportation Needs  . Lack of Transportation (Medical): No  . Lack of Transportation (Non-Medical): No  Physical Activity:   . Days of Exercise per Week: Not on file  . Minutes of Exercise per Session: Not on file  Stress:   . Feeling of Stress : Not on file  Social Connections:   . Frequency of Communication with Friends and Family: Not on file  . Frequency of Social Gatherings with Friends and Family: Not on file  . Attends Religious Services: Not on file  . Active Member of Clubs or Organizations: Not on file  . Attends Banker Meetings: Not on file  . Marital Status: Not on file     Review of Systems: A 12 point ROS discussed and pertinent positives are indicated in the HPI above.  All other systems are negative.  Review of Systems  Constitutional: Negative for chills and fever.  HENT: Negative for hearing loss and tinnitus.   Eyes: Negative for visual disturbance.  Respiratory: Negative for shortness of breath and wheezing.   Cardiovascular: Negative for chest pain and palpitations.  Neurological: Positive for dizziness. Negative for speech difficulty, weakness, numbness and headaches.  Psychiatric/Behavioral: Negative for behavioral problems  and confusion.    Vital Signs: There were no vitals taken for this visit.  Physical Exam Constitutional:      General: She is not in acute distress.    Appearance: Normal appearance.  Pulmonary:     Effort: Pulmonary effort is normal. No respiratory distress.  Skin:    General: Skin is warm and dry.  Neurological:     Mental Status: She is alert and oriented to person, place, and time.  Psychiatric:        Mood and Affect: Mood normal.        Behavior: Behavior normal.      Imaging: No results found.  Labs:  CBC: Recent Labs    11/16/18 1028 11/16/18 1028 09/30/19 0815 09/30/19 0843 10/01/19 0530 10/08/19 1525  WBC 6.8  --  6.9  --  4.6 6.9  HGB 13.9   < > 13.1  12.9 11.6* 13.0  HCT 42.0   < > 40.8 38.0 35.6* 39.7  PLT 262.0  --  233  --  177 307.0   < > = values in this interval not displayed.    COAGS: Recent Labs    09/30/19 0815  INR 1.0  APTT 25    BMP: Recent Labs    05/10/19 1008 05/10/19 1008 09/30/19 0815 09/30/19 0843 10/01/19 0530 10/08/19 1525  NA 143   < > 141 138 140 141  K 4.2   < > 3.9 4.1 4.1 3.8  CL 105   < > 107 108 111 106  CO2 29  --  22  --  19* 24  GLUCOSE 85   < > 113* 110* 120* 108*  BUN 21   < > 24* 33* 21 23  CALCIUM 9.3  --  9.2  --  8.8* 9.1  CREATININE 1.62*   < > 1.77* 1.60* 1.52* 1.80*  GFRNONAA  --   --  26*  --  31*  --   GFRAA  --   --  30*  --  36*  --    < > = values in this interval not displayed.    LIVER FUNCTION TESTS: Recent Labs    02/15/19 1005 05/10/19 1008 09/30/19 0815 10/08/19 1525  BILITOT 0.5 0.6 0.8 0.5  AST 13 15 23 17   ALT 7 8 13 14   ALKPHOS 108 105 104 114  PROT 6.4 6.3 6.6 6.7  ALBUMIN 4.1 4.2 3.9 4.2     Assessment and Plan:  Right PICA aneurysm. Dr. Estanislado Pandy was present for consultation. Discussed progress post-stroke. Patient states that she is "back to my normal self". States that she is independent at home. Discussed right PICA aneurysm. Discussed nature of  aneurysms. Explained that there are two management options moving forward- either continued conservative management including routine imaging scans to monitor for changes or with an endovascular embolization procedure. Explained procedure, including risks and benefits. Patient expresses desire to move forward with conservative management at this time.  Discussed hypertension. Advised patient to follow-up with her cardiologist regularly regarding hypertension management.  Plan for follow-up with a MRA head (without contrast) 6 months from procedure 09/30/2019. Informed patient that our schedulers will call her to set up this imaging scan. Instructed patient to continue taking Eliquis 2.5 mg twice daily.  All questions answered and concerns addressed. Patient conveys understanding and agrees with plan.  Thank you for this interesting consult.  I greatly enjoyed meeting Najiyah Paris and look forward to participating in their care.  A copy of this report was sent to the requesting provider on this date.  Electronically Signed: Earley Abide, PA-C 11/04/2019, 8:51 AM   I spent a total of 25 Minutes in face to face in clinical consultation, greater than 50% of which was counseling/coordinating care for right PICA aneurysm.

## 2019-11-12 ENCOUNTER — Ambulatory Visit: Payer: Medicare Other | Admitting: Family Medicine

## 2019-11-13 ENCOUNTER — Ambulatory Visit: Payer: Medicare Other

## 2019-11-13 ENCOUNTER — Inpatient Hospital Stay: Payer: Self-pay | Admitting: Adult Health

## 2019-11-15 ENCOUNTER — Other Ambulatory Visit: Payer: Self-pay

## 2019-11-15 ENCOUNTER — Other Ambulatory Visit: Payer: Self-pay | Admitting: *Deleted

## 2019-11-15 NOTE — Patient Outreach (Signed)
[facility]) Care Management  11/15/2019  Belinda Anderson 1932/01/08 [id number]   [facility] follow up with EMMI stroke referred patient (on APL)   Insurance:BCBS [place] [facility] x1ED visits x 1in the last 6 months [facility] 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 - secondary to new diagnosed Afib  Outreach to the listed home/mobile number  Belinda Anderson, daughter answered and is able to verify HIPAA (date of birth (DOB) and address) Discussed follow after EMMI contact and alerts addressed and resolved, to follow up for any care coordination needs   Follow up   Since last contact Belinda Anderson had an office visit with Gastroenterologist Belinda Anderson for prolapsing internal hemorrhoid, loose stool with blood on 10/16/19 (referred to Belinda Belinda Anderson [facility] for abnormal hemorrhoid) and a visit with primary care provider (PCP), Belinda Belinda Anderson on 10/21/19( recommend DASH diet with education and change in potassium). Belinda Anderson denies any questions or care coordination for this is needed.  11/04/19 Interventional radiology (IR) visit for right PICA aneurysm -to continue with conservative management to include routine imaging monitoring for changes  Belinda Anderson voices understanding and denies any needed care coordination needed for this.Belinda Melnick RN CM mentioned the cancelled appointment with neurology on 11/13/19 and inquired about assistance with rescheduling the appointment. Belinda Anderson informed [facility] RN CM that she would reschedule the appointment but was informed that Belinda Anderson, IR, would be managing the right PICA aneurysm versus the neurology staff.   Belinda Anderson reports Belinda Anderson memory is better and she is now independent at home, "She is back to her old self" Belinda Anderson denies concerns with Stroke, Hypertension (HTN) and Atrial fibrillation nor need of EMMI for any medical condition at this time to include aneurysm.  Belinda Anderson, Daughter  continues to be supportive, transports and attends all MD visits    Reviewed [facility] continued services options. Belinda Anderson confirms a follow up outreach quarterly would be appreciated versus a referral to [facility] health coach    [facility] RN CM will follow up with the patient in the next 84-91 days  Pt encouraged to return a call to [facility] RN CM prn  [facility] CM Care Plan Problem One     Most Recent Value  Care Plan Problem One  home management of Atrial fibrillation, Stroke, HTN  Role Documenting the Problem One  Care Management Telephonic Coordinator  Care Plan for Problem One  Active  [facility] Long Term Goal   over the next 120 days the patient wil be able to verbalize home management of atrial fibrillation, stroke and hypertension as evidence by verbalizatin during outreach and EMR documentation  [facility] Long Term Goal Start Date  11/15/19  Interventions for Problem One Long Term Goal  assess home care and care coordination needs, encouraged follow up appointments  [facility] CM Short Term Goal #1   over the next 90 dayspatient will not experience hospital readmission, as evidence by patient reporting and review of EMR during [facility] RN CCM outreach  [facility] CM Short Term Goal #1 Start Date  11/15/19  Interventions for Short Term Goal #1  Assesed home care managment, care coordination needs, encouraged follow up appointment attendance, offer EMMI education, encouraged contact to MD office if worsening symptoms      Belinda Jewel L. Noelle Penner, RN, BSN, CCM [facility] Telephonic Care Management Care Coordinator Office number [phone] Mobile number [phone]  Main [facility] number [phone] Fax number [phone]

## 2019-11-18 ENCOUNTER — Ambulatory Visit: Payer: Medicare Other | Admitting: Student

## 2019-11-18 NOTE — Progress Notes (Deleted)
PCP:  Belinda Held, DO Primary Cardiologist: No primary care provider on file. Electrophysiologist: Dr. [name] is a 86 y.o. female with past medical history of cryptogenic stroke who presents today for routine electrophysiology followup. They are seen for Dr. Curt Anderson.   Since last being seen by our team, the patient reports doing very well.  She was admitted 09/2019 for cryptogenic stroke, and found to have AF on tele. Started on Eliquis.   The patient feels that she is tolerating medications without difficulties and is otherwise without complaint today.   Past Medical History:  Diagnosis Date  . Arthritis   . Atrial fibrillation ([place])   . Hypertension   . Thyroid disease   . TIA (transient ischemic attack)    Past Surgical History:  Procedure Laterality Date  . EYE SURGERY     lens implant  . IR ANGIO VERTEBRAL SEL SUBCLAVIAN INNOMINATE UNI R MOD SED  09/30/2019  . IR CT HEAD LTD  09/30/2019  . IR PERCUTANEOUS ART THROMBECTOMY/INFUSION INTRACRANIAL INC DIAG ANGIO  09/30/2019  . RADIOLOGY WITH ANESTHESIA N/A 09/30/2019   Procedure: IR WITH ANESTHESIA;  Surgeon: Luanne Bras, MD;  Location: [facility];  Service: Radiology;  Laterality: N/A;    Current Outpatient Medications  Medication Sig Dispense Refill  . acetaminophen (TYLENOL) 650 MG CR tablet Take 1,300 mg by mouth 3 (three) times daily.    Marland Kitchen amLODipine (NORVASC) 2.5 MG tablet TAKE 1 TABLET BY MOUTH ONCE DAILY 90 tablet 1  . apixaban (ELIQUIS) 2.5 MG TABS tablet Take 1 tablet (2.5 mg total) by mouth 2 (two) times daily. 60 tablet 2  . atorvastatin (LIPITOR) 40 MG tablet Take 1 tablet (40 mg total) by mouth daily at 6 PM. 30 tablet 2  . diclofenac sodium (VOLTAREN) 1 % GEL Apply 4 g topically 4 (four) times daily. (Patient taking differently: Apply 4 g topically 4 (four) times daily as needed (joint pain). ) 100 g 2  . furosemide (LASIX) 40 MG tablet Take 1 tablet (40 mg total) by mouth daily. 90  tablet 3  . hydrocortisone (ANUSOL-HC) 2.5 % rectal cream Place 1 application rectally 2 (two) times daily. 30 g 0  . hydrocortisone (ANUSOL-HC) 25 MG suppository Place 1 suppository (25 mg total) rectally 2 (two) times daily. 12 suppository 0  . levothyroxine (SYNTHROID) 75 MCG tablet TAKE 1 TABLET(75 MCG) BY MOUTH DAILY 90 tablet 3  . potassium chloride SA (KLOR-CON) 20 MEQ tablet Take 1 tablet (20 mEq total) by mouth daily. 90 tablet 1   No current facility-administered medications for this visit.    No Known Allergies  Social History   Socioeconomic History  . Marital status: Widowed    Spouse name: Not on file  . Number of children: 3  . Years of education: Not on file  . Highest education level: Not on file  Occupational History  . Not on file  Tobacco Use  . Smoking status: Never Smoker  . Smokeless tobacco: Never Used  Substance and Sexual Activity  . Alcohol use: No  . Drug use: No  . Sexual activity: Not Currently    Partners: Male  Other Topics Concern  . 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 Difficulty of Paying Living Expenses: Not on file  Food Insecurity:   . Worried About  Running Out of Food in the Last Year: Not on file  . Ran Out of Food in the Last Year: Not on file  Transportation Needs: No Transportation Needs  . Lack of Transportation (Medical): No  . Lack of Transportation (Non-Medical): No  Physical Activity:   . Days of Exercise per Week: Not on file  . Minutes of Exercise per Session: Not on file  Stress:   . Feeling of Stress : Not on file  Social Connections:   . Frequency of Communication with Friends and Family: Not on file  . Frequency of Social Gatherings with Friends and Family: Not on file  . Attends Religious Services: Not on file  . Active Member of Clubs or Organizations: Not on file  . Attends  Banker Meetings: Not on file  . Marital Status: Not on file  Intimate Partner Violence:   . Fear of Current or Ex-Partner: Not on file  . Emotionally Abused: Not on file  . Physically Abused: Not on file  . Sexually Abused: Not on file     Review of Systems: General: No chills, fever, night sweats or weight changes  Cardiovascular:  No chest pain, dyspnea on exertion, edema, orthopnea, palpitations, paroxysmal nocturnal dyspnea Dermatological: No rash, lesions or masses Respiratory: No cough, dyspnea Urologic: No hematuria, dysuria Abdominal: No nausea, vomiting, diarrhea, bright red blood per rectum, melena, or hematemesis Neurologic: No visual changes, weakness, changes in mental status All other systems reviewed and are otherwise negative except as noted above.  Physical Exam: There were no vitals filed for this visit.  GEN- The patient is well appearing, alert and oriented x 3 today.   HEENT: normocephalic, atraumatic; sclera clear, conjunctiva pink; hearing intact; oropharynx clear; neck supple, no JVP Lymph- no cervical lymphadenopathy Lungs- Clear to ausculation bilaterally, normal work of breathing.  No wheezes, rales, rhonchi Heart- Regular rate and rhythm, no murmurs, rubs or gallops, PMI not laterally displaced GI- soft, non-tender, non-distended, bowel sounds present, no hepatosplenomegaly Extremities- no clubbing, cyanosis, or edema; DP/PT/radial pulses 2+ bilaterally MS- no significant deformity or atrophy Skin- warm and dry, no rash or lesion Psych- euthymic mood, full affect Neuro- strength and sensation are intact  EKG is not ordered. Personal review of EKG from {Blank single:19197::"today","***"} shows ***  Assessment and Plan:  1. Paroxysmal AF Continue Eliquis for CHA2DS2VASC of at least 7    2. Cryptogenic stroke  *** deficits.   Graciella Freer, PA-C  11/18/19 8:59 AM

## 2019-11-19 ENCOUNTER — Telehealth: Payer: Self-pay | Admitting: *Deleted

## 2019-11-19 MED ORDER — PRAVASTATIN SODIUM 40 MG PO TABS: 40.0000 mg | ORAL_TABLET | Freq: Every day | ORAL | 1 refills | Status: DC

## 2019-11-19 NOTE — Telephone Encounter (Signed)
Refill- pravastatin °

## 2019-11-19 NOTE — Telephone Encounter (Signed)
Yes[---  labs done in dec 2020

## 2019-11-21 ENCOUNTER — Other Ambulatory Visit: Payer: Self-pay

## 2019-11-21 ENCOUNTER — Ambulatory Visit (INDEPENDENT_AMBULATORY_CARE_PROVIDER_SITE_OTHER): Payer: Medicare Other | Admitting: Family Medicine

## 2019-11-21 ENCOUNTER — Encounter: Payer: Self-pay | Admitting: Family Medicine

## 2019-11-21 VITALS — HR 60 | Temp 97.0°F

## 2019-11-21 DIAGNOSIS — R05 Cough: Secondary | ICD-10-CM | POA: Insufficient documentation

## 2019-11-21 DIAGNOSIS — U071 COVID-19: Secondary | ICD-10-CM

## 2019-11-21 DIAGNOSIS — R059 Cough, unspecified: Secondary | ICD-10-CM

## 2019-11-21 MED ORDER — AZITHROMYCIN 250 MG PO TABS: ORAL_TABLET | ORAL | 0 refills | Status: DC

## 2019-11-21 NOTE — Assessment & Plan Note (Signed)
If symptoms worsen-- call back and we can get her into resp clinic Or go to ER

## 2019-11-21 NOTE — Assessment & Plan Note (Signed)
con't and worsening  z pak--- con't coricidin hbp or mucinex.  Call back if symptoms worsen or go to ER

## 2019-11-21 NOTE — Progress Notes (Signed)
Virtual Visit via tele visit   I connected with Belinda Anderson on 11/21/19 at 11:40 AM EST by d telemedicine application and verified that I am speaking with the correct person using two identifiers.  Location: Patient: home with daughter  Provider: office    I discussed the limitations of evaluation and management by telemedicine and the availability of in person appointments. The patient expressed understanding and agreed to proceed.  History of Present Illness: Pt is home with her daughter c/o congested --  + covid  At [facility].     pt has developed a dry cough    No chest pain ,  No fever   Observations/Objective:   Vitals:   11/21/19 1148  Pulse: 60  Temp: (!) 97 F (36.1 C)  SpO2: 96%   Pt in NAD   Assessment and Plan: 1. Cough Slowly worsening  Start z pack-- con't coricidin and mucinex resp clinic or er if worsens  - azithromycin (ZITHROMAX Z-PAK) 250 MG tablet; As directed  Dispense: 6 each; Refill: 0  2. COVID-19 virus infection See above   Follow Up Instructions:    I discussed the assessment and treatment plan with the patient. The patient was provided an opportunity to ask questions and all were answered. The patient agreed with the plan and demonstrated an understanding of the instructions.   The patient was advised to call back or seek an in-person evaluation if the symptoms worsen or if the condition fails to improve as anticipated.  I provided 20 minutes of non-face-to-face time during this encounter.   [name], DO

## 2019-11-22 ENCOUNTER — Ambulatory Visit: Payer: Medicare Other

## 2019-11-29 ENCOUNTER — Other Ambulatory Visit: Payer: Self-pay

## 2019-11-29 ENCOUNTER — Ambulatory Visit (INDEPENDENT_AMBULATORY_CARE_PROVIDER_SITE_OTHER): Payer: Medicare Other | Admitting: Family Medicine

## 2019-11-29 DIAGNOSIS — U071 COVID-19: Secondary | ICD-10-CM | POA: Diagnosis not present

## 2019-11-29 DIAGNOSIS — R197 Diarrhea, unspecified: Secondary | ICD-10-CM

## 2019-11-29 NOTE — Progress Notes (Signed)
Virtual Visit via Video Note  I connected with Belinda Anderson on 12/01/19 at  1:20 PM EST by a video enabled telemedicine application and verified that I am speaking with the correct person using two identifiers.  Location: Patient: home with daughter  Provider: office    I discussed the limitations of evaluation and management by telemedicine and the availability of in person appointments. The patient expressed understanding and agreed to proceed.  History of Present Illness: Pt is home with her daughter    GI app pending -- still having loose stools  But its difficult to get accurate hx due to pt memory problem  She also c/o some congestion , runny nose    No fever   Tested positive for covid 2/4  Some sob and pulse ox drops with any exertion  Observations/Objective: There were no vitals filed for this visit. Pt is in nad -----  Pulse ox drops below 85 with exertion and with rest 97%  Daughter is giving the history   Assessment and Plan:  1. Diarrhea, unspecified type Gi app pending   2. COVID-19 virus infection With congestion and hypoxia  Pt pulse ox 97% at rest  Will go to Resp clinic Monday ---- to Er over the weekend if symptoms worsen Daughter understands and agrees   Follow Up Instructions:    I discussed the assessment and treatment plan with the patient. The patient was provided an opportunity to ask questions and all were answered. The patient agreed with the plan and demonstrated an understanding of the instructions.   The patient was advised to call back or seek an in-person evaluation if the symptoms worsen or if the condition fails to improve as anticipated.  I provided 25 minutes of non-face-to-face time during this encounter.   [name], DO

## 2019-12-01 ENCOUNTER — Encounter: Payer: Self-pay | Admitting: Family Medicine

## 2019-12-01 NOTE — Assessment & Plan Note (Signed)
GI app pending

## 2019-12-01 NOTE — Assessment & Plan Note (Signed)
Pt to go to resp clinic Monday She will go to ER if symptoms worsen over the weekend

## 2019-12-02 ENCOUNTER — Encounter: Payer: Self-pay | Admitting: Family Medicine

## 2019-12-02 ENCOUNTER — Ambulatory Visit (INDEPENDENT_AMBULATORY_CARE_PROVIDER_SITE_OTHER): Payer: Medicare Other | Admitting: Family Medicine

## 2019-12-02 ENCOUNTER — Other Ambulatory Visit: Payer: Self-pay

## 2019-12-02 ENCOUNTER — Other Ambulatory Visit: Payer: Self-pay | Admitting: Family Medicine

## 2019-12-02 ENCOUNTER — Other Ambulatory Visit (INDEPENDENT_AMBULATORY_CARE_PROVIDER_SITE_OTHER): Payer: Medicare Other

## 2019-12-02 VITALS — BP 130/62 | HR 69 | Ht 64.0 in | Wt 144.0 lb

## 2019-12-02 DIAGNOSIS — U071 COVID-19: Secondary | ICD-10-CM | POA: Diagnosis not present

## 2019-12-02 DIAGNOSIS — I4891 Unspecified atrial fibrillation: Secondary | ICD-10-CM | POA: Diagnosis not present

## 2019-12-02 DIAGNOSIS — R34 Anuria and oliguria: Secondary | ICD-10-CM

## 2019-12-02 DIAGNOSIS — R0981 Nasal congestion: Secondary | ICD-10-CM

## 2019-12-02 DIAGNOSIS — R32 Unspecified urinary incontinence: Secondary | ICD-10-CM | POA: Diagnosis not present

## 2019-12-02 MED ORDER — FLUTICASONE PROPIONATE 50 MCG/ACT NA SUSP: 2.0000 | Freq: Every day | NASAL | 12 refills | Status: DC

## 2019-12-02 NOTE — Progress Notes (Signed)
PCP:  Belinda Held, DO Electrophysiologist: Belinda Anderson is a 86 y.o. female with past medical history of cryptogenic stroke which led to diagnosis of atrial fibrillation who presents today for routine electrophysiology followup. They are seen for Dr. Curt Anderson.   Since last being seen by our team, the patient reports doing well after a COVID diagnosis.  She had ups and downs of her heart rate during this time, but has since balanced out.  She didn't feel it, but her daughter reports HRs > 100 at times.  She has not missed any doses of the Eliquis, but daughter is worried about her remembering the evening doses. She denies orthopnea, PND, chest pain, or SOB.   The patient feels that she is tolerating medications without difficulties and is otherwise without complaint today.   Past Medical History:  Diagnosis Date  . Arthritis   . Atrial fibrillation ([place])   . Hypertension   . Thyroid disease   . TIA (transient ischemic attack)    Past Surgical History:  Procedure Laterality Date  . EYE SURGERY     lens implant  . IR ANGIO VERTEBRAL SEL SUBCLAVIAN INNOMINATE UNI R MOD SED  09/30/2019  . IR CT HEAD LTD  09/30/2019  . IR PERCUTANEOUS ART THROMBECTOMY/INFUSION INTRACRANIAL INC DIAG ANGIO  09/30/2019  . RADIOLOGY WITH ANESTHESIA N/A 09/30/2019   Procedure: IR WITH ANESTHESIA;  Surgeon: Belinda Bras, MD;  Location: [facility];  Service: Radiology;  Laterality: N/A;    Current Outpatient Medications  Medication Sig Dispense Refill  . acetaminophen (TYLENOL) 650 MG CR tablet Take 1,300 mg by mouth 3 (three) times daily.    Marland Kitchen amLODipine (NORVASC) 2.5 MG tablet TAKE 1 TABLET BY MOUTH ONCE DAILY 90 tablet 3  . apixaban (ELIQUIS) 2.5 MG TABS tablet Take 1 tablet (2.5 mg total) by mouth 2 (two) times daily. 180 tablet 3  . atorvastatin (LIPITOR) 40 MG tablet Take 40 mg by mouth daily.    . diclofenac sodium (VOLTAREN) 1 % GEL Apply 4 g topically 4 (four) times daily.  100 g 2  . fluticasone (FLONASE) 50 MCG/ACT nasal spray Place 2 sprays into both nostrils daily. 16 g 12  . furosemide (LASIX) 40 MG tablet Take 1 tablet (40 mg total) by mouth daily. 90 tablet 3  . hydrocortisone (ANUSOL-HC) 2.5 % rectal cream Place 1 application rectally 2 (two) times daily. 30 g 0  . hydrocortisone (ANUSOL-HC) 25 MG suppository Place 1 suppository (25 mg total) rectally 2 (two) times daily. 12 suppository 0  . levothyroxine (SYNTHROID) 75 MCG tablet TAKE 1 TABLET(75 MCG) BY MOUTH DAILY 90 tablet 3  . potassium chloride SA (KLOR-CON) 20 MEQ tablet Take 1 tablet (20 mEq total) by mouth daily. 90 tablet 1   No current facility-administered medications for this visit.    No Known Allergies  Social History   Socioeconomic History  . Marital status: Widowed    Spouse name: Not on file  . Number of children: 3  . Years of education: Not on file  . Highest education level: Not on file  Occupational History  . Not on file  Tobacco Use  . Smoking status: Never Smoker  . Smokeless tobacco: Never Used  Substance and Sexual Activity  . Alcohol use: No  . Drug use: No  . Sexual activity: Not Currently    Partners: Male  Other Topics Concern  . 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 Difficulty of Paying Living Expenses: Not on file  Food Insecurity:   . Worried About Programme researcher, broadcasting/film/video in the Last Year: Not on file  . Ran Out of Food in the Last Year: Not on file  Transportation Needs: No Transportation Needs  . Lack of Transportation (Medical): No  . Lack of Transportation (Non-Medical): No  Physical Activity:   . Days of Exercise per Week: Not on file  . Minutes of Exercise per Session: Not on file  Stress:   . Feeling of Stress : Not on file  Social Connections:   . Frequency of Communication with Friends and Family: Not on  file  . Frequency of Social Gatherings with Friends and Family: Not on file  . Attends Religious Services: Not on file  . Active Member of Clubs or Organizations: Not on file  . Attends Banker Meetings: Not on file  . Marital Status: Not on file  Intimate Partner Violence:   . Fear of Current or Ex-Partner: Not on file  . Emotionally Abused: Not on file  . Physically Abused: Not on file  . Sexually Abused: Not on file     Review of Systems: General: No chills, fever, night sweats or weight changes  Cardiovascular:  No chest pain, dyspnea on exertion, edema, orthopnea, palpitations, paroxysmal nocturnal dyspnea Dermatological: No rash, lesions or masses Respiratory: No cough, dyspnea Urologic: No hematuria, dysuria Abdominal: No nausea, vomiting, diarrhea, bright red blood per rectum, melena, or hematemesis Neurologic: No visual changes, weakness, changes in mental status All other systems reviewed and are otherwise negative except as noted above.  Physical Exam: Vitals:   12/03/19 1031  BP: 126/62  Pulse: 62  SpO2: 98%  Weight: 145 lb 6.4 oz (66 kg)  Height: 5\' 4"  (1.626 m)    GEN- The patient is well appearing, alert and oriented x 3 today.   HEENT: normocephalic, atraumatic; sclera clear, conjunctiva pink; hearing intact; oropharynx clear; neck supple, no JVP Lymph- no cervical lymphadenopathy Lungs- Clear to ausculation bilaterally, normal work of breathing.  No wheezes, rales, rhonchi Heart- Regular rate and rhythm, no murmurs, rubs or gallops, PMI not laterally displaced GI- soft, non-tender, non-distended, bowel sounds present, no hepatosplenomegaly Extremities- no clubbing, cyanosis, or edema; DP/PT/radial pulses 2+ bilaterally MS- no significant deformity or atrophy Skin- warm and dry, no rash or lesion Psych- euthymic mood, full affect Neuro- strength and sensation are intact  EKG is ordered. Personal review of EKG from today shows NSR at 62 bpm  and normal intervals  Assessment and Plan:  1. Paroxysmal atrial fibrillation NSR today by EKG Continue Eliquis for CHA2DS2VASC of at least 7   She was felt to have breakthrough afib during recent COVID diagnosis. Has been quiet since Recommended an alarming pill box for eliquis compliance.  We could consider xarelto in the future for once daily use if this becomes an issue.   2. Cryptogenic stroke She is recovering well.   3. COVID She had a positive test just over 2 weeks ago Energy level isn't quite back to normal. Cough is better.   , PA-C  12/03/19 11:02 AM

## 2019-12-02 NOTE — Progress Notes (Signed)
Patient ID: Belinda Anderson, female    DOB: [birth year], 86 y.o.   MRN: [id number]  PCP: [name], DO  No chief complaint on file.   Subjective:  HPI  [name] is a 86 y.o. female presents to [facility] clinic for evaluation of symptoms related to COVID-19.   Patient symptoms have improved since referred here by PCP. Last week she had persistent diarrhea which contained visible blood per daughter. Patient's daughter reports mother has a planned visit with GI regarding work up for melena. Bloody diarrhea has resolved, although stools remain rather loose.  Daughter concerned regarding dark urine and diminished urine output. Urine sample was left at PCP office today. Patient endorses not drinking very much. Since becoming ill, patient is residing at daughter's home and admits to not eating or drinking at baseline. She has underlying renal disease and history of atrial fibrillation which increases risk for complications related to COVID-19. She is without any significant respiratory symptoms such as persistent cough, nasal congestion, or shortness of breath. Daughter reported a concern related to low oxygen levels and SOB to PCP last week, however patient denies any overt symptoms of SOB and oxygen level at rest is 98-99%. She has mild SOB at baseline wit exertional activities. Denies any active dyspnea symptoms today.   Review of Systems Pertinent negatives listed in HPI  Patient Active Problem List   Diagnosis Date Noted  . COVID-19 virus infection 11/21/2019  . Cough 11/21/2019  . Hyperlipidemia associated with type 2 diabetes mellitus ([place]) 10/12/2019  . External hemorrhoid, bleeding 10/12/2019  . Atrial fibrillation ([place]) 10/02/2019  . 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 ([place]) s/p IR R MCA M2 09/30/2019  . Middle cerebral artery embolism, right 09/30/2019  . Contusion of foot including toes, right, initial encounter 08/10/2018  . Primary  osteoarthritis of right knee 07/31/2018  . Right leg swelling 07/17/2018  . Pain of right calf 07/17/2018  . Preventative health care 09/18/2017  . Low back pain radiating to left leg 11/28/2016  . Hyperlipidemia 02/02/2015  . Sciatica 07/16/2014  . Knee pain, acute 07/16/2014  . Lower extremity pain, right 07/16/2014  . Skin infection 07/16/2014  . Incontinence of urine in female 06/19/2013  . Cerebral aneurysm rupture ([place]) 04/08/2013  . LEG EDEMA, RIGHT 07/06/2010  . OTHER DRUG ALLERGY 07/06/2010  . DIARRHEA 02/12/2010  . HEMATURIA, HX OF 11/24/2009  . ANEURYSM, HX OF 08/03/2009  . B12 DEFICIENCY 07/24/2009  . 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 SYNCOPE 07/20/2009  . DIZZINESS 07/20/2009  . Memory loss 07/20/2009  . 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 Hypothyroidism 12/14/2007  . Essential hypertension 12/14/2007  . DIVERTICULITIS, HX OF 12/14/2007  . GOITER 01/30/2007  . HYPERTENSION, BENIGN 01/30/2007  . UTERINE POLYP 01/30/2007  . THYROIDECTOMY, HX OF 01/30/2007      Prior to Admission medications   Medication Sig Start Date End Date Taking? Authorizing Provider  acetaminophen (TYLENOL) 650 MG CR tablet Take 1,300 mg by mouth 3 (three) times daily.    [provider]  amLODipine (NORVASC) 2.5 MG tablet TAKE 1 TABLET BY MOUTH ONCE DAILY 10/21/19   [name], [name], DO  apixaban (ELIQUIS) 2.5 MG TABS tablet Take 1 tablet (2.5 mg total) by mouth 2 (two) times daily. 10/02/19   Donzetta Starch, NP  azithromycin (ZITHROMAX Z-PAK) 250 MG tablet As directed 11/21/19   Roma Schanz R, DO  diclofenac sodium (VOLTAREN) 1 % GEL  Apply 4 g topically 4 (four) times daily. Patient taking differently: Apply 4 g topically 4 (four) times daily as needed (joint pain).  05/10/19   [name], DO  furosemide (LASIX) 40 MG tablet Take 1 tablet (40 mg total) by mouth daily. 10/21/19   [name], DO  hydrocortisone (ANUSOL-HC)  2.5 % rectal cream Place 1 application rectally 2 (two) times daily. 10/08/19   [name], DO  hydrocortisone (ANUSOL-HC) 25 MG suppository Place 1 suppository (25 mg total) rectally 2 (two) times daily. 10/08/19   Seabron Spates R, DO  levothyroxine (SYNTHROID) 75 MCG tablet TAKE 1 TABLET(75 MCG) BY MOUTH DAILY 10/21/19   [name], Grayling Congress, DO  potassium chloride SA (KLOR-CON) 20 MEQ tablet Take 1 tablet (20 mEq total) by mouth daily. 10/21/19   [name], DO  pravastatin (PRAVACHOL) 40 MG tablet Take 1 tablet (40 mg total) by mouth daily. 11/19/19   [name], DO    Past Medical, Surgical Family and Social History reviewed and updated.    Objective:  There were no vitals filed for this visit.  Wt Readings from Last 3 Encounters:  10/21/19 146 lb 9.6 oz (66.5 kg)  10/16/19 145 lb (65.8 kg)  10/08/19 148 lb 6.4 oz (67.3 kg)   Physical Exam Constitutional:      General: She is not in acute distress.    Appearance: She is not toxic-appearing.     Comments: Chronically ill appearing   HENT:     Head: Normocephalic.  Cardiovascular:     Rate and Rhythm: Normal rate. Rhythm irregular.  Pulmonary:     Effort: Pulmonary effort is normal.     Breath sounds: Normal breath sounds.  Abdominal:     Palpations: Abdomen is soft.  Musculoskeletal:     Right lower leg: No edema.  Skin:    General: Skin is warm and dry.  Neurological:     Mental Status: She is alert. Mental status is at baseline.  Psychiatric:        Attention and Perception: Attention normal.        Mood and Affect: Mood normal.      Assessment & Plan:  1. Decreased urine output, -Unable to complete UA here this evening. However UA and Urine culture pending with PCP -Comprehensive metabolic panel, pending to evaluate renal function -Encouraged forcing fluids to maintain hydration and strictly monitoring output. Follow-up with PCP if patient goes greater 6-8 hours without any urine  output. Go the ER if she has the urge to urinate however, is unable to produce urine    2. COVID-19 infection, stable  Patient appears overall stable and exam findings are reassuring  CBC and CMP pending   3. Atrial fibrillation, unspecified type (HCC) -Stable, HR <90.  - CBC  4. Nasal congestion -Flonase 2 sprays daily as needed    -The patient was given clear instructions to go to ER or return to medical center if symptoms do not improve, worsen or new problems develop. The patient verbalized understanding.     Joaquin Courts, FNP-C [facility], PRN Provider  [facility]. [place], [place]  Clinic Phone: [phone] Clinic Fax: [phone] Clinic Hours: 5:30 pm -7:30 pm (Monday-Friday)

## 2019-12-02 NOTE — Progress Notes (Signed)
ua

## 2019-12-02 NOTE — Patient Instructions (Signed)
Recommendation for management of symptoms include:  Vitamin D 5,000 IU daily Vitamin C 500 mg twice daily Zinc 50 mg daily   I have prescribed Flonase 2 sprays per nares once daily for nasal congestion.  Continue to force fluids to reduce hydration.  I will notify you of lab results via MyChart    COVID-19 COVID-19 is a respiratory infection that is caused by a virus called severe acute respiratory syndrome coronavirus 2 (SARS-CoV-2). The disease is also known as coronavirus disease or novel coronavirus. In some people, the virus may not cause any symptoms. In others, it may cause a serious infection. The infection can get worse quickly and can lead to complications, such as:  Pneumonia, or infection of the lungs.  Acute respiratory distress syndrome or ARDS. This is a condition in which fluid build-up in the lungs prevents the lungs from filling with air and passing oxygen into the blood.  Acute respiratory failure. This is a condition in which there is not enough oxygen passing from the lungs to the body or when carbon dioxide is not passing from the lungs out of the body.  Sepsis or septic shock. This is a serious bodily reaction to an infection.  Blood clotting problems.  Secondary infections due to bacteria or fungus.  Organ failure. This is when your body's organs stop working. The virus that causes COVID-19 is contagious. This means that it can spread from person to person through droplets from coughs and sneezes (respiratory secretions). What are the causes? This illness is caused by a virus. You may catch the virus by:  Breathing in droplets from an infected person. Droplets can be spread by a person breathing, speaking, singing, coughing, or sneezing.  Touching something, like a table or a doorknob, that was exposed to the virus (contaminated) and then touching your mouth, nose, or eyes. What increases the risk? Risk for infection You are more likely to be infected  with this virus if you:  Are within 6 feet (2 meters) of a person with COVID-19.  Provide care for or live with a person who is infected with COVID-19.  Spend time in crowded indoor spaces or live in shared housing. Risk for serious illness You are more likely to become seriously ill from the virus if you:  Are 84 years of age or older. The higher your age, the more you are at risk for serious illness.  Live in a nursing home or long-term care facility.  Have cancer.  Have a long-term (chronic) disease such as: ? Chronic lung disease, including chronic obstructive pulmonary disease or asthma. ? A long-term disease that lowers your body's ability to fight infection (immunocompromised). ? Heart disease, including heart failure, a condition in which the arteries that lead to the heart become narrow or blocked (coronary artery disease), a disease which makes the heart muscle thick, weak, or stiff (cardiomyopathy). ? Diabetes. ? Chronic kidney disease. ? Sickle cell disease, a condition in which red blood cells have an abnormal "sickle" shape. ? Liver disease.  Are obese. What are the signs or symptoms? Symptoms of this condition can range from mild to severe. Symptoms may appear any time from 2 to 14 days after being exposed to the virus. They include:  A fever or chills.  A cough.  Difficulty breathing.  Headaches, body aches, or muscle aches.  Runny or stuffy (congested) nose.  A sore throat.  New loss of taste or smell. Some people may also have stomach problems, such  as nausea, vomiting, or diarrhea. Other people may not have any symptoms of COVID-19. How is this diagnosed? This condition may be diagnosed based on:  Your signs and symptoms, especially if: ? You live in an area with a COVID-19 outbreak. ? You recently traveled to or from an area where the virus is common. ? You provide care for or live with a person who was diagnosed with COVID-19. ? You were exposed  to a person who was diagnosed with COVID-19.  A physical exam.  Lab tests, which may include: ? Taking a sample of fluid from the back of your nose and throat (nasopharyngeal fluid), your nose, or your throat using a swab. ? A sample of mucus from your lungs (sputum). ? Blood tests.  Imaging tests, which may include, X-rays, CT scan, or ultrasound. How is this treated? At present, there is no medicine to treat COVID-19. Medicines that treat other diseases are being used on a trial basis to see if they are effective against COVID-19. Your health care provider will talk with you about ways to treat your symptoms. For most people, the infection is mild and can be managed at home with rest, fluids, and over-the-counter medicines. Treatment for a serious infection usually takes places in a hospital intensive care unit (ICU). It may include one or more of the following treatments. These treatments are given until your symptoms improve.  Receiving fluids and medicines through an IV.  Supplemental oxygen. Extra oxygen is given through a tube in the nose, a face mask, or a hood.  Positioning you to lie on your stomach (prone position). This makes it easier for oxygen to get into the lungs.  Continuous positive airway pressure (CPAP) or bi-level positive airway pressure (BPAP) machine. This treatment uses mild air pressure to keep the airways open. A tube that is connected to a motor delivers oxygen to the body.  Ventilator. This treatment moves air into and out of the lungs by using a tube that is placed in your windpipe.  Tracheostomy. This is a procedure to create a hole in the neck so that a breathing tube can be inserted.  Extracorporeal membrane oxygenation (ECMO). This procedure gives the lungs a chance to recover by taking over the functions of the heart and lungs. It supplies oxygen to the body and removes carbon dioxide. Follow these instructions at home: Lifestyle  If you are sick,  stay home except to get medical care. Your health care provider will tell you how long to stay home. Call your health care provider before you go for medical care.  Rest at home as told by your health care provider.  Do not use any products that contain nicotine or tobacco, such as cigarettes, e-cigarettes, and chewing tobacco. If you need help quitting, ask your health care provider.  Return to your normal activities as told by your health care provider. Ask your health care provider what activities are safe for you. General instructions  Take over-the-counter and prescription medicines only as told by your health care provider.  Drink enough fluid to keep your urine pale yellow.  Keep all follow-up visits as told by your health care provider. This is important. How is this prevented?  There is no vaccine to help prevent COVID-19 infection. However, there are steps you can take to protect yourself and others from this virus. To protect yourself:   Do not travel to areas where COVID-19 is a risk. The areas where COVID-19 is reported change often.  To identify high-risk areas and travel restrictions, check the CDC travel website: StageSync.si  If you live in, or must travel to, an area where COVID-19 is a risk, take precautions to avoid infection. ? Stay away from people who are sick. ? Wash your hands often with soap and water for 20 seconds. If soap and water are not available, use an alcohol-based hand sanitizer. ? Avoid touching your mouth, face, eyes, or nose. ? Avoid going out in public, follow guidance from your state and local health authorities. ? If you must go out in public, wear a cloth face covering or face mask. Make sure your mask covers your nose and mouth. ? Avoid crowded indoor spaces. Stay at least 6 feet (2 meters) away from others. ? Disinfect objects and surfaces that are frequently touched every day. This may include:  Counters and  tables.  Doorknobs and light switches.  Sinks and faucets.  Electronics, such as phones, remote controls, keyboards, computers, and tablets. To protect others: If you have symptoms of COVID-19, take steps to prevent the virus from spreading to others.  If you think you have a COVID-19 infection, contact your health care provider right away. Tell your health care team that you think you may have a COVID-19 infection.  Stay home. Leave your house only to seek medical care. Do not use public transport.  Do not travel while you are sick.  Wash your hands often with soap and water for 20 seconds. If soap and water are not available, use alcohol-based hand sanitizer.  Stay away from other members of your household. Let healthy household members care for children and pets, if possible. If you have to care for children or pets, wash your hands often and wear a mask. If possible, stay in your own room, separate from others. Use a different bathroom.  Make sure that all people in your household wash their hands well and often.  Cough or sneeze into a tissue or your sleeve or elbow. Do not cough or sneeze into your hand or into the air.  Wear a cloth face covering or face mask. Make sure your mask covers your nose and mouth. Where to find more information  Centers for Disease Control and Prevention: StickerEmporium.tn  World Health Organization: https://thompson-craig.com/ Contact a health care provider if:  You live in or have traveled to an area where COVID-19 is a risk and you have symptoms of the infection.  You have had contact with someone who has COVID-19 and you have symptoms of the infection. Get help right away if:  You have trouble breathing.  You have pain or pressure in your chest.  You have confusion.  You have bluish lips and fingernails.  You have difficulty waking from sleep.  You have symptoms that get worse. These symptoms  may represent a serious problem that is an emergency. Do not wait to see if the symptoms will go away. Get medical help right away. Call your local emergency services (911 in the [place] Do not drive yourself to the hospital. Let the emergency medical personnel know if you think you have COVID-19. Summary  COVID-19 is a respiratory infection that is caused by a virus. It is also known as coronavirus disease or novel coronavirus. It can cause serious infections, such as pneumonia, acute respiratory distress syndrome, acute respiratory failure, or sepsis.  The virus that causes COVID-19 is contagious. This means that it can spread from person to person through droplets from breathing, speaking, singing,  coughing, or sneezing.  You are more likely to develop a serious illness if you are 43 years of age or older, have a weak immune system, live in a nursing home, or have chronic disease.  There is no medicine to treat COVID-19. Your health care provider will talk with you about ways to treat your symptoms.  Take steps to protect yourself and others from infection. Wash your hands often and disinfect objects and surfaces that are frequently touched every day. Stay away from people who are sick and wear a mask if you are sick. This information is not intended to replace advice given to you by your health care provider. Make sure you discuss any questions you have with your health care provider. Document Revised: 08/02/2019 Document Reviewed: 11/08/2018 Elsevier Patient Education  [employer].

## 2019-12-03 ENCOUNTER — Ambulatory Visit: Payer: Medicare Other | Admitting: Student

## 2019-12-03 ENCOUNTER — Encounter: Payer: Self-pay | Admitting: Student

## 2019-12-03 VITALS — BP 126/62 | HR 62 | Ht 64.0 in | Wt 145.4 lb

## 2019-12-03 DIAGNOSIS — I10 Essential (primary) hypertension: Secondary | ICD-10-CM

## 2019-12-03 DIAGNOSIS — I480 Paroxysmal atrial fibrillation: Secondary | ICD-10-CM | POA: Diagnosis not present

## 2019-12-03 DIAGNOSIS — I63411 Cerebral infarction due to embolism of right middle cerebral artery: Secondary | ICD-10-CM

## 2019-12-03 LAB — COMPREHENSIVE METABOLIC PANEL
ALT: 15 IU/L (ref 0–32)
AST: 21 IU/L (ref 0–40)
Albumin/Globulin Ratio: 1.9 (ref 1.2–2.2)
Albumin: 4 g/dL (ref 3.6–4.6)
Alkaline Phosphatase: 159 IU/L — ABNORMAL HIGH (ref 39–117)
BUN/Creatinine Ratio: 13 (ref 12–28)
BUN: 22 mg/dL (ref 8–27)
Bilirubin Total: 0.4 mg/dL (ref 0.0–1.2)
CO2: 26 mmol/L (ref 20–29)
Calcium: 9.3 mg/dL (ref 8.7–10.3)
Chloride: 104 mmol/L (ref 96–106)
Creatinine, Ser: 1.75 mg/dL — ABNORMAL HIGH (ref 0.57–1.00)
GFR calc Af Amer: 30 mL/min/{1.73_m2} — ABNORMAL LOW (ref >59)
GFR calc non Af Amer: 26 mL/min/{1.73_m2} — ABNORMAL LOW (ref >59)
Globulin, Total: 2.1 g/dL (ref 1.5–4.5)
Glucose: 104 mg/dL — ABNORMAL HIGH (ref 65–99)
Potassium: 4.2 mmol/L (ref 3.5–5.2)
Sodium: 144 mmol/L (ref 134–144)
Total Protein: 6.1 g/dL (ref 6.0–8.5)

## 2019-12-03 LAB — URINALYSIS
Bilirubin Urine: NEGATIVE
Ketones, ur: NEGATIVE
Leukocytes,Ua: NEGATIVE
Nitrite: NEGATIVE
Specific Gravity, Urine: 1.01 (ref 1.000–1.030)
Total Protein, Urine: NEGATIVE
Urine Glucose: NEGATIVE
Urobilinogen, UA: 0.2 (ref 0.0–1.0)
pH: 5.5 (ref 5.0–8.0)

## 2019-12-03 LAB — CBC
Hematocrit: 36.9 % (ref 34.0–46.6)
Hemoglobin: 12.6 g/dL (ref 11.1–15.9)
MCH: 31.9 pg (ref 26.6–33.0)
MCHC: 34.1 g/dL (ref 31.5–35.7)
MCV: 93 fL (ref 79–97)
Platelets: 221 10*3/uL (ref 150–450)
RBC: 3.95 x10E6/uL (ref 3.77–5.28)
RDW: 12.8 % (ref 11.7–15.4)
WBC: 6.2 10*3/uL (ref 3.4–10.8)

## 2019-12-03 MED ORDER — AMLODIPINE BESYLATE 2.5 MG PO TABS: ORAL_TABLET | ORAL | 3 refills | Status: DC

## 2019-12-03 MED ORDER — APIXABAN 2.5 MG PO TABS: 2.5000 mg | ORAL_TABLET | Freq: Two times a day (BID) | ORAL | 3 refills | Status: DC

## 2019-12-03 NOTE — Progress Notes (Signed)
See attached labs and My chart message. No additional follow-up here at the Respiratory clinic indicated. Patient is stable and is without active  respiratory symptoms.

## 2019-12-03 NOTE — Patient Instructions (Signed)
Medication Instructions:  none *If you need a refill on your cardiac medications before your next appointment, please call your pharmacy*   Lab Work: none If you have labs (blood work) drawn today and your tests are completely normal, you will receive your results only by: . MyChart Message (if you have MyChart) OR . A paper copy in the mail If you have any lab test that is abnormal or we need to change your treatment, we will call you to review the results.   Testing/Procedures: none   Follow-Up: At [facility], you and your health needs are our priority.  As part of our continuing mission to provide you with exceptional heart care, we have created designated Provider Care Teams.  These Care Teams include your primary Cardiologist (physician) and Advanced Practice Providers (APPs -  Physician Assistants and Nurse Practitioners) who all work together to provide you with the care you need, when you need it.      Your next appointment:   6 month(s)  The format for your next appointment:   Either In Person or Virtual  Provider:   Dr Camnitz   Other Instructions   

## 2019-12-04 ENCOUNTER — Ambulatory Visit: Payer: Medicare Other

## 2019-12-04 ENCOUNTER — Other Ambulatory Visit: Payer: Self-pay | Admitting: Family Medicine

## 2019-12-04 ENCOUNTER — Encounter: Payer: Self-pay | Admitting: Family Medicine

## 2019-12-04 DIAGNOSIS — N390 Urinary tract infection, site not specified: Secondary | ICD-10-CM

## 2019-12-04 LAB — URINE CULTURE
MICRO NUMBER:: 10152054
SPECIMEN QUALITY:: ADEQUATE

## 2019-12-04 MED ORDER — AMOXICILLIN-POT CLAVULANATE 875-125 MG PO TABS: 1.0000 | ORAL_TABLET | Freq: Two times a day (BID) | ORAL | 0 refills | Status: DC

## 2019-12-11 ENCOUNTER — Telehealth: Payer: Self-pay | Admitting: Family Medicine

## 2019-12-11 NOTE — Telephone Encounter (Signed)
Patient's daughter Nikcole [name]) called stating that she needs help from Dr Laury Axon by filling out FLMA paperwork so that she can hold her job. Patient's mother has been dx with COVID and needs help around the house. Call by for [facility]: [phone].

## 2019-12-12 NOTE — Telephone Encounter (Addendum)
Spoke with pt's daughter. Advised patient that we can not fill out her FMLA paperwork because she is not a [facility] patient. Advised patient's daughter to get FMLA paperwork from job and then call her PCP to fill out FMLA paperwork.

## 2019-12-23 ENCOUNTER — Telehealth: Payer: Self-pay | Admitting: Family Medicine

## 2019-12-23 NOTE — Telephone Encounter (Signed)
Pt daughter Deveney Bayon ) states that she needs FLMA paper work fill out by Dr [name]. She will try to upload documentation on mychart.

## 2019-12-24 ENCOUNTER — Telehealth: Payer: Self-pay

## 2019-12-24 ENCOUNTER — Telehealth: Payer: Self-pay | Admitting: Family Medicine

## 2019-12-24 NOTE — Telephone Encounter (Signed)
Patient daughter Swetha called in to see if the nurse or Dr. Laury Axon can fill out her FMLA paper and state that the patient is living with her at this time. Please follow up with her at [phone] thanks,

## 2019-12-24 NOTE — Telephone Encounter (Signed)
Forms received. Will place in Quick Sign folder for [employer]

## 2019-12-24 NOTE — Telephone Encounter (Signed)
FMLA forms received. See other message

## 2019-12-24 NOTE — Telephone Encounter (Signed)
No FMLA paperwork uploaded to Mychart yet

## 2019-12-24 NOTE — Telephone Encounter (Signed)
Patient Daughter dropped off FMLA formed to be completed by Dr Laury Axon . Patient would like form to be faxed to her employment once completed . Fax number is [phone].

## 2019-12-26 ENCOUNTER — Ambulatory Visit: Payer: Medicare Other | Attending: Internal Medicine

## 2019-12-26 DIAGNOSIS — Z23 Encounter for immunization: Secondary | ICD-10-CM

## 2019-12-26 NOTE — Progress Notes (Signed)
   Covid-19 Vaccination Clinic  Name:  Belinda Anderson    MRN: [id number] DOB: 11-Apr-1932  12/26/2019  Ms. Belinda Anderson was observed post Covid-19 immunization for 15 minutes without incident. She was provided with Vaccine Information Sheet and instruction to access the V-Safe system.   Ms. Belinda Anderson was instructed to call 911 with any severe reactions post vaccine: Marland Kitchen Difficulty breathing  . Swelling of face and throat  . A fast heartbeat  . A bad rash all over body  . Dizziness and weakness   Immunizations Administered    Name Date Dose VIS Date Route   Pfizer COVID-19 Vaccine 12/26/2019 10:42 AM 0.3 mL 09/27/2019 Intramuscular   Manufacturer: [employer], [employer]   Lot: [id number]   NDC: [address]-7

## 2019-12-26 NOTE — Telephone Encounter (Signed)
FMLA faxed. Received confirmation

## 2019-12-31 ENCOUNTER — Other Ambulatory Visit: Payer: Self-pay

## 2019-12-31 NOTE — Patient Outreach (Signed)
Telephone outreach to patient's caregiver to obtain mRS was successfully completed. mRS=0  Baruch Gouty [facility] Management Assistant [phone]

## 2020-01-20 ENCOUNTER — Ambulatory Visit: Payer: Medicare Other | Attending: Internal Medicine

## 2020-01-20 DIAGNOSIS — Z23 Encounter for immunization: Secondary | ICD-10-CM

## 2020-01-20 NOTE — Progress Notes (Signed)
   Covid-19 Vaccination Clinic  Name:  Belinda Anderson    MRN: [id number] DOB: 1932-01-26  01/20/2020  Belinda Anderson was observed post Covid-19 immunization for 15 minutes without incident. She was provided with Vaccine Information Sheet and instruction to access the V-Safe system.   Belinda Anderson was instructed to call 911 with any severe reactions post vaccine: Marland Kitchen Difficulty breathing  . Swelling of face and throat  . A fast heartbeat  . A bad rash all over body  . Dizziness and weakness   Immunizations Administered    Name Date Dose VIS Date Route   Pfizer COVID-19 Vaccine 01/20/2020 11:06 AM 0.3 mL 09/27/2019 Intramuscular   Manufacturer: [employer], [employer]   Lot: [id number]   NDC: [address]-9

## 2020-01-30 ENCOUNTER — Ambulatory Visit (INDEPENDENT_AMBULATORY_CARE_PROVIDER_SITE_OTHER): Payer: Medicare Other | Admitting: Family Medicine

## 2020-01-30 ENCOUNTER — Encounter: Payer: Self-pay | Admitting: Family Medicine

## 2020-01-30 ENCOUNTER — Other Ambulatory Visit: Payer: Self-pay

## 2020-01-30 VITALS — BP 124/90 | HR 77 | Temp 97.1°F | Resp 18 | Ht 64.0 in | Wt 147.6 lb

## 2020-01-30 DIAGNOSIS — E785 Hyperlipidemia, unspecified: Secondary | ICD-10-CM | POA: Diagnosis not present

## 2020-01-30 DIAGNOSIS — E039 Hypothyroidism, unspecified: Secondary | ICD-10-CM | POA: Diagnosis not present

## 2020-01-30 DIAGNOSIS — R600 Localized edema: Secondary | ICD-10-CM

## 2020-01-30 DIAGNOSIS — I10 Essential (primary) hypertension: Secondary | ICD-10-CM

## 2020-01-30 DIAGNOSIS — I480 Paroxysmal atrial fibrillation: Secondary | ICD-10-CM

## 2020-01-30 DIAGNOSIS — E1169 Type 2 diabetes mellitus with other specified complication: Secondary | ICD-10-CM

## 2020-01-30 LAB — LIPID PANEL
Cholesterol: 117 mg/dL (ref 0–200)
HDL: 33.8 mg/dL — ABNORMAL LOW (ref >39.00)
LDL Cholesterol: 68 mg/dL (ref 0–99)
NonHDL: 82.78
Total CHOL/HDL Ratio: 3
Triglycerides: 75 mg/dL (ref 0.0–149.0)
VLDL: 15 mg/dL (ref 0.0–40.0)

## 2020-01-30 LAB — COMPREHENSIVE METABOLIC PANEL
ALT: 16 U/L (ref 0–35)
AST: 21 U/L (ref 0–37)
Albumin: 4 g/dL (ref 3.5–5.2)
Alkaline Phosphatase: 153 U/L — ABNORMAL HIGH (ref 39–117)
BUN: 26 mg/dL — ABNORMAL HIGH (ref 6–23)
CO2: 28 mEq/L (ref 19–32)
Calcium: 8.9 mg/dL (ref 8.4–10.5)
Chloride: 104 mEq/L (ref 96–112)
Creatinine, Ser: 1.64 mg/dL — ABNORMAL HIGH (ref 0.40–1.20)
GFR: 29.72 mL/min — ABNORMAL LOW (ref >60.00)
Glucose, Bld: 92 mg/dL (ref 70–99)
Potassium: 3.9 mEq/L (ref 3.5–5.1)
Sodium: 140 mEq/L (ref 135–145)
Total Bilirubin: 0.8 mg/dL (ref 0.2–1.2)
Total Protein: 6.1 g/dL (ref 6.0–8.3)

## 2020-01-30 LAB — TSH: TSH: 1.2 u[IU]/mL (ref 0.35–4.50)

## 2020-01-30 NOTE — Assessment & Plan Note (Signed)
Check labs 

## 2020-01-30 NOTE — Assessment & Plan Note (Signed)
On xaralto Per cardiology 

## 2020-01-30 NOTE — Assessment & Plan Note (Signed)
Inc lasix 80 mg 3-4 days then dec to 40mg   Elevate legs Compression socks

## 2020-01-30 NOTE — Assessment & Plan Note (Signed)
Tolerating statin, encouraged heart healthy diet, avoid trans fats, minimize simple carbs and saturated fats. Increase exercise as tolerated 

## 2020-01-30 NOTE — Assessment & Plan Note (Signed)
Well controlled, no changes to meds. Encouraged heart healthy diet such as the DASH diet and exercise as tolerated.  °

## 2020-01-30 NOTE — Patient Instructions (Addendum)
Increase lasix to 80 mg for 3-4 days then go back to 40 mg a d    Edema  Edema is an abnormal buildup of fluids in the body tissues and under the skin. Swelling of the legs, feet, and ankles is a common symptom that becomes more likely as you get older. Swelling is also common in looser tissues, like around the eyes. When the affected area is squeezed, the fluid may move out of that spot and leave a dent for a few moments. This dent is called pitting edema. There are many possible causes of edema. Eating too much salt (sodium) and being on your feet or sitting for a long time can cause edema in your legs, feet, and ankles. Hot weather may make edema worse. Common causes of edema include:  Heart failure.  Liver or kidney disease.  Weak leg blood vessels.  Cancer.  An injury.  Pregnancy.  Medicines.  Being obese.  Low protein levels in the blood. Edema is usually painless. Your skin may look swollen or shiny. Follow these instructions at home:  Keep the affected body part raised (elevated) above the level of your heart when you are sitting or lying down.  Do not sit still or stand for long periods of time.  Do not wear tight clothing. Do not wear garters on your upper legs.  Exercise your legs to get your circulation going. This helps to move the fluid back into your blood vessels, and it may help the swelling go down.  Wear elastic bandages or support stockings to reduce swelling as told by your health care provider.  Eat a low-salt (low-sodium) diet to reduce fluid as told by your health care provider.  Depending on the cause of your swelling, you may need to limit how much fluid you drink (fluid restriction).  Take over-the-counter and prescription medicines only as told by your health care provider. Contact a health care provider if:  Your edema does not get better with treatment.  You have heart, liver, or kidney disease and have symptoms of edema.  You have  sudden and unexplained weight gain. Get help right away if:  You develop shortness of breath or chest pain.  You cannot breathe when you lie down.  You develop pain, redness, or warmth in the swollen areas.  You have heart, liver, or kidney disease and suddenly get edema.  You have a fever and your symptoms suddenly get worse. Summary  Edema is an abnormal buildup of fluids in the body tissues and under the skin.  Eating too much salt (sodium) and being on your feet or sitting for a long time can cause edema in your legs, feet, and ankles.  Keep the affected body part raised (elevated) above the level of your heart when you are sitting or lying down. This information is not intended to replace advice given to you by your health care provider. Make sure you discuss any questions you have with your health care provider. Document Revised: 02/20/2019 Document Reviewed: 11/05/2016 Elsevier Patient Education  2020 [employer].

## 2020-01-30 NOTE — Progress Notes (Signed)
Patient ID: Belinda Anderson, female    DOB: December 17, 1931  Age: 86 y.o. MRN: [id number]    Subjective:  Subjective  HPI Belinda Anderson presents for f/u edema and f/u chol, htn and thyroid  Review of Systems  Constitutional: Negative for appetite change, diaphoresis, fatigue and unexpected weight change.  Eyes: Negative for pain, redness and visual disturbance.  Respiratory: Negative for cough, chest tightness, shortness of breath and wheezing.   Cardiovascular: Positive for leg swelling. Negative for chest pain and palpitations.  Endocrine: Negative for cold intolerance, heat intolerance, polydipsia, polyphagia and polyuria.  Genitourinary: Negative for difficulty urinating, dysuria and frequency.  Neurological: Negative for dizziness, light-headedness, numbness and headaches.    History Past Medical History:  Diagnosis Date  . Arthritis   . Atrial fibrillation (HCC)   . Hypertension   . Thyroid disease   . TIA (transient ischemic attack)     She has a past surgical history that includes Eye surgery; Radiology with anesthesia (N/A, 09/30/2019); IR PERCUTANEOUS ART THROMBECTOMY/INFUSION INTRACRANIAL INC DIAG ANGIO (09/30/2019); IR CT Head Ltd (09/30/2019); and IR ANGIO VERTEBRAL SEL SUBCLAVIAN INNOMINATE UNI R MOD SED (09/30/2019).   Her family history includes Arthritis in her mother; Coronary artery disease in an other family member; Diabetes in her brother; Heart disease in her mother; Heart disease (age of onset: 7) in her father.She reports that she has never smoked. She has never used smokeless tobacco. She reports that she does not drink alcohol or use drugs.  Current Outpatient Medications on File Prior to Visit  Medication Sig Dispense Refill  . acetaminophen (TYLENOL) 650 MG CR tablet Take 1,300 mg by mouth 3 (three) times daily.    Marland Kitchen amLODipine (NORVASC) 2.5 MG tablet TAKE 1 TABLET BY MOUTH ONCE DAILY 90 tablet 3  . apixaban (ELIQUIS) 2.5 MG TABS tablet Take 1  tablet (2.5 mg total) by mouth 2 (two) times daily. 180 tablet 3  . diclofenac sodium (VOLTAREN) 1 % GEL Apply 4 g topically 4 (four) times daily. 100 g 2  . fluticasone (FLONASE) 50 MCG/ACT nasal spray Place 2 sprays into both nostrils daily. 16 g 12  . furosemide (LASIX) 40 MG tablet Take 1 tablet (40 mg total) by mouth daily. 90 tablet 3  . hydrocortisone (ANUSOL-HC) 2.5 % rectal cream Place 1 application rectally 2 (two) times daily. 30 g 0  . hydrocortisone (ANUSOL-HC) 25 MG suppository Place 1 suppository (25 mg total) rectally 2 (two) times daily. 12 suppository 0  . levothyroxine (SYNTHROID) 75 MCG tablet TAKE 1 TABLET(75 MCG) BY MOUTH DAILY 90 tablet 3  . potassium chloride SA (KLOR-CON) 20 MEQ tablet Take 1 tablet (20 mEq total) by mouth daily. 90 tablet 1  . pravastatin (PRAVACHOL) 40 MG tablet Take 40 mg by mouth daily.    . Probiotic Product (ALIGN PO) Take by mouth.    Marland Kitchen atorvastatin (LIPITOR) 40 MG tablet Take 40 mg by mouth daily.     No current facility-administered medications on file prior to visit.     Objective:  Objective  Physical Exam Vitals and nursing note reviewed.  Constitutional:      Appearance: She is well-developed.  HENT:     Head: Normocephalic and atraumatic.  Eyes:     Conjunctiva/sclera: Conjunctivae normal.  Neck:     Thyroid: No thyromegaly.     Vascular: No carotid bruit or JVD.  Cardiovascular:     Rate and Rhythm: Normal rate and regular rhythm.  Heart sounds: Normal heart sounds. No murmur.  Pulmonary:     Effort: Pulmonary effort is normal. No respiratory distress.     Breath sounds: Normal breath sounds. No wheezing or rales.  Chest:     Chest wall: No tenderness.  Musculoskeletal:        General: Swelling present. No tenderness.     Cervical back: Normal range of motion and neck supple.     Right lower leg: 2+ Pitting Edema present.     Left lower leg: 1+ Pitting Edema present.  Neurological:     Mental Status: She is alert  and oriented to person, place, and time.    BP 124/90 (BP Location: Right Arm, Patient Position: Sitting, Cuff Size: Normal)   Pulse 77   Temp (!) 97.1 F (36.2 C) (Temporal)   Resp 18   Ht 5\' 4"  (1.626 m)   Wt 147 lb 9.6 oz (67 kg)   SpO2 100%   BMI 25.34 kg/m  Wt Readings from Last 3 Encounters:  01/30/20 147 lb 9.6 oz (67 kg)  12/03/19 145 lb 6.4 oz (66 kg)  12/02/19 144 lb (65.3 kg)     Lab Results  Component Value Date   WBC 6.2 12/02/2019   HGB 12.6 12/02/2019   HCT 36.9 12/02/2019   PLT 221 12/02/2019   GLUCOSE 104 (H) 12/02/2019   CHOL 118 10/08/2019   TRIG 155.0 (H) 10/08/2019   HDL 39.10 10/08/2019   LDLDIRECT 97.0 04/06/2018   LDLCALC 48 10/08/2019   ALT 15 12/02/2019   AST 21 12/02/2019   NA 144 12/02/2019   K 4.2 12/02/2019   CL 104 12/02/2019   CREATININE 1.75 (H) 12/02/2019   BUN 22 12/02/2019   CO2 26 12/02/2019   TSH 0.62 10/08/2019   INR 1.0 09/30/2019   HGBA1C 5.9 (H) 10/01/2019    No results found.   Assessment & Plan:  Plan  I have discontinued 10/03/2019 "Belinda Anderson"'s amoxicillin-clavulanate. I am also having her maintain her diclofenac sodium, acetaminophen, hydrocortisone, hydrocortisone, potassium chloride SA, levothyroxine, furosemide, fluticasone, atorvastatin, amLODipine, apixaban, pravastatin, and Probiotic Product (ALIGN PO).  No orders of the defined types were placed in this encounter.   Problem List Items Addressed This Visit      Unprioritized   Atrial fibrillation (HCC)    On xaralto  Per cardiology      Relevant Medications   pravastatin (PRAVACHOL) 40 MG tablet   Essential hypertension    Well controlled, no changes to meds. Encouraged heart healthy diet such as the DASH diet and exercise as tolerated.       Relevant Medications   pravastatin (PRAVACHOL) 40 MG tablet   Other Relevant Orders   Comprehensive metabolic panel   Hyperlipidemia   Relevant Medications   pravastatin (PRAVACHOL) 40 MG tablet    Other Relevant Orders   Lipid panel   Comprehensive metabolic panel   Hyperlipidemia associated with type 2 diabetes mellitus (HCC)    Tolerating statin, encouraged heart healthy diet, avoid trans fats, minimize simple carbs and saturated fats. Increase exercise as tolerated      Relevant Medications   pravastatin (PRAVACHOL) 40 MG tablet   Hypothyroidism    Check labs       Relevant Orders   TSH   Lower extremity edema - Primary    Inc lasix 80 mg 3-4 days then dec to 40mg   Elevate legs Compression socks          Follow-up: Return in  about 4 weeks (around 02/27/2020), or if symptoms worsen or fail to improve.  [name], DO

## 2020-02-13 ENCOUNTER — Other Ambulatory Visit: Payer: Self-pay

## 2020-02-13 ENCOUNTER — Other Ambulatory Visit: Payer: Self-pay | Admitting: *Deleted

## 2020-02-13 NOTE — Patient Outreach (Signed)
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 primary care provider (PCP) providing care of memory changes.  Her home renovations have been completed after 2 months and she had been staying with family during this time.   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 One of her children or sister stay with her at all times  Belinda Anderson provides transportation to medical appointments Belinda Anderson is able to ambulate, feed, dresses, bathe herself but needs assist with allIADLs (instrumental Activities of Daily Living)  Conditions:HTN, Atrial fibrillation, goiter, memory los, diarrhea, syncope , cerebral aneurysm rupture, stroke due to embolism of RMCA, HLD, right leg swelling, arthritis, thyroid disease  GYI:RSWN eyeglasses Consent: [facility] RN CM reviewed [facility] services with patient. Patient gave verbal consent for services [facility] telephonic RN CM  [facility] RN CM will  follow up with the patient in the next 84-91 days  Pt encouraged to return a call to [place] CM prn  [facility] CM Care Plan Problem One     Most Recent Value  Care Plan Problem One  home management of Atrial fibrillation, Stroke, HTN  Role Documenting the Problem One  Care Management Telephonic Coordinator  Care Plan for Problem One  Active  [facility] Long Term Goal   over the next 120 days the patient wil be able to verbalize home management of atrial fibrillation, stroke and hypertension as evidence by verbalizatin during outreach and EMR documentation  [place] Term Goal Start Date  11/15/19  Interventions for Problem One Long Term Goal  assessed home action plans, answered questions, provided support and encouragement  [facility] CM Short Term Goal #1   over the next 90 dayspatient will not experience hospital readmission, as evidence by patient reporting and review of EMR during [facility] RN CCM outreach  [facility] CM Short Term Goal #1 Start Date  11/15/19  [facility] CM Short Term Goal #1 Met Date  02/13/20  Interventions for Short Term Goal #1  assessed home action plans, answered questions, provided support and encouragement      Belinda Kerekes L. Lavina Hamman, RN, BSN, [place] Coordinator Office number [phone] Mobile number [phone]  Main [facility] number [phone] Fax number [phone]

## 2020-02-18 ENCOUNTER — Telehealth: Payer: Self-pay | Admitting: Family Medicine

## 2020-02-18 NOTE — Progress Notes (Signed)
  Chronic Care Management   Note  02/18/2020 Name: [name] MRN: [id number] DOB: [birth year]  Belinda Anderson is a 86 y.o. year old female who is a primary care patient of [name], DO. I reached out to Blase Mess by phone today in response to a referral sent by Belinda Anderson PCP, [name], Grayling Congress, DO.   Belinda Anderson was given information about Chronic Care Management services today including:  1. CCM service includes personalized support from designated clinical staff supervised by her physician, including individualized plan of care and coordination with other care providers 2. 24/7 contact phone numbers for assistance for urgent and routine care needs. 3. Service will only be billed when office clinical staff spend 20 minutes or more in a month to coordinate care. 4. Only one practitioner may furnish and bill the service in a calendar month. 5. The patient may stop CCM services at any time (effective at the end of the month) by phone call to the office staff.   Patient agreed to services and verbal consent obtained.    This note is not being shared with the patient for the following reason: To respect privacy (The patient or proxy has requested that the information not be shared).  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

## 2020-02-27 ENCOUNTER — Ambulatory Visit (INDEPENDENT_AMBULATORY_CARE_PROVIDER_SITE_OTHER): Payer: Medicare Other | Admitting: Family Medicine

## 2020-02-27 ENCOUNTER — Encounter: Payer: Self-pay | Admitting: Family Medicine

## 2020-02-27 ENCOUNTER — Other Ambulatory Visit: Payer: Self-pay

## 2020-02-27 VITALS — BP 118/80 | HR 110 | Temp 97.1°F | Resp 18 | Ht 64.0 in | Wt 145.2 lb

## 2020-02-27 DIAGNOSIS — E785 Hyperlipidemia, unspecified: Secondary | ICD-10-CM

## 2020-02-27 DIAGNOSIS — I10 Essential (primary) hypertension: Secondary | ICD-10-CM | POA: Diagnosis not present

## 2020-02-27 DIAGNOSIS — E039 Hypothyroidism, unspecified: Secondary | ICD-10-CM | POA: Diagnosis not present

## 2020-02-27 MED ORDER — PRAVASTATIN SODIUM 40 MG PO TABS: 40.0000 mg | ORAL_TABLET | Freq: Every day | ORAL | 1 refills | Status: DC

## 2020-02-27 NOTE — Assessment & Plan Note (Signed)
Well controlled, no changes to meds. Encouraged heart healthy diet such as the DASH diet and exercise as tolerated.  °

## 2020-02-27 NOTE — Assessment & Plan Note (Signed)
Lab Results  Component Value Date   TSH 1.20 01/30/2020   con't synthroid  Stable

## 2020-02-27 NOTE — Assessment & Plan Note (Signed)
Encouraged heart healthy diet, increase exercise, avoid trans fats, consider a krill oil cap daily 

## 2020-02-27 NOTE — Patient Instructions (Signed)

## 2020-02-27 NOTE — Progress Notes (Signed)
Patient ID: Belinda Anderson, female    DOB: Nov 22, 1931  Age: 86 y.o. MRN: [id number]    Subjective:  Subjective  HPI Belinda Anderson presents for f/u edema--- it has gotten better .   No other complaints   Her daughter is with her today.    Review of Systems  Constitutional: Negative for appetite change, diaphoresis, fatigue and unexpected weight change.  Eyes: Negative for pain, redness and visual disturbance.  Respiratory: Negative for cough, chest tightness, shortness of breath and wheezing.   Cardiovascular: Positive for leg swelling. Negative for chest pain and palpitations.  Endocrine: Negative for cold intolerance, heat intolerance, polydipsia, polyphagia and polyuria.  Genitourinary: Negative for difficulty urinating, dysuria and frequency.  Neurological: Negative for dizziness, light-headedness, numbness and headaches.    History Past Medical History:  Diagnosis Date  . Arthritis   . Atrial fibrillation (HCC)   . Hypertension   . Thyroid disease   . TIA (transient ischemic attack)     She has a past surgical history that includes Eye surgery; Radiology with anesthesia (N/A, 09/30/2019); IR PERCUTANEOUS ART THROMBECTOMY/INFUSION INTRACRANIAL INC DIAG ANGIO (09/30/2019); IR CT Head Ltd (09/30/2019); and IR ANGIO VERTEBRAL SEL SUBCLAVIAN INNOMINATE UNI R MOD SED (09/30/2019).   Her family history includes Arthritis in her mother; Coronary artery disease in an other family member; Diabetes in her brother; Heart disease in her mother; Heart disease (age of onset: 50) in her father.She reports that she has never smoked. She has never used smokeless tobacco. She reports that she does not drink alcohol or use drugs.  Current Outpatient Medications on File Prior to Visit  Medication Sig Dispense Refill  . acetaminophen (TYLENOL) 650 MG CR tablet Take 1,300 mg by mouth 3 (three) times daily.    Marland Kitchen amLODipine (NORVASC) 2.5 MG tablet TAKE 1 TABLET BY MOUTH ONCE DAILY 90 tablet 3  .  apixaban (ELIQUIS) 2.5 MG TABS tablet Take 1 tablet (2.5 mg total) by mouth 2 (two) times daily. 180 tablet 3  . diclofenac sodium (VOLTAREN) 1 % GEL Apply 4 g topically 4 (four) times daily. 100 g 2  . fluticasone (FLONASE) 50 MCG/ACT nasal spray Place 2 sprays into both nostrils daily. 16 g 12  . furosemide (LASIX) 40 MG tablet Take 1 tablet (40 mg total) by mouth daily. 90 tablet 3  . hydrocortisone (ANUSOL-HC) 2.5 % rectal cream Place 1 application rectally 2 (two) times daily. 30 g 0  . hydrocortisone (ANUSOL-HC) 25 MG suppository Place 1 suppository (25 mg total) rectally 2 (two) times daily. 12 suppository 0  . levothyroxine (SYNTHROID) 75 MCG tablet TAKE 1 TABLET(75 MCG) BY MOUTH DAILY 90 tablet 3  . potassium chloride SA (KLOR-CON) 20 MEQ tablet Take 1 tablet (20 mEq total) by mouth daily. 90 tablet 1  . Probiotic Product (ALIGN PO) Take by mouth.     No current facility-administered medications on file prior to visit.     Objective:  Objective  Physical Exam Vitals and nursing note reviewed.  Constitutional:      Appearance: She is well-developed.  HENT:     Head: Normocephalic and atraumatic.  Eyes:     Conjunctiva/sclera: Conjunctivae normal.  Neck:     Thyroid: No thyromegaly.     Vascular: No carotid bruit or JVD.  Cardiovascular:     Rate and Rhythm: Normal rate and regular rhythm.     Heart sounds: Normal heart sounds. No murmur.  Pulmonary:     Effort: Pulmonary effort is normal. No  respiratory distress.     Breath sounds: Normal breath sounds. No wheezing or rales.  Chest:     Chest wall: No tenderness.  Musculoskeletal:        General: Swelling present.     Cervical back: Normal range of motion and neck supple.     Right lower leg: No swelling. 1+ Pitting Edema present.     Left lower leg: No swelling. Pitting Edema present.       Legs:  Neurological:     Mental Status: She is alert and oriented to person, place, and time.    BP 118/80 (BP Location:  Right Arm, Patient Position: Sitting, Cuff Size: Normal)   Pulse (!) 110   Temp (!) 97.1 F (36.2 C) (Temporal)   Resp 18   Ht 5\' 4"  (1.626 m)   Wt 145 lb 3.2 oz (65.9 kg)   SpO2 98%   BMI 24.92 kg/m  Wt Readings from Last 3 Encounters:  02/27/20 145 lb 3.2 oz (65.9 kg)  01/30/20 147 lb 9.6 oz (67 kg)  12/03/19 145 lb 6.4 oz (66 kg)     Lab Results  Component Value Date   WBC 6.2 12/02/2019   HGB 12.6 12/02/2019   HCT 36.9 12/02/2019   PLT 221 12/02/2019   GLUCOSE 92 01/30/2020   CHOL 117 01/30/2020   TRIG 75.0 01/30/2020   HDL 33.80 (L) 01/30/2020   LDLDIRECT 97.0 04/06/2018   LDLCALC 68 01/30/2020   ALT 16 01/30/2020   AST 21 01/30/2020   NA 140 01/30/2020   K 3.9 01/30/2020   CL 104 01/30/2020   CREATININE 1.64 (H) 01/30/2020   BUN 26 (H) 01/30/2020   CO2 28 01/30/2020   TSH 1.20 01/30/2020   INR 1.0 09/30/2019   HGBA1C 5.9 (H) 10/01/2019    No results found.   Assessment & Plan:  Plan  I have discontinued Belinda Shell "[name]"'s atorvastatin. I have also changed her pravastatin. Additionally, I am having her maintain her diclofenac sodium, acetaminophen, hydrocortisone, hydrocortisone, potassium chloride SA, levothyroxine, furosemide, fluticasone, amLODipine, apixaban, and Probiotic Product (ALIGN PO).  Meds ordered this encounter  Medications  . pravastatin (PRAVACHOL) 40 MG tablet    Sig: Take 1 tablet (40 mg total) by mouth daily.    Dispense:  30 tablet    Refill:  1    Problem List Items Addressed This Visit      Unprioritized   Essential hypertension    Well controlled, no changes to meds. Encouraged heart healthy diet such as the DASH diet and exercise as tolerated.       Relevant Medications   pravastatin (PRAVACHOL) 40 MG tablet   Other Relevant Orders   Ambulatory referral to Chronic Care Management Services   Hyperlipidemia - Primary    Encouraged heart healthy diet, increase exercise, avoid trans fats, consider a krill oil cap  daily      Relevant Medications   pravastatin (PRAVACHOL) 40 MG tablet   Other Relevant Orders   Ambulatory referral to Chronic Care Management Services   Hypothyroidism    Lab Results  Component Value Date   TSH 1.20 01/30/2020   con't synthroid  Stable       Relevant Orders   Ambulatory referral to Chronic Care Management Services      Follow-up: Return in about 6 months (around 08/29/2020) for hypertension, annual exam, fasting.  [name], DO

## 2020-03-31 NOTE — Progress Notes (Signed)
PCP:  [name], DO Primary Cardiologist: No primary care provider on file. Electrophysiologist: Will Jorja Loa, MD   Belinda Anderson is a 86 y.o. female seen today for Will Jorja Loa, MD for routine electrophysiology followup.  Since last being seen in our clinic the patient reports doing poorly. She has had approximately 1 month of SOB and fatigue. Saw PCP last month. No EKG, but HR > 110, so likely had gone into AF at that time. She states she hasn't missed any of her Eliquis, BUT her daughter states even with an alarming pill box she still forgets about 2 doses a week.  She is SOB with exertion, but slowly resolves with rest. Denies chest pain. She has had weight gain and edema. Her PCP increased her lasix for several days and this helped her symptoms, but they have gradually worsened.   Past Medical History:  Diagnosis Date  . Arthritis   . Atrial fibrillation (HCC)   . Hypertension   . Thyroid disease   . TIA (transient ischemic attack)    Past Surgical History:  Procedure Laterality Date  . EYE SURGERY     lens implant  . IR ANGIO VERTEBRAL SEL SUBCLAVIAN INNOMINATE UNI R MOD SED  09/30/2019  . IR CT HEAD LTD  09/30/2019  . IR PERCUTANEOUS ART THROMBECTOMY/INFUSION INTRACRANIAL INC DIAG ANGIO  09/30/2019  . RADIOLOGY WITH ANESTHESIA N/A 09/30/2019   Procedure: IR WITH ANESTHESIA;  Surgeon: Julieanne Cotton, MD;  Location: [facility] OR;  Service: Radiology;  Laterality: N/A;    Current Outpatient Medications  Medication Sig Dispense Refill  . acetaminophen (TYLENOL) 650 MG CR tablet Take 1,300 mg by mouth as needed for pain.     Marland Kitchen amLODipine (NORVASC) 2.5 MG tablet TAKE 1 TABLET BY MOUTH ONCE DAILY 90 tablet 3  . apixaban (ELIQUIS) 2.5 MG TABS tablet Take 1 tablet (2.5 mg total) by mouth 2 (two) times daily. 180 tablet 3  . diclofenac Sodium (VOLTAREN) 1 % GEL Apply topically as needed.    . furosemide (LASIX) 40 MG tablet Take 1 tablet (40 mg total) by  mouth daily. 90 tablet 3  . hydrocortisone (ANUSOL-HC) 2.5 % rectal cream Place 1 application rectally 2 (two) times daily. (Patient taking differently: Place 1 application rectally as needed. ) 30 g 0  . levothyroxine (SYNTHROID) 75 MCG tablet TAKE 1 TABLET(75 MCG) BY MOUTH DAILY 90 tablet 3  . potassium chloride SA (KLOR-CON) 20 MEQ tablet Take 1 tablet (20 mEq total) by mouth daily. 90 tablet 1  . pravastatin (PRAVACHOL) 40 MG tablet Take 1 tablet (40 mg total) by mouth daily. 30 tablet 1  . psyllium (METAMUCIL) 0.52 g capsule Take 0.52 g by mouth daily.     No current facility-administered medications for this visit.    No Known Allergies  Social History   Socioeconomic History  . Marital status: Widowed    Spouse name: Not on file  . Number of children: 3  . Years of education: Not on file  . Highest education level: Not on file  Occupational History  . Not on file  Tobacco Use  . Smoking status: Never Smoker  . Smokeless tobacco: Never Used  Vaping Use  . Vaping Use: Never used  Substance and Sexual Activity  . Alcohol use: No  . Drug use: No  . Sexual activity: Not Currently    Partners: Male  Other Topics Concern  . Not on file  Social History Narrative  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, TEFL teacher   Social Determinants of Health   Financial Resource Strain:   . Difficulty of Paying Living Expenses:   Food Insecurity:   . Worried About Charity fundraiser in the Last Year:   . Arboriculturist in the Last Year:   Transportation Needs: No Transportation Needs  . Lack of Transportation (Medical): No  . Lack of Transportation (Non-Medical): No  Physical Activity:   . Days of Exercise per Week:   . Minutes of Exercise per Session:   Stress:   . Feeling of Stress :   Social Connections:   . Frequency of Communication with Friends and Family:   . Frequency of Social Gatherings with Friends and Family:   . Attends Religious Services:   .  Active Member of Clubs or Organizations:   . Attends Archivist Meetings:   Marland Kitchen Marital Status:   Intimate Partner Violence:   . Fear of Current or Ex-Partner:   . Emotionally Abused:   Marland Kitchen Physically Abused:   . Sexually Abused:      Review of Systems: General: No chills, fever, night sweats or weight changes  Cardiovascular:  No chest pain, dyspnea on exertion, edema, orthopnea, palpitations, paroxysmal nocturnal dyspnea Dermatological: No rash, lesions or masses Respiratory: No cough, dyspnea Urologic: No hematuria, dysuria Abdominal: No nausea, vomiting, diarrhea, bright red blood per rectum, melena, or hematemesis Neurologic: No visual changes, weakness, changes in mental status All other systems reviewed and are otherwise negative except as noted above.  Physical Exam: Vitals:   04/01/20 0953  BP: 104/68  Pulse: (!) 102  SpO2: 97%  Weight: 151 lb (68.5 kg)  Height: 5\' 4"  (1.626 m)    GEN- The patient is well appearing, alert and oriented x 3 today.   HEENT: normocephalic, atraumatic; sclera clear, conjunctiva pink; hearing intact; oropharynx clear; neck supple, no JVP Lymph- no cervical lymphadenopathy Lungs- Clear to ausculation bilaterally, normal work of breathing.  No wheezes, rales, rhonchi Heart- Regular rate and rhythm, no murmurs, rubs or gallops, PMI not laterally displaced GI- soft, non-tender, non-distended, bowel sounds present, no hepatosplenomegaly Extremities- no clubbing, cyanosis, or edema; DP/PT/radial pulses 2+ bilaterally MS- no significant deformity or atrophy Skin- warm and dry, no rash or lesion Psych- euthymic mood, full affect Neuro- strength and sensation are intact  EKG is ordered. Shows AF with RVR at 121 bpm.   Additional studies reviewed include: Previous office notes, Previous EP notes, recent labwork, Previous echo  Assessment and Plan:  1. Paroxysmal atrial fibrillation, now persistent Continue eliquis for CHA2DS2VASC of  at least 7   She is currently in AF RVR and symptomatic Start diltiazem 120 mg daily. Stressed importance of eliquis compliance. Her daughters are going to help her with this.  Will plan DCCV/TEE with missed doses of Eliquis.  Could consider AAD, but will not start now with doses of eliquis missed. She has a normal EF and no known CAD.  2. Cryptogenic stroke Thought 2/2 AF.  Now on eliquis as above.  No further.   3. Post COVID fatigue Had improved, now confounded in setting of recurrent AF.  Will schedule AF clinic 1 week post TEE/DCCV to discuss AAD and re-assess.  Shirley Friar, PA-C  04/01/20 10:10 AM

## 2020-04-01 ENCOUNTER — Other Ambulatory Visit: Payer: Self-pay

## 2020-04-01 ENCOUNTER — Encounter: Payer: Self-pay | Admitting: Student

## 2020-04-01 ENCOUNTER — Ambulatory Visit: Payer: Medicare Other | Admitting: Student

## 2020-04-01 VITALS — BP 104/68 | HR 102 | Ht 64.0 in | Wt 151.0 lb

## 2020-04-01 DIAGNOSIS — I10 Essential (primary) hypertension: Secondary | ICD-10-CM

## 2020-04-01 DIAGNOSIS — I4819 Other persistent atrial fibrillation: Secondary | ICD-10-CM

## 2020-04-01 DIAGNOSIS — M7989 Other specified soft tissue disorders: Secondary | ICD-10-CM

## 2020-04-01 DIAGNOSIS — I63411 Cerebral infarction due to embolism of right middle cerebral artery: Secondary | ICD-10-CM

## 2020-04-01 LAB — CBC WITH DIFFERENTIAL/PLATELET
Basophils Absolute: 0 10*3/uL (ref 0.0–0.2)
Basos: 0 %
EOS (ABSOLUTE): 0.1 10*3/uL (ref 0.0–0.4)
Eos: 1 %
Hematocrit: 38 % (ref 34.0–46.6)
Hemoglobin: 12.1 g/dL (ref 11.1–15.9)
Immature Grans (Abs): 0 10*3/uL (ref 0.0–0.1)
Immature Granulocytes: 0 %
Lymphocytes Absolute: 1.4 10*3/uL (ref 0.7–3.1)
Lymphs: 23 %
MCH: 28.4 pg (ref 26.6–33.0)
MCHC: 31.8 g/dL (ref 31.5–35.7)
MCV: 89 fL (ref 79–97)
Monocytes Absolute: 0.9 10*3/uL (ref 0.1–0.9)
Monocytes: 14 %
Neutrophils Absolute: 3.9 10*3/uL (ref 1.4–7.0)
Neutrophils: 62 %
Platelets: 221 10*3/uL (ref 150–450)
RBC: 4.26 x10E6/uL (ref 3.77–5.28)
RDW: 13.5 % (ref 11.7–15.4)
WBC: 6.3 10*3/uL (ref 3.4–10.8)

## 2020-04-01 LAB — BASIC METABOLIC PANEL
BUN/Creatinine Ratio: 15 (ref 12–28)
BUN: 23 mg/dL (ref 8–27)
CO2: 24 mmol/L (ref 20–29)
Calcium: 9 mg/dL (ref 8.7–10.3)
Chloride: 101 mmol/L (ref 96–106)
Creatinine, Ser: 1.58 mg/dL — ABNORMAL HIGH (ref 0.57–1.00)
GFR calc Af Amer: 34 mL/min/{1.73_m2} — ABNORMAL LOW (ref >59)
GFR calc non Af Amer: 30 mL/min/{1.73_m2} — ABNORMAL LOW (ref >59)
Glucose: 76 mg/dL (ref 65–99)
Potassium: 4 mmol/L (ref 3.5–5.2)
Sodium: 143 mmol/L (ref 134–144)

## 2020-04-01 LAB — MAGNESIUM: Magnesium: 2.3 mg/dL (ref 1.6–2.3)

## 2020-04-01 MED ORDER — FUROSEMIDE 20 MG PO TABS: ORAL_TABLET | ORAL | 3 refills | Status: DC

## 2020-04-01 MED ORDER — DILTIAZEM HCL ER COATED BEADS 120 MG PO CP24
120.0000 mg | ORAL_CAPSULE | Freq: Every day | ORAL | 3 refills | Status: DC
Start: 2020-04-01 — End: 2020-04-15

## 2020-04-01 MED ORDER — FUROSEMIDE 20 MG PO TABS: ORAL_TABLET | ORAL | 1 refills | Status: DC

## 2020-04-01 NOTE — Patient Instructions (Addendum)
Medication Instructions:  - Start Diltiazem 120 mg - Take 1 tablet by mouth daily - INCREASE Furosemide (Lasix) 40 mg - Take 2 tablets (40 mg) by mouth daily,  You make take an addition 20 mg tablet as needed for increased swelling  *If you need a refill on your cardiac medications before your next appointment, please call your pharmacy*  Lab Work: Your physician has recommended that you have lab work today:  BMET, CBC, Mag  If you have labs (blood work) drawn today and your tests are completely normal, you will receive your results only by: Marland Kitchen MyChart Message (if you have MyChart) OR . A paper copy in the mail If you have any lab test that is abnormal or we need to change your treatment, we will call you to review the results.   Testing/Procedures: You will need to have a pre-procedure covid test on 04/02/20 at 9:50 am. Please report to [facility], the [facility].  Your physician has requested that you have a TEE/Cardioversion. During a TEE, sound waves are used to create images of your heart. It provides your doctor with information about the size and shape of your heart and how well your heart's chambers and valves are working. In this test, a transducer is attached to the end of a flexible tube that is guided down you throat and into your esophagus (the tube leading from your mouth to your stomach) to get a more detailed image of your heart. Once the TEE has determined that a blood clot is not present, the cardioversion begins. Electrical Cardioversion uses a jolt of electricity to your heart either through paddles or wired patches attached to your chest. This is a controlled, usually prescheduled, procedure. This procedure is done at the hospital and you are not awake during the procedure. You usually go home the day of the procedure. Please see the instruction sheet given to you today for more information.    Follow-Up: At [facility], you and your health  needs are our priority.  As part of our continuing mission to provide you with exceptional heart care, we have created designated Provider Care Teams.  These Care Teams include your primary Cardiologist (physician) and Advanced Practice Providers (APPs -  Physician Assistants and Nurse Practitioners) who all work together to provide you with the care you need, when you need it.  We recommend signing up for the patient portal called "MyChart".  Sign up information is provided on this After Visit Summary.  MyChart is used to connect with patients for Virtual Visits (Telemedicine).  Patients are able to view lab/test results, encounter notes, upcoming appointments, etc.  Non-urgent messages can be sent to your provider as well.   To learn more about what you can do with MyChart, go to [email or web address].    Your next appointment:    Your physician recommends that you schedule a follow-up appointment 1 week after TEE Cardoversion with Sebastian Ache, NP in the Afib clinic.   The format for your next appointment:   In Person  Provider:   Sebastian Ache, NP-C   Other Instructions  Your provider has recommended a TEE Cardioversion.   You are scheduled for a cardioversion on Monday 04/06/20 at 11:00 am with Dr. Jacques Navy or associates.  Please go to [facility] Short Stay at 10:00 am.   Enter through the [facility] A  Do not have any food or drink after midnight on Sunday 04/05/20.  Do NOT TAKE your Lasix the morning of your procedure, you may take your medicines with a sip of water on the day of your procedure.   You will need someone to drive you home following your procedure.   Call the [place] office at [phone] if you have any questions, problems or concerns.     Electrical Cardioversion Electrical cardioversion is the delivery of a jolt of electricity to change the rhythm of the heart. Sticky patches or metal paddles are placed  on the chest to deliver the electricity from a device. This is done to restore a normal rhythm. A rhythm that is too fast or not regular keeps the heart from pumping well. Electrical cardioversion is done in an emergency if:   There is low or no blood pressure as a result of the heart rhythm.   Normal rhythm must be restored as fast as possible to protect the brain and heart from further damage.   It may save a life. Cardioversion may be done for heart rhythms that are not immediately life threatening, such as atrial fibrillation or flutter, in which:   The heart is beating too fast or is not regular.   Medicine to change the rhythm has not worked.   It is safe to wait in order to allow time for preparation.  Symptoms of the abnormal rhythm are bothersome.  The risk of stroke and other serious problems can be reduced.  LET [facility] CARE PROVIDER KNOW ABOUT:   Any allergies you have.  All medicines you are taking, including vitamins, herbs, eye drops, creams, and over-the-counter medicines.  Previous problems you or members of your family have had with the use of anesthetics.   Any blood disorders you have.   Previous surgeries you have had.   Medical conditions you have.   RISKS AND COMPLICATIONS  Generally, this is a safe procedure. However, problems can occur and include:   Breathing problems related to the anesthetic used.  A blood clot that breaks free and travels to other parts of your body. This could cause a stroke or other problems. The risk of this is lowered by use of blood-thinning medicine (anticoagulant) prior to the procedure.  Cardiac arrest (rare).   BEFORE THE PROCEDURE   You may have tests to detect blood clots in your heart and to evaluate heart function.  You may start taking anticoagulants so your blood does not clot as easily.   Medicines may be given to help stabilize your heart rate and rhythm.   PROCEDURE  You will be given  medicine through an IV tube to reduce discomfort and make you sleepy (sedative).   An electrical shock will be delivered.   AFTER THE PROCEDURE Your heart rhythm will be watched to make sure it does not change. You will need someone to drive you home

## 2020-04-02 ENCOUNTER — Telehealth: Payer: Medicare Other

## 2020-04-02 ENCOUNTER — Other Ambulatory Visit (HOSPITAL_COMMUNITY)
Admission: RE | Admit: 2020-04-02 | Discharge: 2020-04-02 | Disposition: A | Discharge status: Home / Self Care | Payer: Medicare Other | Source: Ambulatory Visit | Attending: Internal Medicine | Admitting: Internal Medicine

## 2020-04-02 DIAGNOSIS — Z20822 Contact with and (suspected) exposure to covid-19: Secondary | ICD-10-CM | POA: Diagnosis not present

## 2020-04-02 DIAGNOSIS — Z01812 Encounter for preprocedural laboratory examination: Secondary | ICD-10-CM | POA: Insufficient documentation

## 2020-04-02 LAB — SARS CORONAVIRUS 2 (TAT 6-24 HRS): SARS Coronavirus 2: NEGATIVE

## 2020-04-06 ENCOUNTER — Encounter (HOSPITAL_COMMUNITY)
Admission: RE | Disposition: A | Discharge status: Home / Self Care | Payer: Self-pay | Source: Home / Self Care | Attending: Internal Medicine

## 2020-04-06 ENCOUNTER — Encounter (HOSPITAL_COMMUNITY): Payer: Self-pay | Admitting: Internal Medicine

## 2020-04-06 ENCOUNTER — Ambulatory Visit (HOSPITAL_COMMUNITY): Payer: Medicare Other

## 2020-04-06 ENCOUNTER — Other Ambulatory Visit: Payer: Self-pay

## 2020-04-06 ENCOUNTER — Ambulatory Visit (HOSPITAL_COMMUNITY)
Admission: RE | Admit: 2020-04-06 | Discharge: 2020-04-06 | Disposition: A | Discharge status: Home / Self Care | Payer: Medicare Other | Attending: Internal Medicine | Admitting: Internal Medicine

## 2020-04-06 DIAGNOSIS — I4819 Other persistent atrial fibrillation: Secondary | ICD-10-CM | POA: Insufficient documentation

## 2020-04-06 DIAGNOSIS — Z5309 Procedure and treatment not carried out because of other contraindication: Secondary | ICD-10-CM | POA: Insufficient documentation

## 2020-04-06 SURGERY — CANCELLED PROCEDURE

## 2020-04-06 NOTE — CV Procedure (Signed)
The patient was scheduled for TEE/DCCV today. She missed her dose of Eliquis last night. We discussed risks of missed doses of anticoagulation in the peri-procedural period with her daughter [name] present. We participated in shared decision making and determined that procedure may incur additional risk with inconsistent use of anticoagulation. We decided not to perform the procedure today. Otilio Saber in EP notified.

## 2020-04-06 NOTE — Progress Notes (Signed)
TEE/cardioversion canceled, patient misses last night dose of Eliquis. Dr.Acharya aware and she visited with patient and her daughter Belinda Anderson

## 2020-04-08 ENCOUNTER — Other Ambulatory Visit: Payer: Self-pay

## 2020-04-08 ENCOUNTER — Telehealth: Payer: Self-pay | Admitting: Family Medicine

## 2020-04-08 ENCOUNTER — Ambulatory Visit (HOSPITAL_COMMUNITY)
Admission: RE | Admit: 2020-04-08 | Discharge: 2020-04-08 | Disposition: A | Discharge status: Home / Self Care | Payer: Medicare Other | Source: Ambulatory Visit | Attending: Nurse Practitioner | Admitting: Nurse Practitioner

## 2020-04-08 VITALS — BP 116/56 | HR 77 | Ht 64.0 in | Wt 155.6 lb

## 2020-04-08 DIAGNOSIS — I110 Hypertensive heart disease with heart failure: Secondary | ICD-10-CM | POA: Insufficient documentation

## 2020-04-08 DIAGNOSIS — Z8673 Personal history of transient ischemic attack (TIA), and cerebral infarction without residual deficits: Secondary | ICD-10-CM | POA: Insufficient documentation

## 2020-04-08 DIAGNOSIS — I700 Atherosclerosis of aorta: Secondary | ICD-10-CM | POA: Insufficient documentation

## 2020-04-08 DIAGNOSIS — Z79890 Hormone replacement therapy (postmenopausal): Secondary | ICD-10-CM | POA: Insufficient documentation

## 2020-04-08 DIAGNOSIS — Z8249 Family history of ischemic heart disease and other diseases of the circulatory system: Secondary | ICD-10-CM | POA: Insufficient documentation

## 2020-04-08 DIAGNOSIS — I517 Cardiomegaly: Secondary | ICD-10-CM | POA: Insufficient documentation

## 2020-04-08 DIAGNOSIS — I5030 Unspecified diastolic (congestive) heart failure: Secondary | ICD-10-CM | POA: Diagnosis not present

## 2020-04-08 DIAGNOSIS — Z7901 Long term (current) use of anticoagulants: Secondary | ICD-10-CM | POA: Insufficient documentation

## 2020-04-08 DIAGNOSIS — Z79899 Other long term (current) drug therapy: Secondary | ICD-10-CM | POA: Diagnosis not present

## 2020-04-08 DIAGNOSIS — D6869 Other thrombophilia: Secondary | ICD-10-CM

## 2020-04-08 DIAGNOSIS — I358 Other nonrheumatic aortic valve disorders: Secondary | ICD-10-CM | POA: Diagnosis not present

## 2020-04-08 DIAGNOSIS — I4819 Other persistent atrial fibrillation: Secondary | ICD-10-CM

## 2020-04-08 DIAGNOSIS — Z91130 Patient's unintentional underdosing of medication regimen due to age-related debility: Secondary | ICD-10-CM | POA: Insufficient documentation

## 2020-04-08 DIAGNOSIS — E079 Disorder of thyroid, unspecified: Secondary | ICD-10-CM | POA: Diagnosis not present

## 2020-04-08 MED ORDER — RIVAROXABAN 15 MG PO TABS
15.0000 mg | ORAL_TABLET | Freq: Every day | ORAL | 3 refills | Status: DC
Start: 2020-04-08 — End: 2020-07-13

## 2020-04-08 NOTE — Progress Notes (Signed)
°  Chronic Care Management   Outreach Note  04/08/2020 Name: Trinaty Bundrick MRN: [id number] DOB: 1931/12/24  Referred by: [name], DO Reason for referral : No chief complaint on file.   An unsuccessful telephone outreach was attempted today. The patient was referred to the pharmacist for assistance with care management and care coordination. This note is not being shared with the patient for the following reason: To respect privacy (The patient or proxy has requested that the information not be shared).  Follow Up Plan:   Lynnae January Upstream Scheduler

## 2020-04-08 NOTE — H&P (View-Only) (Signed)
Primary Care Physician: [name], [name], DO Referring Physician:Dr. Allona Gondek is a 86 y.o. 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 She  was started on eliquis 5 mg bid. She was back in rhythm at d/c. She recently became more symptomatic with LLE and exertional shortness of breath and was found to be in afib with Oda Kilts, PA, 6/16. She had missed some doses of her PM Eliquis so a TEE DCCV was scheduled. At the time of cardioversion, she had missed the pm dose of eliquis so the procedure was cancelled.   Today in the office, 6/23, she is in afib at 77 bpm. The daughter states that her mother has trouble with missing the pm dose of eliquis. She takes all of her other meds in the am and this is the only one that she misses because  it is by itself in the pm. It had been discussed to change to xarelto to keep from  missing doses. SHe eats a fairly sizeable breakfast so this would be ok to take in the am. We discussed antiarrythmic's, mostly amiodarone, do  not think she is releiable enough to take tikosyn. She  lives alone , has mild memory issues and her daughter's  help to supervise her. The daughter is leary of another new med at this time so will try the TEE guided cardioversion after 5 doses of xarelto 15 mg daily. She will start in the am. Daughter will remove eliquis from  the pill box and her house.   Today, she denies symptoms of palpitations, chest pain, shortness of breath, orthopnea, PND, lower extremity edema, dizziness, presyncope, syncope, or neurologic sequela. The patient is tolerating medications without difficulties and is otherwise without complaint today.   Past Medical History:  Diagnosis Date  . Arthritis   . Atrial fibrillation ([place])   . Hypertension   . Thyroid disease   . TIA (transient ischemic attack)    Past Surgical History:  Procedure  Laterality Date  . EYE SURGERY     lens implant  . IR ANGIO VERTEBRAL SEL SUBCLAVIAN INNOMINATE UNI R MOD SED  09/30/2019  . IR CT HEAD LTD  09/30/2019  . IR PERCUTANEOUS ART THROMBECTOMY/INFUSION INTRACRANIAL INC DIAG ANGIO  09/30/2019  . RADIOLOGY WITH ANESTHESIA N/A 09/30/2019   Procedure: IR WITH ANESTHESIA;  Surgeon: Luanne Bras, MD;  Location: [place];  Service: Radiology;  Laterality: N/A;    Current Outpatient Medications  Medication Sig Dispense Refill  . acetaminophen (TYLENOL) 650 MG CR tablet Take 1,300 mg by mouth daily as needed for pain (arthritis).     Marland Kitchen amLODipine (NORVASC) 2.5 MG tablet TAKE 1 TABLET BY MOUTH ONCE DAILY (Patient taking differently: Take 2.5 mg by mouth daily. ) 90 tablet 3  . diclofenac Sodium (VOLTAREN) 1 % GEL Apply 2 g topically daily as needed (pain).     Marland Kitchen diltiazem (CARDIZEM CD) 120 MG 24 hr capsule Take 1 capsule (120 mg total) by mouth daily. 90 capsule 3  . fluticasone (FLONASE) 50 MCG/ACT nasal spray Place 2 sprays into both nostrils daily as needed for allergies or rhinitis.     . furosemide (LASIX) 20 MG tablet Take 2 tablets (40mg ) by mouth daily, may take and additional 20 mg as needed for swelling and edema 270 tablet 3  . Glycerin-Hypromellose-PEG 400 ([employer] DRY EYE RELIEF) 0.2-0.2-1 %  SOLN Place 1 drop into both eyes daily as needed (Dry eye).    . hydrocortisone (ANUSOL-HC) 2.5 % rectal cream Place 1 application rectally daily as needed for hemorrhoids or anal itching.     . levothyroxine (SYNTHROID) 75 MCG tablet TAKE 1 TABLET(75 MCG) BY MOUTH DAILY (Patient taking differently: Take 75 mcg by mouth daily before breakfast. ) 90 tablet 3  . potassium chloride SA (KLOR-CON) 20 MEQ tablet Take 1 tablet (20 mEq total) by mouth daily. 90 tablet 1  . pravastatin (PRAVACHOL) 40 MG tablet Take 1 tablet (40 mg total) by mouth daily. 30 tablet 1  . psyllium (METAMUCIL) 0.52 g capsule Take 0.52 g by mouth daily.    . Rivaroxaban (XARELTO) 15 MG  TABS tablet Take 1 tablet (15 mg total) by mouth daily with breakfast. 30 tablet 3   No current facility-administered medications for this encounter.    No Known Allergies  Social History   Socioeconomic History  . Marital status: Widowed    Spouse name: Not on file  . Number of children: 3  . Years of education: Not on file  . Highest education level: Not on file  Occupational History  . Not on file  Tobacco Use  . Smoking status: Never Smoker  . Smokeless tobacco: Never Used  Vaping Use  . Vaping Use: Never used  Substance and Sexual Activity  . Alcohol use: No  . Drug use: No  . Sexual activity: Not Currently    Partners: Male  Other Topics Concern  . 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 Difficulty of Paying Living Expenses:   Food Insecurity:   . Worried About Programme researcher, broadcasting/film/video in the Last Year:   . Barista in the Last Year:   Transportation Needs: No Transportation Needs  . Lack of Transportation (Medical): No  . Lack of Transportation (Non-Medical): No  Physical Activity:   . Days of Exercise per Week:   . Minutes of Exercise per Session:   Stress:   . Feeling of Stress :   Social Connections:   . Frequency of Communication with Friends and Family:   . Frequency of Social Gatherings with Friends and Family:   . Attends Religious Services:   . Active Member of Clubs or Organizations:   . Attends Banker Meetings:   Marland Kitchen Marital Status:   Intimate Partner Violence:   . Fear of Current or Ex-Partner:   . Emotionally Abused:   Marland Kitchen Physically Abused:   . Sexually Abused:     Family History  Problem Relation Age of Onset  . Arthritis Mother   . Heart disease Mother   . Heart disease Father 60       MI  . Coronary artery disease Other   . Diabetes Brother        DI    ROS- All systems are reviewed  and negative except as per the HPI above  Physical Exam: Vitals:   04/08/20 1421  BP: (!) 116/56  Pulse: 77  Weight: 70.6 kg  Height: 5\' 4"  (1.626 m)   Wt Readings from Last 3 Encounters:  04/08/20 70.6 kg  04/06/20 68.5 kg  04/01/20 68.5 kg    Labs: Lab Results  Component Value Date   NA 143 04/01/2020   K 4.0 04/01/2020  CL 101 04/01/2020   CO2 24 04/01/2020   GLUCOSE 76 04/01/2020   BUN 23 04/01/2020   CREATININE 1.58 (H) 04/01/2020   CALCIUM 9.0 04/01/2020   MG 2.3 04/01/2020   Lab Results  Component Value Date   INR 1.0 09/30/2019   Lab Results  Component Value Date   CHOL 117 01/30/2020   HDL 33.80 (L) 01/30/2020   LDLCALC 68 01/30/2020   TRIG 75.0 01/30/2020     GEN- The patient is well appearing, alert and oriented x 3 today.   Head- normocephalic, atraumatic Eyes-  Sclera clear, conjunctiva pink Ears- hearing intact Oropharynx- clear Neck- supple, no JVP Lymph- no cervical lymphadenopathy Lungs- Clear to ausculation bilaterally, normal work of breathing Heart- irregular rate and rhythm, no murmurs, rubs or gallops, PMI not laterally displaced GI- soft, NT, ND, + BS Extremities- no clubbing, cyanosis, or  Trace on left, 1+ on right  MS- no significant deformity or atrophy Skin- no rash or lesion Psych- euthymic mood, full affect Neuro- strength and sensation are intact  EKG-afib at 77 bpm, qrs int 112 ms, qtc 359 ms Echo-1. Left ventricular ejection fraction, by visual estimation, is 65 to 70%. The left ventricle has normal function. There is moderately increased left ventricular hypertrophy. 2. Small left ventricular internal cavity size. 3. The left ventricle has no regional wall motion abnormalities. 4. Global right ventricle has normal systolic function.The right ventricular size is normal. No increase in right ventricular wall thickness. 5. Left atrial size was moderately dilated. 6. Right atrial size was normal. 7. The mitral valve is  normal in structure. Mild mitral valve regurgitation. 8. The tricuspid valve is normal in structure. Tricuspid valve regurgitation is mild. 9. The aortic valve is tricuspid. Aortic valve regurgitation is not visualized. Mild to moderate aortic valve sclerosis/calcification without any evidence of aortic stenosis. 10. Moderate calcifications of AV leaflets. No stenosis. 11. The pulmonic valve was not well visualized. Pulmonic valve regurgitation is not visualized. 12. Mildly elevated pulmonary artery systolic pressure   Assessment and Plan: 1. Persistent afib discussed with daughter and pt Memory  issues with taking pm doses  Will switch to xarelto 15 mg daily at breakfast and stop eliquis for [id number] score of at least 6 Daughter is hesitant to start a new drug to restore SR as not to confuse pt further for now Therefore, will proceed with TEE guided cardioversion after 5 doses of xarelto Bmet/cbc done on 6/16 Covid test Has had both shots and had  covid earlier in the year  2. HTN Stable   3. Diastolic HF On lasix Daughter gives an extra 20 mg for increased pedal edema She plans to give an extra 20 this pm  Edema and shortness of breath mor noticeable in afib    Zafirah Vanzee C. Zanyah Lentsch, ANP-C Afib Clinic [facility] [address] , [place] [address] [phone]  

## 2020-04-08 NOTE — Progress Notes (Signed)
Primary Care Physician: [name], [name], DO Referring Physician:Dr. Allona Gondek is a 86 y.o. 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 She  was started on eliquis 5 mg bid. She was back in rhythm at d/c. She recently became more symptomatic with LLE and exertional shortness of breath and was found to be in afib with Oda Kilts, PA, 6/16. She had missed some doses of her PM Eliquis so a TEE DCCV was scheduled. At the time of cardioversion, she had missed the pm dose of eliquis so the procedure was cancelled.   Today in the office, 6/23, she is in afib at 77 bpm. The daughter states that her mother has trouble with missing the pm dose of eliquis. She takes all of her other meds in the am and this is the only one that she misses because  it is by itself in the pm. It had been discussed to change to xarelto to keep from  missing doses. SHe eats a fairly sizeable breakfast so this would be ok to take in the am. We discussed antiarrythmic's, mostly amiodarone, do  not think she is releiable enough to take tikosyn. She  lives alone , has mild memory issues and her daughter's  help to supervise her. The daughter is leary of another new med at this time so will try the TEE guided cardioversion after 5 doses of xarelto 15 mg daily. She will start in the am. Daughter will remove eliquis from  the pill box and her house.   Today, she denies symptoms of palpitations, chest pain, shortness of breath, orthopnea, PND, lower extremity edema, dizziness, presyncope, syncope, or neurologic sequela. The patient is tolerating medications without difficulties and is otherwise without complaint today.   Past Medical History:  Diagnosis Date  . Arthritis   . Atrial fibrillation ([place])   . Hypertension   . Thyroid disease   . TIA (transient ischemic attack)    Past Surgical History:  Procedure  Laterality Date  . EYE SURGERY     lens implant  . IR ANGIO VERTEBRAL SEL SUBCLAVIAN INNOMINATE UNI R MOD SED  09/30/2019  . IR CT HEAD LTD  09/30/2019  . IR PERCUTANEOUS ART THROMBECTOMY/INFUSION INTRACRANIAL INC DIAG ANGIO  09/30/2019  . RADIOLOGY WITH ANESTHESIA N/A 09/30/2019   Procedure: IR WITH ANESTHESIA;  Surgeon: Luanne Bras, MD;  Location: [place];  Service: Radiology;  Laterality: N/A;    Current Outpatient Medications  Medication Sig Dispense Refill  . acetaminophen (TYLENOL) 650 MG CR tablet Take 1,300 mg by mouth daily as needed for pain (arthritis).     Marland Kitchen amLODipine (NORVASC) 2.5 MG tablet TAKE 1 TABLET BY MOUTH ONCE DAILY (Patient taking differently: Take 2.5 mg by mouth daily. ) 90 tablet 3  . diclofenac Sodium (VOLTAREN) 1 % GEL Apply 2 g topically daily as needed (pain).     Marland Kitchen diltiazem (CARDIZEM CD) 120 MG 24 hr capsule Take 1 capsule (120 mg total) by mouth daily. 90 capsule 3  . fluticasone (FLONASE) 50 MCG/ACT nasal spray Place 2 sprays into both nostrils daily as needed for allergies or rhinitis.     . furosemide (LASIX) 20 MG tablet Take 2 tablets (40mg ) by mouth daily, may take and additional 20 mg as needed for swelling and edema 270 tablet 3  . Glycerin-Hypromellose-PEG 400 ([facility] DRY EYE RELIEF) 0.2-0.2-1 %  SOLN Place 1 drop into both eyes daily as needed (Dry eye).    . hydrocortisone (ANUSOL-HC) 2.5 % rectal cream Place 1 application rectally daily as needed for hemorrhoids or anal itching.     . levothyroxine (SYNTHROID) 75 MCG tablet TAKE 1 TABLET(75 MCG) BY MOUTH DAILY (Patient taking differently: Take 75 mcg by mouth daily before breakfast. ) 90 tablet 3  . potassium chloride SA (KLOR-CON) 20 MEQ tablet Take 1 tablet (20 mEq total) by mouth daily. 90 tablet 1  . pravastatin (PRAVACHOL) 40 MG tablet Take 1 tablet (40 mg total) by mouth daily. 30 tablet 1  . psyllium (METAMUCIL) 0.52 g capsule Take 0.52 g by mouth daily.    . Rivaroxaban (XARELTO) 15 MG  TABS tablet Take 1 tablet (15 mg total) by mouth daily with breakfast. 30 tablet 3   No current facility-administered medications for this encounter.    No Known Allergies  Social History   Socioeconomic History  . Marital status: Widowed    Spouse name: Not on file  . Number of children: 3  . Years of education: Not on file  . Highest education level: Not on file  Occupational History  . Not on file  Tobacco Use  . Smoking status: Never Smoker  . Smokeless tobacco: Never Used  Vaping Use  . Vaping Use: Never used  Substance and Sexual Activity  . Alcohol use: No  . Drug use: No  . Sexual activity: Not Currently    Partners: Male  Other Topics Concern  . 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 Difficulty of Paying Living Expenses:   Food Insecurity:   . Worried About Programme researcher, broadcasting/film/video in the Last Year:   . Barista in the Last Year:   Transportation Needs: No Transportation Needs  . Lack of Transportation (Medical): No  . Lack of Transportation (Non-Medical): No  Physical Activity:   . Days of Exercise per Week:   . Minutes of Exercise per Session:   Stress:   . Feeling of Stress :   Social Connections:   . Frequency of Communication with Friends and Family:   . Frequency of Social Gatherings with Friends and Family:   . Attends Religious Services:   . Active Member of Clubs or Organizations:   . Attends Banker Meetings:   Marland Kitchen Marital Status:   Intimate Partner Violence:   . Fear of Current or Ex-Partner:   . Emotionally Abused:   Marland Kitchen Physically Abused:   . Sexually Abused:     Family History  Problem Relation Age of Onset  . Arthritis Mother   . Heart disease Mother   . Heart disease Father 60       MI  . Coronary artery disease Other   . Diabetes Brother        DI    ROS- All systems are reviewed  and negative except as per the HPI above  Physical Exam: Vitals:   04/08/20 1421  BP: (!) 116/56  Pulse: 77  Weight: 70.6 kg  Height: 5\' 4"  (1.626 m)   Wt Readings from Last 3 Encounters:  04/08/20 70.6 kg  04/06/20 68.5 kg  04/01/20 68.5 kg    Labs: Lab Results  Component Value Date   NA 143 04/01/2020   K 4.0 04/01/2020  CL 101 04/01/2020   CO2 24 04/01/2020   GLUCOSE 76 04/01/2020   BUN 23 04/01/2020   CREATININE 1.58 (H) 04/01/2020   CALCIUM 9.0 04/01/2020   MG 2.3 04/01/2020   Lab Results  Component Value Date   INR 1.0 09/30/2019   Lab Results  Component Value Date   CHOL 117 01/30/2020   HDL 33.80 (L) 01/30/2020   LDLCALC 68 01/30/2020   TRIG 75.0 01/30/2020     GEN- The patient is well appearing, alert and oriented x 3 today.   Head- normocephalic, atraumatic Eyes-  Sclera clear, conjunctiva pink Ears- hearing intact Oropharynx- clear Neck- supple, no JVP Lymph- no cervical lymphadenopathy Lungs- Clear to ausculation bilaterally, normal work of breathing Heart- irregular rate and rhythm, no murmurs, rubs or gallops, PMI not laterally displaced GI- soft, NT, ND, + BS Extremities- no clubbing, cyanosis, or  Trace on left, 1+ on right  MS- no significant deformity or atrophy Skin- no rash or lesion Psych- euthymic mood, full affect Neuro- strength and sensation are intact  EKG-afib at 77 bpm, qrs int 112 ms, qtc 359 ms Echo-1. Left ventricular ejection fraction, by visual estimation, is 65 to 70%. The left ventricle has normal function. There is moderately increased left ventricular hypertrophy. 2. Small left ventricular internal cavity size. 3. The left ventricle has no regional wall motion abnormalities. 4. Global right ventricle has normal systolic function.The right ventricular size is normal. No increase in right ventricular wall thickness. 5. Left atrial size was moderately dilated. 6. Right atrial size was normal. 7. The mitral valve is  normal in structure. Mild mitral valve regurgitation. 8. The tricuspid valve is normal in structure. Tricuspid valve regurgitation is mild. 9. The aortic valve is tricuspid. Aortic valve regurgitation is not visualized. Mild to moderate aortic valve sclerosis/calcification without any evidence of aortic stenosis. 10. Moderate calcifications of AV leaflets. No stenosis. 11. The pulmonic valve was not well visualized. Pulmonic valve regurgitation is not visualized. 12. Mildly elevated pulmonary artery systolic pressure   Assessment and Plan: 1. Persistent afib discussed with daughter and pt Memory  issues with taking pm doses  Will switch to xarelto 15 mg daily at breakfast and stop eliquis for CHA2DS2VASc score of at least 6 Daughter is hesitant to start a new drug to restore SR as not to confuse pt further for now Therefore, will proceed with TEE guided cardioversion after 5 doses of xarelto Bmet/cbc done on 6/16 Covid test Has had both shots and had  covid earlier in the year  2. HTN Stable   3. Diastolic HF On lasix Daughter gives an extra 20 mg for increased pedal edema She plans to give an extra 20 this pm  Edema and shortness of breath mor noticeable in afib    [place] Agent C. [facility] [address] [place], [place] [address] [phone]

## 2020-04-08 NOTE — Patient Instructions (Signed)
Cardioversion scheduled for Wednesday, June 30th  - Arrive at the [facility] and go to admitting at 9:30am  - Do not eat or drink anything after midnight the night prior to your procedure.  - Take all your morning medication (except diabetic medications) with a sip of water prior to arrival.  - You will not be able to drive home after your procedure.  - Do NOT miss any doses of your blood thinner - if you should miss a dose please notify our office immediately.  STOP eliquis after tonights dose  START Xarelto 15mg  once a day WITH BREAKFAST starting tomorrow 6/24.

## 2020-04-09 ENCOUNTER — Encounter (HOSPITAL_COMMUNITY): Payer: Self-pay | Admitting: Nurse Practitioner

## 2020-04-11 ENCOUNTER — Other Ambulatory Visit (HOSPITAL_COMMUNITY)
Admission: RE | Admit: 2020-04-11 | Discharge: 2020-04-11 | Disposition: A | Discharge status: Home / Self Care | Payer: Medicare Other | Source: Ambulatory Visit | Attending: Internal Medicine | Admitting: Internal Medicine

## 2020-04-11 DIAGNOSIS — Z20822 Contact with and (suspected) exposure to covid-19: Secondary | ICD-10-CM | POA: Diagnosis not present

## 2020-04-11 DIAGNOSIS — Z01812 Encounter for preprocedural laboratory examination: Secondary | ICD-10-CM | POA: Diagnosis present

## 2020-04-11 LAB — SARS CORONAVIRUS 2 (TAT 6-24 HRS): SARS Coronavirus 2: NEGATIVE

## 2020-04-13 ENCOUNTER — Telehealth (HOSPITAL_COMMUNITY): Payer: Self-pay

## 2020-04-13 ENCOUNTER — Ambulatory Visit (HOSPITAL_COMMUNITY): Payer: Medicare Other | Admitting: Nurse Practitioner

## 2020-04-13 NOTE — Telephone Encounter (Signed)
Called to schedule mra, no answer, left vm. AW  ?

## 2020-04-15 ENCOUNTER — Ambulatory Visit (HOSPITAL_BASED_OUTPATIENT_CLINIC_OR_DEPARTMENT_OTHER)
Admission: RE | Admit: 2020-04-15 | Discharge: 2020-04-15 | Disposition: A | Discharge status: Home / Self Care | Payer: Medicare Other | Source: Ambulatory Visit | Attending: Nurse Practitioner | Admitting: Nurse Practitioner

## 2020-04-15 ENCOUNTER — Other Ambulatory Visit: Payer: Self-pay

## 2020-04-15 ENCOUNTER — Ambulatory Visit (HOSPITAL_COMMUNITY): Payer: Medicare Other | Admitting: Anesthesiology

## 2020-04-15 ENCOUNTER — Encounter (HOSPITAL_COMMUNITY): Payer: Self-pay | Admitting: Internal Medicine

## 2020-04-15 ENCOUNTER — Encounter (HOSPITAL_COMMUNITY)
Admission: RE | Disposition: A | Discharge status: Home / Self Care | Payer: Self-pay | Source: Home / Self Care | Attending: Internal Medicine

## 2020-04-15 ENCOUNTER — Ambulatory Visit (HOSPITAL_COMMUNITY)
Admission: RE | Admit: 2020-04-15 | Discharge: 2020-04-15 | Disposition: A | Discharge status: Home / Self Care | Payer: Medicare Other | Attending: Internal Medicine | Admitting: Internal Medicine

## 2020-04-15 DIAGNOSIS — I110 Hypertensive heart disease with heart failure: Secondary | ICD-10-CM | POA: Diagnosis not present

## 2020-04-15 DIAGNOSIS — Z7901 Long term (current) use of anticoagulants: Secondary | ICD-10-CM | POA: Insufficient documentation

## 2020-04-15 DIAGNOSIS — Z79899 Other long term (current) drug therapy: Secondary | ICD-10-CM | POA: Insufficient documentation

## 2020-04-15 DIAGNOSIS — I4819 Other persistent atrial fibrillation: Secondary | ICD-10-CM

## 2020-04-15 DIAGNOSIS — I5032 Chronic diastolic (congestive) heart failure: Secondary | ICD-10-CM | POA: Insufficient documentation

## 2020-04-15 DIAGNOSIS — E079 Disorder of thyroid, unspecified: Secondary | ICD-10-CM | POA: Diagnosis not present

## 2020-04-15 DIAGNOSIS — Z8673 Personal history of transient ischemic attack (TIA), and cerebral infarction without residual deficits: Secondary | ICD-10-CM | POA: Insufficient documentation

## 2020-04-15 DIAGNOSIS — Z79890 Hormone replacement therapy (postmenopausal): Secondary | ICD-10-CM | POA: Insufficient documentation

## 2020-04-15 DIAGNOSIS — I340 Nonrheumatic mitral (valve) insufficiency: Secondary | ICD-10-CM

## 2020-04-15 DIAGNOSIS — I10 Essential (primary) hypertension: Secondary | ICD-10-CM

## 2020-04-15 HISTORY — PX: TEE WITHOUT CARDIOVERSION: SHX5443

## 2020-04-15 HISTORY — PX: CARDIOVERSION: SHX1299

## 2020-04-15 LAB — POCT I-STAT, CHEM 8
BUN: 23 mg/dL (ref 8–23)
Calcium, Ion: 1.16 mmol/L (ref 1.15–1.40)
Chloride: 104 mmol/L (ref 98–111)
Creatinine, Ser: 1.7 mg/dL — ABNORMAL HIGH (ref 0.44–1.00)
Glucose, Bld: 98 mg/dL (ref 70–99)
HCT: 36 % (ref 36.0–46.0)
Hemoglobin: 12.2 g/dL (ref 12.0–15.0)
Potassium: 3.4 mmol/L — ABNORMAL LOW (ref 3.5–5.1)
Sodium: 140 mmol/L (ref 135–145)
TCO2: 22 mmol/L (ref 22–32)

## 2020-04-15 SURGERY — ECHOCARDIOGRAM, TRANSESOPHAGEAL
Anesthesia: Monitor Anesthesia Care

## 2020-04-15 MED ORDER — SODIUM CHLORIDE 0.9 % IV SOLN
INTRAVENOUS | Status: AC | PRN
  Administered 2020-04-15: 500 mL via INTRAMUSCULAR

## 2020-04-15 MED ORDER — SODIUM CHLORIDE 0.9 % IV SOLN: INTRAVENOUS | Status: DC

## 2020-04-15 MED ORDER — PROPOFOL 10 MG/ML IV BOLUS
INTRAVENOUS | Status: DC | PRN
  Administered 2020-04-15 (×3): 20 mg via INTRAVENOUS

## 2020-04-15 MED ORDER — PROPOFOL 500 MG/50ML IV EMUL
INTRAVENOUS | Status: DC | PRN
  Administered 2020-04-15: 75 ug/kg/min via INTRAVENOUS

## 2020-04-15 MED ORDER — DILTIAZEM HCL ER COATED BEADS 120 MG PO CP24
120.0000 mg | ORAL_CAPSULE | Freq: Every day | ORAL | 3 refills | Status: DC
Start: 2020-04-15 — End: 2020-06-25

## 2020-04-15 MED ORDER — PHENYLEPHRINE HCL-NACL 10-0.9 MG/250ML-% IV SOLN
INTRAVENOUS | Status: DC | PRN
  Administered 2020-04-15: 25 ug/min via INTRAVENOUS

## 2020-04-15 MED ORDER — AMLODIPINE BESYLATE 2.5 MG PO TABS: ORAL_TABLET | ORAL | 3 refills | Status: DC

## 2020-04-15 NOTE — CV Procedure (Signed)
INDICATIONS: afib r/o thrombus  PROCEDURE:   Informed consent was obtained prior to the procedure. The risks, benefits and alternatives for the procedure were discussed and the patient comprehended these risks.  Risks include, but are not limited to, cough, sore throat, vomiting, nausea, somnolence, esophageal and stomach trauma or perforation, bleeding, low blood pressure, aspiration, pneumonia, infection, trauma to the teeth and death.    After a procedural time-out, the oropharynx was anesthetized with 20% benzocaine spray.   During this procedure the patient was administered propofol for deep monitored sedation.  The patient's heart rate, blood pressure, and oxygen saturationweare monitored continuously during the procedure. The period of conscious sedation was 45 minutes, of which I was present face-to-face 100% of this time.  The transesophageal probe was inserted in the esophagus and stomach without difficulty and multiple views were obtained.  The patient was kept under observation until the patient left the procedure room.  The patient left the procedure room in stable condition.   Agitated microbubble saline contrast was administered.  COMPLICATIONS:    There were no immediate complications.  FINDINGS:  No LA appendage thrombus. No intracardiac thrombus.  Probable moderate-severe MR, formal quantitation in echo report.   Normal LV function.   Remainder per echo report.   RECOMMENDATIONS:     proceed to TEE.  Time Spent Directly with the Patient:    Parke Poisson 04/15/2020, 12:00 PM  Procedure: Electrical Cardioversion Indications:  Atrial Fibrillation  Procedure Details:  Consent: Risks of procedure as well as the alternatives and risks of each were explained to the patient and caregiver.  Consent for procedure obtained.  Time Out: Verified patient identification, verified procedure, site/side was marked, verified correct patient position, special  equipment/implants available, medications/allergies/relevent history reviewed, required imaging and test results available. PERFORMED.  Patient placed on cardiac monitor, pulse oximetry, supplemental oxygen as necessary.  Sedation given: propofol per anesthesia Pacer pads placed anterior and posterior chest.  Cardioverted 3 time(s).  Cardioversion with synchronized biphasic 120J, 150J, 200J shock.  Evaluation: Findings: Post procedure EKG shows: NSR with PACs Complications: None Patient did tolerate procedure well.  Time Spent Directly with the Patient:  30 minutes   Parke Poisson 04/15/2020, 12:00 PM

## 2020-04-15 NOTE — Progress Notes (Signed)
  Echocardiogram Echocardiogram Transesophageal has been performed.  Belinda Anderson 04/15/2020, 12:05 PM

## 2020-04-15 NOTE — Interval H&P Note (Signed)
History and Physical Interval Note:  04/15/2020 10:43 AM  Belinda Anderson  has presented today for surgery, with the diagnosis of AFIB.  The various methods of treatment have been discussed with the patient and family. After consideration of risks, benefits and other options for treatment, the patient has consented to  Procedure(s): TRANSESOPHAGEAL ECHOCARDIOGRAM (TEE) (N/A) CARDIOVERSION (N/A) as a surgical intervention.  The patient's history has been reviewed, patient examined, no change in status, stable for surgery.  I have reviewed the patient's chart and labs.  Questions were answered to the patient's satisfaction.     Parke Poisson

## 2020-04-15 NOTE — Transfer of Care (Signed)
Immediate Anesthesia Transfer of Care Note  Patient: Belinda Anderson  Procedure(s) Performed: TRANSESOPHAGEAL ECHOCARDIOGRAM (TEE) (N/A ) CARDIOVERSION (N/A )  Patient Location: Endoscopy Unit  Anesthesia Type:MAC  Level of Consciousness: drowsy  Airway & Oxygen Therapy: Patient Spontanous Breathing and Patient connected to nasal cannula oxygen  Post-op Assessment: Report given to RN and Post -op Vital signs reviewed and stable  Post vital signs: Reviewed and stable  Last Vitals:  Vitals Value Taken Time  BP 93/41 04/15/20 1201  Temp    Pulse 63 04/15/20 1201  Resp 19 04/15/20 1201  SpO2 96 % 04/15/20 1201  Vitals shown include unvalidated device data.  Last Pain:  Vitals:   04/15/20 0950  TempSrc: Oral  PainSc: 0-No pain         Complications: No complications documented.

## 2020-04-15 NOTE — Anesthesia Postprocedure Evaluation (Signed)
Anesthesia Post Note  Patient: Belinda Anderson  Procedure(s) Performed: TRANSESOPHAGEAL ECHOCARDIOGRAM (TEE) (N/A ) CARDIOVERSION (N/A )     Patient location during evaluation: Endoscopy Anesthesia Type: MAC Level of consciousness: awake and alert, oriented and patient cooperative Pain management: pain level controlled Vital Signs Assessment: post-procedure vital signs reviewed and stable Respiratory status: spontaneous breathing, nonlabored ventilation and respiratory function stable Cardiovascular status: blood pressure returned to baseline and stable Postop Assessment: no apparent nausea or vomiting Anesthetic complications: no   No complications documented.  Last Vitals:  Vitals:   04/15/20 1225 04/15/20 1227  BP: (!) 106/36 93/69  Pulse: 86 88  Resp: (!) 23 (!) 21  Temp:    SpO2:  100%    Last Pain:  Vitals:   04/15/20 1227  TempSrc:   PainSc: 0-No pain                 [place] Gustafson,E. Aerin Delany

## 2020-04-15 NOTE — Anesthesia Preprocedure Evaluation (Addendum)
Anesthesia Evaluation  Patient identified by MRN, date of birth, ID band Patient awake    Reviewed: Allergy & Precautions, NPO status , Patient's Chart, lab work & pertinent test results  History of Anesthesia Complications Negative for: history of anesthetic complications  Airway Mallampati: II  TM Distance: >3 FB Neck ROM: Full    Dental  (+) Dental Advisory Given, Caps   Pulmonary neg pulmonary ROS,  04/11/2020 SARS coronavirus NEG   breath sounds clear to auscultation       Cardiovascular hypertension, Pt. on medications (-) angina+ Peripheral Vascular Disease  + dysrhythmias Atrial Fibrillation  Rhythm:Irregular Rate:Normal  '20 ECHO: EF 70%, mild MR, mild TR   Neuro/Psych TIACVA, No Residual Symptoms    GI/Hepatic negative GI ROS, Neg liver ROS,   Endo/Other  Hypothyroidism   Renal/GU Renal InsufficiencyRenal disease     Musculoskeletal  (+) Arthritis , Osteoarthritis,    Abdominal   Peds  Hematology xarelto   Anesthesia Other Findings   Reproductive/Obstetrics                            Anesthesia Physical Anesthesia Plan  ASA: III  Anesthesia Plan: General and MAC   Post-op Pain Management:    Induction: Intravenous  PONV Risk Score and Plan: 3 and Treatment may vary due to age or medical condition  Airway Management Planned: Mask and Natural Airway  Additional Equipment: None  Intra-op Plan:   Post-operative Plan:   Informed Consent: I have reviewed the patients History and Physical, chart, labs and discussed the procedure including the risks, benefits and alternatives for the proposed anesthesia with the patient or authorized representative who has indicated his/her understanding and acceptance.   Patient has DNR.  Discussed DNR with patient, Discussed DNR with power of attorney and Suspend DNR.   Dental advisory given  Plan Discussed with: Surgeon and  CRNA  Anesthesia Plan Comments:        Anesthesia Quick Evaluation

## 2020-04-16 ENCOUNTER — Encounter (HOSPITAL_COMMUNITY): Payer: Self-pay | Admitting: Internal Medicine

## 2020-04-16 ENCOUNTER — Other Ambulatory Visit: Payer: Self-pay

## 2020-04-16 ENCOUNTER — Emergency Department (HOSPITAL_BASED_OUTPATIENT_CLINIC_OR_DEPARTMENT_OTHER)
Admit: 2020-04-16 | Discharge: 2020-04-16 | Disposition: A | Discharge status: Home / Self Care | Payer: Medicare Other | Attending: Emergency Medicine | Admitting: Emergency Medicine

## 2020-04-16 ENCOUNTER — Telehealth: Payer: Self-pay | Admitting: Student

## 2020-04-16 ENCOUNTER — Emergency Department (HOSPITAL_COMMUNITY): Payer: Medicare Other

## 2020-04-16 ENCOUNTER — Emergency Department (HOSPITAL_COMMUNITY)
Admission: EM | Admit: 2020-04-16 | Discharge: 2020-04-16 | Disposition: A | Discharge status: Home / Self Care | Payer: Medicare Other | Attending: Emergency Medicine | Admitting: Emergency Medicine

## 2020-04-16 DIAGNOSIS — I4891 Unspecified atrial fibrillation: Secondary | ICD-10-CM | POA: Diagnosis not present

## 2020-04-16 DIAGNOSIS — Z79899 Other long term (current) drug therapy: Secondary | ICD-10-CM | POA: Insufficient documentation

## 2020-04-16 DIAGNOSIS — R609 Edema, unspecified: Secondary | ICD-10-CM | POA: Insufficient documentation

## 2020-04-16 DIAGNOSIS — Z8616 Personal history of COVID-19: Secondary | ICD-10-CM | POA: Diagnosis not present

## 2020-04-16 DIAGNOSIS — I509 Heart failure, unspecified: Secondary | ICD-10-CM | POA: Insufficient documentation

## 2020-04-16 DIAGNOSIS — M79672 Pain in left foot: Secondary | ICD-10-CM | POA: Insufficient documentation

## 2020-04-16 DIAGNOSIS — E039 Hypothyroidism, unspecified: Secondary | ICD-10-CM | POA: Insufficient documentation

## 2020-04-16 DIAGNOSIS — I110 Hypertensive heart disease with heart failure: Secondary | ICD-10-CM | POA: Insufficient documentation

## 2020-04-16 LAB — BASIC METABOLIC PANEL
Anion gap: 11 (ref 5–15)
BUN: 19 mg/dL (ref 8–23)
CO2: 22 mmol/L (ref 22–32)
Calcium: 8.6 mg/dL — ABNORMAL LOW (ref 8.9–10.3)
Chloride: 107 mmol/L (ref 98–111)
Creatinine, Ser: 1.65 mg/dL — ABNORMAL HIGH (ref 0.44–1.00)
GFR calc Af Amer: 32 mL/min — ABNORMAL LOW (ref >60)
GFR calc non Af Amer: 28 mL/min — ABNORMAL LOW (ref >60)
Glucose, Bld: 97 mg/dL (ref 70–99)
Potassium: 3.8 mmol/L (ref 3.5–5.1)
Sodium: 140 mmol/L (ref 135–145)

## 2020-04-16 LAB — URINALYSIS, ROUTINE W REFLEX MICROSCOPIC
Bilirubin Urine: NEGATIVE
Glucose, UA: NEGATIVE mg/dL
Ketones, ur: NEGATIVE mg/dL
Leukocytes,Ua: NEGATIVE
Nitrite: NEGATIVE
Protein, ur: NEGATIVE mg/dL
Specific Gravity, Urine: 1.006 (ref 1.005–1.030)
pH: 6 (ref 5.0–8.0)

## 2020-04-16 LAB — CBC
HCT: 39 % (ref 36.0–46.0)
Hemoglobin: 12.2 g/dL (ref 12.0–15.0)
MCH: 28.2 pg (ref 26.0–34.0)
MCHC: 31.3 g/dL (ref 30.0–36.0)
MCV: 90.1 fL (ref 80.0–100.0)
Platelets: 233 10*3/uL (ref 150–400)
RBC: 4.33 MIL/uL (ref 3.87–5.11)
RDW: 14.4 % (ref 11.5–15.5)
WBC: 4.8 10*3/uL (ref 4.0–10.5)
nRBC: 0 % (ref 0.0–0.2)

## 2020-04-16 LAB — BRAIN NATRIURETIC PEPTIDE: B Natriuretic Peptide: 487.2 pg/mL — ABNORMAL HIGH (ref 0.0–100.0)

## 2020-04-16 LAB — TROPONIN I (HIGH SENSITIVITY): Troponin I (High Sensitivity): 8 ng/L (ref <18)

## 2020-04-16 MED ORDER — FUROSEMIDE 10 MG/ML IJ SOLN
40.0000 mg | Freq: Once | INTRAMUSCULAR | Status: AC
  Administered 2020-04-16: 40 mg via INTRAVENOUS
  Filled 2020-04-16: qty 4

## 2020-04-16 NOTE — Discharge Instructions (Addendum)
Make sure you continue to take your Lasix as prescribed. Wear compression socks to help with foot swelling and pain. Keep your feet elevated to help with swelling. Continue taking all your other home medications as prescribed. Eat a low-salt diet to decrease swelling. Call your cardiologist to set up a follow-up appointment for further evaluation. Return to the emergency room if you develop fevers, difficulty breathing, chest pain, or any new, worsening, or concerning symptoms.

## 2020-04-16 NOTE — ED Notes (Signed)
Pt ambulates with cane to restroom, at baseline

## 2020-04-16 NOTE — ED Triage Notes (Signed)
Pt endorses worsening leg swelling on right leg. Denies SOB or pain.

## 2020-04-16 NOTE — Progress Notes (Signed)
Right lower extremity venous duplex has been completed. Preliminary results can be found in CV Proc through chart review.  Results were given to [facility] PA.  04/16/20 4:28 PM Olen Cordial RVT

## 2020-04-16 NOTE — Telephone Encounter (Signed)
Pt c/o swelling: STAT is pt has developed SOB within 24 hours  1) How much weight have you gained and in what time span? 5 lbs since her last appt   2) If swelling, where is the swelling located? Legs, severe pain in right foot   3) Are you currently taking a fluid pill? Yes  4) Are you currently SOB? Yes    5) Do you have a log of your daily weights (if so, list)? No  6) Have you gained 3 pounds in a day or 5 pounds in a week? Yes   7) Have you traveled recently? No

## 2020-04-16 NOTE — ED Provider Notes (Signed)
[facility] EMERGENCY DEPARTMENT Provider Note   CSN: [id number] Arrival date & time: 04/16/20  1222     History Chief Complaint  Patient presents with  . Leg Swelling    Belinda Anderson is a 86 y.o. female presenting for evaluation of leg swelling and foot pain.  Patient states she has had leg swelling for several weeks.  She reports worsening foot pain today.  Patient denies all other symptoms, including fever, cough, chest pain, shortness of breath.  She states she has been taking her Lasix as prescribed, no change in urination.  She denies nausea, vomiting, abdominal pain, abnormal bowel movements.  She is on a blood thinner, has been taking it as prescribed.  Additional history obtained from patient's daughter.  Per daughter, patient had a cardioversion yesterday.  Recent increase in Lasix dosing by cardiology as well.  Daughter states patient has been more short of breath than normal.  HPI     Past Medical History:  Diagnosis Date  . Arthritis   . Atrial fibrillation (HCC)   . Hypertension   . Thyroid disease   . TIA (transient ischemic attack)     Patient Active Problem List   Diagnosis Date Noted  . Lower extremity edema 01/30/2020  . COVID-19 virus infection 11/21/2019  . Cough 11/21/2019  . Hyperlipidemia associated with type 2 diabetes mellitus (HCC) 10/12/2019  . External hemorrhoid, bleeding 10/12/2019  . Atrial fibrillation (HCC) 10/02/2019  . 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 Middle cerebral artery embolism, right 09/30/2019  . Contusion of foot including toes, right, initial encounter 08/10/2018  . Primary osteoarthritis of right knee 07/31/2018  . Right leg swelling 07/17/2018  . Pain of right calf 07/17/2018  . Preventative health care 09/18/2017  . Low back pain radiating to left leg 11/28/2016  . Hyperlipidemia 02/02/2015  . Sciatica 07/16/2014  . Knee pain, acute  07/16/2014  . Lower extremity pain, right 07/16/2014  . Skin infection 07/16/2014  . Incontinence of urine in female 06/19/2013  . Cerebral aneurysm rupture (HCC) 04/08/2013  . LEG EDEMA, RIGHT 07/06/2010  . OTHER DRUG ALLERGY 07/06/2010  . DIARRHEA 02/12/2010  . HEMATURIA, HX OF 11/24/2009  . ANEURYSM, HX OF 08/03/2009  . B12 DEFICIENCY 07/24/2009  . 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 SYNCOPE 07/20/2009  . DIZZINESS 07/20/2009  . Memory loss 07/20/2009  . 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 Hypothyroidism 12/14/2007  . Essential hypertension 12/14/2007  . DIVERTICULITIS, HX OF 12/14/2007  . GOITER 01/30/2007  . HYPERTENSION, BENIGN 01/30/2007  . UTERINE POLYP 01/30/2007  . THYROIDECTOMY, HX OF 01/30/2007    Past Surgical History:  Procedure Laterality Date  . CARDIOVERSION N/A 04/15/2020   Procedure: CARDIOVERSION;  Surgeon: Parke Poisson, MD;  Location: [facility] ENDOSCOPY;  Service: Cardiovascular;  Laterality: N/A;  . EYE SURGERY     lens implant  . IR ANGIO VERTEBRAL SEL SUBCLAVIAN INNOMINATE UNI R MOD SED  09/30/2019  . IR CT HEAD LTD  09/30/2019  . IR PERCUTANEOUS ART THROMBECTOMY/INFUSION INTRACRANIAL INC DIAG ANGIO  09/30/2019  . RADIOLOGY WITH ANESTHESIA N/A 09/30/2019   Procedure: IR WITH ANESTHESIA;  Surgeon: Julieanne Cotton, MD;  Location: [facility] OR;  Service: Radiology;  Laterality: N/A;  . TEE WITHOUT CARDIOVERSION N/A 04/15/2020   Procedure: TRANSESOPHAGEAL ECHOCARDIOGRAM (TEE);  Surgeon: Parke Poisson, MD;  Location: [facility] ENDOSCOPY;  Service: Cardiovascular;  Laterality: N/A;  OB History   No obstetric history on file.     Family History  Problem Relation Age of Onset  . Arthritis Mother   . Heart disease Mother   . Heart disease Father 81       MI  . Coronary artery disease Other   . Diabetes Brother        DI    Social History   Tobacco Use  . Smoking status: Never Smoker  . Smokeless tobacco: Never Used    Vaping Use  . Vaping Use: Never used  Substance Use Topics  . Alcohol use: No  . Drug use: No    Home Medications Prior to Admission medications   Medication Sig Start Date End Date Taking? Authorizing Provider  acetaminophen (TYLENOL) 650 MG CR tablet Take 1,300 mg by mouth daily as needed for pain (arthritis).     [provider]  amLODipine (NORVASC) 2.5 MG tablet TAKE 1 TABLET BY MOUTH ONCE DAILY Do not take today 6/30 or 7/1 04/15/20   Parke Poisson, MD  diclofenac Sodium (VOLTAREN) 1 % GEL Apply 2 g topically daily as needed (pain).     [provider]  diltiazem (CARDIZEM CD) 120 MG 24 hr capsule Take 1 capsule (120 mg total) by mouth daily. Do not take 6/30 or 7/1. Hold this medication if heart rate is less than 60 beats per minute. 04/15/20   Parke Poisson, MD  fluticasone Aleda Grana) 50 MCG/ACT nasal spray Place 2 sprays into both nostrils daily as needed for allergies or rhinitis.     [provider]  furosemide (LASIX) 20 MG tablet Take 2 tablets (40mg ) by mouth daily, may take and additional 20 mg as needed for swelling and edema 04/01/20   04/03/20, PA-C  Glycerin-Hypromellose-PEG 400 ([employer] DRY EYE RELIEF) 0.2-0.2-1 % SOLN Place 1 drop into both eyes daily as needed (Dry eye).    [provider]  hydrocortisone (ANUSOL-HC) 2.5 % rectal cream Place 1 application rectally daily as needed for hemorrhoids or anal itching.     [provider]  levothyroxine (SYNTHROID) 75 MCG tablet TAKE 1 TABLET(75 MCG) BY MOUTH DAILY Patient taking differently: Take 75 mcg by mouth daily before breakfast.  10/21/19   12/19/19, [name], DO  potassium chloride SA (KLOR-CON) 20 MEQ tablet Take 1 tablet (20 mEq total) by mouth daily. 10/21/19   12/19/19, DO  pravastatin (PRAVACHOL) 40 MG tablet Take 1 tablet (40 mg total) by mouth daily. 02/27/20   02/29/20 R, DO  psyllium (METAMUCIL) 0.52 g capsule Take 0.52 g by  mouth daily.    [provider]  Rivaroxaban (XARELTO) 15 MG TABS tablet Take 1 tablet (15 mg total) by mouth daily with breakfast. 04/08/20   04/10/20, NP    Allergies    Patient has no known allergies.  Review of Systems   Review of Systems  Cardiovascular: Positive for leg swelling.  Musculoskeletal: Positive for myalgias.  Hematological: Bruises/bleeds easily.  All other systems reviewed and are negative.   Physical Exam Updated Vital Signs BP 96/78 (BP Location: Right Arm)   Pulse 97   Temp 97.6 F (36.4 C) (Oral)   Resp (!) 21   SpO2 97%   Physical Exam Vitals and nursing note reviewed.  Constitutional:      General: She is not in acute distress.    Appearance: She is well-developed.     Comments: Elderly female  resting comfortably in the bed in no acute distress  HENT:     Head: Normocephalic and atraumatic.  Eyes:     Conjunctiva/sclera: Conjunctivae normal.     Pupils: Pupils are equal, round, and reactive to light.  Cardiovascular:     Rate and Rhythm: Normal rate. Rhythm irregular.     Pulses: Normal pulses.     Comments: Irregular Pulmonary:     Effort: Pulmonary effort is normal. No respiratory distress.     Breath sounds: Normal breath sounds. No wheezing.     Comments: Clear lung sounds. Speaking in full sentences Abdominal:     General: There is no distension.     Palpations: Abdomen is soft. There is no mass.     Tenderness: There is no abdominal tenderness. There is no guarding or rebound.  Musculoskeletal:        General: Normal range of motion.     Cervical back: Normal range of motion and neck supple.     Right lower leg: Edema present.     Left lower leg: Edema present.     Comments: 2+ pitting edema bilaterally.  Mild increased edema of the right lower leg when compared to L. Pedal pulses 2 + bilaterally.  Good cap refill.  Good distal sensation  Skin:    General: Skin is warm and dry.     Capillary Refill: Capillary refill  takes less than 2 seconds.  Neurological:     Mental Status: She is alert and oriented to person, place, and time.     ED Results / Procedures / Treatments   Labs (all labs ordered are listed, but only abnormal results are displayed) Labs Reviewed  BASIC METABOLIC PANEL - Abnormal; Notable for the following components:      Result Value   Creatinine, Ser 1.65 (*)    Calcium 8.6 (*)    GFR calc non Af Amer 28 (*)    GFR calc Af Amer 32 (*)    All other components within normal limits  URINALYSIS, ROUTINE W REFLEX MICROSCOPIC - Abnormal; Notable for the following components:   Color, Urine STRAW (*)    Hgb urine dipstick SMALL (*)    Bacteria, UA MANY (*)    All other components within normal limits  BRAIN NATRIURETIC PEPTIDE - Abnormal; Notable for the following components:   B Natriuretic Peptide 487.2 (*)    All other components within normal limits  CBC  TROPONIN I (HIGH SENSITIVITY)    EKG EKG Interpretation  Date/Time:  Thursday April 16 2020 12:32:45 EDT Ventricular Rate:  114 PR Interval:    QRS Duration: 100 QT Interval:  370 QTC Calculation: 509 R Axis:   75 Text Interpretation: Atrial fibrillation Low voltage QRS Incomplete right bundle branch block Nonspecific ST and T wave abnormality Abnormal ECG Reconfirmed by Rochele Raring [phone]) on 04/16/2020 6:04:37 PM   Radiology DG Chest 2 View  Result Date: 04/16/2020 CLINICAL DATA:  Shortness of breath EXAM: CHEST - 2 VIEW COMPARISON:  09/30/2019 FINDINGS: Heart is mildly enlarged. Lungs clear. No effusions or edema. No acute bony abnormality. IMPRESSION: Mild cardiomegaly.  No active disease. Electronically Signed   By: Charlett Nose M.D.   On: 04/16/2020 18:53   ECHO TEE  Result Date: 04/16/2020    TRANSESOPHOGEAL ECHO REPORT   Patient Name:   Belinda Anderson Date of Exam: 04/15/2020 Medical Rec #:  [id number]       Height:       64.0 in  Accession #:    [id number]      Weight:       155.6 lb Date of Birth:  02-16-1932        BSA:          1.758 m Patient Age:    87 years        BP:           113/71 mmHg Patient Gender: F               HR:           111 bpm. Exam Location:  Inpatient Procedure: 3D Echo, Transesophageal Echo, Cardiac Doppler, Color Doppler and            Saline Contrast Bubble Study Indications:     I48.91* Unspeicified atrial fibrillation  History:         Patient has prior history of Echocardiogram examinations, most                  recent 09/30/2019. Abnormal ECG, Stroke, Arrythmias:Atrial                  Fibrillation, Signs/Symptoms:Syncope, Dizziness/Lightheadedness                  and Altered Mental Status; Risk Factors:Hypertension and                  Dyslipidemia.  Sonographer:     Sheralyn Boatmanina West RDCS Referring Phys:  [id number] DONNA C CARROLL Diagnosing Phys: Weston BrassGayatri Acharya MD PROCEDURE: After discussion of the risks and benefits of a TEE, an informed consent was obtained from a family member. TEE procedure time was 31 minutes. The transesophogeal probe was passed without difficulty through the esophogus of the patient. Imaged  were obtained with the patient in a left lateral decubitus position. Sedation performed by different physician. The patient was monitored while under deep sedation. Anesthestetic sedation was provided intravenously by Anesthesiology: 235mg  of Propofol. Image quality was good. The patient's vital signs; including heart rate, blood pressure, and oxygen saturation; remained stable throughout the procedure. The patient developed no complications during the procedure. A successful direct current cardioversion was performed at 120J, 150J, 200J joules with 3 attempts. IMPRESSIONS  1. Left ventricular ejection fraction, by estimation, is 65 to 70%. The left ventricle has normal function. The left ventricle has no regional wall motion abnormalities.  2. Right ventricular systolic function is normal. The right ventricular size is mildly enlarged.  3. Left atrial size was moderately dilated. No left  atrial/left atrial appendage thrombus was detected. The LAA emptying velocity was 36 cm/s.  4. Right atrial size was severely dilated.  5. The mitral valve is degenerative. Moderate to severe mitral valve regurgitation.  6. The tricuspid valve is degenerative. Tricuspid valve regurgitation is moderate to severe.  7. The aortic valve is tricuspid. Aortic valve regurgitation is trivial. Mild to moderate aortic valve sclerosis/calcification is present, without any evidence of aortic stenosis.  8. There is Severe (Grade IV) atheroma plaque involving the transverse and descending aorta.  9. Evidence of atrial level shunting detected by color flow Doppler. Agitated saline contrast bubble study was positive with shunting observed within 3-6 cardiac cycles suggestive of interatrial shunt. There is a small patent foramen ovale with bidirectional shunting across atrial septum. Conclusion(s)/Recommendation(s): No LA/LAA thrombus identified. Successful cardioversion performed with restoration of normal sinus rhythm. FINDINGS  Left Ventricle: Left ventricular ejection fraction, by estimation, is 65 to 70%. The left ventricle has  normal function. The left ventricle has no regional wall motion abnormalities. The left ventricular internal cavity size was normal in size. Right Ventricle: The right ventricular size is mildly enlarged. No increase in right ventricular wall thickness. Right ventricular systolic function is normal. Left Atrium: Left atrial size was moderately dilated. No left atrial/left atrial appendage thrombus was detected. The LAA emptying velocity was 36 cm/s. Right Atrium: Right atrial size was severely dilated. Pericardium: There is no evidence of pericardial effusion. Mitral Valve: Quantitation performed on one dominant MR jet, however there are likely 2 jets of MR. The mitral valve is degenerative in appearance. There is mild calcification of the mitral valve leaflet(s). Moderate to severe mitral valve  regurgitation. Tricuspid Valve: The tricuspid valve is degenerative in appearance. Tricuspid valve regurgitation is moderate to severe. Aortic Valve: The aortic valve is tricuspid. Aortic valve regurgitation is trivial. Mild to moderate aortic valve sclerosis/calcification is present, without any evidence of aortic stenosis. Pulmonic Valve: The pulmonic valve was normal in structure. Pulmonic valve regurgitation is trivial. Aorta: The aortic root and ascending aorta are structurally normal, with no evidence of dilitation. There is severe (Grade IV) atheroma plaque involving the transverse and descending aorta. IAS/Shunts: Evidence of atrial level shunting detected by color flow Doppler. Agitated saline contrast was given intravenously to evaluate for intracardiac shunting. Agitated saline contrast bubble study was positive with shunting observed within 3-6 cardiac cycles suggestive of interatrial shunt. A small patent foramen ovale is detected with bidirectional shunting across atrial septum.   AORTA Ao Asc diam: 3.60 cm MR Peak grad:    98.8 mmHg MR Mean grad:    58.0 mmHg MR Vmax:         497.00 cm/s MR Vmean:        341.0 cm/s MR PISA:         3.08 cm MR PISA Eff ROA: 25 mm MR PISA Radius:  0.70 cm Weston Brass MD Electronically signed by Weston Brass MD Signature Date/Time: 04/16/2020/6:14:32 AM    Final    VAS [place] LOWER EXTREMITY VENOUS (DVT) (ONLY [facility] & [facility] 7a-7p)  Result Date: 04/16/2020  Lower Venous DVTStudy Indications: Edema.  Risk Factors: None identified. Limitations: Poor ultrasound/tissue interface. Comparison Study: No prior studies. Performing Technologist: Chanda Busing RVT  Examination Guidelines: A complete evaluation includes B-mode imaging, spectral Doppler, color Doppler, and power Doppler as needed of all accessible portions of each vessel. Bilateral testing is considered an integral part of a complete examination. Limited examinations for reoccurring indications may be performed as  noted. The reflux portion of the exam is performed with the patient in reverse Trendelenburg.  +---------+---------------+---------+-----------+----------+--------------+ RIGHT    CompressibilityPhasicitySpontaneityPropertiesThrombus Aging +---------+---------------+---------+-----------+----------+--------------+ CFV      Full           Yes      Yes                                 +---------+---------------+---------+-----------+----------+--------------+ SFJ      Full                                                        +---------+---------------+---------+-----------+----------+--------------+ FV Prox  Full                                                        +---------+---------------+---------+-----------+----------+--------------+  FV Mid   Full                                                        +---------+---------------+---------+-----------+----------+--------------+ FV DistalFull                                                        +---------+---------------+---------+-----------+----------+--------------+ PFV      Full                                                        +---------+---------------+---------+-----------+----------+--------------+ POP      Full           Yes      Yes                                 +---------+---------------+---------+-----------+----------+--------------+ PTV      Full                                                        +---------+---------------+---------+-----------+----------+--------------+ PERO     Full                                                        +---------+---------------+---------+-----------+----------+--------------+   +----+---------------+---------+-----------+----------+--------------+ LEFTCompressibilityPhasicitySpontaneityPropertiesThrombus Aging +----+---------------+---------+-----------+----------+--------------+ CFV Full           Yes      Yes                                  +----+---------------+---------+-----------+----------+--------------+     Summary: RIGHT: - There is no evidence of deep vein thrombosis in the lower extremity.  - No cystic structure found in the popliteal fossa.  LEFT: - No evidence of common femoral vein obstruction.  *See table(s) above for measurements and observations. Electronically signed by Lemar Livings MD on 04/16/2020 at 4:30:43 PM.    Final     Procedures Procedures (including critical care time)  Medications Ordered in ED Medications  furosemide (LASIX) injection 40 mg (40 mg Intravenous Given 04/16/20 1807)    ED Course  I have reviewed the triage vital signs and the nursing notes.  Pertinent labs & imaging results that were available during my care of the patient were reviewed by me and considered in my medical decision making (see chart for details).    MDM Rules/Calculators/A&P     CHA2DS2-VASc Score: 8                     Patient presenting for evaluation of right lower leg  swelling and worsening pain.  On exam, patient appears nontoxic.  She does have pitting edema bilaterally.  Mild increased tone on the right side.  She is neurovascularly intact.  Patient is denying chest pain or shortness of breath, although patient's daughter is concerned about exertional shortness of breath.  Labs obtained from triage read interpreted by me, creatinine at baseline.  Hemoglobin stable.  DVT ultrasound ordered from triage negative for acute findings.  As such, favor heart failure.  Will give a dose of Lasix while checking a troponin, cxr, and BNP.  BNP mildly elevated at 480, however chest x-ray viewed interpreted by me, no obvious effusion.  There is mild cardiomegaly.  Sats are stable.  I watched patient ambulate without signs of exertional shortness of breath or difficulty.  Troponin negative.  On reassessment after Lasix, patient reports symptoms are improved.  Discussed findings with patient and daughter.   Discussed importance of follow-up with cardiology.  Of note, patient is back in A. fib, had a current cardioversion yesterday.  She appears to have chronic A. fib, will have her follow-up with cardiology.  She is on blood thinners. Chads2vasc 8. Case discussed with attending, Dr. Elesa Massed evaluated the pt. At this time, pt appears safe for d/c. Return precautions given. Pt and daughter state they understand and agree to plan.   Final Clinical Impression(s) / ED Diagnoses Final diagnoses:  Peripheral edema  Atrial fibrillation, unspecified type (HCC)  Chronic congestive heart failure, unspecified heart failure type [facility])    Rx / DC Orders ED Discharge Orders    None       Alveria Apley, PA-C 04/16/20 2146    Ward, Layla Maw, DO 04/16/20 2223

## 2020-04-16 NOTE — Telephone Encounter (Signed)
    Pt's daughter called she said she brought pt in the ED and they did not let her come in the back with pt. She said she is worried because pt has dementia. She would like to get W[place]ster Tillery's help. Advise provider don't have control on that and try to let the nurses know again the pt has dementia

## 2020-04-16 NOTE — Telephone Encounter (Signed)
Patient's daughter calling in, the patient is on different phone at her own home in the background. Had DCCV yesterday. The patient is grunting and complaining of severe pain in her right foot since waking up this morning and shortness of breath.  She woke with right foot being much more swollen and red and hot than it was last night.  She's unable to put on a shoe.  Can barely put weight on it.  Has been taking Lasix 60 mg.  Her daughter does not notice a difference in urine output compared to the 40 mg she was previously taking.  Both legs are swollen to knees, tight, shiny. Unsure if she is in/out of rhythm.  I adv w the sound of patient being in distress and complaining of severe right foot pain and shortness of breath she should be seen urgently at the ED.    Her daughter is going to pick her up and take her to [facility].  Aware I will forward to Dr. Elberta Fortis and his nurse let them know.

## 2020-04-22 ENCOUNTER — Ambulatory Visit (HOSPITAL_COMMUNITY)
Admission: RE | Admit: 2020-04-22 | Discharge: 2020-04-22 | Disposition: A | Discharge status: Home / Self Care | Payer: Medicare Other | Source: Ambulatory Visit | Attending: Nurse Practitioner | Admitting: Nurse Practitioner

## 2020-04-22 ENCOUNTER — Other Ambulatory Visit: Payer: Self-pay

## 2020-04-22 ENCOUNTER — Other Ambulatory Visit (HOSPITAL_COMMUNITY): Payer: Self-pay | Admitting: *Deleted

## 2020-04-22 VITALS — BP 112/60 | HR 89 | Ht 64.0 in | Wt 150.2 lb

## 2020-04-22 DIAGNOSIS — I110 Hypertensive heart disease with heart failure: Secondary | ICD-10-CM | POA: Insufficient documentation

## 2020-04-22 DIAGNOSIS — Z7901 Long term (current) use of anticoagulants: Secondary | ICD-10-CM | POA: Insufficient documentation

## 2020-04-22 DIAGNOSIS — E079 Disorder of thyroid, unspecified: Secondary | ICD-10-CM | POA: Insufficient documentation

## 2020-04-22 DIAGNOSIS — I5030 Unspecified diastolic (congestive) heart failure: Secondary | ICD-10-CM | POA: Diagnosis not present

## 2020-04-22 DIAGNOSIS — I10 Essential (primary) hypertension: Secondary | ICD-10-CM | POA: Insufficient documentation

## 2020-04-22 DIAGNOSIS — I340 Nonrheumatic mitral (valve) insufficiency: Secondary | ICD-10-CM | POA: Insufficient documentation

## 2020-04-22 DIAGNOSIS — Z8673 Personal history of transient ischemic attack (TIA), and cerebral infarction without residual deficits: Secondary | ICD-10-CM | POA: Insufficient documentation

## 2020-04-22 DIAGNOSIS — I517 Cardiomegaly: Secondary | ICD-10-CM | POA: Diagnosis not present

## 2020-04-22 DIAGNOSIS — Z79899 Other long term (current) drug therapy: Secondary | ICD-10-CM | POA: Insufficient documentation

## 2020-04-22 DIAGNOSIS — I4819 Other persistent atrial fibrillation: Secondary | ICD-10-CM | POA: Diagnosis not present

## 2020-04-22 DIAGNOSIS — Z79890 Hormone replacement therapy (postmenopausal): Secondary | ICD-10-CM | POA: Diagnosis not present

## 2020-04-22 DIAGNOSIS — I071 Rheumatic tricuspid insufficiency: Secondary | ICD-10-CM | POA: Insufficient documentation

## 2020-04-22 DIAGNOSIS — E876 Hypokalemia: Secondary | ICD-10-CM

## 2020-04-22 LAB — BASIC METABOLIC PANEL
Anion gap: 10 (ref 5–15)
BUN: 21 mg/dL (ref 8–23)
CO2: 26 mmol/L (ref 22–32)
Calcium: 8.8 mg/dL — ABNORMAL LOW (ref 8.9–10.3)
Chloride: 104 mmol/L (ref 98–111)
Creatinine, Ser: 1.63 mg/dL — ABNORMAL HIGH (ref 0.44–1.00)
GFR calc Af Amer: 33 mL/min — ABNORMAL LOW (ref >60)
GFR calc non Af Amer: 28 mL/min — ABNORMAL LOW (ref >60)
Glucose, Bld: 90 mg/dL (ref 70–99)
Potassium: 3.3 mmol/L — ABNORMAL LOW (ref 3.5–5.1)
Sodium: 140 mmol/L (ref 135–145)

## 2020-04-22 MED ORDER — POTASSIUM CHLORIDE CRYS ER 20 MEQ PO TBCR: 20.0000 meq | EXTENDED_RELEASE_TABLET | Freq: Two times a day (BID) | ORAL | 1 refills | Status: DC

## 2020-04-22 NOTE — Progress Notes (Signed)
Primary Care Physician: [name], Grayling Congress, DO Referring Physician:Dr. Kalilah Barua is a 86 y.o. 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 She  was started on eliquis 5 mg bid. She was back in rhythm at d/c. She recently became more symptomatic with LLE and exertional shortness of breath and was found to be in afib with Otilio Saber, PA, 6/16. She had missed some doses of her PM Eliquis so a TEE DCCV was scheduled. At the time of cardioversion, she had missed the pm dose of eliquis so the procedure was cancelled.   Today in the office, 6/23, she is in afib at 77 bpm. The daughter states that her mother has trouble with missing the pm dose of eliquis. She takes all of her other meds in the am and this is the only one that she misses because  it is by itself in the pm. It had been discussed to change to xarelto to keep from  missing doses. SHe eats a fairly sizeable breakfast so this would be ok to take in the am. We discussed antiarrythmic's, mostly amiodarone, do  not think she is releiable enough to take tikosyn. She  lives alone , has mild memory issues and her daughter's  help to supervise her. The daughter is leary of another new med at this time so will try the TEE guided cardioversion after 5 doses of xarelto 15 mg daily. She will start in the am. Daughter will remove eliquis from  the pill box and her house.   F/u in afib clinic 7/7. She had successful TEE guided cardioversion but ERAF. The TEE show mod to severe MR and TR with EF of 65-70%.  Her daughter took her to the  ER with LLE 7/1. She was diuresed and sent home. Negative for DVT. Rt leg swells more than left. I discussed antiarrythmic's with the pt, ie,  amiodarone, and the daughter is skeptical to start another drug. I do not think she is a Tikosyn pt with her dementia and issues with taking drugs. Pt is not aware of  how she felt right after cardioversion and now.   Today, she denies symptoms of palpitations, chest pain, shortness of breath, orthopnea, PND, + lower extremity edema, dizziness, presyncope, syncope, or neurologic sequela. The patient is tolerating medications without difficulties and is otherwise without complaint today.   Past Medical History:  Diagnosis Date  . Arthritis   . Atrial fibrillation (HCC)   . Hypertension   . Thyroid disease   . TIA (transient ischemic attack)    Past Surgical History:  Procedure Laterality Date  . CARDIOVERSION N/A 04/15/2020   Procedure: CARDIOVERSION;  Surgeon: Parke Poisson, MD;  Location: [facility] ENDOSCOPY;  Service: Cardiovascular;  Laterality: N/A;  . EYE SURGERY     lens implant  . IR ANGIO VERTEBRAL SEL SUBCLAVIAN INNOMINATE UNI R MOD SED  09/30/2019  . IR CT HEAD LTD  09/30/2019  . IR PERCUTANEOUS ART THROMBECTOMY/INFUSION INTRACRANIAL INC DIAG ANGIO  09/30/2019  . RADIOLOGY WITH ANESTHESIA N/A 09/30/2019   Procedure: IR WITH ANESTHESIA;  Surgeon: Julieanne Cotton, MD;  Location: [facility] OR;  Service: Radiology;  Laterality: N/A;  . TEE WITHOUT CARDIOVERSION N/A 04/15/2020   Procedure: TRANSESOPHAGEAL ECHOCARDIOGRAM (TEE);  Surgeon: Parke Poisson, MD;  Location: [facility] ENDOSCOPY;  Service: Cardiovascular;  Laterality: N/A;    Current Outpatient Medications  Medication Sig Dispense Refill  . acetaminophen (TYLENOL) 650 MG CR tablet Take 1,300 mg by mouth daily as needed for pain (arthritis).     Marland Kitchen. amLODipine (NORVASC) 2.5 MG tablet TAKE 1 TABLET BY MOUTH ONCE DAILY Do not take today 6/30 or 7/1 90 tablet 3  . diclofenac Sodium (VOLTAREN) 1 % GEL Apply 2 g topically daily as needed (pain).     Marland Kitchen. diltiazem (CARDIZEM CD) 120 MG 24 hr capsule Take 1 capsule (120 mg total) by mouth daily. Do not take 6/30 or 7/1. Hold this medication if heart rate is less than 60 beats per minute. 90 capsule 3  . fluticasone (FLONASE) 50 MCG/ACT nasal spray Place 2  sprays into both nostrils daily as needed for allergies or rhinitis.     . furosemide (LASIX) 20 MG tablet Take 2 tablets (40mg ) by mouth daily, may take and additional 20 mg as needed for swelling and edema 270 tablet 3  . Glycerin-Hypromellose-PEG 400 ([employer] DRY EYE RELIEF) 0.2-0.2-1 % SOLN Place 1 drop into both eyes daily as needed (Dry eye).    . hydrocortisone (ANUSOL-HC) 2.5 % rectal cream Place 1 application rectally daily as needed for hemorrhoids or anal itching.     . levothyroxine (SYNTHROID) 75 MCG tablet TAKE 1 TABLET(75 MCG) BY MOUTH DAILY (Patient taking differently: Take 75 mcg by mouth daily before breakfast. ) 90 tablet 3  . potassium chloride SA (KLOR-CON) 20 MEQ tablet Take 1 tablet (20 mEq total) by mouth daily. 90 tablet 1  . pravastatin (PRAVACHOL) 40 MG tablet Take 1 tablet (40 mg total) by mouth daily. 30 tablet 1  . psyllium (METAMUCIL) 0.52 g capsule Take 0.52 g by mouth daily.    . Rivaroxaban (XARELTO) 15 MG TABS tablet Take 1 tablet (15 mg total) by mouth daily with breakfast. 30 tablet 3   No current facility-administered medications for this encounter.    No Known Allergies  Social History   Socioeconomic History  . Marital status: Widowed    Spouse name: Not on file  . Number of children: 3  . Years of education: Not on file  . Highest education level: Not on file  Occupational History  . Not on file  Tobacco Use  . Smoking status: Never Smoker  . Smokeless tobacco: Never Used  Vaping Use  . Vaping Use: Never used  Substance and Sexual Activity  . Alcohol use: No  . Drug use: No  . Sexual activity: Not Currently    Partners: Male  Other Topics Concern  . Not on file  Social History Narrative   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mowing lawn, Data processing managercleaning gutters   Social Determinants of Health   Financial Resource Strain:   . Difficulty of Paying Living Expenses:   Food Insecurity:   . Worried About Programme researcher, broadcasting/film/videounning Out of Food in the Last Year:   .  Baristaan Out of Food in the Last Year:   Transportation Needs: No Transportation Needs  . Lack of Transportation (Medical): No  . Lack of Transportation (Non-Medical): No  Physical Activity:   . Days of Exercise per Week:   . Minutes of Exercise per Session:   Stress:   . Feeling of Stress :   Social Connections:   . Frequency of Communication with Friends and Family:   . Frequency of Social Gatherings with Friends and Family:   . Attends Religious Services:   . Active Member of Clubs or Organizations:   . Attends Club  or Organization Meetings:   Marland Kitchen Marital Status:   Intimate Partner Violence:   . Fear of Current or Ex-Partner:   . Emotionally Abused:   Marland Kitchen Physically Abused:   . Sexually Abused:     Family History  Problem Relation Age of Onset  . Arthritis Mother   . Heart disease Mother   . Heart disease Father 26       MI  . Coronary artery disease Other   . Diabetes Brother        DI    ROS- All systems are reviewed and negative except as per the HPI above  Physical Exam: Vitals:   04/22/20 1403  BP: 112/60  Pulse: 89  Weight: 68.1 kg  Height: 5\' 4"  (1.626 m)   Wt Readings from Last 3 Encounters:  04/22/20 68.1 kg  04/15/20 68.5 kg  04/08/20 70.6 kg    Labs: Lab Results  Component Value Date   NA 140 04/16/2020   K 3.8 04/16/2020   CL 107 04/16/2020   CO2 22 04/16/2020   GLUCOSE 97 04/16/2020   BUN 19 04/16/2020   CREATININE 1.65 (H) 04/16/2020   CALCIUM 8.6 (L) 04/16/2020   MG 2.3 04/01/2020   Lab Results  Component Value Date   INR 1.0 09/30/2019   Lab Results  Component Value Date   CHOL 117 01/30/2020   HDL 33.80 (L) 01/30/2020   LDLCALC 68 01/30/2020   TRIG 75.0 01/30/2020     GEN- The patient is well appearing, alert and oriented x 3 today.   Head- normocephalic, atraumatic Eyes-  Sclera clear, conjunctiva pink Ears- hearing intact Oropharynx- clear Neck- supple, no JVP Lymph- no cervical lymphadenopathy Lungs- Clear to ausculation  bilaterally, normal work of breathing Heart- irregular rate and rhythm, no murmurs, rubs or gallops, PMI not laterally displaced GI- soft, NT, ND, + BS Extremities- no clubbing, cyanosis, or  Trace on left, 1+ on right  MS- no significant deformity or atrophy Skin- no rash or lesion Psych- euthymic mood, full affect Neuro- strength and sensation are intact  EKG-afib/flutter  at 89  bpm, qrs int 112 ms, qtc 459  ms  Echo-1. Left ventricular ejection fraction, by visual estimation, is 65 to 70%. The left ventricle has normal function. There is moderately increased left ventricular hypertrophy. 2. Small left ventricular internal cavity size. 3. The left ventricle has no regional wall motion abnormalities. 4. Global right ventricle has normal systolic function.The right ventricular size is normal. No increase in right ventricular wall thickness. 5. Left atrial size was moderately dilated. 6. Right atrial size was normal. 7. The mitral valve is normal in structure. Mild mitral valve regurgitation. 8. The tricuspid valve is normal in structure. Tricuspid valve regurgitation is mild. 9. The aortic valve is tricuspid. Aortic valve regurgitation is not visualized. Mild to moderate aortic valve sclerosis/calcification without any evidence of aortic stenosis. 10. Moderate calcifications of AV leaflets. No stenosis. 11. The pulmonic valve was not well visualized. Pulmonic valve regurgitation is not visualized. 12. Mildly elevated pulmonary artery systolic pressure  TEE- 04/15/20- ROCEDURE: After discussion of the risks and benefits of a TEE, an  informed consent was obtained from a family member. TEE procedure time was  31 minutes. The transesophogeal probe was passed without difficulty  through the esophogus of the patient. Imaged  were obtained with the patient in a left lateral decubitus position.  Sedation performed by different physician. The patient was monitored while  under deep  sedation. Anesthestetic sedation  was provided intravenously by  Anesthesiology: 235mg  of Propofol.  Image quality was good. The patient's vital signs; including heart rate,  blood pressure, and oxygen saturation; remained stable throughout the  procedure. The patient developed no complications during the procedure. A  successful direct current  cardioversion was performed at 120J, 150J, 200J joules with 3 attempts.   1. Left ventricular ejection fraction, by estimation, is 65 to 70%. The  left ventricle has normal function. The left ventricle has no regional  wall motion abnormalities.  2. Right ventricular systolic function is normal. The right ventricular  size is mildly enlarged.  3. Left atrial size was moderately dilated. No left atrial/left atrial  appendage thrombus was detected. The LAA emptying velocity was 36 cm/s.  4. Right atrial size was severely dilated.  5. The mitral valve is degenerative. Moderate to severe mitral valve  regurgitation.  6. The tricuspid valve is degenerative. Tricuspid valve regurgitation is  moderate to severe.  7. The aortic valve is tricuspid. Aortic valve regurgitation is trivial.  Mild to moderate aortic valve sclerosis/calcification is present, without  any evidence of aortic stenosis.  8. There is Severe (Grade IV) atheroma plaque involving the transverse  and descending aorta.  9. Evidence of atrial level shunting detected by color flow Doppler.  Agitated saline contrast bubble study was positive with shunting observed  within 3-6 cardiac cycles suggestive of interatrial shunt. There is a  small patent foramen ovale with  bidirectional shunting across atrial septum.   Assessment and Plan: 1. Persistent afib Successful cardioversion but ERAF  discussed with daughter and pt Memory  issues with taking  drugs Switch to xarelto 15 mg daily at breakfast form eliquis and pt is not missing doses,  CHA2DS2VASc score of at least 6 Daughter  is hesitant to start a new drug to restore SR as not to confuse pt or potential side effects  With mod to severe MR/TR may be very difficut to restore SR   2. HTN Stable   3. Diastolic HF On lasix 60 mg daily now  Daughter gives an extra 20 mg for increased pedal edema bmet today   F/u in 2 weeks    C. Lupita Leash [facility] [address] [place], [place] [phone]

## 2020-04-23 ENCOUNTER — Ambulatory Visit: Payer: Medicare Other | Admitting: Family Medicine

## 2020-04-28 ENCOUNTER — Other Ambulatory Visit (HOSPITAL_COMMUNITY): Payer: Self-pay | Admitting: Interventional Radiology

## 2020-04-28 ENCOUNTER — Other Ambulatory Visit: Payer: Self-pay

## 2020-04-28 ENCOUNTER — Encounter: Payer: Self-pay | Admitting: Family Medicine

## 2020-04-28 ENCOUNTER — Ambulatory Visit: Payer: Medicare Other | Admitting: Family Medicine

## 2020-04-28 DIAGNOSIS — I6601 Occlusion and stenosis of right middle cerebral artery: Secondary | ICD-10-CM

## 2020-04-28 DIAGNOSIS — I10 Essential (primary) hypertension: Secondary | ICD-10-CM

## 2020-04-28 DIAGNOSIS — E785 Hyperlipidemia, unspecified: Secondary | ICD-10-CM

## 2020-04-28 DIAGNOSIS — E039 Hypothyroidism, unspecified: Secondary | ICD-10-CM

## 2020-04-28 MED ORDER — LEVOTHYROXINE SODIUM 75 MCG PO TABS: ORAL_TABLET | ORAL | 3 refills | Status: DC

## 2020-04-28 MED ORDER — PRAVASTATIN SODIUM 40 MG PO TABS: 40.0000 mg | ORAL_TABLET | Freq: Every day | ORAL | 1 refills | Status: DC

## 2020-04-28 NOTE — Patient Instructions (Signed)

## 2020-04-28 NOTE — Assessment & Plan Note (Signed)
Lab Results  Component Value Date   TSH 1.20 01/30/2020   con't synthroid

## 2020-04-28 NOTE — Assessment & Plan Note (Signed)
Well controlled, no changes to meds. Encouraged heart healthy diet such as the DASH diet and exercise as tolerated.  °

## 2020-04-28 NOTE — Progress Notes (Signed)
Patient ID: Belinda Anderson, female    DOB: 10/11/1932  Age: 86 y.o. MRN: [id number]    Subjective:  Subjective  HPI Jolan Mealor presents for f/u bp and cholesterol and thyroid.  Pt is here with her daughter.  No new complaints   Review of Systems  Constitutional: Negative for chills and fever.  HENT: Negative for congestion and hearing loss.   Eyes: Negative for discharge.  Respiratory: Negative for cough and shortness of breath.   Cardiovascular: Negative for chest pain, palpitations and leg swelling.  Gastrointestinal: Negative for abdominal pain, blood in stool, constipation, diarrhea, nausea and vomiting.  Genitourinary: Negative for dysuria, frequency, hematuria and urgency.  Musculoskeletal: Negative for back pain and myalgias.  Skin: Negative for rash.  Allergic/Immunologic: Negative for environmental allergies.  Neurological: Negative for dizziness, weakness and headaches.  Hematological: Does not bruise/bleed easily.  Psychiatric/Behavioral: Negative for suicidal ideas. The patient is not nervous/anxious.     History Past Medical History:  Diagnosis Date  . Arthritis   . Atrial fibrillation (HCC)   . Hypertension   . Thyroid disease   . TIA (transient ischemic attack)     She has a past surgical history that includes Eye surgery; Radiology with anesthesia (N/A, 09/30/2019); IR PERCUTANEOUS ART THROMBECTOMY/INFUSION INTRACRANIAL INC DIAG ANGIO (09/30/2019); IR CT Head Ltd (09/30/2019); IR ANGIO VERTEBRAL SEL SUBCLAVIAN INNOMINATE UNI R MOD SED (09/30/2019); TEE without cardioversion (N/A, 04/15/2020); and Cardioversion (N/A, 04/15/2020).   Her family history includes Arthritis in her mother; Coronary artery disease in an other family member; Diabetes in her brother; Heart disease in her mother; Heart disease (age of onset: 17) in her father.She reports that she has never smoked. She has never used smokeless tobacco. She reports that she does not drink alcohol and does  not use drugs.  Current Outpatient Medications on File Prior to Visit  Medication Sig Dispense Refill  . acetaminophen (TYLENOL) 650 MG CR tablet Take 1,300 mg by mouth daily as needed for pain (arthritis).     Marland Kitchen amLODipine (NORVASC) 2.5 MG tablet TAKE 1 TABLET BY MOUTH ONCE DAILY Do not take today 6/30 or 7/1 90 tablet 3  . diclofenac Sodium (VOLTAREN) 1 % GEL Apply 2 g topically daily as needed (pain).     Marland Kitchen diltiazem (CARDIZEM CD) 120 MG 24 hr capsule Take 1 capsule (120 mg total) by mouth daily. Do not take 6/30 or 7/1. Hold this medication if heart rate is less than 60 beats per minute. 90 capsule 3  . fluticasone (FLONASE) 50 MCG/ACT nasal spray Place 2 sprays into both nostrils daily as needed for allergies or rhinitis.     . furosemide (LASIX) 20 MG tablet Take 2 tablets (40mg ) by mouth daily, may take and additional 20 mg as needed for swelling and edema 270 tablet 3  . Glycerin-Hypromellose-PEG 400 ([employer] DRY EYE RELIEF) 0.2-0.2-1 % SOLN Place 1 drop into both eyes daily as needed (Dry eye).    . hydrocortisone (ANUSOL-HC) 2.5 % rectal cream Place 1 application rectally daily as needed for hemorrhoids or anal itching.     . potassium chloride SA (KLOR-CON) 20 MEQ tablet Take 1 tablet (20 mEq total) by mouth 2 (two) times daily. 90 tablet 1  . psyllium (METAMUCIL) 0.52 g capsule Take 0.52 g by mouth daily.    . Rivaroxaban (XARELTO) 15 MG TABS tablet Take 1 tablet (15 mg total) by mouth daily with breakfast. 30 tablet 3   No current facility-administered medications on file prior to  visit.     Objective:  Objective  Physical Exam Vitals and nursing note reviewed.  Constitutional:      Appearance: She is well-developed.  HENT:     Head: Normocephalic and atraumatic.  Eyes:     Conjunctiva/sclera: Conjunctivae normal.  Neck:     Thyroid: No thyromegaly.     Vascular: No carotid bruit or JVD.  Cardiovascular:     Rate and Rhythm: Normal rate and regular rhythm.     Heart  sounds: Normal heart sounds. No murmur heard.   Pulmonary:     Effort: Pulmonary effort is normal. No respiratory distress.     Breath sounds: Normal breath sounds. No wheezing or rales.  Chest:     Chest wall: No tenderness.  Musculoskeletal:     Cervical back: Normal range of motion and neck supple.  Neurological:     Mental Status: She is alert and oriented to person, place, and time.    BP 100/70 (BP Location: Right Arm, Patient Position: Sitting, Cuff Size: Normal)   Pulse 89   Temp 98 F (36.7 C) (Temporal)   Resp 18   Ht 5\' 4"  (1.626 m)   Wt 148 lb 9.6 oz (67.4 kg)   SpO2 98%   BMI 25.51 kg/m  Wt Readings from Last 3 Encounters:  04/28/20 148 lb 9.6 oz (67.4 kg)  04/22/20 150 lb 3.2 oz (68.1 kg)  04/15/20 151 lb (68.5 kg)     Lab Results  Component Value Date   WBC 4.8 04/16/2020   HGB 12.2 04/16/2020   HCT 39.0 04/16/2020   PLT 233 04/16/2020   GLUCOSE 90 04/22/2020   CHOL 117 01/30/2020   TRIG 75.0 01/30/2020   HDL 33.80 (L) 01/30/2020   LDLDIRECT 97.0 04/06/2018   LDLCALC 68 01/30/2020   ALT 16 01/30/2020   AST 21 01/30/2020   NA 140 04/22/2020   K 3.3 (L) 04/22/2020   CL 104 04/22/2020   CREATININE 1.63 (H) 04/22/2020   BUN 21 04/22/2020   CO2 26 04/22/2020   TSH 1.20 01/30/2020   INR 1.0 09/30/2019   HGBA1C 5.9 (H) 10/01/2019    No results found.   Assessment & Plan:  Plan  I am having 10/03/2019 "Blase Mess" maintain her acetaminophen, Metamucil, diclofenac Sodium, fluticasone, hydrocortisone, [facility]S Dry Eye Relief, furosemide, Rivaroxaban, amLODipine, diltiazem, potassium chloride SA, levothyroxine, and pravastatin.  Meds ordered this encounter  Medications  . levothyroxine (SYNTHROID) 75 MCG tablet    Sig: TAKE 1 TABLET(75 MCG) BY MOUTH DAILY    Dispense:  90 tablet    Refill:  3  . pravastatin (PRAVACHOL) 40 MG tablet    Sig: Take 1 tablet (40 mg total) by mouth daily.    Dispense:  90 tablet    Refill:  1    Problem List Items  Addressed This Visit      Unprioritized   Essential hypertension    Well controlled, no changes to meds. Encouraged heart healthy diet such as the DASH diet and exercise as tolerated.       Relevant Medications   pravastatin (PRAVACHOL) 40 MG tablet   Hyperlipidemia   Relevant Medications   pravastatin (PRAVACHOL) 40 MG tablet   Hypothyroidism    Lab Results  Component Value Date   TSH 1.20 01/30/2020   con't synthroid       Relevant Medications   levothyroxine (SYNTHROID) 75 MCG tablet      Follow-up: Return in about 6 months (around 10/29/2020)  for annual exam, fasting.  [name], DO

## 2020-04-28 NOTE — Assessment & Plan Note (Signed)
Tolerating statin, encouraged heart healthy diet, avoid trans fats, minimize simple carbs and saturated fats. Increase exercise as tolerated 

## 2020-05-06 ENCOUNTER — Other Ambulatory Visit: Payer: Self-pay

## 2020-05-06 ENCOUNTER — Ambulatory Visit (HOSPITAL_COMMUNITY)
Admission: RE | Admit: 2020-05-06 | Discharge: 2020-05-06 | Disposition: A | Discharge status: Home / Self Care | Payer: Medicare Other | Source: Ambulatory Visit | Attending: Nurse Practitioner | Admitting: Nurse Practitioner

## 2020-05-06 ENCOUNTER — Encounter (HOSPITAL_COMMUNITY): Payer: Self-pay | Admitting: Nurse Practitioner

## 2020-05-06 VITALS — BP 112/60 | HR 79 | Ht 64.0 in | Wt 150.0 lb

## 2020-05-06 DIAGNOSIS — Z7901 Long term (current) use of anticoagulants: Secondary | ICD-10-CM | POA: Diagnosis not present

## 2020-05-06 DIAGNOSIS — Z79899 Other long term (current) drug therapy: Secondary | ICD-10-CM | POA: Insufficient documentation

## 2020-05-06 DIAGNOSIS — E079 Disorder of thyroid, unspecified: Secondary | ICD-10-CM | POA: Diagnosis not present

## 2020-05-06 DIAGNOSIS — I5030 Unspecified diastolic (congestive) heart failure: Secondary | ICD-10-CM | POA: Diagnosis not present

## 2020-05-06 DIAGNOSIS — D6869 Other thrombophilia: Secondary | ICD-10-CM

## 2020-05-06 DIAGNOSIS — I4819 Other persistent atrial fibrillation: Secondary | ICD-10-CM | POA: Diagnosis present

## 2020-05-06 DIAGNOSIS — Z8673 Personal history of transient ischemic attack (TIA), and cerebral infarction without residual deficits: Secondary | ICD-10-CM | POA: Diagnosis not present

## 2020-05-06 DIAGNOSIS — I110 Hypertensive heart disease with heart failure: Secondary | ICD-10-CM | POA: Diagnosis not present

## 2020-05-06 DIAGNOSIS — M1990 Unspecified osteoarthritis, unspecified site: Secondary | ICD-10-CM | POA: Insufficient documentation

## 2020-05-06 DIAGNOSIS — Z8249 Family history of ischemic heart disease and other diseases of the circulatory system: Secondary | ICD-10-CM | POA: Diagnosis not present

## 2020-05-06 NOTE — Progress Notes (Signed)
Primary Care Physician: [name], Grayling Congress, DO Referring Physician:Dr. Jeneva Schweizer is a 86 y.o. 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 She  was started on eliquis 5 mg bid. She was back in rhythm at d/c. She recently became more symptomatic with LLE and exertional shortness of breath and was found to be in afib with Otilio Saber, PA, 6/16. She had missed some doses of her PM Eliquis so a TEE DCCV was scheduled. At the time of cardioversion, she had missed the pm dose of eliquis so the procedure was cancelled.   Today in the office, 6/23, she is in afib at 77 bpm. The daughter states that her mother has trouble with missing the pm dose of eliquis. She takes all of her other meds in the am and this is the only one that she misses because  it is by itself in the pm. It had been discussed to change to xarelto to keep from  missing doses. SHe eats a fairly sizeable breakfast so this would be ok to take in the am. We discussed antiarrythmic's, mostly amiodarone, do  not think she is releiable enough to take tikosyn. She  lives alone , has mild memory issues and her daughter's  help to supervise her. The daughter is leary of another new med at this time so will try the TEE guided cardioversion after 5 doses of xarelto 15 mg daily. She will start in the am. Daughter will remove eliquis from  the pill box and her house.   F/u in afib clinic 7/7. She had successful TEE guided cardioversion but ERAF. The TEE show mod to severe MR and TR with EF of 65-70%.  Her daughter took her to the  ER with LLE 7/1. She was diuresed and sent home. Negative for DVT. Rt leg swells more than left. I discussed antiarrythmic's with the pt, ie,  amiodarone, and the daughter is skeptical to start another drug. I do not think she is a Tikosyn pt with her dementia and issues with taking drugs. Pt is not aware of  how she felt right after cardioversion and now.   F/u in Afib  clinic 7/21. She remains  in rate controlled afib. Her LLE is stable, rt > than left, chronic. Use of antiarrythmic's to restore SR was discussed with the pt today again and they both wish to continue with rate control. Her EF by recent echo was normal, she does have mod to severe MR and TR. The daughter states that she is doing much better taking her pills now that she is on xarelto 15 mg in the am. She  was missing the pm dose of eliquis. She feels her breathing is good, but she can no walk very long distances any longer.   Today, she denies symptoms of palpitations, chest pain, shortness of breath, orthopnea, PND, + lower extremity edema, dizziness, presyncope, syncope, or neurologic sequela. The patient is tolerating medications without difficulties and is otherwise without complaint today.   Past Medical History:  Diagnosis Date  . Arthritis   . Atrial fibrillation (HCC)   . Hypertension   . Thyroid disease   . TIA (transient ischemic attack)    Past Surgical History:  Procedure Laterality Date  . CARDIOVERSION N/A 04/15/2020   Procedure: CARDIOVERSION;  Surgeon: Parke Poisson, MD;  Location: [facility] ENDOSCOPY;  Service: Cardiovascular;  Laterality: N/A;  . EYE SURGERY     lens implant  . IR ANGIO VERTEBRAL SEL SUBCLAVIAN INNOMINATE UNI R MOD SED  09/30/2019  . IR CT HEAD LTD  09/30/2019  . IR PERCUTANEOUS ART THROMBECTOMY/INFUSION INTRACRANIAL INC DIAG ANGIO  09/30/2019  . RADIOLOGY WITH ANESTHESIA N/A 09/30/2019   Procedure: IR WITH ANESTHESIA;  Surgeon: Julieanne Cotton, MD;  Location: [facility] OR;  Service: Radiology;  Laterality: N/A;  . TEE WITHOUT CARDIOVERSION N/A 04/15/2020   Procedure: TRANSESOPHAGEAL ECHOCARDIOGRAM (TEE);  Surgeon: Parke Poisson, MD;  Location: [facility] ENDOSCOPY;  Service: Cardiovascular;  Laterality: N/A;    Current Outpatient Medications  Medication Sig Dispense Refill  . acetaminophen (TYLENOL)  650 MG CR tablet Take 1,300 mg by mouth daily as needed for pain (arthritis).     Marland Kitchen amLODipine (NORVASC) 2.5 MG tablet TAKE 1 TABLET BY MOUTH ONCE DAILY Do not take today 6/30 or 7/1 90 tablet 3  . diclofenac Sodium (VOLTAREN) 1 % GEL Apply 2 g topically daily as needed (pain).     Marland Kitchen diltiazem (CARDIZEM CD) 120 MG 24 hr capsule Take 1 capsule (120 mg total) by mouth daily. Do not take 6/30 or 7/1. Hold this medication if heart rate is less than 60 beats per minute. 90 capsule 3  . fluticasone (FLONASE) 50 MCG/ACT nasal spray Place 2 sprays into both nostrils daily as needed for allergies or rhinitis.     . furosemide (LASIX) 20 MG tablet Take 2 tablets (40mg ) by mouth daily, may take and additional 20 mg as needed for swelling and edema 270 tablet 3  . Glycerin-Hypromellose-PEG 400 ([employer] DRY EYE RELIEF) 0.2-0.2-1 % SOLN Place 1 drop into both eyes daily as needed (Dry eye).    . hydrocortisone (ANUSOL-HC) 2.5 % rectal cream Place 1 application rectally daily as needed for hemorrhoids or anal itching.     . levothyroxine (SYNTHROID) 75 MCG tablet TAKE 1 TABLET(75 MCG) BY MOUTH DAILY 90 tablet 3  . potassium chloride SA (KLOR-CON) 20 MEQ tablet Take 1 tablet (20 mEq total) by mouth 2 (two) times daily. 90 tablet 1  . pravastatin (PRAVACHOL) 40 MG tablet Take 1 tablet (40 mg total) by mouth daily. 90 tablet 1  . psyllium (METAMUCIL) 0.52 g capsule Take 0.52 g by mouth daily.    . Rivaroxaban (XARELTO) 15 MG TABS tablet Take 1 tablet (15 mg total) by mouth daily with breakfast. 30 tablet 3   No current facility-administered medications for this encounter.    No Known Allergies  Social History   Socioeconomic History  . Marital status: Widowed    Spouse name: Not on file  . Number of children: 3  . Years of education: Not on file  . Highest education level: Not on file  Occupational History  . Not on file  Tobacco Use  . Smoking status: Never Smoker  . Smokeless tobacco: Never Used  Vaping  Use  . Vaping Use: Never used  Substance and Sexual Activity  . Alcohol use: No  . Drug use: No  . Sexual activity: Not Currently    Partners: Male  Other Topics Concern  . 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 Difficulty of Paying Living Expenses:   Food Insecurity:   . Worried About Data processing manager in the Last Year:   . Programme researcher, broadcasting/film/video  of Food in the Last Year:   Transportation Needs: No Transportation Needs  . Lack of Transportation (Medical): No  . Lack of Transportation (Non-Medical): No  Physical Activity:   . Days of Exercise per Week:   . Minutes of Exercise per Session:   Stress:   . Feeling of Stress :   Social Connections:   . Frequency of Communication with Friends and Family:   . Frequency of Social Gatherings with Friends and Family:   . Attends Religious Services:   . Active Member of Clubs or Organizations:   . Attends BankerClub or Organization Meetings:   Marland Kitchen. Marital Status:   Intimate Partner Violence:   . Fear of Current or Ex-Partner:   . Emotionally Abused:   Marland Kitchen. Physically Abused:   . Sexually Abused:     Family History  Problem Relation Age of Onset  . Arthritis Mother   . Heart disease Mother   . Heart disease Father 2740       MI  . Coronary artery disease Other   . Diabetes Brother        DI    ROS- All systems are reviewed and negative except as per the HPI above  Physical Exam: Vitals:   05/06/20 1411  BP: 112/60  Pulse: 79  SpO2: 98%  Weight: 68 kg  Height: 5\' 4"  (1.626 m)   Wt Readings from Last 3 Encounters:  05/06/20 68 kg  04/28/20 67.4 kg  04/22/20 68.1 kg    Labs: Lab Results  Component Value Date   NA 140 04/22/2020   K 3.3 (L) 04/22/2020   CL 104 04/22/2020   CO2 26 04/22/2020   GLUCOSE 90 04/22/2020   BUN 21 04/22/2020   CREATININE 1.63 (H) 04/22/2020   CALCIUM 8.8 (L) 04/22/2020   MG  2.3 04/01/2020   Lab Results  Component Value Date   INR 1.0 09/30/2019   Lab Results  Component Value Date   CHOL 117 01/30/2020   HDL 33.80 (L) 01/30/2020   LDLCALC 68 01/30/2020   TRIG 75.0 01/30/2020     GEN- The patient is well appearing, alert and oriented x 3 today.   Head- normocephalic, atraumatic Eyes-  Sclera clear, conjunctiva pink Ears- hearing intact Oropharynx- clear Neck- supple, no JVP Lymph- no cervical lymphadenopathy Lungs- Clear to ausculation bilaterally, normal work of breathing Heart- irregular rate and rhythm, no murmurs, rubs or gallops, PMI not laterally displaced GI- soft, NT, ND, + BS Extremities- no clubbing, cyanosis, or  Trace on left, 1+ on right  MS- no significant deformity or atrophy Skin- no rash or lesion Psych- euthymic mood, full affect Neuro- strength and sensation are intact  EKG-afib/flutter  at 89  bpm, qrs int 112 ms, qtc 459  ms  Echo-1. Left ventricular ejection fraction, by visual estimation, is 65 to 70%. The left ventricle has normal function. There is moderately increased left ventricular hypertrophy. 2. Small left ventricular internal cavity size. 3. The left ventricle has no regional wall motion abnormalities. 4. Global right ventricle has normal systolic function.The right ventricular size is normal. No increase in right ventricular wall thickness. 5. Left atrial size was moderately dilated. 6. Right atrial size was normal. 7. The mitral valve is normal in structure. Mild mitral valve regurgitation. 8. The tricuspid valve is normal in structure. Tricuspid valve regurgitation is mild. 9. The aortic valve is tricuspid. Aortic valve regurgitation is not visualized. Mild to moderate aortic valve sclerosis/calcification without any evidence of aortic stenosis.  10. Moderate calcifications of AV leaflets. No stenosis. 11. The pulmonic valve was not well visualized. Pulmonic valve regurgitation is not visualized. 12. Mildly  elevated pulmonary artery systolic pressure  TEE- 04/15/20- ROCEDURE: After discussion of the risks and benefits of a TEE, an  informed consent was obtained from a family member. TEE procedure time was  31 minutes. The transesophogeal probe was passed without difficulty  through the esophogus of the patient. Imaged  were obtained with the patient in a left lateral decubitus position.  Sedation performed by different physician. The patient was monitored while  under deep sedation. Anesthestetic sedation was provided intravenously by  Anesthesiology: 235mg  of Propofol.  Image quality was good. The patient's vital signs; including heart rate,  blood pressure, and oxygen saturation; remained stable throughout the  procedure. The patient developed no complications during the procedure. A  successful direct current  cardioversion was performed at 120J, 150J, 200J joules with 3 attempts.   1. Left ventricular ejection fraction, by estimation, is 65 to 70%. The  left ventricle has normal function. The left ventricle has no regional  wall motion abnormalities.  2. Right ventricular systolic function is normal. The right ventricular  size is mildly enlarged.  3. Left atrial size was moderately dilated. No left atrial/left atrial  appendage thrombus was detected. The LAA emptying velocity was 36 cm/s.  4. Right atrial size was severely dilated.  5. The mitral valve is degenerative. Moderate to severe mitral valve  regurgitation.  6. The tricuspid valve is degenerative. Tricuspid valve regurgitation is  moderate to severe.  7. The aortic valve is tricuspid. Aortic valve regurgitation is trivial.  Mild to moderate aortic valve sclerosis/calcification is present, without  any evidence of aortic stenosis.  8. There is Severe (Grade IV) atheroma plaque involving the transverse  and descending aorta.  9. Evidence of atrial level shunting detected by color flow Doppler.  Agitated saline  contrast bubble study was positive with shunting observed  within 3-6 cardiac cycles suggestive of interatrial shunt. There is a  small patent foramen ovale with  bidirectional shunting across atrial septum.   Assessment and Plan: 1. Persistent afib Successful cardioversion but ERAF  Discussed with daughter and pt on last visit and again today  They prefer to continue in rate controlled afib  Daughter is concerned with adding more pills  With mod to severe MR/TR may be very difficut to restore SR  Memory  issues with taking  drugs Continue  xarelto 15 mg daily at breakfast, pt is not missing doses, since switched form eliquis bid   CHA2DS2VASc score of at least 6  2. HTN Stable   3. Diastolic HF On lasix 60 mg daily now  Daughter gives an extra 20 mg for increased pedal edema Pedal edema stable today  Continue  support hose    F/u  September  9th with Dr. 07-21-1984  afib clinic as needed    Elberta Fortis. [facility] [address] [place], [place] [phone]

## 2020-05-14 ENCOUNTER — Ambulatory Visit: Payer: Self-pay | Admitting: *Deleted

## 2020-05-29 ENCOUNTER — Other Ambulatory Visit: Payer: Self-pay

## 2020-05-29 ENCOUNTER — Ambulatory Visit (HOSPITAL_COMMUNITY)
Admission: RE | Admit: 2020-05-29 | Discharge: 2020-05-29 | Disposition: A | Discharge status: Home / Self Care | Payer: Medicare Other | Source: Ambulatory Visit | Attending: Interventional Radiology | Admitting: Interventional Radiology

## 2020-05-29 DIAGNOSIS — I6601 Occlusion and stenosis of right middle cerebral artery: Secondary | ICD-10-CM | POA: Diagnosis not present

## 2020-06-02 ENCOUNTER — Other Ambulatory Visit (HOSPITAL_COMMUNITY): Payer: Self-pay | Admitting: Interventional Radiology

## 2020-06-02 DIAGNOSIS — I6601 Occlusion and stenosis of right middle cerebral artery: Secondary | ICD-10-CM

## 2020-06-08 ENCOUNTER — Other Ambulatory Visit: Payer: Self-pay | Admitting: Family Medicine

## 2020-06-08 DIAGNOSIS — E876 Hypokalemia: Secondary | ICD-10-CM

## 2020-06-10 ENCOUNTER — Ambulatory Visit (HOSPITAL_COMMUNITY)
Admission: RE | Admit: 2020-06-10 | Discharge: 2020-06-10 | Disposition: A | Discharge status: Home / Self Care | Payer: Medicare Other | Source: Ambulatory Visit | Attending: Interventional Radiology | Admitting: Interventional Radiology

## 2020-06-10 ENCOUNTER — Other Ambulatory Visit: Payer: Self-pay

## 2020-06-10 DIAGNOSIS — I6601 Occlusion and stenosis of right middle cerebral artery: Secondary | ICD-10-CM

## 2020-06-11 NOTE — Consult Note (Addendum)
Chief Complaint: Patient was seen in consultation today to discuss findings of a follow-up of MRA of the brain Referring Physician(s): Faiz Weber  Supervising PhysicianHistory of Present Illness: Belinda Anderson is a 86 y.o. 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  The patient is accompanied by her daughter.  From a clinical standpoint the patient reports occasional episodes of vertebrobasilar ischemia such as blurred vision, and unsteady balance usually while looking up.  These are not associated with any other symptoms of nausea vomiting loss of consciousness or loss of lower extremity function.  According to the daughter the patient been able to adjust  activity to prevent this from happening.  Otherwise according to patient and her daughter, patient has been doing reasonably well living independently and able to maintain near regular daily level of activities..  The patient does exhibit symptoms of dyspnea on walking which is restricted level of activity.  She does not need oxygen support at this time.  She denies any of apnea, or paroxysmal nocturnal dyspnea, or palpitations.  She does have intermittent swelling of her feet right greater than left which is chronic. Past Medical History:  Diagnosis Date  . Arthritis   . Atrial fibrillation (HCC)   . Hypertension   . Thyroid disease   . TIA (transient ischemic attack)     Past Surgical History:  Procedure Laterality Date  . CARDIOVERSION N/A 04/15/2020   Procedure: CARDIOVERSION;  Surgeon: Parke Poisson, MD;  Location: [facility] ENDOSCOPY;  Service: Cardiovascular;  Laterality: N/A;  . EYE SURGERY     lens implant  . IR ANGIO VERTEBRAL SEL SUBCLAVIAN INNOMINATE UNI R MOD SED  09/30/2019  . IR CT HEAD LTD  09/30/2019  . IR PERCUTANEOUS ART THROMBECTOMY/INFUSION INTRACRANIAL INC DIAG ANGIO  09/30/2019    . RADIOLOGY WITH ANESTHESIA N/A 09/30/2019   Procedure: IR WITH ANESTHESIA;  Surgeon: Julieanne Cotton, MD;  Location: [facility] OR;  Service: Radiology;  Laterality: N/A;  . TEE WITHOUT CARDIOVERSION N/A 04/15/2020   Procedure: TRANSESOPHAGEAL ECHOCARDIOGRAM (TEE);  Surgeon: Parke Poisson, MD;  Location[facility] ENDOSCOPY;  Service: Cardiovascular;  Laterality: N/A;    Allergies: Patient has no known allergies.  Medications: Prior to Admission medications   Medication Sig Start Date End Date Taking? Authorizing Provider  acetaminophen (TYLENOL) 650 MG CR tablet Take 1,300 mg by mouth daily as needed for pain (arthritis).     [provider]  amLODipine (NORVASC) 2.5 MG tablet TAKE 1 TABLET BY MOUTH ONCE DAILY Do not take today 6/30 or 7/1 04/15/20   Parke Poisson, MD  diclofenac Sodium (VOLTAREN) 1 % GEL Apply 2 g topically daily as needed (pain).     [provider]  diltiazem (CARDIZEM CD) 120 MG 24 hr capsule Take 1 capsule (120 mg total) by mouth daily. Do not take 6/30 or 7/1. Hold this medication if heart rate is less than 60 beats per minute. 04/15/20   Parke Poisson, MD  fluticasone Aleda Grana) 50 MCG/ACT nasal spray Place 2 sprays into both nostrils daily as needed for allergies or rhinitis.     [provider]  furosemide (LASIX) 20 MG tablet Take 2 tablets (40mg ) by mouth daily, may take and additional 20 mg as needed for swelling and edema 04/01/20   04/03/20, PA-C  Glycerin-Hypromellose-PEG 400 ([employer] DRY EYE RELIEF) 0.2-0.2-1 % SOLN Place 1 drop into both eyes daily  as needed (Dry eye).    [provider]  hydrocortisone (ANUSOL-HC) 2.5 % rectal cream Place 1 application rectally daily as needed for hemorrhoids or anal itching.     [provider]  levothyroxine (SYNTHROID) 75 MCG tablet TAKE 1 TABLET(75 MCG) BY MOUTH DAILY 04/28/20   [name], Grayling Congress, DO  potassium chloride SA (KLOR-CON) 20 MEQ tablet TAKE 1  TABLET(20 MEQ) BY MOUTH DAILY 06/09/20   [name], Grayling Congress, DO  pravastatin (PRAVACHOL) 40 MG tablet Take 1 tablet (40 mg total) by mouth daily. 04/28/20   Seabron Spates R, DO  psyllium (METAMUCIL) 0.52 g capsule Take 0.52 g by mouth daily.    [provider]  Rivaroxaban (XARELTO) 15 MG TABS tablet Take 1 tablet (15 mg total) by mouth daily with breakfast. 04/08/20   Newman Nip, NP     Family History  Problem Relation Age of Onset  . Arthritis Mother   . Heart disease Mother   . Heart disease Father 15       MI  . Coronary artery disease Other   . Diabetes Brother        DI    Social History   Socioeconomic History  . Marital status: Widowed    Spouse name: Not on file  . Number of children: 3  . Years of education: Not on file  . Highest education level: Not on file  Occupational History  . Not on file  Tobacco Use  . Smoking status: Never Smoker  . Smokeless tobacco: Never Used  Vaping Use  . Vaping Use: Never used  Substance and Sexual Activity  . Alcohol use: No  . Drug use: No  . Sexual activity: Not Currently    Partners: Male  Other Topics Concern  . 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 Difficulty of Paying Living Expenses: Not on file  Food Insecurity:   . Worried About Programme researcher, broadcasting/film/video in the Last Year: Not on file  . Ran Out of Food in the Last Year: Not on file  Transportation Needs: No Transportation Needs  . Lack of Transportation (Medical): No  . Lack of Transportation (Non-Medical): No  Physical Activity:   . Days of Exercise per Week: Not on file  . Minutes of Exercise per Session: Not on file  Stress:   . Feeling of Stress : Not on file  Social Connections:   . Frequency of Communication with Friends and Family: Not on file  . Frequency of Social Gatherings with Friends and Family:  Not on file  . Attends Religious Services: Not on file  . Active Member of Clubs or Organizations: Not on file  . Attends Banker Meetings: Not on file  . Marital Status: Not on file     Review of Systems: A 12 point ROS discussed and pertinent positives are indicated in the HPI above.  All other systems are negative.  Review of Systems.  Negative unless as mentioned above  Vital Signs: There were no vitals taken for this visit.  Physical Exam.  The patient is seen sitting comfortably with NAD distress.  She does have difficulty speaking due to her dyspnea.  Otherwise she is alert awake oriented to time place space.  Neurologically she is nonfocal with good strength in the upper and lower  extremities with minimal weakness in the left lower extremity.  Her coordination to finger-nose is normal.  Visual fields are grossly intact..   Imaging: MR ANGIO HEAD WO CONTRAST  Result Date: 05/29/2020 CLINICAL DATA:  Follow-up right middle cerebral artery aneurysm. EXAM: MRA HEAD WITHOUT CONTRAST TECHNIQUE: Angiographic images of the Circle of Willis were obtained using MRA technique without intravenous contrast. COMPARISON:  MRI head 09/30/2019.  CT angiography 09/30/2019. FINDINGS: The study suffers from motion degradation. Both internal carotid arteries are widely patent through the skull base and siphon regions. The anterior and middle cerebral vessels are patent. No convincing stenosis identified on this motion degraded exam. Known right MCA branch vessel stenosis is more distal than can not be accurately evaluated. Both vertebral arteries are patent to the basilar. Proximal basilar fenestration as seen previously. 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 The overall configuration appears similar to the previous CT angiography. No distal basilar stenosis. Flow present in both superior cerebellar and posterior cerebral arteries.  IMPRESSION: 1. Motion degraded exam. Known right MCA branch vessel stenosis is more distal than can not be accurately evaluated. 2. No change in right posteroinferior cerebellar artery region aneurysm measuring up to 5.5 mm in length. Electronically Signed   By: Paulina FusiMark  Shogry M.D.   On: 05/29/2020 15:55    Labs:  CBC: Recent Labs    10/08/19 1525 12/02/19 1739 04/01/20 1057 04/15/20 0951 04/16/20 1240  WBC 6.9 6.2 6.3  --  4.8  HGB 13.0 12.6 12.1 12.2 12.2  HCT 39.7 36.9 38.0 36.0 39.0  PLT 307.0 221 221  --  233    COAGS: Recent Labs    09/30/19 0815  INR 1.0  APTT 25    BMP: Recent Labs    10/08/19 1525 12/02/19 1739 12/02/19 1739 01/30/20 1110 04/01/20 1057 04/15/20 0951 04/16/20 1240 04/22/20 1459  NA   < > 144   < > 140 143 140 140 140  K   < > 4.2   < > 3.9 4.0 3.4* 3.8 3.3*  CL   < > 104   < > 104 101 104 107 104  CO2   < > 26  --  28 24  --  22 26  GLUCOSE   < > 104*   < > 92 76 98 97 90  BUN   < > 22   < > 26* 23 23 19 21   CALCIUM   < > 9.3  --  8.9 9.0  --  8.6* 8.8*  CREATININE   < > 1.75*   < > 1.64* 1.58* 1.70* 1.65* 1.63*  GFRNONAA  --  26*  --   --  30*  --  28* 28*  GFRAA  --  30*  --   --  34*  --  32* 33*   < > = values in this interval not displayed.    LIVER FUNCTION TESTS: Recent Labs    09/30/19 0815 10/08/19 1525 12/02/19 1739 01/30/20 1110  BILITOT 0.8 0.5 0.4 0.8  AST 23 17 21 21   ALT 13 14 15 16   ALKPHOS 104 114 159* 153*  PROT 6.6 6.7 6.1 6.1  ALBUMIN 3.9 4.2 4.0 4.0    TUMOR MARKERS: No results for input(s): AFPTM, CEA, CA199, CHROMGRNA in the last 8760 hours.  Assessment and Plan: The patient is regarding recent MRI of the brain was reviewed and he compared to the previous neuroimaging studies.  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  Compared to prior studies this  remains stable in terms of size and morphology.  The right midsternal area is  grossly patent l all this slightly motion degraded MRI examination.  Both the patient and the daughter were informed that given the patient's significant respiratory difficulties, and given the stability of the right PICA aneurysm, it was best to continue with neuroimaging surveillance with an MRA examination in 6 months time.  Should the patient follow-up exclusion headaches nausea vomiting, mild aggressive measure in terms of endovascular treatment of the aneurysm can be a viable option.  In the meantime she has been advised to continue with close follow-up with her cardiologist and primary care. All questions answered and concerns addressed. Patient conveys understanding and agreement with above management plan.    Thank you for this interesting consult.  I greatly enjoyed meeting Myley Bahner and look forward to participating in their care.  A copy of this report was sent to the requesting provider on this date.  Electronically Signed: Oneal Grout, MD 06/11/2020, 11:13 AM   I spent a total of 30 minutes in face to face in clinical consultation, greater than 50% of which was counseling/coordinating care and discussion regarding management of and discussing and reviewing the patient's medical electronic charts and imaging studies.

## 2020-06-18 ENCOUNTER — Other Ambulatory Visit: Payer: Self-pay | Admitting: *Deleted

## 2020-06-18 NOTE — Patient Outreach (Signed)
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 [facility] RN CM left HIPAA [facility] Portability and Accountability Act) compliant voicemail message along with CM's contact info.   Plan: [facility] RN CM scheduled this [facility] engaged patient for a second call attempt within 7-10 business days    Severiano Utsey L. Noelle Penner, RN, BSN, CCM [facility] Telephonic Care Management Care Coordinator Office number [phone] Mobile number [phone]  Main [facility] number [phone] Fax number [phone]

## 2020-06-23 ENCOUNTER — Other Ambulatory Visit: Payer: Self-pay | Admitting: *Deleted

## 2020-06-25 ENCOUNTER — Ambulatory Visit: Payer: Medicare Other | Admitting: Cardiology

## 2020-06-25 ENCOUNTER — Other Ambulatory Visit: Payer: Self-pay

## 2020-06-25 ENCOUNTER — Other Ambulatory Visit: Payer: Self-pay | Admitting: *Deleted

## 2020-06-25 ENCOUNTER — Encounter: Payer: Self-pay | Admitting: Cardiology

## 2020-06-25 VITALS — BP 102/72 | HR 102 | Ht 64.0 in | Wt 148.4 lb

## 2020-06-25 DIAGNOSIS — I4819 Other persistent atrial fibrillation: Secondary | ICD-10-CM | POA: Diagnosis not present

## 2020-06-25 MED ORDER — DILTIAZEM HCL ER COATED BEADS 180 MG PO CP24
180.0000 mg | ORAL_CAPSULE | Freq: Every day | ORAL | 3 refills | Status: DC
Start: 2020-06-25 — End: 2020-10-22

## 2020-06-25 NOTE — Progress Notes (Signed)
Electrophysiology Office Note   Date:  06/25/2020   ID:  Zurri Rudden, DOB Jun 08, 1932, MRN [id number]  PCP:  [name], Grayling Congress, DO  Cardiologist:   Primary Electrophysiologist:  Krisi Azua Jorja Loa, MD    Chief Complaint: Cryptogenic stroke   History of Present Illness: Belinda Anderson is a 86 y.o. female who is being seen today for the evaluation of cryptogenic stroke at the request of [name], *. Presenting today for electrophysiology evaluation.  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  She is currently on Xarelto.  She had a TEE guided cardioversion, but unfortunately had to rest.  She was diuresed and sent home.  She does have a history of dementia.  She was follow-up in A. fib clinic 7/21 controlled atrial fibrillation.   Today, she denies symptoms of palpitations, chest pain, orthopnea, PND, lower extremity edema, claudication, dizziness, presyncope, syncope, bleeding, or neurologic sequela. The patient is tolerating medications without difficulties.  Her main symptoms today are shortness of breath and fatigue.  In discussing with her family, they do not want her on more medications to control the atrial fibrillation, and also do not wish her to have another cardioversion.   Past Medical History:  Diagnosis Date  . Arthritis   . Atrial fibrillation (HCC)   . Hypertension   . Thyroid disease   . TIA (transient ischemic attack)    Past Surgical History:  Procedure Laterality Date  . CARDIOVERSION N/A 04/15/2020   Procedure: CARDIOVERSION;  Surgeon: Parke Poisson, MD;  Location: [facility] ENDOSCOPY;  Service: Cardiovascular;  Laterality: N/A;  . EYE SURGERY     lens implant  . IR ANGIO VERTEBRAL SEL SUBCLAVIAN INNOMINATE UNI R MOD SED  09/30/2019  . IR CT HEAD LTD  09/30/2019  . IR PERCUTANEOUS ART THROMBECTOMY/INFUSION  INTRACRANIAL INC DIAG ANGIO  09/30/2019  . RADIOLOGY WITH ANESTHESIA N/A 09/30/2019   Procedure: IR WITH ANESTHESIA;  Surgeon: Julieanne Cotton, MD;  Location[facility] OR;  Service: Radiology;  Laterality: N/A;  . TEE WITHOUT CARDIOVERSION N/A 04/15/2020   Procedure: TRANSESOPHAGEAL ECHOCARDIOGRAM (TEE);  Surgeon: Parke Poisson, MD;  Location: [facility] ENDOSCOPY;  Service: Cardiovascular;  Laterality: N/A;     Current Outpatient Medications  Medication Sig Dispense Refill  . acetaminophen (TYLENOL) 650 MG CR tablet Take 1,300 mg by mouth daily as needed for pain (arthritis).     Marland Kitchen diclofenac Sodium (VOLTAREN) 1 % GEL Apply 2 g topically daily as needed (pain).     . furosemide (LASIX) 20 MG tablet Take 2 tablets (40mg ) by mouth daily, may take and additional 20 mg as needed for swelling and edema 270 tablet 3  . Glycerin-Hypromellose-PEG 400 ([facility] DRY EYE RELIEF) 0.2-0.2-1 % SOLN Place 1 drop into both eyes daily as needed (Dry eye).    levothyroxine (SYNTHROID) 75 MCG tablet TAKE 1 TABLET(75 MCG) BY MOUTH DAILY 90 tablet 3  . potassium chloride SA (KLOR-CON) 20 MEQ tablet Take 40 mEq by mouth daily.    . psyllium (METAMUCIL) 0.52 g capsule Take 0.52 g by mouth daily.    . Rivaroxaban (XARELTO) 15 MG TABS tablet Take 1 tablet (15 mg total) by mouth daily with breakfast. 30 tablet 3  . diltiazem (CARDIZEM CD) 180 MG 24 hr capsule Take 1 capsule (180 mg total) by mouth daily. 30 capsule 3   No current facility-administered medications for this  visit.    Allergies:   Patient has no known allergies.   Social History:  The patient  reports that she has never smoked. She has never used smokeless tobacco. She reports that she does not drink alcohol and does not use drugs.   Family History:  The patient's family history includes Arthritis in her mother; Coronary artery disease in an other family member; Diabetes in her brother; Heart disease in her mother; Heart disease (age of onset: 28) in her  father.    ROS:  Please see the history of present illness.   Otherwise, review of systems is positive for none.   All other systems are reviewed and negative.    PHYSICAL EXAM: VS:  BP 102/72   Pulse (!) 102   Ht 5\' 4"  (1.626 m)   Wt 148 lb 6.4 oz (67.3 kg)   SpO2 94%   BMI 25.47 kg/m  , BMI Body mass index is 25.47 kg/m. GEN: Well nourished, well developed, in no acute distress  HEENT: normal  Neck: no JVD, carotid bruits, or masses Cardiac: irregular; no murmurs, rubs, or gallops,no edema  Respiratory:  clear to auscultation bilaterally, normal work of breathing GI: soft, nontender, nondistended, + BS MS: no deformity or atrophy  Skin: warm and dry Neuro:  Strength and sensation are intact Psych: euthymic mood, full affect  EKG:  EKG is ordered today. Personal review of the ekg ordered shows atrial fibrillation heart rate 101  Recent Labs: 01/30/2020: ALT 16; TSH 1.20 04/01/2020: Magnesium 2.3 04/16/2020: B Natriuretic Peptide 487.2; Hemoglobin 12.2; Platelets 233 04/22/2020: BUN 21; Creatinine, Ser 1.63; Potassium 3.3; Sodium 140    Lipid Panel     Component Value Date/Time   CHOL 117 01/30/2020 1110   TRIG 75.0 01/30/2020 1110   HDL 33.80 (L) 01/30/2020 1110   CHOLHDL 3 01/30/2020 1110   VLDL 15.0 01/30/2020 1110   LDLCALC 68 01/30/2020 1110   LDLDIRECT 97.0 04/06/2018 1029     Wt Readings from Last 3 Encounters:  06/25/20 148 lb 6.4 oz (67.3 kg)  05/06/20 150 lb (68 kg)  04/28/20 148 lb 9.6 oz (67.4 kg)      Other studies Reviewed: Additional studies/ records that were reviewed today include: TTE 04/15/2020 Review of the above records today demonstrates:  1. Left ventricular ejection fraction, by estimation, is 65 to 70%. The  left ventricle has normal function. The left ventricle has no regional  wall motion abnormalities.  2. Right ventricular systolic function is normal. The right ventricular  size is mildly enlarged.  3. Left atrial size was  moderately dilated. No left atrial/left atrial  appendage thrombus was detected. The LAA emptying velocity was 36 cm/s.  4. Right atrial size was severely dilated.  5. The mitral valve is degenerative. Moderate to severe mitral valve  regurgitation.  6. The tricuspid valve is degenerative. Tricuspid valve regurgitation is  moderate to severe.  7. The aortic valve is tricuspid. Aortic valve regurgitation is trivial.  Mild to moderate aortic valve sclerosis/calcification is present, without  any evidence of aortic stenosis.  8. There is Severe (Grade IV) atheroma plaque involving the transverse  and descending aorta.  9. Evidence of atrial level shunting detected by color flow Doppler.  Agitated saline contrast bubble study was positive with shunting observed  within 3-6 cardiac cycles suggestive of interatrial shunt. There is a  small patent foramen ovale with  bidirectional shunting across atrial septum.    ASSESSMENT AND PLAN:  1.  Persistent atrial fibrillation: Currently on Xarelto with a CHA2DS2-VASc of 6.  Has had a cardioversion but unfortunately had ERAF.  Family has preferred to remain in rate controlled atrial fibrillation.  Unfortunately her heart rates are fast, in the low 100s today.  She has symptoms of shortness of breath.  We Kohan Azizi increase her diltiazem to 180 mg.  2.  Hypertension: Blood pressure is low today.  She is currently on amlodipine which we Nelline Lio stop.  This may allow [place] to increase her diltiazem more aggressively.  3.  Diastolic heart failure on Lasix.  4.  Hyperlipidemia: Patient's family wishes to decrease necessary medications.  We Jamarl Pew thus stop her pravastatin.  If she discuss this more with her primary physician and was to started back, can be adjusted by them.  Current medicines are reviewed at length with the patient today.   The patient does not have concerns regarding her medicines.  The following changes were made today: Increase diltiazem,  stop amlodipine, pravastatin  Labs/ tests ordered today include:  Orders Placed This Encounter  Procedures  . EKG 12-Lead     Disposition:   FU with Jayleena Stille 3 months  Signed, Fayette Gasner Jorja Loa, MD  06/25/2020 11:41 AM     [facility] [address] [place] [address] [phone] (office) [phone] (fax)

## 2020-06-25 NOTE — Patient Instructions (Signed)
Medication Instructions:  Your physician has recommended you make the following change in your medication:  1. STOP Amlodipine 2. STOP Pravastatin 2. INCREASE Diltiazem to 180 mg once daily  *If you need a refill on your cardiac medications before your next appointment, please call your pharmacy*   Lab Work: None ordered   Testing/Procedures: None ordered   Follow-Up: At [facility], you and your health needs are our priority.  As part of our continuing mission to provide you with exceptional heart care, we have created designated Provider Care Teams.  These Care Teams include your primary Cardiologist (physician) and Advanced Practice Providers (APPs -  Physician Assistants and Nurse Practitioners) who all work together to provide you with the care you need, when you need it.  We recommend signing up for the patient portal called "MyChart".  Sign up information is provided on this After Visit Summary.  MyChart is used to connect with patients for Virtual Visits (Telemedicine).  Patients are able to view lab/test results, encounter notes, upcoming appointments, etc.  Non-urgent messages can be sent to your provider as well.   To learn more about what you can do with MyChart, go to [email or web address].    Your next appointment:   3 month(s)  The format for your next appointment:   In Person  Provider:   You will see one of the following Advanced Practice Providers on your designated Care Team:    Gypsy Balsam, NP  Francis Dowse, PA-C  Casimiro Needle "[place]" [place], [place]    Thank you for choosing [facility]!!   Dory Horn, RN [phone]    Other Instructions

## 2020-06-26 ENCOUNTER — Ambulatory Visit: Payer: Self-pay | Admitting: *Deleted

## 2020-07-06 ENCOUNTER — Other Ambulatory Visit: Payer: Self-pay | Admitting: *Deleted

## 2020-07-06 NOTE — Patient Outreach (Signed)
[facility]) Care Management  07/06/2020  Belinda Anderson 01/07/1932 [id number]  Unsuccessful outreach attempt made to patient. RN Health Coach left HIPAA compliant voicemail message along with her contact information.  Plan: RN Health Coach will call patient within the month of October.  Blanchie Serve RN, BSN [facility] Care Management  RN Health Coach [phone] [email or web address]

## 2020-07-11 ENCOUNTER — Other Ambulatory Visit (HOSPITAL_COMMUNITY): Payer: Self-pay | Admitting: Nurse Practitioner

## 2020-07-13 NOTE — Telephone Encounter (Signed)
Prescription refill request for Xarelto received.   Last office visit: [place], 06/25/2020 Weight: 67.3 kg Age: 86 y.o. Scr: 1.63, 04/22/2020 CrCl: 26 ml/min   Prescription refill sent.

## 2020-07-24 ENCOUNTER — Telehealth: Payer: Self-pay | Admitting: Cardiology

## 2020-07-24 ENCOUNTER — Other Ambulatory Visit: Payer: Self-pay | Admitting: *Deleted

## 2020-07-24 MED ORDER — POTASSIUM CHLORIDE CRYS ER 20 MEQ PO TBCR: 40.0000 meq | EXTENDED_RELEASE_TABLET | Freq: Every day | ORAL | 1 refills | Status: DC

## 2020-07-24 NOTE — Telephone Encounter (Signed)
Pt c/o medication issue:  1. Name of Medication: potassium chloride SA (KLOR-CON) 20 MEQ tablet  2. How are you currently taking this medication (dosage and times per day)? 2 tablet by mouth daily   3. Are you having a reaction (difficulty breathing--STAT)? No   4. What is your medication issue? Belinda Anderson states she is needing a new prescription sent to the pharmacy for her to take two tablets daily instead of one. She is requesting a 90 day supply goes to the pharmacy [facility] [phone] - [place], [place] - [address] Please advise.

## 2020-07-27 ENCOUNTER — Other Ambulatory Visit: Payer: Self-pay | Admitting: Cardiology

## 2020-08-13 ENCOUNTER — Other Ambulatory Visit: Payer: Self-pay | Admitting: *Deleted

## 2020-08-13 NOTE — Patient Outreach (Signed)
[facility]) Care Management  08/13/2020  Belinda Anderson [birth year] [id number]  Unsuccessful outreach attempt made to patient. RN Health Coach left HIPAA compliant voicemail message along with her contact information.  Plan: RN Health Coach will call patient within the month of November.  Blanchie Serve RN, BSN [facility] Care Management  RN Health Coach [phone] [name]

## 2020-08-14 ENCOUNTER — Other Ambulatory Visit (HOSPITAL_BASED_OUTPATIENT_CLINIC_OR_DEPARTMENT_OTHER): Payer: Self-pay | Admitting: Internal Medicine

## 2020-08-14 ENCOUNTER — Ambulatory Visit: Payer: Medicare Other | Attending: Internal Medicine

## 2020-08-14 DIAGNOSIS — Z23 Encounter for immunization: Secondary | ICD-10-CM

## 2020-08-17 NOTE — Progress Notes (Signed)
   Covid-19 Vaccination Clinic  Name:  Belinda Anderson    MRN: [id number] DOB: [birth year]  08/17/2020  [name] was observed post Covid-19 immunization for 15 minutes without incident. She was provided with Vaccine Information Sheet and instruction to access the V-Safe system.   [name] was instructed to call 911 with any severe reactions post vaccine: Marland Kitchen Difficulty breathing  . Swelling of face and throat  . A fast heartbeat  . A bad rash all over body  . Dizziness and weakness

## 2020-08-20 MED FILL — PFIZER-BIONTECH COVID-19 VA: 30 | 1 days supply | Qty: 0 | Fill #0

## 2020-08-31 ENCOUNTER — Encounter: Payer: Self-pay | Admitting: Family Medicine

## 2020-09-01 ENCOUNTER — Other Ambulatory Visit: Payer: Self-pay | Admitting: *Deleted

## 2020-09-01 NOTE — Telephone Encounter (Signed)
Agree with ov

## 2020-09-01 NOTE — Patient Outreach (Signed)
[facility]) Care Management  09/01/2020  Royce Sciara [birth year] [id number]  Unsuccessful outreach attempt made to patient. Patient's daughter [name] answered the phone and stated she was on her way to pick the patient up to take her to an appointment.  Nurse stated that she would call back at a later date and requested that the daughter call her if she could be of any assistance; [name] confirmed that she would contact nurse if needed.   Plan: RN Health Coach will call patient within the month of December.  Blanchie Serve RN, BSN [facility] Care Management  RN Health Coach [phone] [email or web address]

## 2020-09-02 ENCOUNTER — Encounter: Payer: Self-pay | Admitting: Family Medicine

## 2020-09-02 ENCOUNTER — Ambulatory Visit (HOSPITAL_BASED_OUTPATIENT_CLINIC_OR_DEPARTMENT_OTHER)
Admission: RE | Admit: 2020-09-02 | Discharge: 2020-09-02 | Disposition: A | Discharge status: Home / Self Care | Payer: Medicare Other | Source: Ambulatory Visit | Attending: Family Medicine | Admitting: Family Medicine

## 2020-09-02 ENCOUNTER — Telehealth (INDEPENDENT_AMBULATORY_CARE_PROVIDER_SITE_OTHER): Payer: Medicare Other | Admitting: Family Medicine

## 2020-09-02 ENCOUNTER — Other Ambulatory Visit: Payer: Self-pay

## 2020-09-02 VITALS — HR 91 | Ht 64.0 in | Wt 152.0 lb

## 2020-09-02 DIAGNOSIS — M79661 Pain in right lower leg: Secondary | ICD-10-CM

## 2020-09-02 DIAGNOSIS — M79662 Pain in left lower leg: Secondary | ICD-10-CM | POA: Diagnosis not present

## 2020-09-02 DIAGNOSIS — T148XXA Other injury of unspecified body region, initial encounter: Secondary | ICD-10-CM | POA: Diagnosis not present

## 2020-09-02 DIAGNOSIS — R600 Localized edema: Secondary | ICD-10-CM | POA: Diagnosis not present

## 2020-09-02 DIAGNOSIS — R0602 Shortness of breath: Secondary | ICD-10-CM

## 2020-09-02 LAB — CBC WITH DIFFERENTIAL/PLATELET
Basophils Absolute: 0 10*3/uL (ref 0.0–0.1)
Basophils Relative: 0.4 % (ref 0.0–3.0)
Eosinophils Absolute: 0.1 10*3/uL (ref 0.0–0.7)
Eosinophils Relative: 1.1 % (ref 0.0–5.0)
HCT: 38.5 % (ref 36.0–46.0)
Hemoglobin: 12.8 g/dL (ref 12.0–15.0)
Lymphocytes Relative: 29.9 % (ref 12.0–46.0)
Lymphs Abs: 1.6 10*3/uL (ref 0.7–4.0)
MCHC: 33.4 g/dL (ref 30.0–36.0)
MCV: 88 fl (ref 78.0–100.0)
Monocytes Absolute: 0.5 10*3/uL (ref 0.1–1.0)
Monocytes Relative: 10.2 % (ref 3.0–12.0)
Neutro Abs: 3.1 10*3/uL (ref 1.4–7.7)
Neutrophils Relative %: 58.4 % (ref 43.0–77.0)
Platelets: 289 10*3/uL (ref 150.0–400.0)
RBC: 4.37 Mil/uL (ref 3.87–5.11)
RDW: 16 % — ABNORMAL HIGH (ref 11.5–15.5)
WBC: 5.4 10*3/uL (ref 4.0–10.5)

## 2020-09-02 LAB — COMPREHENSIVE METABOLIC PANEL
ALT: 11 U/L (ref 0–35)
AST: 17 U/L (ref 0–37)
Albumin: 4 g/dL (ref 3.5–5.2)
Alkaline Phosphatase: 160 U/L — ABNORMAL HIGH (ref 39–117)
BUN: 16 mg/dL (ref 6–23)
CO2: 27 mEq/L (ref 19–32)
Calcium: 8.9 mg/dL (ref 8.4–10.5)
Chloride: 101 mEq/L (ref 96–112)
Creatinine, Ser: 1.61 mg/dL — ABNORMAL HIGH (ref 0.40–1.20)
GFR: 28.85 mL/min — ABNORMAL LOW (ref >60.00)
Glucose, Bld: 78 mg/dL (ref 70–99)
Potassium: 4.1 mEq/L (ref 3.5–5.1)
Sodium: 139 mEq/L (ref 135–145)
Total Bilirubin: 0.7 mg/dL (ref 0.2–1.2)
Total Protein: 6.5 g/dL (ref 6.0–8.3)

## 2020-09-02 LAB — PROTIME-INR
INR: 2 ratio — ABNORMAL HIGH (ref 0.8–1.0)
Prothrombin Time: 22.1 s — ABNORMAL HIGH (ref 9.6–13.1)

## 2020-09-02 LAB — D-DIMER, QUANTITATIVE: D-Dimer, Quant: 0.4 mcg/mL FEU (ref <0.50)

## 2020-09-02 LAB — APTT: aPTT: 37.9 s — ABNORMAL HIGH (ref 23.4–32.7)

## 2020-09-02 NOTE — Progress Notes (Signed)
Virtual Visit via Video Note  I connected with Blase Mess on 09/02/20 at  9:00 AM EST by a video enabled telemedicine application and verified that I am speaking with the correct person using two identifiers.  Location: Patient: home with daughter Provider: home    I discussed the limitations of evaluation and management by telemedicine and the availability of in person appointments. The patient expressed understanding and agreed to proceed.  History of Present Illness: Pt is home with her daughter c/o bruising on the top of her feet   They bought wider and bigger shoes thinking her shoes were too tight but this did not help---- they are not sure how long its been going on  She is also still sob-- but no worse than usual and her pulse ox runs 98-99% No chest pain    Observations/Objective: Vitals:   09/02/20 0902  Pulse: 91  SpO2: 98%  feet b/l----bruising on top of feet and around toes + pitting edema low legs b/l  + calf pain when her daughter squeezed them-- pt jumped Assessment and Plan: 1. Bilateral calf pain Check [place] legs stat Check labs  If symptoms worsen go to er - US Venous Img Lower Bilateral (DVT); Future - CBC with Differential/Platelet - Comprehensive metabolic panel - Protime-INR ( [facility] ONLY) - PTT - D-Dimer, Quantitative  2. Bruising Check labs  - CBC with Differential/Platelet - Comprehensive metabolic panel - Protime-INR ( [facility] ONLY) - PTT  3. SOB (shortness of breath) Really not any worse than normal but with calf pain and swelling will check d dimer  - D-Dimer, Quantitative   Follow Up Instructions:    I discussed the assessment and treatment plan with the patient. The patient was provided an opportunity to ask questions and all were answered. The patient agreed with the plan and demonstrated an understanding of the instructions.   The patient was advised to call back or seek an in-person evaluation if the symptoms worsen or if the  condition fails to improve as anticipated.  I provided 25 minutes of non-face-to-face time during this encounter.   [name], DO

## 2020-09-02 NOTE — Addendum Note (Signed)
Addended by: Seabron Spates R on: 09/02/2020 09:54 AM   Modules accepted: Orders

## 2020-09-02 NOTE — Progress Notes (Deleted)
Patient ID: Belinda Anderson, female    DOB: 09/10/1932  Age: 86 y.o. MRN: [id number]    Subjective:  Subjective  HPI Belinda Anderson presents for ***  Review of Systems  History Past Medical History:  Diagnosis Date  . Arthritis   . Atrial fibrillation (HCC)   . Hypertension   . Thyroid disease   . TIA (transient ischemic attack)     She has a past surgical history that includes Eye surgery; Radiology with anesthesia (N/A, 09/30/2019); IR PERCUTANEOUS ART THROMBECTOMY/INFUSION INTRACRANIAL INC DIAG ANGIO (09/30/2019); IR CT Head Ltd (09/30/2019); IR ANGIO VERTEBRAL SEL SUBCLAVIAN INNOMINATE UNI R MOD SED (09/30/2019); TEE without cardioversion (N/A, 04/15/2020); and Cardioversion (N/A, 04/15/2020).   Her family history includes Arthritis in her mother; Coronary artery disease in an other family member; Diabetes in her brother; Heart disease in her mother; Heart disease (age of onset: 58) in her father.She reports that she has never smoked. She has never used smokeless tobacco. She reports that she does not drink alcohol and does not use drugs.  Current Outpatient Medications on File Prior to Visit  Medication Sig Dispense Refill  . acetaminophen (TYLENOL) 650 MG CR tablet Take 1,300 mg by mouth daily as needed for pain (arthritis).     Marland Kitchen diclofenac Sodium (VOLTAREN) 1 % GEL Apply 2 g topically daily as needed (pain).     Marland Kitchen diltiazem (CARDIZEM CD) 180 MG 24 hr capsule Take 1 capsule (180 mg total) by mouth daily. 30 capsule 3  . furosemide (LASIX) 20 MG tablet Take 2 tablets (40mg ) by mouth daily, may take and additional 20 mg as needed for swelling and edema 270 tablet 3  . Glycerin-Hypromellose-PEG 400 ([facility]S DRY EYE RELIEF) 0.2-0.2-1 % SOLN Place 1 drop into both eyes daily as needed (Dry eye).    levothyroxine (SYNTHROID) 75 MCG tablet TAKE 1 TABLET(75 MCG) BY MOUTH DAILY 90 tablet 3  . potassium chloride SA (KLOR-CON) 20 MEQ tablet Take 2 tablets (40 mEq total) by mouth daily. 90  tablet 1  . psyllium (METAMUCIL) 0.52 g capsule Take 0.52 g by mouth daily.    Marland Kitchen 15 MG TABS tablet TAKE 1 TABLET(15 MG) BY MOUTH DAILY WITH BREAKFAST DISCONTINUE ELIQUIS 30 tablet 5   No current facility-administered medications on file prior to visit.     Objective:  Objective  Physical Exam Pulse 91   Ht 5\' 4"  (1.626 m)   Wt 152 lb (68.9 kg)   SpO2 98%   BMI 26.09 kg/m  Wt Readings from Last 3 Encounters:  09/02/20 152 lb (68.9 kg)  06/25/20 148 lb 6.4 oz (67.3 kg)  05/06/20 150 lb (68 kg)     Lab Results  Component Value Date   WBC 4.8 04/16/2020   HGB 12.2 04/16/2020   HCT 39.0 04/16/2020   PLT 233 04/16/2020   GLUCOSE 90 04/22/2020   CHOL 117 01/30/2020   TRIG 75.0 01/30/2020   HDL 33.80 (L) 01/30/2020   LDLDIRECT 97.0 04/06/2018   LDLCALC 68 01/30/2020   ALT 16 01/30/2020   AST 21 01/30/2020   NA 140 04/22/2020   K 3.3 (L) 04/22/2020   CL 104 04/22/2020   CREATININE 1.63 (H) 04/22/2020   BUN 21 04/22/2020   CO2 26 04/22/2020   TSH 1.20 01/30/2020   INR 1.0 09/30/2019   HGBA1C 5.9 (H) 10/01/2019    No results found.   Assessment & Plan:  Plan  I am having 10/02/2019 "10/03/2019" maintain her acetaminophen,  Metamucil, diclofenac Sodium, CVS Dry Eye Relief, furosemide, levothyroxine, diltiazem, Xarelto, and potassium chloride SA.  No orders of the defined types were placed in this encounter.   Problem List Items Addressed This Visit    None    Visit Diagnoses    Bilateral calf pain    -  Primary   Relevant Orders   US Venous Img Lower Bilateral (DVT)   CBC with Differential/Platelet   Comprehensive metabolic panel   Protime-INR ( [facility] ONLY)   PTT   Bruising       Relevant Orders   CBC with Differential/Platelet   Comprehensive metabolic panel   Protime-INR ( [facility] ONLY)   PTT      Follow-up: No follow-ups on file.  [name], DO

## 2020-09-03 ENCOUNTER — Telehealth: Payer: Self-pay

## 2020-09-03 NOTE — Telephone Encounter (Signed)
Pt's daughter is aware of u/s results. And referral.

## 2020-09-03 NOTE — Telephone Encounter (Signed)
Pts daughter is aware of lab results.

## 2020-09-03 NOTE — Telephone Encounter (Signed)
-----   Message from [name], [place] sent at 09/02/2020  5:08 PM EST ----- Pt / ptt abnormal --- refer to hematology for bruising

## 2020-09-03 NOTE — Telephone Encounter (Signed)
-----   Message from [name], [place] sent at 09/02/2020  5:07 PM EST ----- No clots With the pain and swelling would still advise more testing--- refer to vascular surgery for eval

## 2020-09-16 ENCOUNTER — Other Ambulatory Visit: Payer: Self-pay | Admitting: *Deleted

## 2020-09-16 ENCOUNTER — Encounter: Payer: Self-pay | Admitting: Family Medicine

## 2020-09-16 NOTE — Patient Outreach (Signed)
[facility]) Care Management  09/16/2020  Belinda Anderson [birth year] [id number]  Unsuccessful outreach attempt made to patient's daughter. RN Health Coach left HIPAA compliant voicemail message with daughter's husband and stated she would call back later.   Plan: RN Health Coach will call patient within the month of January.   Blanchie Serve RN, BSN [facility] Care Management  RN Health Coach [phone] [email or web address]

## 2020-09-17 ENCOUNTER — Other Ambulatory Visit: Payer: Self-pay

## 2020-09-17 DIAGNOSIS — M79661 Pain in right lower leg: Secondary | ICD-10-CM

## 2020-09-17 DIAGNOSIS — T148XXA Other injury of unspecified body region, initial encounter: Secondary | ICD-10-CM

## 2020-09-18 ENCOUNTER — Telehealth: Payer: Self-pay | Admitting: Hematology

## 2020-09-18 NOTE — Telephone Encounter (Signed)
Received a new hem referral from Dr. Laury Axon for bruising. I cld and scheduled Belinda Anderson to see Dr. Candise Che on 12/20 at 11am. Pt aware to arrive 30 minutes early.

## 2020-10-05 ENCOUNTER — Other Ambulatory Visit: Payer: Self-pay

## 2020-10-05 ENCOUNTER — Inpatient Hospital Stay: Payer: Medicare Other | Attending: Hematology | Admitting: Hematology

## 2020-10-05 VITALS — BP 192/85 | HR 57 | Temp 97.8°F | Resp 20 | Ht 64.0 in | Wt 157.4 lb

## 2020-10-05 DIAGNOSIS — M1990 Unspecified osteoarthritis, unspecified site: Secondary | ICD-10-CM | POA: Insufficient documentation

## 2020-10-05 DIAGNOSIS — I4891 Unspecified atrial fibrillation: Secondary | ICD-10-CM | POA: Insufficient documentation

## 2020-10-05 DIAGNOSIS — Z791 Long term (current) use of non-steroidal anti-inflammatories (NSAID): Secondary | ICD-10-CM | POA: Diagnosis not present

## 2020-10-05 DIAGNOSIS — Z8673 Personal history of transient ischemic attack (TIA), and cerebral infarction without residual deficits: Secondary | ICD-10-CM | POA: Diagnosis not present

## 2020-10-05 DIAGNOSIS — Z7901 Long term (current) use of anticoagulants: Secondary | ICD-10-CM | POA: Insufficient documentation

## 2020-10-05 DIAGNOSIS — D692 Other nonthrombocytopenic purpura: Secondary | ICD-10-CM | POA: Diagnosis not present

## 2020-10-05 DIAGNOSIS — N189 Chronic kidney disease, unspecified: Secondary | ICD-10-CM | POA: Insufficient documentation

## 2020-10-05 DIAGNOSIS — R233 Spontaneous ecchymoses: Secondary | ICD-10-CM

## 2020-10-05 DIAGNOSIS — Z79899 Other long term (current) drug therapy: Secondary | ICD-10-CM | POA: Diagnosis not present

## 2020-10-05 DIAGNOSIS — I872 Venous insufficiency (chronic) (peripheral): Secondary | ICD-10-CM | POA: Insufficient documentation

## 2020-10-05 DIAGNOSIS — I129 Hypertensive chronic kidney disease with stage 1 through stage 4 chronic kidney disease, or unspecified chronic kidney disease: Secondary | ICD-10-CM | POA: Diagnosis not present

## 2020-10-05 DIAGNOSIS — E079 Disorder of thyroid, unspecified: Secondary | ICD-10-CM | POA: Diagnosis not present

## 2020-10-05 DIAGNOSIS — R238 Other skin changes: Secondary | ICD-10-CM | POA: Diagnosis not present

## 2020-10-05 NOTE — Progress Notes (Signed)
HEMATOLOGY/ONCOLOGY CONSULTATION NOTE  Date of Service: 10/05/2020  Patient Care Team: [name], Grayling Congress, DO as PCP - General Regan Lemming, MD as PCP - Electrophysiology (Cardiology) Ihor Austin, NP as Nurse Practitioner (Neurology) Julieanne Cotton, MD as Consulting Physician (Interventional Radiology) Day, Dannielle Karvonen, [facility] as Pharmacist (Pharmacist) Wanda Plump, RN as [facility] Care Management  CHIEF COMPLAINTS/PURPOSE OF CONSULTATION:  Abnormal bruising  HISTORY OF PRESENTING ILLNESS:   Belinda Anderson is a wonderful 86 y.o. female who has been referred to [place] by Dr. Laury Axon for evaluation and management of abnormal bruising. Pt is accompanied today by her daughter, [name]. The pt reports that she is doing well overall.   The pt reports that she has had significant, new bruising in her feet over the last month. Pt also has chronic leg swelling. Pt notes increased bruising in her upper and lower extremities since beginning Xarelto. She has not been taking OTC pain medication, except for an occasional Tylenol Arthritis. She also uses topical Voltaren occasionally. Pt is not currently using Aspirin or fish oil. Pt has no history of nose bleeds, joint bleeds,GI bleeding, muscle hematomas or brain bleeds.   Most recent lab results (09/02/2020) of CBC w/diff & CMP is as follows: all values are WNL except for RDW at 16.0, Creatinine at 1.61, ALP at 160, GFR Est at 28.85.  On review of systems, pt reports bruising, hemorrhoidal bleeding and denies nose bleeds, gum bleeds and any other symptoms.   On PMHx the pt reports Afib, TIA, HTN, Arthritis. On Family Hx the pt reports no bleeding disorders.   MEDICAL HISTORY:  Past Medical History:  Diagnosis Date  . Arthritis   . Atrial fibrillation (HCC)   . Hypertension   . Thyroid disease   . TIA (transient ischemic attack)     SURGICAL HISTORY: Past Surgical History:  Procedure Laterality Date  .  CARDIOVERSION N/A 04/15/2020   Procedure: CARDIOVERSION;  Surgeon: Parke Poisson, MD;  Location: [facility] ENDOSCOPY;  Service: Cardiovascular;  Laterality: N/A;  . EYE SURGERY     lens implant  . IR ANGIO VERTEBRAL SEL SUBCLAVIAN INNOMINATE UNI R MOD SED  09/30/2019  . IR CT HEAD LTD  09/30/2019  . IR PERCUTANEOUS ART THROMBECTOMY/INFUSION INTRACRANIAL INC DIAG ANGIO  09/30/2019  . RADIOLOGY WITH ANESTHESIA N/A 09/30/2019   Procedure: IR WITH ANESTHESIA;  Surgeon: Julieanne Cotton, MD;  Location: [facility] OR;  Service: Radiology;  Laterality: N/A;  . TEE WITHOUT CARDIOVERSION N/A 04/15/2020   Procedure: TRANSESOPHAGEAL ECHOCARDIOGRAM (TEE);  Surgeon: Parke Poisson, MD;  Location[facility] ENDOSCOPY;  Service: Cardiovascular;  Laterality: N/A;    SOCIAL HISTORY: Social History   Socioeconomic History  . Marital status: Widowed    Spouse name: Not on file  . Number of children: 3  . Years of education: Not on file  . Highest education level: Not on file  Occupational History  . Not on file  Tobacco Use  . Smoking status: Never Smoker  . Smokeless tobacco: Never Used  Vaping Use  . Vaping Use: Never used  Substance and Sexual Activity  . Alcohol use: No  . Drug use: No  . Sexual activity: Not Currently    Partners: Male  Other Topics Concern  . 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 Lack of Transportation (Medical): No  . Lack of Transportation (Non-Medical): No  Physical Activity: Not on file  Stress: Not on file  Social Connections: Not on file  Intimate Partner Violence: Not on file    FAMILY HISTORY: Family History  Problem Relation Age of Onset  . Arthritis Mother   . Heart disease Mother   . Heart disease Father 22       MI  . Coronary artery  disease Other   . Diabetes Brother        DI    ALLERGIES:  has No Known Allergies.  MEDICATIONS:  Current Outpatient Medications  Medication Sig Dispense Refill  . acetaminophen (TYLENOL) 650 MG CR tablet Take 1,300 mg by mouth daily as needed for pain (arthritis).     Marland Kitchen diclofenac Sodium (VOLTAREN) 1 % GEL Apply 2 g topically daily as needed (pain).     Marland Kitchen diltiazem (CARDIZEM CD) 180 MG 24 hr capsule Take 1 capsule (180 mg total) by mouth daily. 30 capsule 3  . furosemide (LASIX) 20 MG tablet Take 2 tablets (40mg ) by mouth daily, may take and additional 20 mg as needed for swelling and edema 270 tablet 3  . Glycerin-Hypromellose-PEG 400 ([employer] DRY EYE RELIEF) 0.2-0.2-1 % SOLN Place 1 drop into both eyes daily as needed (Dry eye).    levothyroxine (SYNTHROID) 75 MCG tablet TAKE 1 TABLET(75 MCG) BY MOUTH DAILY 90 tablet 3  . potassium chloride SA (KLOR-CON) 20 MEQ tablet Take 2 tablets (40 mEq total) by mouth daily. 90 tablet 1  . psyllium (METAMUCIL) 0.52 g capsule Take 0.52 g by mouth daily.    Marland Kitchen 15 MG TABS tablet TAKE 1 TABLET(15 MG) BY MOUTH DAILY WITH BREAKFAST DISCONTINUE ELIQUIS 30 tablet 5   No current facility-administered medications for this visit.    REVIEW OF SYSTEMS:    10 Point review of Systems was done is negative except as noted above.  PHYSICAL EXAMINATION: ECOG PERFORMANCE STATUS: 2 - Symptomatic, <50% confined to bed  . Vitals:   10/05/20 1127  BP: (!) 192/85  Pulse: (!) 57  Resp: 20  Temp: 97.8 F (36.6 C)  SpO2: 95%   Filed Weights   10/05/20 1127  Weight: 157 lb 6.4 oz (71.4 kg)   .Body mass index is 27.02 kg/m.  Exam was given in a wheelchair   GENERAL:alert, in no acute distress and comfortable SKIN: no acute rashes, no significant lesions EYES: conjunctiva are pink and non-injected, sclera anicteric OROPHARYNX: MMM, no exudates, no oropharyngeal erythema or ulceration NECK: supple, no JVD LYMPH:  no palpable lymphadenopathy in  the cervical, axillary or inguinal regions LUNGS: clear to auscultation b/l with normal respiratory effort HEART: regular rate & rhythm ABDOMEN:  normoactive bowel sounds , non tender, not distended. Extremity: no pedal edema PSYCH: alert & oriented x 3 with fluent speech NEURO: no focal motor/sensory deficits  LABORATORY DATA:  I have reviewed the data as listed  . CBC Latest Ref Rng & Units 09/02/2020 04/16/2020 04/15/2020  WBC 4.0 - 10.5 K/uL 5.4 4.8 -  Hemoglobin 12.0 - 15.0 g/dL 04/17/2020 00.7 12.1  Hematocrit 36.0 - 46.0 % 38.5 39.0 36.0  Platelets 150.0 - 400.0 K/uL 289.0 233 -    . CMP Latest Ref Rng & Units 09/02/2020 04/22/2020 04/16/2020  Glucose 70 - 99 mg/dL 78 90 97  BUN 6 - 23 mg/dL 16 21 19   Creatinine 0.40 - 1.20 mg/dL 06/17/2020)  1.63(H) 1.65(H)  Sodium 135 - 145 mEq/L 139 140 140  Potassium 3.5 - 5.1 mEq/L 4.1 3.3(L) 3.8  Chloride 96 - 112 mEq/L 101 104 107  CO2 19 - 32 mEq/L 27 26 22   Calcium 8.4 - 10.5 mg/dL 8.9 ) 3.1(D)  Total Protein 6.0 - 8.3 g/dL 6.5 - -  Total Bilirubin 0.2 - 1.2 mg/dL 0.7 - -  Alkaline Phos 39 - 117 U/L 160(H) - -  AST 0 - 37 U/L 17 - -  ALT 0 - 35 U/L 11 - -     RADIOGRAPHIC STUDIES: I have personally reviewed the radiological images as listed and agreed with the findings in the report. No results found.  ASSESSMENT & PLAN:   1) Abnormal bruising In the setting of anticoagulation, senile purpura and chronic venous insufficiency in lower extremities. Also with CKD and her age which can have a bearing on Xarelto dosing. PLAN: -Discussed patient's most recent labs from 09/02/2020, all values are WNL except for RDW at 16.0, Creatinine at 1.61, ALP at 160, GFR Est at 28.85. -Advised pt that CKD can affect PLT function, although her PLT counts are nml.  -Advised pt that venous insufficiency can cause prominent veins and duskiness.  -Advised pt that chronic venous insufficiency can cause venous stasis dermatitis.  -Advised pt that there  will be some increased bruising with age, known as senile purpura.  -Advised pt that it is unlikely that she will have a blood clot at this time as she is currently on Xarelto. -Advised pt that there is an increased risk of bleeding in pt's >85 while on blood thinners. She is already on a reduced-dose of Xarelto @ 15mg . -Advised pt that Cardiology would have to determine if her risk of bleeding or falls is too high with her current dose of Xarelto and whether they would like to reduce the dose of Xarelto further to 10mg  po daily given her bruising issues, age and CKD.  -No concern for a genetic or acquired bleeding disorder at this time. No indication for labs today. -Recommend pt f/u with Cardiology for anticoagulation management.  -Recommend pt elevate legs & wear compression socks. -Continue f/u with PCP -Will see back as needed. NO additional hematology w/u recommended at this time. Her previous PT/aPTT cannot really be accurately interpreted in the setting of Xarelto use.   FOLLOW UP: RTC with Dr 09/04/2020 as needed   All of the patients questions were answered with apparent satisfaction. The patient knows to call the clinic with any problems, questions or concerns.  I spent 35 mins counseling the patient face to face. The total time spent in the appointment was 45 minutes and more than 50% was on counseling and direct patient cares.    MD MS AAHIVMS [facility] Hematology/Oncology Physician [facility]  (Office):       [phone] (Work cell):  [phone] (Fax):           [phone]  10/05/2020 5:09 PM  I, [phone], am acting as a scribe for Dr. [phone].   .I have reviewed the above documentation for accuracy and completeness, and I agree with the above. 10/07/2020 MD

## 2020-10-05 NOTE — Patient Instructions (Signed)
Thank you for choosing [facility] to provide your oncology and hematology care.   Should you have questions after your visit to the [facility] ([facility]), please contact this office at [phone] between 8:30 AM and 4:30 PM.  Voice mails left after 4:00 PM may not be returned until the following business day.  Calls received after 4:30 PM will be answered by an off-site Nurse Triage Line.    Prescription Refills:  Please have your pharmacy contact us directly for most prescription requests.  Contact the office directly for refills of narcotics (pain medications). Allow 48-72 hours for refills.  Appointments: Please contact the [facility] scheduling department [phone] for questions regarding [facility] appointment scheduling.  Contact the schedulers with any scheduling changes so that your appointment can be rescheduled in a timely manner.   Central Scheduling for [place] [phone] - Call to schedule procedures such as PET scans, CT scans, MRI, Ultrasound, etc.  To afford each patient quality time with our providers, please arrive 30 minutes before your scheduled appointment time.  If you arrive late for your appointment, you may be asked to reschedule.  We strive to give you quality time with our providers, and arriving late affects you and other patients whose appointments are after yours. If you are a no show for multiple scheduled visits, you may be dismissed from the clinic at the providers discretion.     Resources: [facility] Social Workers [phone] for additional information on assistance programs or assistance connecting with community support programs   [place] DSS  [phone]: Information regarding food stamps, Medicaid, and utility assistance [place] Access [place] 336-333-6589   [place] [employer]'s shared-ride transportation service for eligible riders who have a disability that prevents them from riding the fixed route bus.   Medicare  Rights Center 800-333-4114 Helps people with Medicare understand their rights and benefits, navigate the Medicare system, and secure the quality healthcare they deserve [employer] 800-227-2345 Assists patients locate various types of support and financial assistance Cancer Care: 1-800-813-HOPE (4673) Provides financial assistance, online support groups, medication/co-pay assistance.   Transportation Assistance for appointments at [facility]: Transportation Coordinator [phone]  Again, thank you for choosing  Cancer Center for your care.       

## 2020-10-12 ENCOUNTER — Other Ambulatory Visit: Payer: Self-pay | Admitting: Family Medicine

## 2020-10-12 DIAGNOSIS — E039 Hypothyroidism, unspecified: Secondary | ICD-10-CM

## 2020-10-19 ENCOUNTER — Other Ambulatory Visit: Payer: Self-pay | Admitting: Family Medicine

## 2020-10-19 DIAGNOSIS — M7989 Other specified soft tissue disorders: Secondary | ICD-10-CM

## 2020-10-20 ENCOUNTER — Other Ambulatory Visit: Payer: Self-pay | Admitting: Cardiology

## 2020-10-22 ENCOUNTER — Ambulatory Visit
Admission: RE | Admit: 2020-10-22 | Discharge: 2020-10-22 | Disposition: A | Discharge status: Home / Self Care | Payer: Medicare Other | Source: Ambulatory Visit | Attending: Cardiology | Admitting: Cardiology

## 2020-10-22 ENCOUNTER — Encounter: Payer: Self-pay | Admitting: Cardiology

## 2020-10-22 ENCOUNTER — Ambulatory Visit: Payer: Medicare Other | Admitting: Cardiology

## 2020-10-22 ENCOUNTER — Other Ambulatory Visit: Payer: Self-pay

## 2020-10-22 VITALS — BP 114/64 | HR 107 | Ht 64.0 in | Wt 156.0 lb

## 2020-10-22 DIAGNOSIS — I4821 Permanent atrial fibrillation: Secondary | ICD-10-CM | POA: Diagnosis not present

## 2020-10-22 DIAGNOSIS — R0602 Shortness of breath: Secondary | ICD-10-CM

## 2020-10-22 MED ORDER — POTASSIUM CHLORIDE CRYS ER 20 MEQ PO TBCR: 40.0000 meq | EXTENDED_RELEASE_TABLET | Freq: Every day | ORAL | 1 refills | Status: DC

## 2020-10-22 NOTE — Patient Instructions (Addendum)
Medication Instructions:  Your physician recommends that you continue on your current medications as directed. Please refer to the Current Medication list given to you today.  *If you need a refill on your cardiac medications before your next appointment, please call your pharmacy*   Lab Work: Today: BNP & BMET If you have labs (blood work) drawn today and your tests are completely normal, you will receive your results only by: Marland Kitchen MyChart Message (if you have MyChart) OR . A paper copy in the mail If you have any lab test that is abnormal or we need to change your treatment, we will call you to review the results.   Testing/Procedures: A chest x-ray takes a picture of the organs and structures inside the chest, including the heart, lungs, and blood vessels. This test can show several things, including, whether the heart is enlarges; whether fluid is building up in the lungs; and whether pacemaker / defibrillator leads are still in place.   Follow-Up: At [facility], you and your health needs are our priority.  As part of our continuing mission to provide you with exceptional heart care, we have created designated Provider Care Teams.  These Care Teams include your primary Cardiologist (physician) and Advanced Practice Providers (APPs -  Physician Assistants and Nurse Practitioners) who all work together to provide you with the care you need, when you need it.  Your next appointment:   2 week(s)  The format for your next appointment:   In Person  Provider:   Casimiro Needle "Otilio Saber, PA-C   Your physician recommends that you schedule a follow-up appointment in: 3 months with Dr. Elberta Fortis.   Thank you for choosing [facility]!!   Dory Horn, RN [phone]   Other Instructions  Chest XRay @ [facility] [address]

## 2020-10-22 NOTE — Progress Notes (Signed)
Electrophysiology Office Note   Date:  10/22/2020   ID:  Belinda Anderson, DOB 1932-08-10, MRN [id number]  PCP:  [name], [name], DO  Cardiologist:   Primary Electrophysiologist:  Azeem Poorman Meredith Leeds, MD    Chief Complaint: Cryptogenic stroke   History of Present Illness: Belinda Anderson is a 87 y.o. female who is being seen today for the evaluation of cryptogenic stroke at the request of [name], *. Presenting today for electrophysiology evaluation.  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  She is currently on Xarelto.  She had a TEE guided cardioversion, but unfortunately went back into atrial fibrillation quite quickly.  She was diuresed and sent home.  She was seen in atrial fibrillation clinic after that and had rate controlled atrial fibrillation.  Today, denies symptoms of palpitations, chest pain,  orthopnea, PND, lower extremity edema, claudication, dizziness, presyncope, syncope, bleeding, or neurologic sequela. The patient is tolerating medications without difficulties.  She has significant shortness of breath.  She is short of breath both at rest and with exertion.  The patient does sleep on a flat mattress on her side and does not wake up short of breath.  She does wake up to use the restroom and because her mouth is dry.  Per her daughter, her shortness of breath has been this way since just after Thanksgiving.  While in clinic today, she walked to the bathroom with worsening shortness of breath which did not improve when she rested in the exam room.     Past Medical History:  Diagnosis Date  . Arthritis   . Atrial fibrillation ([place])   . Hypertension   . Thyroid disease   . TIA (transient ischemic attack)    Past Surgical History:  Procedure Laterality Date  . CARDIOVERSION N/A 04/15/2020   Procedure:  CARDIOVERSION;  Surgeon: Elouise Munroe, MD;  Location: [facility] ENDOSCOPY;  Service: Cardiovascular;  Laterality: N/A;  . EYE SURGERY     lens implant  . IR ANGIO VERTEBRAL SEL SUBCLAVIAN INNOMINATE UNI R MOD SED  09/30/2019  . IR CT HEAD LTD  09/30/2019  . IR PERCUTANEOUS ART THROMBECTOMY/INFUSION INTRACRANIAL INC DIAG ANGIO  09/30/2019  . RADIOLOGY WITH ANESTHESIA N/A 09/30/2019   Procedure: IR WITH ANESTHESIA;  Surgeon: Luanne Bras, MD;  Location: [place];  Service: Radiology;  Laterality: N/A;  . TEE WITHOUT CARDIOVERSION N/A 04/15/2020   Procedure: TRANSESOPHAGEAL ECHOCARDIOGRAM (TEE);  Surgeon: Elouise Munroe, MD;  Location: [facility] ENDOSCOPY;  Service: Cardiovascular;  Laterality: N/A;     Current Outpatient Medications  Medication Sig Dispense Refill  . acetaminophen (TYLENOL) 650 MG CR tablet Take 1,300 mg by mouth daily as needed for pain (arthritis).     Marland Kitchen diclofenac Sodium (VOLTAREN) 1 % GEL Apply 2 g topically daily as needed (pain).     Marland Kitchen diltiazem (CARDIZEM CD) 180 MG 24 hr capsule TAKE 1 CAPSULE(180 MG) BY MOUTH DAILY 90 capsule 2  . furosemide (LASIX) 20 MG tablet Take 2 tablets (40mg ) by mouth daily, may take and additional 20 mg as needed for swelling and edema 270 tablet 3  . Glycerin-Hypromellose-PEG 400 ([employer] DRY EYE RELIEF) 0.2-0.2-1 % SOLN Place 1 drop into both eyes daily as needed (Dry eye).    Marland Kitchen levothyroxine (SYNTHROID) 75 MCG tablet Take 1 tablet (75 mcg total) by mouth daily before breakfast. 90 tablet 0  .  psyllium (METAMUCIL) 0.52 g capsule Take 0.52 g by mouth daily.    Carlena Hurl 15 MG TABS tablet TAKE 1 TABLET(15 MG) BY MOUTH DAILY WITH BREAKFAST DISCONTINUE ELIQUIS 30 tablet 5  . potassium chloride SA (KLOR-CON) 20 MEQ tablet Take 2 tablets (40 mEq total) by mouth daily. 90 tablet 1   No current facility-administered medications for this visit.    Allergies:   Patient has no known allergies.   Social History:  The patient  reports that she has never  smoked. She has never used smokeless tobacco. She reports that she does not drink alcohol and does not use drugs.   Family History:  The patient's family history includes Arthritis in her mother; Coronary artery disease in an other family member; Diabetes in her brother; Heart disease in her mother; Heart disease (age of onset: 49) in her father.   ROS:  Please see the history of present illness.   Otherwise, review of systems is positive for none.   All other systems are reviewed and negative.   PHYSICAL EXAM: VS:  BP 114/64   Pulse (!) 107   Ht 5\' 4"  (1.626 m)   Wt 156 lb (70.8 kg)   SpO2 93%   BMI 26.78 kg/m  , BMI Body mass index is 26.78 kg/m. GEN: Well nourished, well developed, in no acute distress  HEENT: normal  Neck: no JVD, carotid bruits, or masses Cardiac: Irregular; no murmurs, rubs, or gallops, 1-2+ edema  Respiratory:  clear to auscultation bilaterally, normal work of breathing GI: soft, nontender, nondistended, + BS MS: no deformity or atrophy  Skin: warm and dry Neuro:  Strength and sensation are intact Psych: euthymic mood, full affect  EKG:  EKG is ordered today. Personal review of the ekg ordered shows atrial fibrillation, rate 107  Recent Labs: 01/30/2020: TSH 1.20 04/01/2020: Magnesium 2.3 04/16/2020: B Natriuretic Peptide 487.2 09/02/2020: ALT 11; BUN 16; Creatinine, Ser 1.61; Hemoglobin 12.8; Platelets 289.0; Potassium 4.1; Sodium 139    Lipid Panel     Component Value Date/Time   CHOL 117 01/30/2020 1110   TRIG 75.0 01/30/2020 1110   HDL 33.80 (L) 01/30/2020 1110   CHOLHDL 3 01/30/2020 1110   VLDL 15.0 01/30/2020 1110   LDLCALC 68 01/30/2020 1110   LDLDIRECT 97.0 04/06/2018 1029     Wt Readings from Last 3 Encounters:  10/22/20 156 lb (70.8 kg)  10/05/20 157 lb 6.4 oz (71.4 kg)  09/02/20 152 lb (68.9 kg)      Other studies Reviewed: Additional studies/ records that were reviewed today include: TTE 04/15/2020 Review of the above records  today demonstrates:  1. Left ventricular ejection fraction, by estimation, is 65 to 70%. The  left ventricle has normal function. The left ventricle has no regional  wall motion abnormalities.  2. Right ventricular systolic function is normal. The right ventricular  size is mildly enlarged.  3. Left atrial size was moderately dilated. No left atrial/left atrial  appendage thrombus was detected. The LAA emptying velocity was 36 cm/s.  4. Right atrial size was severely dilated.  5. The mitral valve is degenerative. Moderate to severe mitral valve  regurgitation.  6. The tricuspid valve is degenerative. Tricuspid valve regurgitation is  moderate to severe.  7. The aortic valve is tricuspid. Aortic valve regurgitation is trivial.  Mild to moderate aortic valve sclerosis/calcification is present, without  any evidence of aortic stenosis.  8. There is Severe (Grade IV) atheroma plaque involving the transverse  and descending  aorta.  9. Evidence of atrial level shunting detected by color flow Doppler.  Agitated saline contrast bubble study was positive with shunting observed  within 3-6 cardiac cycles suggestive of interatrial shunt. There is a  small patent foramen ovale with  bidirectional shunting across atrial septum.    ASSESSMENT AND PLAN:  1.  Persistent atrial fibrillation: Currently on Xarelto with a CHA2DS2-VASc of 6.  Had a cardioversion but unfortunately had ERAF.  She remains in atrial fibrillation today.  She does have significant shortness of breath that has been going on for few months.  Her Lasix dose was increased to 40 mg daily by her family.  She does continue to have significant issues.  I am concerned that she has volume overload as the cause of her shortness of breath.  We Belinda Anderson get a basic metabolic and a BNP today as well as a chest x-ray to determine if she is having any lung issues.  2.  Hypertension: Currently well controlled  3.  Chronic diastolic heart  failure: Appears to be volume overloaded.  Is taking Lasix 40 mg.  We Belinda Anderson get a basic metabolic and BNP to make further adjustments.  Current medicines are reviewed at length with the patient today.   The patient does not have concerns regarding her medicines.  The following changes were made today: None  Labs/ tests ordered today include:  Orders Placed This Encounter  Procedures  . DG Chest 2 View  . Basic metabolic panel  . Pro b natriuretic peptide (BNP)  . EKG 12-Lead     Disposition:   FU with Belinda Anderson 3 months  Signed, Dollie Mayse Jorja Loa, MD  10/22/2020 12:05 PM     [facility] [address] [place] [address] [phone] (office) [phone] (fax)

## 2020-10-23 ENCOUNTER — Ambulatory Visit: Payer: Self-pay | Admitting: *Deleted

## 2020-10-23 LAB — BASIC METABOLIC PANEL
BUN/Creatinine Ratio: 11 — ABNORMAL LOW (ref 12–28)
BUN: 18 mg/dL (ref 8–27)
CO2: 19 mmol/L — ABNORMAL LOW (ref 20–29)
Calcium: 9.4 mg/dL (ref 8.7–10.3)
Chloride: 102 mmol/L (ref 96–106)
Creatinine, Ser: 1.62 mg/dL — ABNORMAL HIGH (ref 0.57–1.00)
GFR calc Af Amer: 33 mL/min/{1.73_m2} — ABNORMAL LOW (ref >59)
GFR calc non Af Amer: 29 mL/min/{1.73_m2} — ABNORMAL LOW (ref >59)
Glucose: 93 mg/dL (ref 65–99)
Potassium: 4.4 mmol/L (ref 3.5–5.2)
Sodium: 138 mmol/L (ref 134–144)

## 2020-10-23 LAB — PRO B NATRIURETIC PEPTIDE: NT-Pro BNP: 1896 pg/mL — ABNORMAL HIGH (ref 0–738)

## 2020-11-02 ENCOUNTER — Encounter: Payer: Medicare Other | Admitting: Family Medicine

## 2020-11-05 ENCOUNTER — Ambulatory Visit: Payer: Medicare Other | Admitting: Student

## 2020-11-05 ENCOUNTER — Other Ambulatory Visit: Payer: Self-pay

## 2020-11-05 ENCOUNTER — Ambulatory Visit: Payer: Medicare Other | Admitting: Nurse Practitioner

## 2020-11-05 ENCOUNTER — Encounter: Payer: Self-pay | Admitting: Nurse Practitioner

## 2020-11-05 VITALS — BP 116/62 | HR 89 | Ht 64.0 in | Wt 154.8 lb

## 2020-11-05 DIAGNOSIS — I5031 Acute diastolic (congestive) heart failure: Secondary | ICD-10-CM | POA: Diagnosis not present

## 2020-11-05 DIAGNOSIS — I4819 Other persistent atrial fibrillation: Secondary | ICD-10-CM

## 2020-11-05 DIAGNOSIS — D6869 Other thrombophilia: Secondary | ICD-10-CM

## 2020-11-05 NOTE — Patient Instructions (Addendum)
Medication Instructions:  *If you need a refill on your cardiac medications before your next appointment, please call your pharmacy*  Lab Work: Your physician has recommended that you have lab work today: BMET and Pro BNP If you have labs (blood work) drawn today and your tests are completely normal, you will receive your results only by: Marland Kitchen MyChart Message (if you have MyChart) OR . A paper copy in the mail If you have any lab test that is abnormal or we need to change your treatment, we will call you to review the results.  Follow-Up: At [facility], you and your health needs are our priority.  As part of our continuing mission to provide you with exceptional heart care, we have created designated Provider Care Teams.  These Care Teams include your primary Cardiologist (physician) and Advanced Practice Providers (APPs -  Physician Assistants and Nurse Practitioners) who all work together to provide you with the care you need, when you need it.  Your next appointment:   Your physician recommends that you keep your scheduled follow-up appointment on: 02/08/21 with Dr. Elberta Fortis   The format for your next appointment:   In Person with Will Elberta Fortis, MD    -- PLEASE TRY TO GET SCHEDULED TO FOLLOW UP WITH YOUR PRIMARY CARE PHYSICIAN (PCP) IN THE NEXT 2-4 WEEKS. --

## 2020-11-05 NOTE — Progress Notes (Signed)
Electrophysiology Office Note Date: 11/05/2020  ID:  Tranisha Tissue, DOB Jul 01, 1932, MRN [id number]  PCP: [name], Grayling Congress, DO Electrophysiologist: Elberta Fortis  CC: Follow up shortness of breath  Belinda Anderson is a 87 y.o. female seen today for Dr Elberta Fortis.  She presents today for routine electrophysiology followup.  Since last being seen in our clinic, the patient reports her shortness of breath and LE edema are improved.  She is seen with her daughter today who helps with history.  She has been taking extra Lasix with improvement in LE edema.  She denies chest pain, palpitations, PND, orthopnea, nausea, vomiting, dizziness, syncope, edema, weight gain, or early satiety.  Past Medical History:  Diagnosis Date  . Arthritis   . Atrial fibrillation (HCC)   . Hypertension   . Thyroid disease   . TIA (transient ischemic attack)    Past Surgical History:  Procedure Laterality Date  . CARDIOVERSION N/A 04/15/2020   Procedure: CARDIOVERSION;  Surgeon: Parke Poisson, MD;  Location: [facility] ENDOSCOPY;  Service: Cardiovascular;  Laterality: N/A;  . EYE SURGERY     lens implant  . IR ANGIO VERTEBRAL SEL SUBCLAVIAN INNOMINATE UNI R MOD SED  09/30/2019  . IR CT HEAD LTD  09/30/2019  . IR PERCUTANEOUS ART THROMBECTOMY/INFUSION INTRACRANIAL INC DIAG ANGIO  09/30/2019  . RADIOLOGY WITH ANESTHESIA N/A 09/30/2019   Procedure: IR WITH ANESTHESIA;  Surgeon: Julieanne Cotton, MD;  Location: [facility] OR;  Service: Radiology;  Laterality: N/A;  . TEE WITHOUT CARDIOVERSION N/A 04/15/2020   Procedure: TRANSESOPHAGEAL ECHOCARDIOGRAM (TEE);  Surgeon: Parke Poisson, MD;  Location: [facility] ENDOSCOPY;  Service: Cardiovascular;  Laterality: N/A;    Current Outpatient Medications  Medication Sig Dispense Refill  . acetaminophen (TYLENOL) 650 MG CR tablet Take 1,300 mg by mouth daily as needed for pain (arthritis).     Marland Kitchen diclofenac Sodium (VOLTAREN) 1 % GEL Apply 2 g topically daily as needed (pain).     Marland Kitchen  diltiazem (CARDIZEM CD) 180 MG 24 hr capsule TAKE 1 CAPSULE(180 MG) BY MOUTH DAILY 90 capsule 2  . furosemide (LASIX) 20 MG tablet Take 2 tablets (40mg ) by mouth daily, may take and additional 20 mg as needed for swelling and edema (Patient taking differently: Take 60 mg by mouth daily.) 270 tablet 3  . Glycerin-Hypromellose-PEG 400 ([facility]S DRY EYE RELIEF) 0.2-0.2-1 % SOLN Place 1 drop into both eyes daily as needed (Dry eye).    levothyroxine (SYNTHROID) 75 MCG tablet Take 1 tablet (75 mcg total) by mouth daily before breakfast. 90 tablet 0  . potassium chloride SA (KLOR-CON) 20 MEQ tablet Take 2 tablets (40 mEq total) by mouth daily. 90 tablet 1  . psyllium (METAMUCIL) 0.52 g capsule Take 0.52 g by mouth daily.    Marland Kitchen 15 MG TABS tablet TAKE 1 TABLET(15 MG) BY MOUTH DAILY WITH BREAKFAST DISCONTINUE ELIQUIS 30 tablet 5   No current facility-administered medications for this visit.    Allergies:   Patient has no known allergies.   Social History: Social History   Socioeconomic History  . Marital status: Widowed    Spouse name: Not on file  . Number of children: 3  . Years of education: Not on file  . Highest education level: Not on file  Occupational History  . Not on file  Tobacco Use  . Smoking status: Never Smoker  . Smokeless tobacco: Never Used  Vaping Use  . Vaping Use: Never used  Substance and Sexual Activity  .  Alcohol use: No  . Drug use: No  . Sexual activity: Not Currently    Partners: Male  Other Topics Concern  . 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  Arthritis Mother   . Heart disease Mother   . Heart disease Father 43       MI  . Coronary artery disease Other   . Diabetes Brother        DI    Review of Systems: All other systems reviewed and are otherwise negative except as noted above.   Physical Exam: VS:  BP 116/62   Pulse 89   Ht 5\' 4"  (1.626 m)   Wt 154 lb 12.8 oz (70.2 kg)   SpO2 98%   BMI 26.57 kg/m  , BMI Body mass index is 26.57 kg/m. Wt Readings from Last 3 Encounters:  11/05/20 154 lb 12.8 oz (70.2 kg)  10/22/20 156 lb (70.8 kg)  10/05/20 157 lb 6.4 oz (71.4 kg)    GEN- The patient is elderly appearing, alert and oriented x 3 today.   HEENT: normocephalic, atraumatic; sclera clear, conjunctiva pink; hearing intact; oropharynx clear; neck supple  Lungs- Clear to ausculation bilaterally, increased work of breathing.  No wheezes, rales, rhonchi Heart- Regular rate and rhythm GI- soft, non-tender, non-distended, bowel sounds present  Extremities- no clubbing, cyanosis, 2+ BLE edema  MS- no significant deformity or atrophy Skin- warm and dry, no rash or lesion  Psych- euthymic mood, full affect Neuro- strength and sensation are intact   EKG:  EKG is ordered today. EKG today shows AF, rate 88  Recent Labs: 01/30/2020: TSH 1.20 04/01/2020: Magnesium 2.3 04/16/2020: B Natriuretic Peptide 487.2 09/02/2020: ALT 11; Hemoglobin 12.8; Platelets 289.0 10/22/2020: BUN 18; Creatinine, Ser 1.62; NT-Pro BNP 1,896; Potassium 4.4; Sodium 138     Assessment and Plan:  1.  Persistent atrial fibrillation Rate controlled, previously had ERAF post DCCV Continue Xarelto for CHADS2VASC of 6  2.  Acute on chronic diastolic heart failure Improved with increased dose of Lasix EF normal by echo 2021 Repeat BMET today Continue Lasix 60mg  daily for now.  She still has about 10 pounds of fluid to go I wonder if there is an anxiety component to her shortness of breath. Her daughter reports that she feels there is.  Will defer to  PCP consideration of treatment If she cannot see PCP in 2 weeks, follow up with 2022 [facility].   Current medicines are reviewed at length with the patient today.   The patient does not have concerns regarding her medicines.  The following changes were made today:  none  Labs/ tests ordered today include: BMET Orders Placed This Encounter  Procedures  . Basic Metabolic Panel (BMET)  . Pro b natriuretic peptide (BNP)  . EKG 12-Lead     Disposition:   Follow up with Dr [place] as scheduled     Signed, [facility], NP 11/05/2020 12:44 PM   [facility] [address] [place] [phone] (office) [phone] (fax)

## 2020-11-06 ENCOUNTER — Telehealth: Payer: Self-pay

## 2020-11-06 LAB — BASIC METABOLIC PANEL
BUN/Creatinine Ratio: 13 (ref 12–28)
BUN: 20 mg/dL (ref 8–27)
CO2: 24 mmol/L (ref 20–29)
Calcium: 9.2 mg/dL (ref 8.7–10.3)
Chloride: 102 mmol/L (ref 96–106)
Creatinine, Ser: 1.6 mg/dL — ABNORMAL HIGH (ref 0.57–1.00)
GFR calc Af Amer: 33 mL/min/{1.73_m2} — ABNORMAL LOW (ref >59)
GFR calc non Af Amer: 29 mL/min/{1.73_m2} — ABNORMAL LOW (ref >59)
Glucose: 86 mg/dL (ref 65–99)
Potassium: 4.1 mmol/L (ref 3.5–5.2)
Sodium: 140 mmol/L (ref 134–144)

## 2020-11-06 LAB — PRO B NATRIURETIC PEPTIDE: NT-Pro BNP: 2078 pg/mL — ABNORMAL HIGH (ref 0–738)

## 2020-11-06 NOTE — Telephone Encounter (Signed)
Per DPR spoke with patient's daughter who is aware and agreeable to results Pt's daughter is aware and agreeable to keeping current medication regiment Reminded patients daughter of follow up appointment in April with Dr. Elberta Fortis, she is agreeable

## 2020-11-06 NOTE — Telephone Encounter (Signed)
-----   Message from Marily Lente, NP sent at 11/06/2020  4:58 AM EST ----- Please notify patient of stable labs. BNP shows persistent fluid. Kidney function and Potassium stable. Thanks!

## 2020-11-09 ENCOUNTER — Other Ambulatory Visit: Payer: Self-pay | Admitting: *Deleted

## 2020-11-09 NOTE — Patient Outreach (Signed)
[facility]) Care Management  11/09/2020  Jun Rightmyer 08-Aug-1932 [id number]  Unsuccessful outreach attempt made to patient's daughter [name]. RN Health Coach left HIPAA compliant voicemail message along with her contact information.  Plan: RN Health Coach will call patient within the month of February and will send an unsuccessful Letter.   Blanchie Serve RN, BSN [facility] Care Management  RN Health Coach [phone] [email or web address]

## 2020-11-10 ENCOUNTER — Encounter (HOSPITAL_COMMUNITY): Payer: Medicare Other

## 2020-11-10 ENCOUNTER — Encounter: Payer: Medicare Other | Admitting: Vascular Surgery

## 2020-11-17 ENCOUNTER — Encounter (HOSPITAL_COMMUNITY): Payer: Medicare Other

## 2020-11-17 ENCOUNTER — Encounter: Payer: Medicare Other | Admitting: Vascular Surgery

## 2020-11-25 ENCOUNTER — Other Ambulatory Visit: Payer: Self-pay

## 2020-11-26 ENCOUNTER — Ambulatory Visit (INDEPENDENT_AMBULATORY_CARE_PROVIDER_SITE_OTHER): Payer: Medicare Other | Admitting: Family Medicine

## 2020-11-26 ENCOUNTER — Encounter: Payer: Self-pay | Admitting: Family Medicine

## 2020-11-26 VITALS — BP 110/80 | HR 106 | Temp 97.7°F | Resp 20 | Ht 64.0 in

## 2020-11-26 DIAGNOSIS — E039 Hypothyroidism, unspecified: Secondary | ICD-10-CM | POA: Diagnosis not present

## 2020-11-26 DIAGNOSIS — Z0000 Encounter for general adult medical examination without abnormal findings: Secondary | ICD-10-CM

## 2020-11-26 DIAGNOSIS — F419 Anxiety disorder, unspecified: Secondary | ICD-10-CM

## 2020-11-26 DIAGNOSIS — Z23 Encounter for immunization: Secondary | ICD-10-CM

## 2020-11-26 DIAGNOSIS — E785 Hyperlipidemia, unspecified: Secondary | ICD-10-CM

## 2020-11-26 DIAGNOSIS — E538 Deficiency of other specified B group vitamins: Secondary | ICD-10-CM

## 2020-11-26 DIAGNOSIS — R739 Hyperglycemia, unspecified: Secondary | ICD-10-CM

## 2020-11-26 DIAGNOSIS — E1169 Type 2 diabetes mellitus with other specified complication: Secondary | ICD-10-CM

## 2020-11-26 DIAGNOSIS — D599 Acquired hemolytic anemia, unspecified: Secondary | ICD-10-CM

## 2020-11-26 DIAGNOSIS — I10 Essential (primary) hypertension: Secondary | ICD-10-CM

## 2020-11-26 DIAGNOSIS — I5033 Acute on chronic diastolic (congestive) heart failure: Secondary | ICD-10-CM

## 2020-11-26 DIAGNOSIS — M1990 Unspecified osteoarthritis, unspecified site: Secondary | ICD-10-CM

## 2020-11-26 LAB — BRAIN NATRIURETIC PEPTIDE: Pro B Natriuretic peptide (BNP): 411 pg/mL — ABNORMAL HIGH (ref 0.0–100.0)

## 2020-11-26 LAB — HEMOGLOBIN A1C: Hgb A1c MFr Bld: 6.1 % (ref 4.6–6.5)

## 2020-11-26 MED ORDER — SERTRALINE HCL 25 MG PO TABS: 25.0000 mg | ORAL_TABLET | Freq: Every day | ORAL | 2 refills | Status: DC

## 2020-11-26 MED ORDER — POTASSIUM CHLORIDE CRYS ER 20 MEQ PO TBCR: 40.0000 meq | EXTENDED_RELEASE_TABLET | Freq: Every day | ORAL | 1 refills | Status: DC

## 2020-11-26 MED ORDER — DICLOFENAC SODIUM 1 % EX GEL: 2.0000 g | Freq: Every day | CUTANEOUS | 3 refills | Status: DC | PRN

## 2020-11-26 MED ORDER — LEVOTHYROXINE SODIUM 75 MCG PO TABS: 75.0000 ug | ORAL_TABLET | Freq: Every day | ORAL | 0 refills | Status: DC

## 2020-11-26 MED ORDER — NONFORMULARY OR COMPOUNDED ITEM: 0 refills | Status: AC

## 2020-11-26 NOTE — Assessment & Plan Note (Signed)
Well controlled, no changes to meds. Encouraged heart healthy diet such as the DASH diet and exercise as tolerated.  °

## 2020-11-26 NOTE — Progress Notes (Signed)
Subjective:     Belinda Anderson is a 87 y.o. female and is here for a comprehensive physical exam. The patient reports problems - anxiety is bad --- her daughter is with her and cards asked her to talk to [place] about it .    Her daughter is with her and states her sob is actually better today with inc lasix   Social History   Socioeconomic History  . Marital status: Widowed    Spouse name: Not on file  . Number of children: 3  . Years of education: Not on file  . Highest education level: Not on file  Occupational History  . Not on file  Tobacco Use  . Smoking status: Never Smoker  . Smokeless tobacco: Never Used  Vaping Use  . Vaping Use: Never used  Substance and Sexual Activity  . Alcohol use: No  . Drug use: No  . Sexual activity: Not Currently    Partners: Male  Other Topics Concern  . 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 FOOT EXAM  Never done  . OPHTHALMOLOGY EXAM  Never done  . URINE MICROALBUMIN  Never done  . TETANUS/TDAP  Never done  . DEXA SCAN  Never done  . MAMMOGRAM  03/28/2013  . HEMOGLOBIN A1C  03/31/2020  . INFLUENZA VACCINE  05/17/2020  . COVID-19 Vaccine  Completed  . PNA vac Low Risk Adult  Completed    The following portions of the patient's history were reviewed and updated as appropriate:  She  has a past medical history of Arthritis, Atrial fibrillation (HCC), Hypertension, Thyroid disease, and TIA (transient ischemic attack). She does not have any pertinent problems on file. She  has a past surgical history that includes Eye surgery; Radiology with anesthesia (N/A,  09/30/2019); IR PERCUTANEOUS ART THROMBECTOMY/INFUSION INTRACRANIAL INC DIAG ANGIO (09/30/2019); IR CT Head Ltd (09/30/2019); IR ANGIO VERTEBRAL SEL SUBCLAVIAN INNOMINATE UNI R MOD SED (09/30/2019); TEE without cardioversion (N/A, 04/15/2020); and Cardioversion (N/A, 04/15/2020). Her family history includes Arthritis in her mother; Coronary artery disease in an other family member; Diabetes in her brother; Heart disease in her mother; Heart disease (age of onset: 66) in her father. She  reports that she has never smoked. She has never used smokeless tobacco. She reports that she does not drink alcohol and does not use drugs. She has a current medication list which includes the following prescription(s): acetaminophen, diclofenac sodium, diltiazem, furosemide, cvs dry eye relief, levothyroxine, potassium chloride sa, metamucil, and xarelto. Current Outpatient Medications on File Prior to Visit  Medication Sig Dispense Refill  . acetaminophen (TYLENOL) 650 MG CR tablet Take 1,300 mg by mouth daily as needed for pain (arthritis).     Marland Kitchen diclofenac Sodium (VOLTAREN) 1 % GEL Apply 2 g topically daily as needed (pain).     Marland Kitchen diltiazem (CARDIZEM CD) 180 MG 24 hr capsule TAKE 1 CAPSULE(180 MG) BY MOUTH DAILY 90 capsule 2  . furosemide (LASIX) 20 MG tablet Take 2 tablets (40mg ) by mouth daily, may take and additional 20 mg as needed for swelling and edema (Patient  taking differently: Take 60 mg by mouth daily.) 270 tablet 3  . Glycerin-Hypromellose-PEG 400 ([facility]S DRY EYE RELIEF) 0.2-0.2-1 % SOLN Place 1 drop into both eyes daily as needed (Dry eye).    Marland Kitchen levothyroxine (SYNTHROID) 75 MCG tablet Take 1 tablet (75 mcg total) by mouth daily before breakfast. 90 tablet 0  . potassium chloride SA (KLOR-CON) 20 MEQ tablet Take 2 tablets (40 mEq total) by mouth daily. 90 tablet 1  . psyllium (METAMUCIL) 0.52 g capsule Take 0.52 g by mouth daily.    Carlena Hurl 15 MG TABS tablet TAKE 1 TABLET(15 MG) BY MOUTH DAILY WITH  BREAKFAST DISCONTINUE ELIQUIS 30 tablet 5   No current facility-administered medications on file prior to visit.   She has No Known Allergies..  Review of Systems  Review of Systems  Constitutional: Negative for activity change, appetite change and fatigue.  HENT: Negative for hearing loss, congestion, tinnitus and ear discharge.   Eyes: Negative for visual disturbance (see optho q1y -- vision corrected to 20/20 with glasses).  Respiratory: Negative for cough, chest tightness and shortness of breath.   Cardiovascular: Negative for chest pain, palpitations and leg swelling.  Gastrointestinal: Negative for abdominal pain, diarrhea, constipation and abdominal distention.  Genitourinary: Negative for urgency, frequency, decreased urine volume and difficulty urinating.  Musculoskeletal: Negative for back pain, arthralgias and gait problem.  Skin: Negative for color change, pallor and rash.  Neurological: Negative for dizziness, light-headedness, numbness and headaches.  Hematological: Negative for adenopathy. Does not bruise/bleed easily.  Psychiatric/Behavioral: Negative for suicidal ideas, confusion, sleep disturbance, self-injury, dysphoric mood, decreased concentration and agitation.  Pt is able to read and write and can do all ADLs No risk for falling No abuse/ violence in home    Objective:    BP 110/80 (BP Location: Right Arm, Patient Position: Sitting, Cuff Size: Normal)   Pulse (!) 106   Temp 97.7 F (36.5 C) (Oral)   Resp 20   Ht 5\' 4"  (1.626 m)   SpO2 99%   BMI 26.57 kg/m  General appearance: alert, cooperative, appears stated age and no distress Head: Normocephalic, without obvious abnormality, atraumatic Eyes: negative findings: lids and lashes normal, conjunctivae and sclerae normal and pupils equal, round, reactive to light and accomodation Ears: cerumen impaction b/l  Neck: no adenopathy, no carotid bruit, no JVD, supple, symmetrical, trachea midline and thyroid  not enlarged, symmetric, no tenderness/mass/nodules Back: negative Lungs: clear to auscultation bilaterally -- pt sob  Heart: S1, S2 normal Abdomen: soft, non-tender; bowel sounds normal; no masses,  no organomegaly Pelvic: not indicated; post-menopausal, no abnormal Pap smears in past Extremities: extremities normal, atraumatic, no cyanosis or edema Pulses: 2+ and symmetric Skin: Skin color, texture, turgor normal. No rashes or lesions Lymph nodes: Cervical, supraclavicular, and axillary nodes normal. Neurologic:  pt in transport chair ,  Due to decreased stamina / DOE    Assessment:    Healthy female exam.      Plan:    ghm utd Check labs  See After Visit Summary for Counseling Recommendations    1. Hypothyroidism, unspecified type Check labs con't meds  - levothyroxine (SYNTHROID) 75 MCG tablet; Take 1 tablet (75 mcg total) by mouth daily before breakfast.  Dispense: 90 tablet; Refill: 0 - AMB Referral to Community Care Coordinaton - NONFORMULARY OR COMPOUNDED ITEM; rollator walker  #1  As directed -------  Dx  CHF,  DOE,  OA  Dispense: 1 each; Refill: 0  2. Acquired hemolytic anemia, unspecified (HCC) Check  labs  - AMB Referral to [facility] Coordinaton - NONFORMULARY OR COMPOUNDED ITEM; rollator walker  #1  As directed -------  Dx  CHF,  DOE,  OA  Dispense: 1 each; Refill: 0  3. Hyperlipidemia associated with type 2 diabetes mellitus (HCC) Encouraged heart healthy diet, increase exercise, avoid trans fats, consider a krill oil cap daily - AMB Referral to Community Care Coordinaton - NONFORMULARY OR COMPOUNDED ITEM; rollator walker  #1  As directed -------  Dx  CHF,  DOE,  OA  Dispense: 1 each; Refill: 0  4. B12 deficiency Check labs  - AMB Referral to Community Care Coordinaton - NONFORMULARY OR COMPOUNDED ITEM; rollator walker  #1  As directed -------  Dx  CHF,  DOE,  OA  Dispense: 1 each; Refill: 0  5. Essential hypertension Well controlled, no changes to meds.  Encouraged heart healthy diet such as the DASH diet and exercise as tolerated.  - potassium chloride SA (KLOR-CON) 20 MEQ tablet; Take 2 tablets (40 mEq total) by mouth daily.  Dispense: 90 tablet; Refill: 1 - AMB Referral to Community Care Coordinaton - NONFORMULARY OR COMPOUNDED ITEM; rollator walker  #1  As directed -------  Dx  CHF,  DOE,  OA  Dispense: 1 each; Refill: 0  6. Hyperglycemia Check labs  - AMB Referral to Community Care Coordinaton - Hemoglobin A1c - NONFORMULARY OR COMPOUNDED ITEM; rollator walker  #1  As directed -------  Dx  CHF,  DOE,  OA  Dispense: 1 each; Refill: 0  7. Arthritis Stable  - diclofenac Sodium (VOLTAREN) 1 % GEL; Apply 2 g topically daily as needed (pain).  Dispense: 150 g; Refill: 3 - AMB Referral to Community Care Coordinaton - NONFORMULARY OR COMPOUNDED ITEM; rollator walker  #1  As directed -------  Dx  CHF,  DOE,  OA  Dispense: 1 each; Refill: 0  8. Anxiety Start zoloft and f/u 1 month - sertraline (ZOLOFT) 25 MG tablet; Take 1 tablet (25 mg total) by mouth daily.  Dispense: 30 tablet; Refill: 2 - NONFORMULARY OR COMPOUNDED ITEM; rollator walker  #1  As directed -------  Dx  CHF,  DOE,  OA  Dispense: 1 each; Refill: 0  9. Acute on chronic diastolic CHF (congestive heart failure) (HCC) On lasix --- check labs today If sob worsens -- go to ER  - NONFORMULARY OR COMPOUNDED ITEM; rollator walker  #1  As directed -------  Dx  CHF,  DOE,  OA  Dispense: 1 each; Refill: 0 - Brain natriuretic peptide; Future - Brain natriuretic peptide  10. Need for influenza vaccination   - Flu Vaccine QUAD High Dose(Fluad)  11. Preventative health care See above

## 2020-11-26 NOTE — Patient Instructions (Signed)
Preventive Care 61 Years and Older, Female Preventive care refers to lifestyle choices and visits with your health care provider that can promote health and wellness. This includes:  A yearly physical exam. This is also called an annual wellness visit.  Regular dental and eye exams.  Immunizations.  Screening for certain conditions.  Healthy lifestyle choices, such as: ? Eating a healthy diet. ? Getting regular exercise. ? Not using drugs or products that contain nicotine and tobacco. ? Limiting alcohol use. What can I expect for my preventive care visit? Physical exam Your health care provider will check your:  Height and weight. These may be used to calculate your BMI (body mass index). BMI is a measurement that tells if you are at a healthy weight.  Heart rate and blood pressure.  Body temperature.  Skin for abnormal spots. Counseling Your health care provider may ask you questions about your:  Past medical problems.  Family's medical history.  Alcohol, tobacco, and drug use.  Emotional well-being.  Home life and relationship well-being.  Sexual activity.  Diet, exercise, and sleep habits.  History of falls.  Memory and ability to understand (cognition).  Work and work Statistician.  Pregnancy and menstrual history.  Access to firearms. What immunizations do I need? Vaccines are usually given at various ages, according to a schedule. Your health care provider will recommend vaccines for you based on your age, medical history, and lifestyle or other factors, such as travel or where you work.   What tests do I need? Blood tests  Lipid and cholesterol levels. These may be checked every 5 years, or more often depending on your overall health.  Hepatitis C test.  Hepatitis B test. Screening  Lung cancer screening. You may have this screening every year starting at age 74 if you have a 30-pack-year history of smoking and currently smoke or have quit within  the past 15 years.  Colorectal cancer screening. ? All adults should have this screening starting at age 44 and continuing until age 58. ? Your health care provider may recommend screening at age 2 if you are at increased risk. ? You will have tests every 1-10 years, depending on your results and the type of screening test.  Diabetes screening. ? This is done by checking your blood sugar (glucose) after you have not eaten for a while (fasting). ? You may have this done every 1-3 years.  Mammogram. ? This may be done every 1-2 years. ? Talk with your health care provider about how often you should have regular mammograms.  Abdominal aortic aneurysm (AAA) screening. You may need this if you are a current or former smoker.  BRCA-related cancer screening. This may be done if you have a family history of breast, ovarian, tubal, or peritoneal cancers. Other tests  STD (sexually transmitted disease) testing, if you are at risk.  Bone density scan. This is done to screen for osteoporosis. You may have this done starting at age 104. Talk with your health care provider about your test results, treatment options, and if necessary, the need for more tests. Follow these instructions at home: Eating and drinking  Eat a diet that includes fresh fruits and vegetables, whole grains, lean protein, and low-fat dairy products. Limit your intake of foods with high amounts of sugar, saturated fats, and salt.  Take vitamin and mineral supplements as recommended by your health care provider.  Do not drink alcohol if your health care provider tells you not to drink.  If you drink alcohol: ? Limit how much you have to 0-1 drink a day. ? Be aware of how much alcohol is in your drink. In the [place] one drink equals one 12 oz bottle of beer (355 mL), one 5 oz glass of wine (148 mL), or one 1 oz glass of hard liquor (44 mL).   Lifestyle  Take daily care of your teeth and gums. Brush your teeth every morning  and night with fluoride toothpaste. Floss one time each day.  Stay active. Exercise for at least 30 minutes 5 or more days each week.  Do not use any products that contain nicotine or tobacco, such as cigarettes, e-cigarettes, and chewing tobacco. If you need help quitting, ask your health care provider.  Do not use drugs.  If you are sexually active, practice safe sex. Use a condom or other form of protection in order to prevent STIs (sexually transmitted infections).  Talk with your health care provider about taking a low-dose aspirin or statin.  Find healthy ways to cope with stress, such as: ? Meditation, yoga, or listening to music. ? Journaling. ? Talking to a trusted person. ? Spending time with friends and family. Safety  Always wear your seat belt while driving or riding in a vehicle.  Do not drive: ? If you have been drinking alcohol. Do not ride with someone who has been drinking. ? When you are tired or distracted. ? While texting.  Wear a helmet and other protective equipment during sports activities.  If you have firearms in your house, make sure you follow all gun safety procedures. What's next?  Visit your health care provider once a year for an annual wellness visit.  Ask your health care provider how often you should have your eyes and teeth checked.  Stay up to date on all vaccines. This information is not intended to replace advice given to you by your health care provider. Make sure you discuss any questions you have with your health care provider. Document Revised: 09/23/2020 Document Reviewed: 09/27/2018 Elsevier Patient Education  2021 Elsevier Inc.  

## 2020-11-26 NOTE — Assessment & Plan Note (Signed)
Check labs 

## 2020-11-26 NOTE — Assessment & Plan Note (Signed)
Check labs today.

## 2020-11-27 ENCOUNTER — Telehealth: Payer: Self-pay | Admitting: *Deleted

## 2020-11-27 NOTE — Telephone Encounter (Signed)
Prior auth submitted thru cover my meds (Key: [id number]).  Prior auth approved: Effective from 11/27/2020 through 11/27/2021.  Fax sent to pharmacy

## 2020-12-01 ENCOUNTER — Other Ambulatory Visit (HOSPITAL_COMMUNITY): Payer: Self-pay | Admitting: Interventional Radiology

## 2020-12-01 DIAGNOSIS — I6601 Occlusion and stenosis of right middle cerebral artery: Secondary | ICD-10-CM

## 2020-12-10 ENCOUNTER — Other Ambulatory Visit: Payer: Self-pay | Admitting: *Deleted

## 2020-12-10 NOTE — Patient Outreach (Signed)
[facility]) Care Management  12/10/2020  Belinda Anderson 02-19-1932 [id number]  Multiple attempts to establish contact with patient without success. No response from unsuccessful letter mailed to patient. Case is being closed at this time.   Plan: RN Health Coach will close case.  Blanchie Serve RN, BSN [facility] Care Management  RN Health Coach [phone] [email or web address]

## 2020-12-15 ENCOUNTER — Ambulatory Visit (HOSPITAL_COMMUNITY): Admission: RE | Admit: 2020-12-15 | Payer: Medicare Other | Source: Ambulatory Visit

## 2020-12-15 ENCOUNTER — Encounter (HOSPITAL_COMMUNITY): Payer: Self-pay

## 2020-12-17 ENCOUNTER — Telehealth: Payer: Self-pay

## 2020-12-17 NOTE — Chronic Care Management (AMB) (Signed)
  Chronic Care Management   Outreach Note  12/17/2020 Name: Alizza Sacra MRN: [id number] DOB: Mar 03, 1932  Kiasia Chou is a 87 y.o. year old female who is a primary care patient of [name], DO. I reached out to Blase Mess by phone today in response to a referral sent by Ms. Adah Perl PCP, [name], DO    An unsuccessful telephone outreach was attempted today. The patient was referred to the case management team for assistance with care management and care coordination.   Follow Up Plan: A HIPAA compliant phone message was left for the patient providing contact information and requesting a return call.  The care management team will reach out to the patient again over the next 5 days.  If patient returns call to provider office, please advise to call Embedded Care Management Care Guide Penne Lash at [phone]  Penne Lash, RMA Care Guide, Embedded Care Coordination [facility]  [place], [place] [address] Direct Dial: [phone] [email or web address]: [email or web address]

## 2020-12-21 NOTE — Chronic Care Management (AMB) (Signed)
  Chronic Care Management   Outreach Note  12/21/2020 Name: Belinda Anderson MRN: [id number] DOB: [birth year]  Belinda Anderson is a 87 y.o. year old female who is a primary care patient of [name], DO. I reached out to Belinda Anderson by phone today in response to a referral sent by Belinda Anderson PCP, Belinda Anderson, Grayling Congress, DO     A second unsuccessful telephone outreach was attempted today. The patient was referred to the case management team for assistance with care management and care coordination.   Follow Up Plan: A HIPAA compliant phone message was left for the patient providing contact information and requesting a return call.  The care management team will reach out to the patient again over the next 5 days.  If patient returns call to provider office, please advise to call Embedded Care Management Care Guide Penne Lash  at [phone]  Penne Lash, RMA Care Guide, Embedded Care Coordination [facility]  [place], [place] [address] Direct Dial: [phone] [email or web address]: [email or web address]

## 2020-12-24 ENCOUNTER — Ambulatory Visit: Payer: Medicare Other | Admitting: Family Medicine

## 2020-12-24 ENCOUNTER — Encounter: Payer: Self-pay | Admitting: Family Medicine

## 2020-12-24 ENCOUNTER — Other Ambulatory Visit: Payer: Self-pay

## 2020-12-24 VITALS — BP 130/70 | HR 79 | Temp 98.1°F | Resp 20 | Ht 64.0 in

## 2020-12-24 DIAGNOSIS — H6121 Impacted cerumen, right ear: Secondary | ICD-10-CM | POA: Diagnosis not present

## 2020-12-24 DIAGNOSIS — F419 Anxiety disorder, unspecified: Secondary | ICD-10-CM | POA: Diagnosis not present

## 2020-12-24 MED ORDER — SERTRALINE HCL 25 MG PO TABS: 25.0000 mg | ORAL_TABLET | Freq: Every day | ORAL | 3 refills | Status: DC

## 2020-12-24 NOTE — Assessment & Plan Note (Signed)
Improved with zoloft  Refill sent

## 2020-12-24 NOTE — Assessment & Plan Note (Signed)
Irrigated successfully  rto prn 

## 2020-12-24 NOTE — Progress Notes (Signed)
Patient ID: Belinda Anderson, female    DOB: Jul 20, 1932  Age: 87 y.o. MRN: [id number]    Subjective:  Subjective  HPI [name] presents for f/u depression / anxiety -- she is much better per her daughter  She also needs her R ear irrigated ---- they were unable to put debrox in her ear because she did not like it   Review of Systems  Constitutional: Negative for appetite change, diaphoresis, fatigue and unexpected weight change.  HENT: Negative for ear pain and hearing loss.   Eyes: Negative for pain, redness and visual disturbance.  Respiratory: Negative for cough, chest tightness, shortness of breath and wheezing.   Cardiovascular: Negative for chest pain, palpitations and leg swelling.  Endocrine: Negative for cold intolerance, heat intolerance, polydipsia, polyphagia and polyuria.  Genitourinary: Negative for difficulty urinating, dysuria and frequency.  Neurological: Negative for dizziness, light-headedness, numbness and headaches.    History Past Medical History:  Diagnosis Date  . Arthritis   . Atrial fibrillation (HCC)   . Hypertension   . Thyroid disease   . TIA (transient ischemic attack)     She has a past surgical history that includes Eye surgery; Radiology with anesthesia (N/A, 09/30/2019); IR PERCUTANEOUS ART THROMBECTOMY/INFUSION INTRACRANIAL INC DIAG ANGIO (09/30/2019); IR CT Head Ltd (09/30/2019); IR ANGIO VERTEBRAL SEL SUBCLAVIAN INNOMINATE UNI R MOD SED (09/30/2019); TEE without cardioversion (N/A, 04/15/2020); and Cardioversion (N/A, 04/15/2020).   Her family history includes Arthritis in her mother; Coronary artery disease in an other family member; Diabetes in her brother; Heart disease in her mother; Heart disease (age of onset: 50) in her father.She reports that she has never smoked. She has never used smokeless tobacco. She reports that she does not drink alcohol and does not use drugs.  Current Outpatient Medications on File Prior to Visit  Medication  Sig Dispense Refill  . acetaminophen (TYLENOL) 650 MG CR tablet Take 1,300 mg by mouth daily as needed for pain (arthritis).     Marland Kitchen diclofenac Sodium (VOLTAREN) 1 % GEL Apply 2 g topically daily as needed (pain). 150 g 3  . diltiazem (CARDIZEM CD) 180 MG 24 hr capsule TAKE 1 CAPSULE(180 MG) BY MOUTH DAILY 90 capsule 2  . furosemide (LASIX) 20 MG tablet Take 2 tablets (40mg ) by mouth daily, may take and additional 20 mg as needed for swelling and edema (Patient taking differently: Take 60 mg by mouth daily.) 270 tablet 3  . Glycerin-Hypromellose-PEG 400 ([facility] DRY EYE RELIEF) 0.2-0.2-1 % SOLN Place 1 drop into both eyes daily as needed (Dry eye).    levothyroxine (SYNTHROID) 75 MCG tablet Take 1 tablet (75 mcg total) by mouth daily before breakfast. 90 tablet 0  . NONFORMULARY OR COMPOUNDED ITEM rollator walker  #1  As directed -------  Dx  CHF,  DOE,  OA 1 each 0  . potassium chloride SA (KLOR-CON) 20 MEQ tablet Take 2 tablets (40 mEq total) by mouth daily. 90 tablet 1  . psyllium (METAMUCIL) 0.52 g capsule Take 0.52 g by mouth daily.    Marland Kitchen 15 MG TABS tablet TAKE 1 TABLET(15 MG) BY MOUTH DAILY WITH BREAKFAST DISCONTINUE ELIQUIS 30 tablet 5   No current facility-administered medications on file prior to visit.     Objective:  Objective  Physical Exam Vitals and nursing note reviewed.  Constitutional:      Appearance: She is well-developed.  HENT:     Head: Normocephalic and atraumatic.     Right Ear: Tympanic membrane normal. There  is impacted cerumen.     Left Ear: Tympanic membrane normal. There is no impacted cerumen.     Ears:      Nose: No congestion.  Eyes:     Conjunctiva/sclera: Conjunctivae normal.  Neck:     Thyroid: No thyromegaly.     Vascular: No carotid bruit or JVD.  Cardiovascular:     Rate and Rhythm: Normal rate and regular rhythm.     Heart sounds: Normal heart sounds. No murmur heard.   Pulmonary:     Effort: Pulmonary effort is normal. No respiratory  distress.     Breath sounds: Normal breath sounds. No wheezing or rales.  Chest:     Chest wall: No tenderness.  Musculoskeletal:     Cervical back: Normal range of motion and neck supple.  Neurological:     Mental Status: She is alert and oriented to person, place, and time.    BP 130/70 (BP Location: Right Arm, Patient Position: Sitting, Cuff Size: Normal)   Pulse 79   Temp 98.1 F (36.7 C) (Oral)   Resp 20   Ht 5\' 4"  (1.626 m)   SpO2 100%   BMI 26.57 kg/m  Wt Readings from Last 3 Encounters:  11/05/20 154 lb 12.8 oz (70.2 kg)  10/22/20 156 lb (70.8 kg)  10/05/20 157 lb 6.4 oz (71.4 kg)     Lab Results  Component Value Date   WBC 5.4 09/02/2020   HGB 12.8 09/02/2020   HCT 38.5 09/02/2020   PLT 289.0 09/02/2020   GLUCOSE 86 11/05/2020   CHOL 117 01/30/2020   TRIG 75.0 01/30/2020   HDL 33.80 (L) 01/30/2020   LDLDIRECT 97.0 04/06/2018   LDLCALC 68 01/30/2020   ALT 11 09/02/2020   AST 17 09/02/2020   NA 140 11/05/2020   K 4.1 11/05/2020   CL 102 11/05/2020   CREATININE 1.60 (H) 11/05/2020   BUN 20 11/05/2020   CO2 24 11/05/2020   TSH 1.20 01/30/2020   INR 2.0 (H) 09/02/2020   HGBA1C 6.1 11/26/2020    DG Chest 2 View  Result Date: 10/22/2020 CLINICAL DATA:  Shortness of breath. EXAM: CHEST - 2 VIEW COMPARISON:  04/16/2020 FINDINGS: Midline trachea. Mild cardiomegaly. Atherosclerosis in the transverse aorta. No pleural effusion or pneumothorax. Minimal right greater than left apical pleuroparenchymal scarring. No congestive failure. IMPRESSION: No acute cardiopulmonary disease. Cardiomegaly without congestive failure. Aortic Atherosclerosis (ICD10-I70.0). Electronically Signed   By: 06/17/2020 M.D.   On: 10/22/2020 13:08     Assessment & Plan:  Plan  I am having 12/20/2020 "Blase Mess" maintain her acetaminophen, Metamucil, CVS Dry Eye Relief, furosemide, Xarelto, diltiazem, diclofenac Sodium, levothyroxine, potassium chloride SA, NONFORMULARY OR COMPOUNDED  ITEM, and sertraline.  Meds ordered this encounter  Medications  . sertraline (ZOLOFT) 25 MG tablet    Sig: Take 1 tablet (25 mg total) by mouth daily.    Dispense:  90 tablet    Refill:  3    Problem List Items Addressed This Visit      Unprioritized   Anxiety - Primary    Improved with zoloft  Refill sent       Relevant Medications   sertraline (ZOLOFT) 25 MG tablet   Impacted cerumen of right ear    Irrigated successfully  rto prn          Follow-up: No follow-ups on file.  Almyra Free, DO

## 2020-12-24 NOTE — Patient Instructions (Signed)

## 2021-01-05 NOTE — Chronic Care Management (AMB) (Signed)
  Chronic Care Management   Note  01/05/2021 Name: Belinda Anderson MRN: [id number] DOB: July 10, 1932  Belinda Anderson is a 87 y.o. year old female who is a primary care patient of [name], DO. I reached out to [name] by phone today in response to a referral sent by Ms. [name] PCP, [name], [name], DO     [name] was given information about Chronic Care Management services today including:  1. CCM service includes personalized support from designated clinical staff supervised by her physician, including individualized plan of care and coordination with other care providers 2. 24/7 contact phone numbers for assistance for urgent and routine care needs. 3. Service will only be billed when office clinical staff spend 20 minutes or more in a month to coordinate care. 4. Only one practitioner may furnish and bill the service in a calendar month. 5. The patient may stop CCM services at any time (effective at the end of the month) by phone call to the office staff. 6. The patient will be responsible for cost sharing (co-pay) of up to 20% of the service fee (after annual deductible is met).  Patient agreed to services and verbal consent obtained.   Follow up plan: Telephone appointment with care management team member scheduled for:01/07/2021  Noreene Larsson, [place], [place], [place] [address] Direct Dial: [phone] [email or web address] Website: [email or web address]

## 2021-01-07 ENCOUNTER — Ambulatory Visit (INDEPENDENT_AMBULATORY_CARE_PROVIDER_SITE_OTHER): Payer: Medicare Other

## 2021-01-07 ENCOUNTER — Other Ambulatory Visit: Payer: Self-pay

## 2021-01-07 DIAGNOSIS — I4891 Unspecified atrial fibrillation: Secondary | ICD-10-CM

## 2021-01-07 DIAGNOSIS — F419 Anxiety disorder, unspecified: Secondary | ICD-10-CM

## 2021-01-07 DIAGNOSIS — I5033 Acute on chronic diastolic (congestive) heart failure: Secondary | ICD-10-CM | POA: Diagnosis not present

## 2021-01-07 DIAGNOSIS — I10 Essential (primary) hypertension: Secondary | ICD-10-CM

## 2021-01-07 DIAGNOSIS — R413 Other amnesia: Secondary | ICD-10-CM

## 2021-01-07 NOTE — Chronic Care Management (AMB) (Signed)
Chronic Care Management   CCM RN Visit Note  01/08/2021 Name: Belinda Anderson MRN: [id number] DOB: [birth year]  Subjective: Belinda Anderson is a 87 y.o. year old female who is a primary care patient of [name], DO. The care management team was consulted for assistance with disease management and care coordination needs.    Engaged with patient by telephone for initial visit in response to provider referral for case management and/or care coordination services.   Consent to Services:  The patient was given the following information about Chronic Care Management services today, agreed to services, and gave verbal consent: 1. CCM service includes personalized support from designated clinical staff supervised by the primary care provider, including individualized plan of care and coordination with other care providers 2. 24/7 contact phone numbers for assistance for urgent and routine care needs. 3. Service will only be billed when office clinical staff spend 20 minutes or more in a month to coordinate care. 4. Only one practitioner may furnish and bill the service in a calendar month. 5.The patient may stop CCM services at any time (effective at the end of the month) by phone call to the office staff. 6. The patient will be responsible for cost sharing (co-pay) of up to 20% of the service fee (after annual deductible is met). Patient agreed to services and consent obtained.  Patient agreed to services and verbal consent obtained.   Assessment: Review of patient past medical history, allergies, medications, health status, including review of consultants reports, laboratory and other test data, was performed as part of comprehensive evaluation and provision of chronic care management services.   SDOH (Social Determinants of Health) assessments and interventions performed:  SDOH Interventions   Flowsheet Row Most Recent Value  SDOH Interventions   Food Insecurity Interventions  Intervention Not Indicated  Transportation Interventions Intervention Not Indicated       CCM Care Plan  No Known Allergies  Outpatient Encounter Medications as of 01/07/2021  Medication Sig  . acetaminophen (TYLENOL) 650 MG CR tablet Take 1,300 mg by mouth daily as needed for pain (arthritis).   Marland Kitchen diclofenac Sodium (VOLTAREN) 1 % GEL Apply 2 g topically daily as needed (pain).  Marland Kitchen diltiazem (CARDIZEM CD) 180 MG 24 hr capsule TAKE 1 CAPSULE(180 MG) BY MOUTH DAILY  . furosemide (LASIX) 20 MG tablet Take 2 tablets (68m) by mouth daily, may take and additional 20 mg as needed for swelling and edema (Patient taking differently: Take 60 mg by mouth daily.)  . Glycerin-Hypromellose-PEG 400 ([facility] DRY EYE RELIEF) 0.2-0.2-1 % SOLN Place 1 drop into both eyes daily as needed (Dry eye).  .Marland Kitchenlevothyroxine (SYNTHROID) 75 MCG tablet Take 1 tablet (75 mcg total) by mouth daily before breakfast.  . potassium chloride SA (KLOR-CON) 20 MEQ tablet Take 2 tablets (40 mEq total) by mouth daily.  . psyllium (METAMUCIL) 0.52 g capsule Take 0.52 g by mouth daily.  . sertraline (ZOLOFT) 25 MG tablet Take 1 tablet (25 mg total) by mouth daily.  .Alveda Reasons15 MG TABS tablet TAKE 1 TABLET(15 MG) BY MOUTH DAILY WITH BREAKFAST DISCONTINUE ELIQUIS  . NONFORMULARY OR COMPOUNDED ITEM rollator walker  #1  As directed -------  Dx  CHF,  DOE,  OA   No facility-administered encounter medications on file as of 01/07/2021.    Patient Active Problem List   Diagnosis Date Noted  . Anxiety 12/24/2020  . Impacted cerumen of right ear 12/24/2020  . Acute on chronic diastolic CHF (congestive heart  failure) ([place]) 11/26/2020  . Lower extremity edema 01/30/2020  . COVID-19 virus infection 11/21/2019  . Cough 11/21/2019  . Hyperlipidemia associated with type 2 diabetes mellitus ([place]) 10/12/2019  . External hemorrhoid, bleeding 10/12/2019  . Atrial fibrillation ([place]) 10/02/2019  . Stroke due to embolism of right middle cerebral  artery ([place]) s/p IR R MCA M2 09/30/2019  . Middle cerebral artery embolism, right 09/30/2019  . Contusion of foot including toes, right, initial encounter 08/10/2018  . Primary osteoarthritis of right knee 07/31/2018  . Right leg swelling 07/17/2018  . Pain of right calf 07/17/2018  . Preventative health care 09/18/2017  . Low back pain radiating to left leg 11/28/2016  . Hyperlipidemia 02/02/2015  . Sciatica 07/16/2014  . Knee pain, acute 07/16/2014  . Lower extremity pain, right 07/16/2014  . Skin infection 07/16/2014  . Incontinence of urine in female 06/19/2013  . Cerebral aneurysm rupture ([place]) 04/08/2013  . LEG EDEMA, RIGHT 07/06/2010  . OTHER DRUG ALLERGY 07/06/2010  . DIARRHEA 02/12/2010  . HEMATURIA, HX OF 11/24/2009  . ANEURYSM, HX OF 08/03/2009  . B12 deficiency 07/24/2009  . 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 SYNCOPE 07/20/2009  . DIZZINESS 07/20/2009  . Memory loss 07/20/2009  . 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 Hypothyroidism 12/14/2007  . Essential hypertension 12/14/2007  . DIVERTICULITIS, HX OF 12/14/2007  . GOITER 01/30/2007  . HYPERTENSION, BENIGN 01/30/2007  . UTERINE POLYP 01/30/2007  . THYROIDECTOMY, HX OF 01/30/2007    Conditions to be addressed/monitored:Atrial Fibrillation, CHF, HTN and Anxiety   CLINICAL CARE PLAN: Patient Care Plan: Cardiovascular Disease    Problem Identified: Symptom managment of cardivascular disease   Priority: Medium    Long-Range Goal: Symptom Exacerbation Prevented or Minimized   Start Date: 01/07/2021  Expected End Date: 04/08/2021  This Visit's Progress: On track  Current Barriers:  Marland Kitchen Knowledge deficit related to long term care plan management of cardiovascular disease. Spoke with patient's daughter, Williemae Natter party release). Per Ms. Vinnefors, patient has dementia. Memory loss noted on chart. Ms. Priscille Heidelberg reports client cardiac history of a-fib and heart failure, HTN.  She also reports patient has had a stroke without deficits in the past. Ms. Priscille Heidelberg is primary caregiver and assist with managing clients health care needs.  Patient does not keep up with daily weights. Family monitors for other signs of exacerbation such at shortness of breath and edema. Daughter states patient weighs at provider visits. Weight 154 pounds at provider visit on 11/05/20. Equipment used: Cane, blood pressure cuff and pulse oximeter as needed. Transportation provided by Ms. Vinnefors.   Case Manager Clinical Goal(s):  Marland Kitchen Patient/caregiver will verbalize understanding of management of cardiovascular disease and when to call doctor . Patient will take medications as prescribed. Interventions:  . Collaboration with [place], DO regarding development and update of comprehensive plan of care as evidenced by provider attestation and co-signature . Inter-disciplinary care team collaboration (see longitudinal plan of care) . Reviewed Heart Failure Action Plan and provided written copy . Discussed importance of monitoring weights in the management of CHF, reviewed signs/symptoms of exacerbation and importance of notifying provider as soon as possible. . Medications Reviewed . Discussed the role of diuretics in prevention of fluid overload and management of heart failure . Discussed atrial fibrillation and how atrial fibrillation can affect heart failure. . Reviewed upcoming provider appointments including cardiologist appointment 02/08/21 Self-Care Activities: -Attends all scheduled provider appointments -Performs ADL's independently -Takes medications as prescribed  Patient Goals: -  know when to call the doctor: increased weight gain more than 3 pounds overnight or 5 pounds in a week, increased swelling in stomach, hands, feet or legs, increased shortness of breath, if it is harder for you to breathe when lying down and need to sit up to breath or you feel that something is not  right. New or worsening dizziness, racing or irregular heartbeat that may be uncomfortable. -Call your cardiologist for worsening symptoms.  -Eat healthy: low salt, whole grains, fruits and vegetables, lean meats and healthy fats. May eat smaller portions (4-6 times a day) if needed to get nutrients in.  -Attend all scheduled provider appointments -Take medications as prescribed  Follow Up Plan: The patient has been provided with contact information for the care management team and has been advised to call with any health related questions or concerns.  The care management team will reach out to the patient again over the next 45 days.     Patient Care Plan: Anxiety    Problem Identified: Quality of life affected by anxiety in patient with Chronic condition: Heart failure, atrial fibrillation,memory loss   Priority: High    Long-Range Goal: Anxiety Symptoms Monitored and Managed   Start Date: 01/07/2021  Expected End Date: 04/08/2021  This Visit's Progress: On track  Priority: High  Current Barriers:  . Long term care plan for management of Quality of Life. Ms. Priscille Heidelberg is primary caregiver and assist with managing clients health care needs. She reports Mrs. Busser other children are also supportive. Currently, patient lives alone. Ms. Priscille Heidelberg prepares medication box. Someone goes to patient's home every day to ensure she takes her medication and assist as needed. Denies transportation needs, and family assist with meal preparation. Patient is independent with ADL's.  She reports client lives alone, but someone visits client every day and states she tries to take client on outsides as much as she can adding that diuretic affect often limits how long patient stays out. She states that she tries to keep client active in doing some of the things she loves like being out in her garden, but states client is no longer able to do what she used to do as a result of her health. Daughter reports that  client recently treated for anxiety stating addition of Zoloft has helped.       Chronic Disease Management support and education needs related to disease processes, available       community resources. . Unable to independently prepare hot meals and manage medication and other healthcare needs.  However, Patient has family support. Ms. Priscille Heidelberg states that currently, she does not need any additional resources as she feels, client is doing better with the addition of anxiety medication.  Nurse Case Manager Clinical Goal(s):  . patient will verbalize understanding of plan for management of health, anxiety and disease processes . patient will attend all scheduled medical appointments: cardiology visit scheduled for 02/08/21, primary care visit scheduled for  03/26/21  Interventions:  -1:1 collaboration with [name], [name], DO regarding development and update of comprehensive plan of care as evidenced by provider attestation and co-signature -Inter-disciplinary care team collaboration (see longitudinal plan of care) -Evaluation of current treatment plan related to disease processes and patient's adherence to plan as established by provider. -Advised patient to continue to provide meaningful experiences for client such as being out in her yard/garden, attend provider visits as scheduled, contact provider for any new or worsening symptoms. -Provided Exercise Activity booklet -Reviewed medications -  Provided patient and/or caregiver with information about potential resources such as PACE. -Reviewed scheduled/upcoming provider appointments including: cardiology office visit 02/08/21; primary care office scheduled 03/26/21. -Discussed plans with patient for ongoing care management follow up and provided patient with direct contact information for care management team -Discussed availability of embedded social worker. Daughter declines a need at this time.   Patient Goals/Self-Care Activities Take  medications as prescribed Patient will attend all scheduled provider appointments Patient/Caregiver will call pharmacy for medication refills Perform ADL's independently as tolerated Provide meaningful experiences spending outdoor time or time in Yard/Garden and outings as tolerated.  Fall prevention strategies: keep walkways clear, limit or take out throw rugs (could easily become trip hazards), keep good lighting in the home, keep a flashlight near your bed, arrange solid      pieces of furniture so that there may be a place to rest throughout the house if needed; change positions slowly. Grab bars in shower and/or next to toilet are useful. Avoid walking around barefoot and  don't walk around in slippers, stocking or socks. Instead wear low-heeled, comfortable shoes with rubber soles. Contact provider if you have fallen, even if there was no injury. Maintain some type of exercise program to maintain muscle tone/strength. Follow provider recommendations. Patient will call provider office for new concerns or questions Contact RN Case Manager if you would like social work assistance  Follow Up Plan: The patient has been provided with contact information for the care management team and has been advised to call with any health related questions or concerns.  The care management team will reach out to the patient again over the next 45 days.     Plan:The patient has been provided with contact information for the care management team and has been advised to call with any health related questions or concerns.  and The care management team will reach out to the patient again over the next 45 days.   Thea Silversmith, RN, MSN, BSN, CCM Care Management Coordinator [facility] [phone]

## 2021-01-07 NOTE — Patient Instructions (Addendum)
Visit Information: Thank you for taking the time to speak with me today.  PATIENT GOALS:  Goals Addressed            This Visit's Progress   . Quality of Life Maintained/Improved   On track    Timeframe:  Long-Range Goal Priority:  High Start Date:   01/07/21                          Expected End Date:   04/08/21                    Follow up date: 02/04/21  Patient Goals/Self-Care Activities Take medications as prescribed Patient will attend all scheduled provider appointments Patient/Caregiver will call pharmacy for medication refills Perform ADL's independently as tolerated Provide meaningful experiences spending outdoor time or time in Yard/Garden and outings as tolerated.  Fall prevention strategies: keep walkways clear, limit or take out throw rugs (could easily become trip hazards), keep good lighting in the home, keep a flashlight near your bed, arrange solid      pieces of furniture so that there may be a place to rest throughout the house if needed; change positions slowly. Grab bars in shower and/or next to toilet are useful. Avoid walking around barefoot and  don't walk around in slippers, stocking or socks. Instead wear low-heeled, comfortable shoes with rubber soles. Contact provider if you have fallen, even if there was no injury. Maintain some type of exercise program to maintain muscle tone/strength. Follow provider recommendations. Patient will call provider office for new concerns or questions Contact RN Case Manager if you would like social work assistance    . Track and Manage cardiac signs/symptoms   On track    Timeframe:  Long-Range Goal Priority:  Medium Start Date:  01/07/21                         Expected End Date:  04/08/21                     Follow Up Date 02/04/21    - know when to call the doctor: increased weight gain more than 3 pounds overnight or 5 pounds in a week, increased swelling in stomach, hands, feet or legs, increased shortness of breath, if  it is harder for you to breathe when lying down and need to sit up to breath or you feel that something is not right. New or worsening dizziness, racing or irregular heartbeat that may be uncomfortable. -Call your cardiologist for worsening symptoms.  -Eat healthy: low salt, whole grains, fruits and vegetables, lean meats and healthy fats. May eat smaller portions (4-6 times a day) if needed to get nutrients in.  -Attend all scheduled provider appointments -Take medications as prescribed   Why is this important?    You will be able to handle your symptoms better if you keep track of them.   Making some simple changes to your lifestyle will help.   Eating healthy is one thing you can do to take good care of yourself.    Notes:        Heart Failure Action Plan A heart failure action plan helps you understand what to do when you have symptoms of heart failure. Your action plan is a color-coded plan that lists the symptoms to watch for and indicates what actions to take.  If you have symptoms in the  red zone, you need medical care right away.  If you have symptoms in the yellow zone, you are having problems.  If you have symptoms in the green zone, you are doing well. Follow the plan that was created by you and your health care provider. Review your plan each time you visit your health care provider. Red zone These signs and symptoms mean you should get medical help right away:  You have trouble breathing when resting.  You have a dry cough that is getting worse.  You have swelling or pain in your legs or abdomen that is getting worse.  You suddenly gain more than 2-3 lb (0.9-1.4 kg) in 24 hours, or more than 5 lb (2.3 kg) in a week. This amount may be more or less depending on your condition.  You have trouble staying awake or you feel confused.  You have chest pain.  You do not have an appetite.  You pass out.  You have worsening sadness or depression. If you have any of  these symptoms, call your local emergency services (911 in the [place] right away. Do not drive yourself to the hospital.   Yellow zone These signs and symptoms mean your condition may be getting worse and you should make some changes:  You have trouble breathing when you are active, or you need to sleep with your head raised on extra pillows to help you breathe.  You have swelling in your legs or abdomen.  You gain 2-3 lb (0.9-1.4 kg) in 24 hours, or 5 lb (2.3 kg) in a week. This amount may be more or less depending on your condition.  You get tired easily.  You have trouble sleeping.  You have a dry cough. If you have any of these symptoms:  Contact your health care provider within the next day.  Your health care provider may adjust your medicines.   Green zone These signs mean you are doing well and can continue what you are doing:  You do not have shortness of breath.  You have very little swelling or no new swelling.  Your weight is stable (no gain or loss).  You have a normal activity level.  You do not have chest pain or any other new symptoms.   Follow these instructions at home:  Take over-the-counter and prescription medicines only as told by your health care provider.  Weigh yourself daily. Your target weight is __________ lb (__________ kg). ? Call your health care provider if you gain more than __________ lb (__________ kg) in 24 hours, or more than __________ lb (__________ kg) in a week. ? Health care provider name: _____________________________________________________ ? Health care provider phone number: _____________________________________________________  Eat a heart-healthy diet. Work with a diet and nutrition specialist (dietitian) to create an eating plan that is best for you.  Keep all follow-up visits. This is important. Where to find more information  American Heart Association: www.heart.org Summary  A heart failure action plan helps you  understand what to do when you have symptoms of heart failure.  Follow the action plan that was created by you and your health care provider.  Get help right away if you have any symptoms in the red zone. This information is not intended to replace advice given to you by your health care provider. Make sure you discuss any questions you have with your health care provider. Document Revised: 05/18/2020 Document Reviewed: 05/18/2020 Elsevier Patient Education  2021 [employer].  Consent to CCM Services: Ms.  Loveall was given information about Chronic Care Management services today including:  1. CCM service includes personalized support from designated clinical staff supervised by her physician, including individualized plan of care and coordination with other care providers 2. 24/7 contact phone numbers for assistance for urgent and routine care needs. 3. Service will only be billed when office clinical staff spend 20 minutes or more in a month to coordinate care. 4. Only one practitioner may furnish and bill the service in a calendar month. 5. The patient may stop CCM services at any time (effective at the end of the month) by phone call to the office staff. 6. The patient will be responsible for cost sharing (co-pay) of up to 20% of the service fee (after annual deductible is met).  Patient agreed to services and verbal consent obtained.   Patient verbalizes understanding of instructions provided today and agrees to view in [place].   The patient has been provided with contact information for the care management team and has been advised to call with any health related questions or concerns.  The care management team will reach out to the patient again over the next 45 days.   Thea Silversmith, RN, MSN, BSN, CCM Care Management Coordinator [facility] [phone]   CLINICAL CARE PLAN: Patient Care Plan: Cardiovascular Disease    Problem Identified: Symptom managment of  cardivascular disease   Priority: Medium    Long-Range Goal: Symptom Exacerbation Prevented or Minimized   Start Date: 01/07/2021  Expected End Date: 04/08/2021  This Visit's Progress: On track  Note:   Current Barriers:  Marland Kitchen Knowledge deficit related to long term care plan management of cardiovascular disease. Spoke with patient's daughter, Williemae Natter party release). Per Ms. Vinnefors, patient has dementia. Memory loss noted on chart. Ms. Priscille Heidelberg reports client cardiac history of a-fib and heart failure, HTN. She also reports patient has had a stroke without deficits in the past. Ms. Priscille Heidelberg is primary caregiver and assist with managing clients health care needs.  Patient does not keep up with daily weights. Family monitors for other signs of exacerbation such at shortness of breath and edema. Daughter states patient weighs at provider visits. Weight 154 pounds at provider visit on 11/05/20. Equipment used: Cane, blood pressure cuff and pulse oximeter as needed. Transportation provided by Ms. Vinnefors.   Case Manager Clinical Goal(s):  Marland Kitchen Patient/caregiver will verbalize understanding of management of cardiovascular disease and when to call doctor . Patient will take medications as prescribed. Interventions:  . Collaboration with Sierra Brooks, DO regarding development and update of comprehensive plan of care as evidenced by provider attestation and co-signature . Inter-disciplinary care team collaboration (see longitudinal plan of care) . Reviewed Heart Failure Action Plan and provided written copy . Discussed importance of monitoring weights in the management of CHF, reviewed signs/symptoms of exacerbation and importance of notifying provider as soon as possible. . Medications Reviewed . Discussed the role of diuretics in prevention of fluid overload and management of heart failure . Discussed atrial fibrillation and how atrial fibrillation can affect heart  failure. . Reviewed upcoming provider appointments including cardiologist appointment 02/08/21 Self-Care Activities: . Attends all scheduled provider appointments . Performs ADL's independently . Takes medications as prescribed  Patient Goals: - know when to call the doctor: increased weight gain more than 3 pounds overnight or 5 pounds in a week, increased swelling in stomach, hands, feet or legs, increased shortness of breath, if it is harder for you to breathe  when lying down and need to sit up to breath or you feel that something is not right. New or worsening dizziness, racing or irregular heartbeat that may be uncomfortable. -Call your cardiologist for worsening symptoms.  -Eat healthy: low salt, whole grains, fruits and vegetables, lean meats and healthy fats. May eat smaller portions (4-6 times a day) if needed to get nutrients in.  -Attend all scheduled provider appointments -Take medications as prescribed  Follow Up Plan: The patient has been provided with contact information for the care management team and has been advised to call with any health related questions or concerns.  The care management team will reach out to the patient again over the next 45 days.      Patient Care Plan: Anxiety    Problem Identified: Quality of life affected by anxiety in patient with Chronic condition: Heart failure, atrial fibrillation,memory loss   Priority: High    Long-Range Goal: Anxiety Symptoms Monitored and Managed   Start Date: 01/07/2021  Expected End Date: 04/08/2021  This Visit's Progress: On track  Priority: High  Note:   Current Barriers:  . Long term care plan for management of Quality of Life. Ms. Priscille Heidelberg is primary caregiver and assist with managing clients health care needs. She reports Mrs. Troiano other children are also supportive. Currently, patient lives alone. Ms. Priscille Heidelberg prepares medication box. Someone goes to patient's home every day to ensure she takes her  medication and assist as needed. Denies transportation needs, and family assist with meal preparation. Patient is independent with ADL's.  She reports client lives alone, but someone visits client every day and states she tries to take client on outsides as much as she can adding that diuretic affect often limits how long patient stays out. She states that she tries to keep client active in doing some of the things she loves like being out in her garden, but states client is no longer able to do what she used to do as a result of her health. Daughter reports that client recently treated for anxiety stating addition of Zoloft has helped.  . Chronic Disease Management support and education needs related to disease processes, available community resources. . Unable to independently prepare hot meals and manage medication and other healthcare needs.  However, Patient has family support. Ms. Priscille Heidelberg states that currently, she does not need any additional resources as she feels, client is doing better with the addition of anxiety medication.  Nurse Case Manager Clinical Goal(s):  . patient will verbalize understanding of plan for management of health, anxiety and disease processes . patient will attend all scheduled medical appointments: cardiology visit scheduled for 02/08/21, primary care visit scheduled for  03/26/21  Interventions:  . 1:1 collaboration with [name], [name], DO regarding development and update of comprehensive plan of care as evidenced by provider attestation and co-signature . Inter-disciplinary care team collaboration (see longitudinal plan of care) . Evaluation of current treatment plan related to disease processes and patient's adherence to plan as established by provider. . Advised patient to continue to provide meaningful experiences for client such as being out in her yard/garden, attend provider visits as scheduled, contact provider for any new or worsening  symptoms. . Provided Exercise Activity booklet . Reviewed medications . Provided patient and/or caregiver with information about potential resources such as PACE. Marland Kitchen Reviewed scheduled/upcoming provider appointments including: cardiology office visit 02/08/21; primary care office scheduled 03/26/21. Marland Kitchen Discussed plans with patient for ongoing care management follow up and  provided patient with direct contact information for care management team . Discussed availability of embedded social worker. Daughter declines a need at this time.   Patient Goals/Self-Care Activities Take medications as prescribed Patient will attend all scheduled provider appointments Patient/Caregiver will call pharmacy for medication refills Perform ADL's independently as tolerated Provide meaningful experiences spending outdoor time or time in Yard/Garden and outings as tolerated.  Fall prevention strategies: keep walkways clear, limit or take out throw rugs (could easily become trip hazards), keep good lighting in the home, keep a flashlight near your bed, arrange solid      pieces of furniture so that there may be a place to rest throughout the house if needed; change positions slowly. Grab bars in shower and/or next to toilet are useful. Avoid walking around barefoot and  don't walk around in slippers, stocking or socks. Instead wear low-heeled, comfortable shoes with rubber soles. Contact provider if you have fallen, even if there was no injury. Maintain some type of exercise program to maintain muscle tone/strength. Follow provider recommendations. Patient will call provider office for new concerns or questions Contact RN Case Manager if you would like social work assistance  Follow Up Plan: The patient has been provided with contact information for the care management team and has been advised to call with any health related questions or concerns.  The care management team will reach out to the patient again over the  next 45 days.

## 2021-02-02 ENCOUNTER — Telehealth (HOSPITAL_COMMUNITY): Payer: Self-pay

## 2021-02-02 NOTE — Telephone Encounter (Signed)
Called to reschedule mra. They do not want to reschedule at this time. AW

## 2021-02-04 ENCOUNTER — Other Ambulatory Visit: Payer: Self-pay

## 2021-02-04 ENCOUNTER — Telehealth: Payer: Medicare Other

## 2021-02-04 ENCOUNTER — Encounter (HOSPITAL_COMMUNITY): Payer: Self-pay

## 2021-02-04 ENCOUNTER — Emergency Department (HOSPITAL_COMMUNITY): Payer: Medicare Other

## 2021-02-04 ENCOUNTER — Inpatient Hospital Stay (HOSPITAL_COMMUNITY)
Admission: EM | Admit: 2021-02-04 | Discharge: 2021-02-08 | DRG: 291 | Disposition: A | Discharge status: Home Health | Payer: Medicare Other | Attending: Internal Medicine | Admitting: Internal Medicine

## 2021-02-04 DIAGNOSIS — F32A Depression, unspecified: Secondary | ICD-10-CM | POA: Diagnosis present

## 2021-02-04 DIAGNOSIS — E876 Hypokalemia: Secondary | ICD-10-CM | POA: Diagnosis present

## 2021-02-04 DIAGNOSIS — I5033 Acute on chronic diastolic (congestive) heart failure: Secondary | ICD-10-CM | POA: Diagnosis not present

## 2021-02-04 DIAGNOSIS — F419 Anxiety disorder, unspecified: Secondary | ICD-10-CM | POA: Diagnosis present

## 2021-02-04 DIAGNOSIS — I480 Paroxysmal atrial fibrillation: Secondary | ICD-10-CM | POA: Diagnosis present

## 2021-02-04 DIAGNOSIS — N390 Urinary tract infection, site not specified: Secondary | ICD-10-CM | POA: Diagnosis present

## 2021-02-04 DIAGNOSIS — R0602 Shortness of breath: Secondary | ICD-10-CM | POA: Diagnosis present

## 2021-02-04 DIAGNOSIS — R451 Restlessness and agitation: Secondary | ICD-10-CM | POA: Diagnosis present

## 2021-02-04 DIAGNOSIS — F0390 Unspecified dementia without behavioral disturbance: Secondary | ICD-10-CM | POA: Diagnosis present

## 2021-02-04 DIAGNOSIS — Z20822 Contact with and (suspected) exposure to covid-19: Secondary | ICD-10-CM | POA: Diagnosis present

## 2021-02-04 DIAGNOSIS — R627 Adult failure to thrive: Secondary | ICD-10-CM | POA: Diagnosis present

## 2021-02-04 DIAGNOSIS — I130 Hypertensive heart and chronic kidney disease with heart failure and stage 1 through stage 4 chronic kidney disease, or unspecified chronic kidney disease: Principal | ICD-10-CM | POA: Diagnosis present

## 2021-02-04 DIAGNOSIS — I5043 Acute on chronic combined systolic (congestive) and diastolic (congestive) heart failure: Secondary | ICD-10-CM | POA: Diagnosis present

## 2021-02-04 DIAGNOSIS — I482 Chronic atrial fibrillation, unspecified: Secondary | ICD-10-CM | POA: Diagnosis not present

## 2021-02-04 DIAGNOSIS — I10 Essential (primary) hypertension: Secondary | ICD-10-CM

## 2021-02-04 DIAGNOSIS — I509 Heart failure, unspecified: Secondary | ICD-10-CM

## 2021-02-04 DIAGNOSIS — I4819 Other persistent atrial fibrillation: Secondary | ICD-10-CM | POA: Diagnosis present

## 2021-02-04 DIAGNOSIS — Z66 Do not resuscitate: Secondary | ICD-10-CM | POA: Diagnosis present

## 2021-02-04 DIAGNOSIS — N1832 Chronic kidney disease, stage 3b: Secondary | ICD-10-CM

## 2021-02-04 DIAGNOSIS — Z7901 Long term (current) use of anticoagulants: Secondary | ICD-10-CM

## 2021-02-04 DIAGNOSIS — Z79899 Other long term (current) drug therapy: Secondary | ICD-10-CM | POA: Diagnosis not present

## 2021-02-04 DIAGNOSIS — Z79890 Hormone replacement therapy (postmenopausal): Secondary | ICD-10-CM | POA: Diagnosis not present

## 2021-02-04 DIAGNOSIS — N183 Chronic kidney disease, stage 3 unspecified: Secondary | ICD-10-CM | POA: Diagnosis present

## 2021-02-04 DIAGNOSIS — E039 Hypothyroidism, unspecified: Secondary | ICD-10-CM | POA: Diagnosis present

## 2021-02-04 DIAGNOSIS — I5023 Acute on chronic systolic (congestive) heart failure: Secondary | ICD-10-CM | POA: Diagnosis not present

## 2021-02-04 DIAGNOSIS — Z8673 Personal history of transient ischemic attack (TIA), and cerebral infarction without residual deficits: Secondary | ICD-10-CM

## 2021-02-04 LAB — CBC WITH DIFFERENTIAL/PLATELET
Abs Immature Granulocytes: 0.04 10*3/uL (ref 0.00–0.07)
Basophils Absolute: 0 10*3/uL (ref 0.0–0.1)
Basophils Relative: 0 %
Eosinophils Absolute: 0 10*3/uL (ref 0.0–0.5)
Eosinophils Relative: 0 %
HCT: 36 % (ref 36.0–46.0)
Hemoglobin: 11.7 g/dL — ABNORMAL LOW (ref 12.0–15.0)
Immature Granulocytes: 0 %
Lymphocytes Relative: 11 %
Lymphs Abs: 1 10*3/uL (ref 0.7–4.0)
MCH: 29.1 pg (ref 26.0–34.0)
MCHC: 32.5 g/dL (ref 30.0–36.0)
MCV: 89.6 fL (ref 80.0–100.0)
Monocytes Absolute: 1.1 10*3/uL — ABNORMAL HIGH (ref 0.1–1.0)
Monocytes Relative: 12 %
Neutro Abs: 7.2 10*3/uL (ref 1.7–7.7)
Neutrophils Relative %: 77 %
Platelets: 263 10*3/uL (ref 150–400)
RBC: 4.02 MIL/uL (ref 3.87–5.11)
RDW: 15.6 % — ABNORMAL HIGH (ref 11.5–15.5)
WBC: 9.5 10*3/uL (ref 4.0–10.5)
nRBC: 0 % (ref 0.0–0.2)

## 2021-02-04 LAB — COMPREHENSIVE METABOLIC PANEL
ALT: 13 U/L (ref 0–44)
AST: 23 U/L (ref 15–41)
Albumin: 3.7 g/dL (ref 3.5–5.0)
Alkaline Phosphatase: 129 U/L — ABNORMAL HIGH (ref 38–126)
Anion gap: 12 (ref 5–15)
BUN: 21 mg/dL (ref 8–23)
CO2: 18 mmol/L — ABNORMAL LOW (ref 22–32)
Calcium: 9.5 mg/dL (ref 8.9–10.3)
Chloride: 111 mmol/L (ref 98–111)
Creatinine, Ser: 1.7 mg/dL — ABNORMAL HIGH (ref 0.44–1.00)
GFR, Estimated: 29 mL/min — ABNORMAL LOW (ref >60)
Glucose, Bld: 110 mg/dL — ABNORMAL HIGH (ref 70–99)
Potassium: 3.8 mmol/L (ref 3.5–5.1)
Sodium: 141 mmol/L (ref 135–145)
Total Bilirubin: 0.5 mg/dL (ref 0.3–1.2)
Total Protein: 6.6 g/dL (ref 6.5–8.1)

## 2021-02-04 LAB — URINALYSIS, ROUTINE W REFLEX MICROSCOPIC
Bilirubin Urine: NEGATIVE
Glucose, UA: NEGATIVE mg/dL
Ketones, ur: NEGATIVE mg/dL
Nitrite: NEGATIVE
Protein, ur: NEGATIVE mg/dL
Specific Gravity, Urine: 1.006 (ref 1.005–1.030)
pH: 5 (ref 5.0–8.0)

## 2021-02-04 LAB — PROTIME-INR
INR: 1.6 — ABNORMAL HIGH (ref 0.8–1.2)
Prothrombin Time: 18.8 seconds — ABNORMAL HIGH (ref 11.4–15.2)

## 2021-02-04 LAB — BRAIN NATRIURETIC PEPTIDE: B Natriuretic Peptide: 305.9 pg/mL — ABNORMAL HIGH (ref 0.0–100.0)

## 2021-02-04 LAB — TROPONIN I (HIGH SENSITIVITY)
Troponin I (High Sensitivity): 5 ng/L (ref <18)
Troponin I (High Sensitivity): 5 ng/L (ref <18)

## 2021-02-04 LAB — LIPASE, BLOOD: Lipase: 40 U/L (ref 11–51)

## 2021-02-04 LAB — LACTIC ACID, PLASMA: Lactic Acid, Venous: 1.5 mmol/L (ref 0.5–1.9)

## 2021-02-04 MED ORDER — FUROSEMIDE 10 MG/ML IJ SOLN
40.0000 mg | Freq: Once | INTRAMUSCULAR | Status: AC
  Administered 2021-02-04: 40 mg via INTRAVENOUS
  Filled 2021-02-04: qty 4

## 2021-02-04 MED ORDER — LACTATED RINGERS IV SOLN: INTRAVENOUS | Status: DC

## 2021-02-04 MED ORDER — ACETAMINOPHEN 325 MG PO TABS
650.0000 mg | ORAL_TABLET | Freq: Four times a day (QID) | ORAL | Status: DC | PRN
  Administered 2021-02-05 – 2021-02-07 (×2): 650 mg via ORAL
  Filled 2021-02-04 (×2): qty 2

## 2021-02-04 MED ORDER — FUROSEMIDE 10 MG/ML IJ SOLN
40.0000 mg | Freq: Two times a day (BID) | INTRAMUSCULAR | Status: DC
  Administered 2021-02-05 – 2021-02-08 (×6): 40 mg via INTRAVENOUS
  Filled 2021-02-04 (×6): qty 4

## 2021-02-04 MED ORDER — ACETAMINOPHEN 650 MG RE SUPP: 650.0000 mg | Freq: Four times a day (QID) | RECTAL | Status: DC | PRN

## 2021-02-04 NOTE — H&P (Signed)
History and Physical    PLEASE NOTE THAT DRAGON DICTATION SOFTWARE WAS USED IN THE CONSTRUCTION OF THIS NOTE.   Belinda [name]:[id number] DOB: [birth year] DOA: 02/04/2021  PCP: Donato SchultzLowne Anderson, Belinda Anderson Patient coming from: home   I have personally briefly reviewed patient's [facility]  Chief Complaint: Shortness of breath  HPI: Belinda Anderson is a 87 y.o. 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   The following history is provided by the patient as well as her daughter, who was present at bedside, in addition to my discussions with the emergency department physician and via chart review.  Although the patient has a documented history of dementia, she lives by herself, with the patient's daughter checking on the patient daily.   The patient reports 1 week of progressive shortness of breath associated with orthopnea, requiring increasing number of pillows in order to prop herself up to a position of comfort from shortness of breath standpoint.  She also notes associated interval worsening of edema in the bilateral lower extremities in the absence of any associated erythema or calf tenderness.  The patient's daughter reports that the patient weighs herself on a nearly daily basis, and is noted an 8 to 9 pound weight gain over the last week.  Reports associated new onset nonproductive cough.  Denies any associated subjective fever, chills, rigors, or generalized myalgias.  Denies any associated.  Neck stiffness, rhinitis, rhinorrhea, sore throat,  wheezing, nausea, vomiting, abdominal pain, diarrhea, or rash.  No recent traveling or known COVID-19 exposures.  Denies any recent dysuria, gross hematuria, or change in urinary urgency/frequency.  She also denies any recent chest pain, diaphoresis, palpitations, hemoptysis.   Of note, most recent echocardiogram was performed in June 2021 and showed left ventricle of normal size, LVEF 65 to 70%, no focal wall motion normalities, unable to evaluate diastolic parameters, moderately dilated left atrium, severely dilated right atrium, moderate to severe mitral regurgitation and tricuspid regurgitation.  The doctor confirms that the patient is on Lasix 60 mg p.o. daily as her sole outpatient diuretic medication.  No recent changes to this regimen, and the daughter reports that the patient exhibits outstanding compliance in taking this medication every day.     ED Course:  Vital signs in the ED were notable for the following:  - Tetramex 97.6, heart rate 61-91; blood pressure 117/97 149/82; respiratory rate 18-23; oxygen saturation 96 to 99% on room air.  Labs were notable for the following: BMP was notable for the following: Bicarbonate 18, anion gap 12, creatinine 1.70 relative to most recent prior serum creatinine did 1 of 1.60 in January 2022.  BNP 306.  High-sensitivity troponin I x2 were both found to be 9 elevated and unchanged at 5.  CBC notable for white blood cell count of 9500 and hemoglobin 11.7.  Urinalysis showed no evidence of white blood cells, nasopharyngeal COVID-19/influenza PCR were performed in the ED today, and found to be negative.  EKG shows atrial fibrillation with ventricular rate 95, and no evidence of interval T wave or ST changes, including no evidence of ST elevation.  Chest x-ray shows no evidence of acute  cardiopulmonary process.   While in the ED, the following were administered: Lasix 40 mg IV x1.  Subsequently, the patient was admitted to the med telemetry floor for  further evaluation and management of suspected presenting acute on chronic diastolic heart failure, including need for additional IV diuresis.     Review of Systems: As per HPI otherwise 10 point review of systems negative.   Past Medical History:  Diagnosis Date  . Arthritis   . Atrial fibrillation (HCC)   . Hypertension   . Thyroid disease   . TIA (transient ischemic attack)     Past Surgical History:  Procedure Laterality Date  . CARDIOVERSION N/A 04/15/2020   Procedure: CARDIOVERSION;  Surgeon: Parke Poisson, MD;  Location: [facility] ENDOSCOPY;  Service: Cardiovascular;  Laterality: N/A;  . EYE SURGERY     lens implant  . IR ANGIO VERTEBRAL SEL SUBCLAVIAN INNOMINATE UNI Anderson MOD SED  09/30/2019  . IR CT HEAD LTD  09/30/2019  . IR PERCUTANEOUS ART THROMBECTOMY/INFUSION INTRACRANIAL INC DIAG ANGIO  09/30/2019  . RADIOLOGY WITH ANESTHESIA N/A 09/30/2019   Procedure: IR WITH ANESTHESIA;  Surgeon: Julieanne Cotton, MD;  Location: [facility] OR;  Service: Radiology;  Laterality: N/A;  . TEE WITHOUT CARDIOVERSION N/A 04/15/2020   Procedure: TRANSESOPHAGEAL ECHOCARDIOGRAM (TEE);  Surgeon: Parke Poisson, MD;  Location: [facility] ENDOSCOPY;  Service: Cardiovascular;  Laterality: N/A;    Social History:  reports that she has never smoked. She has never used smokeless tobacco. She reports that she does not drink alcohol and does not use drugs.   No Known Allergies  Family History  Problem Relation Age of Onset  . Arthritis Mother   . Heart disease Mother   . Heart disease Father 14       MI  . Coronary artery disease Other   . Diabetes Brother        DI     Prior to Admission medications   Medication Sig Start Date End Date Taking? Authorizing Provider  acetaminophen (TYLENOL) 650 MG CR tablet Take 1,300 mg by mouth daily as needed for pain (arthritis).     [provider]  COVID-19 mRNA vaccine, Pfizer, 30 MCG/0.3ML injection INJECT AS DIRECTED 08/14/20 08/14/21  Judyann Munson, MD  diclofenac Sodium (VOLTAREN) 1 % GEL Apply 2 g topically daily as needed (pain). 11/26/20   [name], Anderson  diltiazem (CARDIZEM CD) 180 MG 24 hr capsule TAKE 1 CAPSULE(180 MG) BY MOUTH DAILY 10/22/20   Camnitz, Andree Coss, MD  furosemide (LASIX) 20 MG tablet Take 2 tablets ( ) by mouth daily, may take and additional 20 mg as needed for swelling and edema Patient taking differently: Take 60 mg by mouth daily. 04/01/20   Graciella Freer, PA-C  Glycerin-Hypromellose-PEG 400 ([employer] DRY EYE RELIEF) 0.2-0.2-1 % SOLN Place 1 drop into both eyes daily as needed (Dry eye).    [provider]  levothyroxine (SYNTHROID) 75 MCG tablet Take 1 tablet (75 mcg total) by mouth daily before breakfast. 11/26/20   [name], Grayling Congress, Anderson  NONFORMULARY OR COMPOUNDED ITEM rollator walker  #1  As directed -------  Dx  CHF,  DOE,  OA 11/26/20   [name], Grayling Congress, Anderson  potassium chloride SA (KLOR-CON) 20 MEQ tablet Take 2 tablets (40 mEq total) by mouth daily. 11/26/20   Seabron Spates R, Anderson  psyllium (METAMUCIL) 0.52 g capsule Take 0.52 g by mouth daily.    [provider]  sertraline (ZOLOFT)  25 MG tablet Take 1 tablet (25 mg total) by mouth daily. 12/24/20   [name], Grayling Congress, Anderson  XARELTO 15 MG TABS tablet TAKE 1 TABLET(15 MG) BY MOUTH DAILY WITH BREAKFAST DISCONTINUE ELIQUIS 07/13/20   Newman Nip, NP     Objective    Physical Exam: Vitals:   02/04/21 2041 02/04/21 2130 02/04/21 2200 02/04/21 2236  BP: 135/78 (!) 117/97 124/77 (!) 149/82  Pulse: 91 (!) 115 61 89  Resp: (!) 22 17 (!) 21 (!) 23  Temp: (!) 97.3 F (36.3 C)     TempSrc: Oral     SpO2: 99% 99% 92% 98%  Weight: 63.5 kg     Height: 5' 7.5" (1.715 m)       General: appears to be stated age; alert Skin: warm, dry, no rash Head:  AT/[place] Mouth:  Oral mucosa membranes appear moist, normal dentition Neck: supple; trachea midline Heart:  irregular; 2/6 holosystolic murmur  appreciated Lungs: CTAB, did not appreciate any wheezes, rales, or rhonchi Abdomen: + BS; soft, ND, NT Vascular: 2+ pedal pulses b/l; 2+ radial pulses b/l Extremities: 2+ edema in bilateral lower extremities, no muscle wasting Neuro: strength and sensation intact in upper and lower extremities b/l   Labs on Admission: I have personally reviewed following labs and imaging studies  CBC: Recent Labs  Lab 02/04/21 2108  WBC 9.5  NEUTROABS 7.2  HGB 11.7*  HCT 36.0  MCV 89.6  PLT 263   Basic Metabolic Panel: Recent Labs  Lab 02/04/21 2108  NA 141  K 3.8  CL 111  CO2 18*  GLUCOSE 110*  BUN 21  CREATININE 1.70*  CALCIUM 9.5   GFR: Estimated Creatinine Clearance: 23.6 mL/min (A) (by C-G formula based on SCr of 1.7 mg/dL (H)). Liver Function Tests: Recent Labs  Lab 02/04/21 2108  AST 23  ALT 13  ALKPHOS 129*  BILITOT 0.5  PROT 6.6  ALBUMIN 3.7   Recent Labs  Lab 02/04/21 2108  LIPASE 40   No results for input(s): AMMONIA in the last 168 hours. Coagulation Profile: Recent Labs  Lab 02/04/21 2108  INR 1.6*   Cardiac Enzymes: No results for input(s): CKTOTAL, CKMB, CKMBINDEX, TROPONINI in the last 168 hours. BNP (last 3 results) Recent Labs    10/22/20 1219 11/05/20 1236 11/26/20 1335  PROBNP 1,896* 2,078* 411.0*   HbA1C: No results for input(s): HGBA1C in the last 72 hours. CBG: No results for input(s): GLUCAP in the last 168 hours. Lipid Profile: No results for input(s): CHOL, HDL, LDLCALC, TRIG, CHOLHDL, LDLDIRECT in the last 72 hours. Thyroid Function Tests: No results for input(s): TSH, T4TOTAL, FREET4, T3FREE, THYROIDAB in the last 72 hours. Anemia Panel: No results for input(s): VITAMINB12, FOLATE, FERRITIN, TIBC, IRON, RETICCTPCT in the last 72 hours. Urine analysis:    Component Value Date/Time   COLORURINE YELLOW 02/04/2021 2226   APPEARANCEUR CLEAR 02/04/2021 2226   LABSPEC 1.006 02/04/2021 2226   PHURINE 5.0 02/04/2021 2226    GLUCOSEU NEGATIVE 02/04/2021 2226   GLUCOSEU NEGATIVE 12/02/2019 1457   HGBUR MODERATE (A) 02/04/2021 2226   HGBUR trace-lysed 11/24/2009 0915   BILIRUBINUR NEGATIVE 02/04/2021 2226   BILIRUBINUR Neg 02/20/2013 1415   KETONESUR NEGATIVE 02/04/2021 2226   PROTEINUR NEGATIVE 02/04/2021 2226   UROBILINOGEN 0.2 12/02/2019 1457   NITRITE NEGATIVE 02/04/2021 2226   LEUKOCYTESUR LARGE (A) 02/04/2021 2226    Radiological Exams on Admission: DG Chest Port 1 View  Result Date: 02/04/2021 CLINICAL DATA:  Shortness  of breath EXAM: PORTABLE CHEST 1 VIEW COMPARISON:  10/22/2020 FINDINGS: Cardiac shadow is at the upper limits of normal in size. Lungs are clear bilaterally. No bony abnormality is seen. IMPRESSION: No active disease. Electronically Signed   By: Alcide Clever M.D.   On: 02/04/2021 23:15     EKG: Independently reviewed, with result as described above.    Assessment/Plan   Tynslee Bowlds is a 87 y.o. 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    Principal Problem:   Acute on chronic diastolic (congestive) heart failure (HCC) Active Problems:   Hypothyroidism   AF (paroxysmal atrial fibrillation) (HCC)   SOB (shortness of breath)   CKD (chronic kidney disease), stage III (HCC)    #) Acute on chronic diastolic heart failure: In the context of documented history of such, with most recent prior echocardiogram performed in June 2021, as further detailed above, the patient presents with 1 week of progressive shortness of breath associated with orthopnea, worsening peripheral  edema, and 8 pound weight gain over that time, with evidence of increased work of breathing identified at time of presentation.  This occurred in spite of good compliance with home Lasix 60 mg p.o. daily.  An additional consideration increasing patient's risk for acutely decompensated heart failure includes moderate to severe mitral regurgitation and tricuspid regurgitation identified on most recent prior echocardiogram.  Given that most recent echocardiogram occurred approximately a year ago, will repeat echocardiogram following improvement in volume status, including for evaluation of interval valvular pathology.  Plan: Lasix 40 mg IV twice daily.  Monitor strict I's and O's and daily weights.  Add on serum magnesium level.  Repeat BMP in the morning.  Monitor on telemetry.  Monitor continuous pulse oximetry.  Echocardiogram has been ordered for the morning.      #) Persistent atrial fibrillation: Documented history of such. In the setting of a CHA2DS2-VASc score of 8, there is an indication for the patient to be on chronic anticoagulation for thromboembolic prophylaxis. Consistent with this, the patient is chronically anticoagulated on Xarelto. Home AV nodal blocking regimen: Diltiazem.  Most recent echocardiogram was performed in June 2021, as further detailed above. Presenting EKG reflects rate controlled atrial fibrillation.   Plan: monitor strict I's & O's and daily weights. Repeat BMP and CBC in the morning. Check serum magnesium level. Continue home AV nodal blocking regimen, as above.  Continue Xarelto.      #) Acquired hypothyroidism: On Synthroid as an outpatient.  Plan: Check TSH.  Continue home dose of Synthroid for now.      #) Stage IIIb chronic kidney disease: Associated with baseline creatinine range of 1.6-1.8, presenting labs reflect serum creatinine within this baseline range.  Warrant close monitoring of ensuing renal function given plan for IV diuresis efforts in the  setting of suspected presenting acute on chronic diastolic heart failure, as above.    Plan: Monitor strict I's and O's and daily weights.  Attempt to avoid nephrotoxic agents.  Repeat BMP in the morning.  Check ionized calcium and serum magnesium levels.      DVT prophylaxis: Continue home Xarelto Code Status: The patient and her daughter confirm this evening  that the patient is to be DNR/DNI Family Communication:  I discussed the patient's case with her daughter, who is present at bedside Disposition Plan: Per Rounding Team Consults called: none  Admission status: Inpatient; med telemetry     Of note, this patient was added by me to the following Admit List/Treatment Team: [email or web address].      PLEASE NOTE THAT DRAGON DICTATION SOFTWARE WAS USED IN THE CONSTRUCTION OF THIS NOTE.   Angie Fava Anderson [facility] Pager [phone] From 7PM - 7AM   02/04/2021, 11:50 PM

## 2021-02-04 NOTE — ED Provider Notes (Signed)
[facility]-EMERGENCY DEPT Provider Note   CSN: [id number] Arrival date & time: 02/04/21  2021     History Chief Complaint  Patient presents with  . Abdominal Pain  . Anxiety  . Failure To Thrive    Belinda Anderson is a 87 y.o. female.  HPI Patient is not really sure why she had to come to the emergency department.  She think she was feeling well before she came.  At this time she reports that she is having trouble catching her breath.  She reports she really wants to get out of here.  She is hyperventilating and extremely anxious in appearance.  She is interacting with me appropriately.  EMS report is that the patient had abdominal pain.  When asked about that in the abdomen examined, she did then reports that she has been gassy.  The EMS report per family is also that she has not been eating much lately.  Patient's daughter arrived and gave additional history.  She reports that the patient has been short of breath for about a week.  She reports she is getting very dyspneic with exertion.  She reports her abdomen has been distended and these findings are consistent with when she gets congestive heart failure.  Reports they are trying to give her Lasix 60 mg and she is urinating but continues to have weight increase despite eating very little.  She reports she gained about 8 pounds and has hardly been eating.  Patient daughter reports that her physician told her if the symptoms did not improve with home Lasix that patient would probably need to be admitted to the hospital.    Past Medical History:  Diagnosis Date  . Arthritis   . Atrial fibrillation (HCC)   . Hypertension   . Thyroid disease   . TIA (transient ischemic attack)     Patient Active Problem List   Diagnosis Date Noted  . Acute on chronic diastolic (congestive) heart failure (HCC) 02/04/2021  . Anxiety 12/24/2020  . Impacted cerumen of right ear 12/24/2020  . Acute on chronic diastolic CHF  (congestive heart failure) (HCC) 11/26/2020  . Lower extremity edema 01/30/2020  . COVID-19 virus infection 11/21/2019  . Cough 11/21/2019  . Hyperlipidemia associated with type 2 diabetes mellitus (HCC) 10/12/2019  . External hemorrhoid, bleeding 10/12/2019  . Atrial fibrillation (HCC) 10/02/2019  . 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 Middle cerebral artery embolism, right 09/30/2019  . Contusion of foot including toes, right, initial encounter 08/10/2018  . Primary osteoarthritis of right knee 07/31/2018  . Right leg swelling 07/17/2018  . Pain of right calf 07/17/2018  . Preventative health care 09/18/2017  . Low back pain radiating to left leg 11/28/2016  . Hyperlipidemia 02/02/2015  . Sciatica 07/16/2014  . Knee pain, acute 07/16/2014  . Lower extremity pain, right 07/16/2014  . Skin infection 07/16/2014  . Incontinence of urine in female 06/19/2013  . Cerebral aneurysm rupture (HCC) 04/08/2013  . LEG EDEMA, RIGHT 07/06/2010  . OTHER DRUG ALLERGY 07/06/2010  . DIARRHEA 02/12/2010  . HEMATURIA, HX OF 11/24/2009  . ANEURYSM, HX OF 08/03/2009  . B12 deficiency 07/24/2009  . 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 SYNCOPE 07/20/2009  . DIZZINESS 07/20/2009  . Memory loss 07/20/2009  . 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 Hypothyroidism 12/14/2007  . Essential hypertension 12/14/2007  . DIVERTICULITIS, HX OF 12/14/2007  . GOITER 01/30/2007  .  HYPERTENSION, BENIGN 01/30/2007  . UTERINE POLYP 01/30/2007  . THYROIDECTOMY, HX OF 01/30/2007    Past Surgical History:  Procedure Laterality Date  . CARDIOVERSION N/A 04/15/2020   Procedure: CARDIOVERSION;  Surgeon: Parke PoissonAcharya, Gayatri A, MD;  [facility];  Service: Cardiovascular;  Laterality: N/A;  . EYE SURGERY     lens implant  . IR ANGIO VERTEBRAL SEL SUBCLAVIAN INNOMINATE UNI R MOD SED  09/30/2019  . IR CT HEAD LTD  09/30/2019  . IR  PERCUTANEOUS ART THROMBECTOMY/INFUSION INTRACRANIAL INC DIAG ANGIO  09/30/2019  . RADIOLOGY WITH ANESTHESIA N/A 09/30/2019   Procedure: IR WITH ANESTHESIA;  Surgeon: Julieanne Cottoneveshwar, Sanjeev, MD;  Location: [facility] OR;  Service: Radiology;  Laterality: N/A;  . TEE WITHOUT CARDIOVERSION N/A 04/15/2020   Procedure: TRANSESOPHAGEAL ECHOCARDIOGRAM (TEE);  Surgeon: Parke PoissonAcharya, Gayatri A, MD;  [facility];  Service: Cardiovascular;  Laterality: N/A;     OB History   No obstetric history on file.     Family History  Problem Relation Age of Onset  . Arthritis Mother   . Heart disease Mother   . Heart disease Father [id number]       MI  . Coronary artery disease Other   . Diabetes Brother        DI    Social History   Tobacco Use  . Smoking status: Never Smoker  . Smokeless tobacco: Never Used  Vaping Use  . Vaping Use: Never used  Substance Use Topics  . Alcohol use: No  . Drug use: No    Home Medications Prior to Admission medications   Medication Sig Start Date End Date Taking? Authorizing Provider  acetaminophen (TYLENOL) 650 MG CR tablet Take 1,300 mg by mouth daily as needed for pain (arthritis).     [provider]  COVID-19 mRNA vaccine, Pfizer, 30 MCG/0.3ML injection INJECT AS DIRECTED 08/14/20 08/14/21  Judyann MunsonSnider, Cynthia, MD  diclofenac Sodium (VOLTAREN) 1 % GEL Apply 2 g topically daily as needed (pain). 11/26/20   Donato SchultzLowne Chase, Yvonne R, DO  diltiazem (CARDIZEM CD) 180 MG 24 hr capsule TAKE 1 CAPSULE(180 MG) BY MOUTH DAILY 10/22/20   Camnitz, Andree CossWill Martin, MD  furosemide (LASIX) 20 MG tablet Take 2 tablets (40mg ) by mouth daily, may take and additional 20 mg as needed for swelling and edema Patient taking differently: Take 60 mg by mouth daily. 04/01/20   Graciella Freerillery, Michael Andrew, PA-C  Glycerin-Hypromellose-PEG 400 ([employer] DRY EYE RELIEF) 0.2-0.2-1 % SOLN Place 1 drop into both eyes daily as needed (Dry eye).    [provider]  levothyroxine (SYNTHROID) 75 MCG tablet Take  1 tablet (75 mcg total) by mouth daily before breakfast. 11/26/20   Zola ButtonLowne Chase, Grayling CongressYvonne R, DO  NONFORMULARY OR COMPOUNDED ITEM rollator walker  #1  As directed -------  Dx  CHF,  DOE,  OA 11/26/20   Zola ButtonLowne Chase, Grayling CongressYvonne R, DO  potassium chloride SA (KLOR-CON) 20 MEQ tablet Take 2 tablets (40 mEq total) by mouth daily. 11/26/20   Seabron SpatesLowne Chase, Yvonne R, DO  psyllium (METAMUCIL) 0.52 g capsule Take 0.52 g by mouth daily.    [provider]  sertraline (ZOLOFT) 25 MG tablet Take 1 tablet (25 mg total) by mouth daily. 12/24/20   Lowne Chase, Yvonne R, DO  XARELTO 15 MG TABS tablet TAKE 1 TABLET(15 MG) BY MOUTH DAILY WITH BREAKFAST DISCONTINUE ELIQUIS 07/13/20   Newman Niparroll, Donna C, NP    Allergies    Patient has no known allergies.  Review of Systems  Review of Systems Level 5 caveat cannot obtain review of systems due to dementia and anxiety, unreliable Physical Exam Updated Vital Signs BP (!) 149/82   Pulse 89   Temp (!) 97.3 F (36.3 C) (Oral)   Resp (!) 23   Ht 5' 7.5" (1.715 m)   Wt 63.5 kg   SpO2 98%   BMI 21.60 kg/m   Physical Exam Constitutional:      Comments: Patient is alert and extremely anxious in appearance.  She is hyperventilating and tremulous.  Color is good.  HENT:     Nose: Nose normal.     Mouth/Throat:     Mouth: Mucous membranes are moist.     Pharynx: Oropharynx is clear.  Eyes:     Extraocular Movements: Extraocular movements intact.     Conjunctiva/sclera: Conjunctivae normal.  Cardiovascular:     Rate and Rhythm: Normal rate and regular rhythm.  Pulmonary:     Comments: Patient is tachypneic.  Breath sounds diminished at the bases.  Occasional crackle. Abdominal:     Comments: Abdomen soft.  Patient does not seem to have abdominal discomfort although after performing the exam she reports then that it made her lower abdomen hurt.  Musculoskeletal:        General: Normal range of motion.     Comments: Trace to 1+ edema around the ankles.  Calf soft  and pliable  Skin:    General: Skin is warm and dry.  Neurological:     Comments: Patient is very anxious and tremulous.  She does not have focal neurologic deficit.  Speech is clear.  She is following commands for grip strength and moving forward in the stretcher.     ED Results / Procedures / Treatments   Labs (all labs ordered are listed, but only abnormal results are displayed) Labs Reviewed  COMPREHENSIVE METABOLIC PANEL - Abnormal; Notable for the following components:      Result Value   CO2 18 (*)    Glucose, Bld 110 (*)    Creatinine, Ser 1.70 (*)    Alkaline Phosphatase 129 (*)    GFR, Estimated 29 (*)    All other components within normal limits  BRAIN NATRIURETIC PEPTIDE - Abnormal; Notable for the following components:   B Natriuretic Peptide 305.9 (*)    All other components within normal limits  CBC WITH DIFFERENTIAL/PLATELET - Abnormal; Notable for the following components:   Hemoglobin 11.7 (*)    RDW 15.6 (*)    Monocytes Absolute 1.1 (*)    All other components within normal limits  PROTIME-INR - Abnormal; Notable for the following components:   Prothrombin Time 18.8 (*)    INR 1.6 (*)    All other components within normal limits  URINALYSIS, ROUTINE W REFLEX MICROSCOPIC - Abnormal; Notable for the following components:   Hgb urine dipstick MODERATE (*)    Leukocytes,Ua LARGE (*)    Bacteria, UA RARE (*)    All other components within normal limits  RESP PANEL BY RT-PCR (FLU A&B, COVID) ARPGX2  LIPASE, BLOOD  LACTIC ACID, PLASMA  TROPONIN I (HIGH SENSITIVITY)  TROPONIN I (HIGH SENSITIVITY)    EKG EKG Interpretation  Date/Time:  Thursday February 04 2021 20:43:02 EDT Ventricular Rate:  95 PR Interval:    QRS Duration: 119 QT Interval:  385 QTC Calculation: 484 R Axis:   54 Text Interpretation: Atrial fibrillation Incomplete right bundle branch block Low voltage, extremity and precordial leads no sig change from previous Confirmed by  Arby Barrette  [phone]) on 02/04/2021 11:01:15 PM   Radiology DG Chest Port 1 View  Result Date: 02/04/2021 CLINICAL DATA:  Shortness of breath EXAM: PORTABLE CHEST 1 VIEW COMPARISON:  10/22/2020 FINDINGS: Cardiac shadow is at the upper limits of normal in size. Lungs are clear bilaterally. No bony abnormality is seen. IMPRESSION: No active disease. Electronically Signed   By: Alcide Clever M.D.   On: 02/04/2021 23:15    Procedures Procedures   Medications Ordered in ED Medications  lactated ringers infusion ( Intravenous New Bag/Given 02/04/21 2120)  furosemide (LASIX) injection 40 mg (40 mg Intravenous Given 02/04/21 2331)    ED Course  I have reviewed the triage vital signs and the nursing notes.  Pertinent labs & imaging results that were available during my care of the patient were reviewed by me and considered in my medical decision making (see chart for details).    MDM Rules/Calculators/A&P                         Patient's daughter reports symptoms developing over about a week.  She reports that when she gets congestive heart failure it also exacerbates her anxiety.  She reports that for a week she has been having increased respiratory effort and particularly with exertion.  Also mild cough but no fever or vomiting.  Patient's daughter reports that occasionally she reports abdominal pain to her other sister but she has not noted her to be in significant abdominal pain.  No vomiting.  She reports that she has been having normal bowel movements.  At this time, mild elevation in BNP and increased work of breathing with weight gain consistent with CHF exacerbation.  Will initiate Lasix and plan for admission.  Patient has chronic atrial fibrillation on Xarelto.  Rate is controlled at this time.  Consult: Admit to [facility] hospitalist  Final Clinical Impression(s) / ED Diagnoses Final diagnoses:  Chronic atrial fibrillation (HCC)  Acute on chronic congestive heart failure, unspecified heart failure  type [facility])    Rx / DC Orders ED Discharge Orders    None       Arby Barrette, MD 02/04/21 2335

## 2021-02-04 NOTE — ED Notes (Signed)
Patient becoming agitated, frustrated and unable to console. Dr. Donnald Garre notified.

## 2021-02-04 NOTE — ED Triage Notes (Signed)
Pt to ED by EMS from home with c/o lower abdominal pain, anxiety, decreased PO intake (per family). Pt states pain is "gassy" in nature. Hx of dementia. Arrives A+O to baseline, on NRB to remind her to slow her breathing down, VSS, NADN. CBG 119.

## 2021-02-05 ENCOUNTER — Telehealth: Payer: Self-pay | Admitting: Cardiology

## 2021-02-05 ENCOUNTER — Encounter (HOSPITAL_COMMUNITY): Payer: Self-pay | Admitting: Internal Medicine

## 2021-02-05 ENCOUNTER — Inpatient Hospital Stay (HOSPITAL_COMMUNITY): Payer: Medicare Other

## 2021-02-05 DIAGNOSIS — I5033 Acute on chronic diastolic (congestive) heart failure: Secondary | ICD-10-CM | POA: Diagnosis not present

## 2021-02-05 DIAGNOSIS — I5023 Acute on chronic systolic (congestive) heart failure: Secondary | ICD-10-CM | POA: Diagnosis not present

## 2021-02-05 DIAGNOSIS — R0602 Shortness of breath: Secondary | ICD-10-CM | POA: Diagnosis present

## 2021-02-05 DIAGNOSIS — N183 Chronic kidney disease, stage 3 unspecified: Secondary | ICD-10-CM | POA: Diagnosis present

## 2021-02-05 LAB — BASIC METABOLIC PANEL
Anion gap: 13 (ref 5–15)
BUN: 18 mg/dL (ref 8–23)
CO2: 24 mmol/L (ref 22–32)
Calcium: 9.3 mg/dL (ref 8.9–10.3)
Chloride: 108 mmol/L (ref 98–111)
Creatinine, Ser: 1.62 mg/dL — ABNORMAL HIGH (ref 0.44–1.00)
GFR, Estimated: 31 mL/min — ABNORMAL LOW (ref >60)
Glucose, Bld: 109 mg/dL — ABNORMAL HIGH (ref 70–99)
Potassium: 3 mmol/L — ABNORMAL LOW (ref 3.5–5.1)
Sodium: 145 mmol/L (ref 135–145)

## 2021-02-05 LAB — ECHOCARDIOGRAM COMPLETE
AR max vel: 1.35 cm2
AV Area VTI: 1.49 cm2
AV Area mean vel: 1.47 cm2
AV Mean grad: 4 mmHg
AV Peak grad: 7.6 mmHg
Ao pk vel: 1.38 m/s
Area-P 1/2: 3.72 cm2
Height: 67.5 in
S' Lateral: 2.6 cm
Weight: 2240 oz

## 2021-02-05 LAB — RESP PANEL BY RT-PCR (FLU A&B, COVID) ARPGX2
Influenza A by PCR: NEGATIVE
Influenza B by PCR: NEGATIVE
SARS Coronavirus 2 by RT PCR: NEGATIVE

## 2021-02-05 LAB — CBC
HCT: 41.7 % (ref 36.0–46.0)
Hemoglobin: 13.3 g/dL (ref 12.0–15.0)
MCH: 29 pg (ref 26.0–34.0)
MCHC: 31.9 g/dL (ref 30.0–36.0)
MCV: 90.8 fL (ref 80.0–100.0)
Platelets: 277 10*3/uL (ref 150–400)
RBC: 4.59 MIL/uL (ref 3.87–5.11)
RDW: 15.7 % — ABNORMAL HIGH (ref 11.5–15.5)
WBC: 7.8 10*3/uL (ref 4.0–10.5)
nRBC: 0 % (ref 0.0–0.2)

## 2021-02-05 LAB — MAGNESIUM
Magnesium: 2 mg/dL (ref 1.7–2.4)
Magnesium: 2.2 mg/dL (ref 1.7–2.4)

## 2021-02-05 LAB — PHOSPHORUS: Phosphorus: 3.3 mg/dL (ref 2.5–4.6)

## 2021-02-05 LAB — TSH: TSH: 1.738 u[IU]/mL (ref 0.350–4.500)

## 2021-02-05 MED ORDER — POTASSIUM CHLORIDE 20 MEQ PO PACK
40.0000 meq | PACK | Freq: Once | ORAL | Status: AC
  Administered 2021-02-05: 40 meq via ORAL
  Filled 2021-02-05: qty 2

## 2021-02-05 MED ORDER — DILTIAZEM HCL ER COATED BEADS 180 MG PO CP24
180.0000 mg | ORAL_CAPSULE | Freq: Every day | ORAL | Status: DC
  Administered 2021-02-05 – 2021-02-08 (×4): 180 mg via ORAL
  Filled 2021-02-05 (×4): qty 1

## 2021-02-05 MED ORDER — ADULT MULTIVITAMIN W/MINERALS CH
1.0000 | ORAL_TABLET | Freq: Every day | ORAL | Status: DC
  Administered 2021-02-05 – 2021-02-08 (×4): 1 via ORAL
  Filled 2021-02-05 (×4): qty 1

## 2021-02-05 MED ORDER — HALOPERIDOL 1 MG PO TABS
1.0000 mg | ORAL_TABLET | Freq: Three times a day (TID) | ORAL | Status: DC | PRN
Start: 2021-02-05 — End: 2021-02-08
  Administered 2021-02-05 – 2021-02-08 (×5): 1 mg via ORAL
  Filled 2021-02-05 (×7): qty 1

## 2021-02-05 MED ORDER — RIVAROXABAN 15 MG PO TABS
15.0000 mg | ORAL_TABLET | Freq: Every day | ORAL | Status: DC
  Administered 2021-02-05 – 2021-02-08 (×4): 15 mg via ORAL
  Filled 2021-02-05 (×4): qty 1

## 2021-02-05 MED ORDER — ENSURE ENLIVE PO LIQD
237.0000 mL | ORAL | Status: DC
  Administered 2021-02-06 – 2021-02-07 (×2): 237 mL via ORAL

## 2021-02-05 MED ORDER — SODIUM CHLORIDE 0.9 % IV SOLN
1.0000 g | Freq: Every day | INTRAVENOUS | Status: AC
  Administered 2021-02-05 – 2021-02-07 (×3): 1 g via INTRAVENOUS
  Filled 2021-02-05 (×3): qty 1

## 2021-02-05 MED ORDER — LEVOTHYROXINE SODIUM 75 MCG PO TABS
75.0000 ug | ORAL_TABLET | Freq: Every day | ORAL | Status: DC
  Administered 2021-02-05 – 2021-02-08 (×4): 75 ug via ORAL
  Filled 2021-02-05 (×4): qty 1

## 2021-02-05 MED ORDER — PROSOURCE PLUS PO LIQD
30.0000 mL | Freq: Two times a day (BID) | ORAL | Status: DC
  Administered 2021-02-05 – 2021-02-08 (×6): 30 mL via ORAL
  Filled 2021-02-05 (×6): qty 30

## 2021-02-05 MED ORDER — SERTRALINE HCL 25 MG PO TABS
25.0000 mg | ORAL_TABLET | Freq: Every day | ORAL | Status: DC
  Administered 2021-02-05 – 2021-02-08 (×4): 25 mg via ORAL
  Filled 2021-02-05 (×4): qty 1

## 2021-02-05 MED ORDER — ALPRAZOLAM 0.5 MG PO TABS
0.5000 mg | ORAL_TABLET | Freq: Once | ORAL | Status: AC
  Administered 2021-02-05: 0.5 mg via ORAL
  Filled 2021-02-05: qty 1

## 2021-02-05 NOTE — Progress Notes (Signed)
  Echocardiogram 2D Echocardiogram has been performed.  Belinda Anderson 02/05/2021, 4:13 PM

## 2021-02-05 NOTE — Plan of Care (Signed)
  Problem: Cardiac: Goal: Ability to achieve and maintain adequate cardiopulmonary perfusion will improve Outcome: Progressing   Problem: Nutrition: Goal: Adequate nutrition will be maintained Outcome: Progressing   Problem: Education: Goal: Ability to demonstrate management of disease process will improve Outcome: Not Progressing Goal: Ability to verbalize understanding of medication therapies will improve Outcome: Not Progressing

## 2021-02-05 NOTE — TOC Initial Note (Signed)
Transition of Care [facility]) - Initial/Assessment Note    Patient Details  Name: Belinda Anderson MRN: [id number] Date of Birth: [birth year]  [facility]) CM/SW Contact:    Lanier Clam, RN Phone Number: 02/05/2021, 3:17 PM  Clinical Narrative: Jerilynn Birkenhead to dtr Marie-d/c plan rehab if needed. From home,alone-dtr [name] provides asst daily. PT cons-await recc.Alert x1,gen'l reddened,moisture,rash skin-arrier cream, anti psychotic meds. Dtr [name] agree to SNF if recc.                  Expected Discharge Plan: Skilled Nursing Facility Barriers to Discharge: Continued Medical Work up   Patient Goals and CMS Choice Patient states their goals for this hospitalization and ongoing recovery are:: go to rehab CMS Medicare.gov Compare Post Acute Care list provided to:: Patient Represenative (must comment) [name] dtr [phone]) Choice offered to / list presented to : Adult Children  Expected Discharge Plan and Services Expected Discharge Plan: Skilled Nursing Facility   Discharge Planning Services: CM Consult Post Acute Care Choice: Skilled Nursing Facility Living arrangements for the past 2 months: Single Family Home                                      Prior Living Arrangements/Services Living arrangements for the past 2 months: Single Family Home Lives with:: Self Patient language and need for interpreter reviewed:: Yes Do you feel safe going back to the place where you live?: Yes      Need for Family Participation in Patient Care: No (Comment) Care giver support system in place?: Yes (comment) Current home services: DME (cane, rw,3n1) Criminal Activity/Legal Involvement Pertinent to Current Situation/Hospitalization: No - Comment as needed  Activities of Daily Living Home Assistive Devices/Equipment: Eyeglasses ADL Screening (condition at time of admission) Patient's cognitive ability adequate to safely complete daily activities?: No Is the patient deaf or  have difficulty hearing?: No Does the patient have difficulty seeing, even when wearing glasses/contacts?: No Does the patient have difficulty concentrating, remembering, or making decisions?: Yes Patient able to express need for assistance with ADLs?: No Does the patient have difficulty dressing or bathing?: Yes Independently performs ADLs?: No Does the patient have difficulty walking or climbing stairs?: Yes Weakness of Legs: Both Weakness of Arms/Hands: None  Permission Sought/Granted Permission sought to share information with : Case Manager Permission granted to share information with : Yes, Verbal Permission Granted  Share Information with NAME: Case manager  Permission granted to share info w AGENCYHilda Lias dtr [phone]        Emotional Assessment Appearance:: Appears stated age Attitude/Demeanor/Rapport: Gracious Affect (typically observed): Accepting Orientation: : Oriented to Self Alcohol / Substance Use: Not Applicable Psych Involvement: No (comment)  Admission diagnosis:  Chronic atrial fibrillation (HCC) [I48.20] Acute on chronic diastolic (congestive) heart failure (HCC) [I50.33] Acute on chronic congestive heart failure, unspecified heart failure type [facility]) [I50.9] Patient Active Problem List   Diagnosis Date Noted  . SOB (shortness of breath) 02/05/2021  . CKD (chronic kidney disease), stage III (HCC) 02/05/2021  . Acute on chronic diastolic (congestive) heart failure (HCC) 02/04/2021  . Anxiety 12/24/2020  . Impacted cerumen of right ear 12/24/2020  . Acute on chronic diastolic CHF (congestive heart failure) (HCC) 11/26/2020  . Lower extremity edema 01/30/2020  . COVID-19 virus infection 11/21/2019  . Cough 11/21/2019  . Hyperlipidemia associated with type 2 diabetes mellitus (HCC) 10/12/2019  . External  hemorrhoid, bleeding 10/12/2019  . AF (paroxysmal atrial fibrillation) (HCC) 10/02/2019  . 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 (HCC)  s/p IR R MCA M2 09/30/2019  . Middle cerebral artery embolism, right 09/30/2019  . Contusion of foot including toes, right, initial encounter 08/10/2018  . Primary osteoarthritis of right knee 07/31/2018  . Right leg swelling 07/17/2018  . Pain of right calf 07/17/2018  . Preventative health care 09/18/2017  . Low back pain radiating to left leg 11/28/2016  . Hyperlipidemia 02/02/2015  . Sciatica 07/16/2014  . Knee pain, acute 07/16/2014  . Lower extremity pain, right 07/16/2014  . Skin infection 07/16/2014  . Incontinence of urine in female 06/19/2013  . Cerebral aneurysm rupture (HCC) 04/08/2013  . LEG EDEMA, RIGHT 07/06/2010  . OTHER DRUG ALLERGY 07/06/2010  . DIARRHEA 02/12/2010  . HEMATURIA, HX OF 11/24/2009  . ANEURYSM, HX OF 08/03/2009  . B12 deficiency 07/24/2009  . 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 SYNCOPE 07/20/2009  . DIZZINESS 07/20/2009  . Memory loss 07/20/2009  . 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 Hypothyroidism 12/14/2007  . Essential hypertension 12/14/2007  . DIVERTICULITIS, HX OF 12/14/2007  . GOITER 01/30/2007  . HYPERTENSION, BENIGN 01/30/2007  . UTERINE POLYP 01/30/2007  . THYROIDECTOMY, HX OF 01/30/2007   PCP:  [name], DO Pharmacy:   [facility] [phone] [address], [place] - [address] AT [address] [place] [address] Phone: [phone] Fax: [phone]     Social Determinants of Health (SDOH) Interventions    Readmission Risk Interventions No flowsheet data found.

## 2021-02-05 NOTE — Progress Notes (Signed)
Pt is increasingly  confused. Is not aware of her surroundings. States that she has to get up and used the [facility]  every 10 minutes. Bladder scan done to assess for urine retention, zero urine is detected.  It appears that the patient is forgetful, when the last time she has used the bathroom. Made charge RN and [facility] aware of the pt's high fall risk status. Page on call provider Blount NP, order placed for safety sitter, high fall risk protocol activated  at this time, floor matts also placed on fall. I will continue to monitor.

## 2021-02-05 NOTE — Progress Notes (Signed)
Initial Nutrition Assessment  DOCUMENTATION CODES:   Not applicable  INTERVENTION:  - will order 30 ml Prosource Plus BID, each supplement provides 100 kcal and 15 grams protein.  - will order Ensure Enlive once/day, each supplement provides 350 kcal and 20 grams of protein. - will order 1 tablet multivitamin with minerals/day. - complete NFPE when feasible.    NUTRITION DIAGNOSIS:   Inadequate oral intake related to decreased appetite as evidenced by per patient/family report.   GOAL:   Patient will meet greater than or equal to 90% of their needs  MONITOR:   PO intake,Supplement acceptance,Labs,Weight trends  REASON FOR ASSESSMENT:   Malnutrition Screening Tool    ASSESSMENT:   87 y.o. 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 She presented to the ED on 4/21 due to acute on chronic CHF, progressive SOB x1 week, worsening BLE edema, and new non-productive cough.  Patient ate 0% of breakfast and 25% of lunch today.   Patient care being done at the time of initial visit and then patient walking in the hall with tech and receiving care after ambulation. Unable to talk with patient at this time.   Information in the notes indicate that patient reported weighing herself nearly daily and that she has gain 8-9 lb in the past 1 week. She takes 60 mg oral lasix/day and daughter had reported that patient is very compliant with this.   Weight yesterday was documented as 140 lb and appears to be a stated weight. Weight on 11/05/20 was 154 lb and weight on 10/05/20 was 157 lb. Will monitor weight trends closely. If weight recording yesterday was accurate, this indicates 14 lb weight loss (9% body weight) in the past 4 months; significant for time frame.   Non-pitting edema to BLE documented in the edema section of flow sheet.   Per notes: - acute on chronic CHF -  persistent afib  - hx of stage 3 CKD    Labs reviewed; K: 3 mmol/l, creatinine: 1.62 mg/dl, GFR: 31 ml/min.  Medications reviewed; 40 mg IV lasix BID, 75 mcg oral synthroid/day, 40 mEq Klor-Con x2 doses 4/22.    NUTRITION - FOCUSED PHYSICAL EXAM:  unable to complete at this time.   Diet Order:   Diet Order            Diet Heart Room service appropriate? Yes; Fluid consistency: Thin  Diet effective now                 EDUCATION NEEDS:   Not appropriate for education at this time  Skin:  Skin Assessment: Reviewed RN Assessment  Last BM:  4/22  Height:   Ht Readings from Last 1 Encounters:  02/04/21 5' 7.5" (1.715 m)    Weight:   Wt Readings from Last 1 Encounters:  02/04/21 63.5 kg    Estimated Nutritional Needs:  Kcal:  1500-1700 kcal Protein:  75-85 grams Fluid:  >/= 1.2 L/day      Trenton Gammon, MS, RD, LDN, CNSC Inpatient Clinical Dietitian RD pager # available in [facility]  After hours/weekend pager # available in [facility]

## 2021-02-05 NOTE — Telephone Encounter (Signed)
Aware to call Monday and let us know (or send message via mychart) if cannot make appt d/t hospitalization. Aware they may also send a message via mychart if can't make it Monday. Dtr is agreeable to plan.

## 2021-02-05 NOTE — ED Notes (Signed)
Yong Channel, RN messaged. Vitals updated.

## 2021-02-05 NOTE — Telephone Encounter (Signed)
Patient's daughter states patient the is currently admitted and she is not sure if she should cancel her appointment on Monday. She states she is not sure how long she will be in the hospital, but she may be discharged by Sunday. She would like someone to look at what the patient is in the hospital for and let her know if she may be in the hospital for longer.

## 2021-02-05 NOTE — Progress Notes (Addendum)
PROGRESS NOTE    Belinda Anderson  FXT:[id number] DOB: 1932-08-21 DOA: 02/04/2021 PCP: [name], DO    Brief Narrative: This 87 years old female with PMH significant for dementia, chronic diastolic heart failure, persistent A. 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  Recent echocardiogram 06/21 shows LVEF 65 to 70%: No regional wall motion abnormalities.  She is found to have BNP of 306, troponin negative x 2.  EKG shows A. Fib,  rate controlled no ST-T wave changes. She is admitted for acute on chronic diastolic heart failure exacerbation.  Assessment & Plan:   Principal Problem:   Acute on chronic diastolic (congestive) heart failure (HCC) Active Problems:   Hypothyroidism   AF (paroxysmal atrial fibrillation) (HCC)   SOB (shortness of breath)   CKD (chronic kidney disease), stage III (HCC)  Acute on chronic diastolic heart failure: She presented with one week of progressive shortness of breath associated with orthopnea, worsening peripheral edema and 8 pound weight gain over the time. Recent echocardiogram 06/21 showed LVEF 65 to 70%, no regional wall motion abnormalities. Patient takes Lasix 60 mg p.o. daily. Continue Lasix 40 mg IV twice daily,  monitor intake output charting Daily weight.   Continue telemetry monitoring  Obtain repeat echocardiogram.  Persistent atrial fibrillation. Heart rate remains controlled, continue anticoagulation with Xarelto.   Continue Cardizem CD.  Hypothyroidism : continue Synthroid  CKD stage IIIb:  Baseline serum creatinine 1.6-1.8 Renal function remains at baseline.  UTI: UA consistent with UTI. Patient reports urinary discomfort, denies any burning. Start ceftriaxone 1 g for 3  days.  Depression: Continue Zoloft  Dementia: Patient is alert but oriented x 1 She does have intermittent agitation and restlessness. Start Haldol 1 mg every 8 hours as needed for agitation and restlessness.  DVT prophylaxis: Xarelto Code Status: DNR Family Communication:  No family at bed side. Disposition Plan:  Status is: Inpatient  Remains inpatient appropriate because:Inpatient level of care appropriate due to severity of illness   Dispo: The patient is from: Home              Anticipated d/c is to: TBD              Patient currently is not medically stable to d/c.   Difficult to place patient No   Consultants:    None  Procedures:  None Antimicrobials:  Anti-infectives (From admission, onward)   Start     Dose/Rate Route Frequency Ordered Stop   02/05/21 1530  cefTRIAXone (ROCEPHIN) 1 g in sodium chloride 0.9 % 100 mL IVPB        1 g 200 mL/hr over 30 Minutes Intravenous Every 24 hours 02/05/21 1443 02/08/21 1529      Subjective: Patient was seen and examined at bedside.  Overnight events noted.   Patient appears very anxious and restless, trying to get out of the bed, trying to remove IV line,  She states she wants to go home.  Objective: Vitals:   02/05/21 0000 02/05/21 0204 02/05/21 0605 02/05/21 1053  BP: 120/85 (!) 130/104 135/77 120/67  Pulse: 73 80 76 82  Resp: 18 20 (!) 22 20  Temp: 97.6 F (36.4 C) 97.8 F (36.6 C) 97.6 F (36.4 C) (!) 97.5 F (36.4 C)  TempSrc: Oral Oral Oral Oral  SpO2: 98% 96% 100% 96%  Weight:      Height:        Intake/Output Summary (Last 24 hours) at 02/05/2021 1444 Last data filed at 02/05/2021 1300 Gross per 24 hour  Intake 815.42 ml  Output 1500 ml  Net -684.58 ml   Filed Weights   02/04/21 2032 02/04/21 2041  Weight: 70 kg 63.5 kg    Examination:  General exam: Appears calm and comfortable , appears anxious and restless Respiratory system: Clear to auscultation. Respiratory effort  normal. Cardiovascular system: S1 & S2 heard, RRR. No JVD, murmurs, rubs, gallops or clicks. No pedal edema. Gastrointestinal system: Abdomen is nondistended, soft and nontender. No organomegaly or masses felt.  Normal bowel sounds heard. Central nervous system: Alert and oriented. No focal neurological deficits. Extremities: Symmetric 5 x 5 power.  1+ pedal edema noted. Skin: No rashes, lesions or ulcers Psychiatry: Judgement and insight appear normal. Mood & affect appropriate.     Data Reviewed: I have personally reviewed following labs and imaging studies  CBC: Recent Labs  Lab 02/04/21 2108 02/05/21 0445  WBC 9.5 7.8  NEUTROABS 7.2  --   HGB 11.7* 13.3  HCT 36.0 41.7  MCV 89.6 90.8  PLT 263 277   Basic Metabolic Panel: Recent Labs  Lab 02/04/21 2108 02/05/21 0000 02/05/21 0445  NA 141  --  145  K 3.8  --  3.0*  CL 111  --  108  CO2 18*  --  24  GLUCOSE 110*  --  109*  BUN 21  --  18  CREATININE 1.70*  --  1.62*  CALCIUM 9.5  --  9.3  MG  --  2.2 2.0  PHOS  --   --  3.3   GFR: Estimated Creatinine Clearance: 24.7 mL/min (A) (by C-G formula based on SCr of 1.62 mg/dL (H)). Liver Function Tests: Recent Labs  Lab 02/04/21 2108  AST 23  ALT 13  ALKPHOS 129*  BILITOT 0.5  PROT 6.6  ALBUMIN 3.7   Recent Labs  Lab 02/04/21 2108  LIPASE 40   No results for input(s): AMMONIA in the last 168 hours. Coagulation Profile: Recent Labs  Lab 02/04/21 2108  INR 1.6*   Cardiac Enzymes: No results for input(s): CKTOTAL, CKMB, CKMBINDEX, TROPONINI in the last 168 hours. BNP (last 3 results) Recent Labs    10/22/20 1219 11/05/20 1236 11/26/20 1335  PROBNP 1,896* 2,078* 411.0*   HbA1C: No results for input(s): HGBA1C in the last 72 hours. CBG: No results for input(s): GLUCAP in the last 168 hours. Lipid Profile: No results for input(s): CHOL, HDL, LDLCALC, TRIG, CHOLHDL, LDLDIRECT in the last 72 hours. Thyroid Function Tests: Recent Labs     02/05/21 0445  TSH 1.738   Anemia Panel: No results for input(s): VITAMINB12, FOLATE, FERRITIN, TIBC, IRON, RETICCTPCT in the last 72 hours. Sepsis Labs: Recent Labs  Lab 02/04/21 2108  LATICACIDVEN 1.5    Recent Results (from the past 240 hour(s))  Resp Panel by RT-PCR (Flu A&B, Covid) Nasopharyngeal Swab     Status: None   Collection Time: 02/04/21 11:36 PM   Specimen: Nasopharyngeal Swab; Nasopharyngeal(NP) swabs in vial transport medium  Result Value Ref Range Status   SARS Coronavirus 2 by RT PCR NEGATIVE NEGATIVE Final    Comment: (NOTE) SARS-CoV-2 target nucleic acids are NOT DETECTED.  The SARS-CoV-2 RNA is generally detectable in upper respiratory specimens during the acute phase of infection. The lowest concentration of SARS-CoV-2 viral copies this assay can detect is 138  copies/mL. A negative result does not preclude SARS-Cov-2 infection and should not be used as the sole basis for treatment or other patient management decisions. A negative result may occur with  improper specimen collection/handling, submission of specimen other than nasopharyngeal swab, presence of viral mutation(s) within the areas targeted by this assay, and inadequate number of viral copies(<138 copies/mL). A negative result must be combined with clinical observations, patient history, and epidemiological information. The expected result is Negative.  Fact Sheet for Patients:  BloggerCourse.com  Fact Sheet for Healthcare Providers:  SeriousBroker.it  This test is no t yet approved or cleared by the [place] FDA and  has been authorized for detection and/or diagnosis of SARS-CoV-2 by FDA under an Emergency Use Authorization (EUA). This EUA will remain  in effect (meaning this test can be used) for the duration of the COVID-19 declaration under Section 564(b)(1) of the Act, 21 U.S.C.section 360bbb-3(b)(1), unless the authorization is  terminated  or revoked sooner.       Influenza A by PCR NEGATIVE NEGATIVE Final   Influenza B by PCR NEGATIVE NEGATIVE Final    Comment: (NOTE) The Xpert Xpress SARS-CoV-2/FLU/RSV plus assay is intended as an aid in the diagnosis of influenza from Nasopharyngeal swab specimens and should not be used as a sole basis for treatment. Nasal washings and aspirates are unacceptable for Xpert Xpress SARS-CoV-2/FLU/RSV testing.  Fact Sheet for Patients: BloggerCourse.com  Fact Sheet for Healthcare Providers: SeriousBroker.it  This test is not yet approved or cleared by the [place] FDA and has been authorized for detection and/or diagnosis of SARS-CoV-2 by FDA under an Emergency Use Authorization (EUA). This EUA will remain in effect (meaning this test can be used) for the duration of the COVID-19 declaration under Section 564(b)(1) of the Act, 21 U.S.C. section 360bbb-3(b)(1), unless the authorization is terminated or revoked.  Performed at [facility], [address] [place], [place] [address]     Radiology Studies: DG Chest Port 1 View  Result Date: 02/04/2021 CLINICAL DATA:  Shortness of breath EXAM: PORTABLE CHEST 1 VIEW COMPARISON:  10/22/2020 FINDINGS: Cardiac shadow is at the upper limits of normal in size. Lungs are clear bilaterally. No bony abnormality is seen. IMPRESSION: No active disease. Electronically Signed   By: Alcide Clever M.D.   On: 02/04/2021 23:15    Scheduled Meds: . (feeding supplement) PROSource Plus  30 mL Oral BID BM  . diltiazem  180 mg Oral Daily  . feeding supplement  237 mL Oral Q24H  . furosemide  40 mg Intravenous BID  . levothyroxine  75 mcg Oral Q0600  . multivitamin with minerals  1 tablet Oral Daily  . Rivaroxaban  15 mg Oral Daily  . sertraline  25 mg Oral Daily   Continuous Infusions: . cefTRIAXone (ROCEPHIN)  IV       LOS: 1 day    Time spent: 35  mins    Yuri Flener, MD [facility]   If 7PM-7AM, please contact night-coverage

## 2021-02-06 DIAGNOSIS — I5033 Acute on chronic diastolic (congestive) heart failure: Secondary | ICD-10-CM | POA: Diagnosis not present

## 2021-02-06 LAB — BASIC METABOLIC PANEL
Anion gap: 11 (ref 5–15)
BUN: 24 mg/dL — ABNORMAL HIGH (ref 8–23)
CO2: 22 mmol/L (ref 22–32)
Calcium: 9 mg/dL (ref 8.9–10.3)
Chloride: 106 mmol/L (ref 98–111)
Creatinine, Ser: 1.83 mg/dL — ABNORMAL HIGH (ref 0.44–1.00)
GFR, Estimated: 27 mL/min — ABNORMAL LOW (ref >60)
Glucose, Bld: 94 mg/dL (ref 70–99)
Potassium: 3.6 mmol/L (ref 3.5–5.1)
Sodium: 139 mmol/L (ref 135–145)

## 2021-02-06 MED ORDER — LIP MEDEX EX OINT
1.0000 "application " | TOPICAL_OINTMENT | CUTANEOUS | Status: DC | PRN
  Administered 2021-02-07: 1 via TOPICAL
  Filled 2021-02-06: qty 7

## 2021-02-06 MED ORDER — HYDROCORTISONE (PERIANAL) 2.5 % EX CREA
TOPICAL_CREAM | Freq: Two times a day (BID) | CUTANEOUS | Status: DC
  Administered 2021-02-07: 1 via RECTAL
  Filled 2021-02-06: qty 28.35

## 2021-02-06 NOTE — Progress Notes (Signed)
PROGRESS NOTE    Belinda Anderson  DQQ:[id number] DOB: 1932-10-14 DOA: 02/04/2021 PCP: [name], DO    Brief Narrative: This 87 years old female with PMH significant for dementia, chronic diastolic heart failure, persistent A. 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  Recent echocardiogram 06/21 shows LVEF 65 to 70%: No regional wall motion abnormalities.  She is found to have BNP of 306, troponin negative x 2.  EKG shows A. Fib,  rate controlled no ST-T wave changes. She is admitted for acute on chronic diastolic heart failure exacerbation.  She is also found to have a UTI and started on IV ceftriaxone.  Patient was agitated and restless last night, required Haldol.  Assessment & Plan:   Principal Problem:   Acute on chronic diastolic (congestive) heart failure (HCC) Active Problems:   Hypothyroidism   AF (paroxysmal atrial fibrillation) (HCC)   SOB (shortness of breath)   CKD (chronic kidney disease), stage III (HCC)  Acute on chronic diastolic heart failure: She presented with one week of progressive shortness of breath associated with orthopnea, worsening peripheral edema and 8 pound weight gain over the time. Recent echocardiogram 06/21 showed LVEF 65 to 70%, no regional wall motion abnormalities. Patient takes Lasix 60 mg p.o. daily. Continue Lasix 40 mg IV twice daily, monitor intake output charting,  Daily weight.   She is down 2 pounds in weight 71.4 > 70.5 Continue telemetry monitoring  Repeat echocardiogram shows EF 55 to 60%.  Discussed with cardiologist Dr. Jens Som. He recommended to continue current management.  Persistent atrial fibrillation. Heart rate remains controlled,  Continue anticoagulation with Xarelto.   Continue Cardizem  CD.  Hypothyroidism :  Continue Synthroid  CKD stage IIIb:  Baseline serum creatinine 1.6-1.8 Renal function remains at baseline.  UTI: UA consistent with UTI. Patient reports urinary discomfort, denies any burning. Continue ceftriaxone for 3 days.  Depression: Continue Zoloft  Dementia: Patient is alert but oriented x 1 She does have intermittent agitation and restlessness. Continue Haldol 1 mg every 8 hours as needed for agitation and restlessness.  DVT prophylaxis: Xarelto Code Status: DNR Family Communication:  No family at bed side. Disposition Plan:  Status is: Inpatient  Remains inpatient appropriate because:Inpatient level of care appropriate due to severity of illness   Dispo: The patient is from: Home              Anticipated d/c is to:  SNF              Patient currently is not medically stable to d/c.   Difficult to place patient No   Consultants:    None  Procedures:  None Antimicrobials:  Anti-infectives (From admission, onward)   Start     Dose/Rate Route Frequency Ordered Stop   02/05/21 1600  cefTRIAXone (ROCEPHIN) 1 g in sodium chloride 0.9 % 100 mL IVPB        1 g 200 mL/hr over 30 Minutes Intravenous Daily 02/05/21 1443 02/08/21 1359      Subjective: Patient was seen and examined at bedside.  Overnight events noted.   Patient appears very anxious and restless, walked in the room, stating that she is going home. She is much calmer today, alert but oriented x 2 ( Name and person, not to location)  Objective: Vitals:   02/05/21 2201 02/06/21  0500 02/06/21 0636 02/06/21 1138  BP: 130/75  (!) 98/46 115/75  Pulse: 89  64   Resp: 18  18   Temp: 98 F (36.7 C)  (!) 97.4 F (36.3 C)   TempSrc:   Oral   SpO2: 98%  98%   Weight:  70.5 kg    Height:        Intake/Output Summary (Last 24 hours) at 02/06/2021 1150 Last data filed at 02/05/2021 1300 Gross per 24 hour  Intake 240 ml  Output --  Net 240 ml   Filed Weights   02/04/21  2041 02/05/21 0204 02/06/21 0500  Weight: 63.5 kg 71.4 kg 70.5 kg    Examination:  General exam: Appears calm and comfortable , appears anxious and restless Respiratory system: Clear to auscultation. Respiratory effort normal. Cardiovascular system: S1 & S2 heard, RRR. No JVD, murmurs, rubs, gallops or clicks. No pedal edema. Gastrointestinal system: Abdomen is nondistended, soft and nontender. No organomegaly or masses felt.  Normal bowel sounds heard. Central nervous system: Alert and oriented. No focal neurological deficits. Extremities: Symmetric 5 x 5 power.  1+ pedal edema noted. Skin: No rashes, lesions or ulcers Psychiatry: Judgement and insight appear normal. Mood & affect appropriate.     Data Reviewed: I have personally reviewed following labs and imaging studies  CBC: Recent Labs  Lab 02/04/21 2108 02/05/21 0445  WBC 9.5 7.8  NEUTROABS 7.2  --   HGB 11.7* 13.3  HCT 36.0 41.7  MCV 89.6 90.8  PLT 263 277   Basic Metabolic Panel: Recent Labs  Lab 02/04/21 2108 02/05/21 0000 02/05/21 0445 02/06/21 0428  NA 141  --  145 139  K 3.8  --  3.0* 3.6  CL 111  --  108 106  CO2 18*  --  24 22  GLUCOSE 110*  --  109* 94  BUN 21  --  18 24*  CREATININE 1.70*  --  1.62* 1.83*  CALCIUM 9.5  --  9.3 9.0  MG  --  2.2 2.0  --   PHOS  --   --  3.3  --    GFR: Estimated Creatinine Clearance: 21.5 mL/min (A) (by C-G formula based on SCr of 1.83 mg/dL (H)). Liver Function Tests: Recent Labs  Lab 02/04/21 2108  AST 23  ALT 13  ALKPHOS 129*  BILITOT 0.5  PROT 6.6  ALBUMIN 3.7   Recent Labs  Lab 02/04/21 2108  LIPASE 40   No results for input(s): AMMONIA in the last 168 hours. Coagulation Profile: Recent Labs  Lab 02/04/21 2108  INR 1.6*   Cardiac Enzymes: No results for input(s): CKTOTAL, CKMB, CKMBINDEX, TROPONINI in the last 168 hours. BNP (last 3 results) Recent Labs    10/22/20 1219 11/05/20 1236 11/26/20 1335  PROBNP 1,896* 2,078* 411.0*    HbA1C: No results for input(s): HGBA1C in the last 72 hours. CBG: No results for input(s): GLUCAP in the last 168 hours. Lipid Profile: No results for input(s): CHOL, HDL, LDLCALC, TRIG, CHOLHDL, LDLDIRECT in the last 72 hours. Thyroid Function Tests: Recent Labs    02/05/21 0445  TSH 1.738   Anemia Panel: No results for input(s): VITAMINB12, FOLATE, FERRITIN, TIBC, IRON, RETICCTPCT in the last 72 hours. Sepsis Labs: Recent Labs  Lab 02/04/21 2108  LATICACIDVEN 1.5    Recent Results (from the past 240 hour(s))  Resp Panel by RT-PCR (Flu A&B, Covid) Nasopharyngeal Swab     Status: None   Collection Time: 02/04/21 11:36 PM  Specimen: Nasopharyngeal Swab; Nasopharyngeal(NP) swabs in vial transport medium  Result Value Ref Range Status   SARS Coronavirus 2 by RT PCR NEGATIVE NEGATIVE Final    Comment: (NOTE) SARS-CoV-2 target nucleic acids are NOT DETECTED.  The SARS-CoV-2 RNA is generally detectable in upper respiratory specimens during the acute phase of infection. The lowest concentration of SARS-CoV-2 viral copies this assay can detect is 138 copies/mL. A negative result does not preclude SARS-Cov-2 infection and should not be used as the sole basis for treatment or other patient management decisions. A negative result may occur with  improper specimen collection/handling, submission of specimen other than nasopharyngeal swab, presence of viral mutation(s) within the areas targeted by this assay, and inadequate number of viral copies(<138 copies/mL). A negative result must be combined with clinical observations, patient history, and epidemiological information. The expected result is Negative.  Fact Sheet for Patients:  BloggerCourse.comhttps://www.fda.gov/media/152166/download  Fact Sheet for Healthcare Providers:  SeriousBroker.ithttps://www.fda.gov/media/152162/download  This test is no t yet approved or cleared by the [place] FDA and  has been authorized for detection and/or diagnosis  of SARS-CoV-2 by FDA under an Emergency Use Authorization (EUA). This EUA will remain  in effect (meaning this test can be used) for the duration of the COVID-19 declaration under Section 564(b)(1) of the Act, 21 U.S.C.section 360bbb-3(b)(1), unless the authorization is terminated  or revoked sooner.       Influenza A by PCR NEGATIVE NEGATIVE Final   Influenza B by PCR NEGATIVE NEGATIVE Final    Comment: (NOTE) The Xpert Xpress SARS-CoV-2/FLU/RSV plus assay is intended as an aid in the diagnosis of influenza from Nasopharyngeal swab specimens and should not be used as a sole basis for treatment. Nasal washings and aspirates are unacceptable for Xpert Xpress SARS-CoV-2/FLU/RSV testing.  Fact Sheet for Patients: BloggerCourse.comhttps://www.fda.gov/media/152166/download  Fact Sheet for Healthcare Providers: SeriousBroker.ithttps://www.fda.gov/media/152162/download  This test is not yet approved or cleared by the [place] FDA and has been authorized for detection and/or diagnosis of SARS-CoV-2 by FDA under an Emergency Use Authorization (EUA). This EUA will remain in effect (meaning this test can be used) for the duration of the COVID-19 declaration under Section 564(b)(1) of the Act, 21 U.S.C. section 360bbb-3(b)(1), unless the authorization is terminated or revoked.  [facility], [address] [place], [place] [address]     Radiology Studies: DG Chest Port 1 View  Result Date: 02/04/2021 CLINICAL DATA:  Shortness of breath EXAM: PORTABLE CHEST 1 VIEW COMPARISON:  10/22/2020 FINDINGS: Cardiac shadow is at the upper limits of normal in size. Lungs are clear bilaterally. No bony abnormality is seen. IMPRESSION: No active disease. Electronically Signed   By: Alcide CleverMark  Lukens M.D.   On: 02/04/2021 23:15   ECHOCARDIOGRAM COMPLETE  Result Date: 02/05/2021    ECHOCARDIOGRAM REPORT   Patient Name:   Belinda Anderson Date of Exam: 02/05/2021 Medical Rec #:  [id number]       Height:        67.5 in Accession #:    [id number]      Weight:       140.0 lb Date of Birth:  30-Nov-1931       BSA:          1.747 m Patient Age:    88 years        BP:           120/67 mmHg Patient Gender: F  HR:           86 bpm. Exam Location:  Inpatient Procedure: 2D Echo, Cardiac Doppler and Color Doppler Indications:    I50.23 Acute on chronic systolic (congestive) heart failure  History:        Patient has prior history of Echocardiogram examinations, most                 recent 04/15/2020. TIA, Arrythmias:Atrial Fibrillation; Risk                 Factors:Hypertension. Thyroid Disease.  Sonographer:    Tiffany Dance Referring Phys: [id number] Angie Fava IMPRESSIONS  1. Left ventricular systolic function is normal. EF 55-60%. No regional wall motion abnormalities. There is mild focal hypertrophy of the basilar septum. Diastolic function is indeterminate due to atrial fibrillation. Findings suggestive of restrictive cardiomyopathy.  2. Right ventricular systolic function is mildly reduced. The right ventricular size is moderately enlarged. There is normal pulmonary artery systolic pressure.  3. Left atrial size was severely dilated.  4. Right atrial size was severely dilated.  5. The mitral valve is normal in structure. Mild mitral valve regurgitation. No evidence of mitral stenosis.  6. Tricuspid valve regurgitation is severe.  7. The aortic valve is tricuspid. There is moderate calcification of the aortic valve. Aortic valve regurgitation is not visualized. No aortic stenosis is present.  8. The inferior vena cava is normal in size with greater than 50% respiratory variability, suggesting right atrial pressure of 3 mmHg. FINDINGS  Left Ventricle: Left ventricular ejection fraction, by estimation, is 55 to 60%. The left ventricle has normal function. Left ventricular endocardial border not optimally defined to evaluate regional wall motion. The left ventricular internal cavity size was normal in size.  There is no left ventricular hypertrophy of the basal-septal segment. Left ventricular diastolic function could not be evaluated due to atrial fibrillation. Left ventricular diastolic function could not be evaluated. Right Ventricle: The right ventricular size is moderately enlarged. No increase in right ventricular wall thickness. Right ventricular systolic function is mildly reduced. There is normal pulmonary artery systolic pressure. The tricuspid regurgitant velocity is 2.56 m/s, and with an assumed right atrial pressure of 8 mmHg, the estimated right ventricular systolic pressure is 34.2 mmHg. Left Atrium: Left atrial size was severely dilated. Right Atrium: Right atrial size was severely dilated. Pericardium: There is no evidence of pericardial effusion. Mitral Valve: The mitral valve is normal in structure. Mild mitral annular calcification. Mild mitral valve regurgitation. No evidence of mitral valve stenosis. Tricuspid Valve: The tricuspid valve is normal in structure. Tricuspid valve regurgitation is severe. No evidence of tricuspid stenosis. Aortic Valve: The aortic valve is tricuspid. There is moderate calcification of the aortic valve. Aortic valve regurgitation is not visualized. No aortic stenosis is present. Aortic valve mean gradient measures 4.0 mmHg. Aortic valve peak gradient measures 7.6 mmHg. Aortic valve area, by VTI measures 1.49 cm. Pulmonic Valve: The pulmonic valve was grossly normal. Pulmonic valve regurgitation is trivial. No evidence of pulmonic stenosis. Aorta: The aortic root is normal in size and structure. Venous: The inferior vena cava is normal in size with greater than 50% respiratory variability, suggesting right atrial pressure of 3 mmHg. IAS/Shunts: No atrial level shunt detected by color flow Doppler.  LEFT VENTRICLE PLAX 2D LVIDd:         4.00 cm LVIDs:         2.60 cm LV PW:         1.20  cm LV IVS:        1.00 cm LVOT diam:     2.00 cm LV SV:         39 LV SV Index:   23  LVOT Area:     3.14 cm  RIGHT VENTRICLE            IVC RV Basal diam:  3.80 cm    IVC diam: 2.50 cm RV Mid diam:    2.10 cm RV S prime:     9.68 cm/s TAPSE (M-mode): 1.3 cm LEFT ATRIUM              Index       RIGHT ATRIUM           Index LA diam:        5.30 cm  3.03 cm/m  RA Area:     18.90 cm LA Vol (A2C):   101.0 ml 57.80 ml/m RA Volume:   53.30 ml  30.50 ml/m LA Vol (A4C):   93.0 ml  53.22 ml/m LA Biplane Vol: 100.0 ml 57.23 ml/m  AORTIC VALVE AV Area (Vmax):    1.35 cm AV Area (Vmean):   1.47 cm AV Area (VTI):     1.49 cm AV Vmax:           138.00 cm/s AV Vmean:          91.000 cm/s AV VTI:            0.264 m AV Peak Grad:      7.6 mmHg AV Mean Grad:      4.0 mmHg LVOT Vmax:         59.25 cm/s LVOT Vmean:        42.650 cm/s LVOT VTI:          0.126 m LVOT/AV VTI ratio: 0.48  AORTA Ao Root diam: 3.60 cm Ao Asc diam:  3.70 cm MITRAL VALVE                TRICUSPID VALVE MV Area (PHT): 3.72 cm     TR Peak grad:   26.2 mmHg MV Decel Time: 204 msec     TR Vmax:        256.00 cm/s MV E velocity: 110.00 cm/s                             SHUNTS                             Systemic VTI:  0.13 m                             Systemic Diam: 2.00 cm Arvilla Meres MD Electronically signed by Arvilla Meres MD Signature Date/Time: 02/05/2021/5:02:34 PM    Final     Scheduled Meds: . (feeding supplement) PROSource Plus  30 mL Oral BID BM  . diltiazem  180 mg Oral Daily  . feeding supplement  237 mL Oral Q24H  . furosemide  40 mg Intravenous BID  . levothyroxine  75 mcg Oral Q0600  . multivitamin with minerals  1 tablet Oral Daily  . Rivaroxaban  15 mg Oral Daily  . sertraline  25 mg Oral Daily   Continuous Infusions: . cefTRIAXone (ROCEPHIN)  IV 1 g (02/05/21 1656)     LOS: 2 days  Time spent: 25 mins    Shawna Clamp, MD [facility]   If 7PM-7AM, please contact night-coverage

## 2021-02-06 NOTE — Evaluation (Signed)
Physical Therapy Evaluation Patient Details Name: Belinda Anderson MRN: [id number] DOB: Apr 29, 1932 Today's Date: 02/06/2021   History of Present Illness  87 years old female presented in the ED with progressively worsening shortness of breath associated with orthopnea, bilateral lower extremity edema. Dx of CHF exacerbation. Pt with PMH significant for dementia, chronic diastolic heart failure, persistent A. 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  Clinical Impression  Pt admitted with above diagnosis. Pt ambulated 170' with hand held assist of 2. She ambulates with a cane at baseline. Pt's family is unable to provide 24* assistance, as pt has memory deficits SNF is recommended.  Pt currently with functional limitations due to the deficits listed below (see PT Problem List). Pt will benefit from skilled PT to increase their independence and safety with mobility to allow discharge to the venue listed below.       Follow Up Recommendations Supervision for mobility/OOB; SNF    Equipment Recommendations  None recommended by PT    Recommendations for Other Services       Precautions / Restrictions Precautions Precautions: Fall Restrictions Weight Bearing Restrictions: No      Mobility  Bed Mobility               General bed mobility comments: sitting at edge of bed at start of session    Transfers Overall transfer level: Needs assistance Equipment used: 1 person hand held assist Transfers: Sit to/from Stand Sit to Stand: Min guard         General transfer comment: VCs hand placement  Ambulation/Gait Ambulation/Gait assistance: Min guard Gait Distance (Feet): 170 Feet Assistive device: 2 person hand held assist Gait Pattern/deviations: Decreased  weight shift to left;Trendelenburg Gait velocity: WFL   General Gait Details: 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, no loss of balance, used HHA of 2 for safety but likely only needs +1 assist  Stairs            Wheelchair Mobility    Modified Rankin (Stroke Patients Only)       Balance Overall balance assessment: Needs assistance Sitting-balance support: Feet supported;No upper extremity supported Sitting balance-Leahy Scale: Good       Standing balance-Leahy Scale: Fair                               Pertinent Vitals/Pain Pain Assessment: No/denies pain    Home Living Family/patient expects to be discharged to:: Private residence Living Arrangements: Alone Available Help at Discharge: Family;Available PRN/intermittently Type of Home: House Home Access: Stairs to enter   Entrance Stairs-Number of Steps: small threshold Home Layout: One level Home Equipment: Walker - 4 wheels;Bedside commode;Cane - single point;Grab bars - tub/shower      Prior Function           Comments: walks with SPC at home, daughter and pt's siblings come by daily but not available 24/7     Hand Dominance        Extremity/Trunk Assessment   Upper Extremity Assessment Upper Extremity Assessment: Overall WFL for tasks assessed    Lower Extremity Assessment Lower Extremity Assessment: Overall WFL for tasks assessed    Cervical / Trunk Assessment Cervical / Trunk Assessment: Normal  Communication   Communication: No difficulties  Cognition Arousal/Alertness: Awake/alert  Behavior During Therapy: WFL for tasks assessed/performed Overall Cognitive Status: History of cognitive impairments - at baseline                                 General Comments: oriented to self and location, not to situation, pleasant and cooperative, short term memory deficits      General Comments      Exercises     Assessment/Plan    PT Assessment Patient needs  continued PT services  PT Problem List Decreased activity tolerance;Decreased balance       PT Treatment Interventions Therapeutic activities;Therapeutic exercise;Gait training;Balance training    PT Goals (Current goals can be found in the Care Plan section)  Acute Rehab PT Goals Patient Stated Goal: go home PT Goal Formulation: With family Time For Goal Achievement: 02/20/21 Potential to Achieve Goals: Good    Frequency Min 2X/week   Barriers to discharge        Co-evaluation               AM-PAC PT "6 Clicks" Mobility  Outcome Measure Help needed turning from your back to your side while in a flat bed without using bedrails?: A Little Help needed moving from lying on your back to sitting on the side of a flat bed without using bedrails?: A Little Help needed moving to and from a bed to a chair (including a wheelchair)?: A Little Help needed standing up from a chair using your arms (e.g., wheelchair or bedside chair)?: A Little Help needed to walk in hospital room?: A Little Help needed climbing 3-5 steps with a railing? : A Little 6 Click Score: 18    End of Session Equipment Utilized During Treatment: Gait belt Activity Tolerance: Patient tolerated treatment well Patient left: in chair;with call bell/phone within reach;with chair alarm set Nurse Communication: Mobility status PT Visit Diagnosis: Unsteadiness on feet (R26.81);Difficulty in walking, not elsewhere classified (R26.2)    Time: 1045-1101 PT Time Calculation (min) (ACUTE ONLY): 16 min   Charges:   PT Evaluation $PT Eval Moderate Complexity: 1 Mod         Tamala Ser PT 02/06/2021  Acute Rehabilitation Services Pager [phone] Office [phone]

## 2021-02-06 NOTE — Plan of Care (Signed)
  Problem: Education: Goal: Ability to demonstrate management of disease process will improve Outcome: Not Progressing   Problem: Activity: Goal: Capacity to carry out activities will improve Outcome: Not Progressing   Problem: Activity: Goal: Risk for activity intolerance will decrease Outcome: Not Progressing   Problem: Safety: Goal: Ability to remain free from injury will improve Outcome: Not Progressing

## 2021-02-07 DIAGNOSIS — I5033 Acute on chronic diastolic (congestive) heart failure: Secondary | ICD-10-CM | POA: Diagnosis not present

## 2021-02-07 LAB — BASIC METABOLIC PANEL
Anion gap: 13 (ref 5–15)
BUN: 28 mg/dL — ABNORMAL HIGH (ref 8–23)
CO2: 22 mmol/L (ref 22–32)
Calcium: 8.6 mg/dL — ABNORMAL LOW (ref 8.9–10.3)
Chloride: 103 mmol/L (ref 98–111)
Creatinine, Ser: 1.5 mg/dL — ABNORMAL HIGH (ref 0.44–1.00)
GFR, Estimated: 34 mL/min — ABNORMAL LOW (ref >60)
Glucose, Bld: 85 mg/dL (ref 70–99)
Potassium: 3.2 mmol/L — ABNORMAL LOW (ref 3.5–5.1)
Sodium: 138 mmol/L (ref 135–145)

## 2021-02-07 MED ORDER — POTASSIUM CHLORIDE 20 MEQ PO PACK
40.0000 meq | PACK | Freq: Once | ORAL | Status: AC
  Administered 2021-02-07: 40 meq via ORAL
  Filled 2021-02-07: qty 2

## 2021-02-07 NOTE — Progress Notes (Signed)
Received patient as transfer from ICU.  Patient oriented to unit and equipment.  Telemetry placed on patient and verified with CMT.  Patient is stable and resting at this time. Will cont to monitor.

## 2021-02-07 NOTE — Progress Notes (Signed)
PROGRESS NOTE    Belinda Anderson  QIW:[id number] DOB: [birth year] DOA: 02/04/2021 PCP: [name], DO    Brief Narrative: This 87 years old female with PMH significant for dementia, chronic diastolic heart failure, persistent A. 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  Recent echocardiogram 06/21 shows LVEF 65 to 70%: No regional wall motion abnormalities.  She is found to have BNP of 306, troponin negative x 2.  EKG shows A. Fib,  rate controlled no ST-T wave changes. She is admitted for acute on chronic diastolic heart failure exacerbation.  She is also found to have a UTI and started on IV ceftriaxone.  Patient was agitated and restless last night, required Haldol.  PT : SNF pending insurance authorization.  Assessment & Plan:   Principal Problem:   Acute on chronic diastolic (congestive) heart failure (HCC) Active Problems:   Hypothyroidism   AF (paroxysmal atrial fibrillation) (HCC)   SOB (shortness of breath)   CKD (chronic kidney disease), stage III (HCC)  Acute on chronic diastolic heart failure: She presented with one week hx.of progressive shortness of breath associated with orthopnea, worsening peripheral edema and 8 pound weight gain over this time. Recent echocardiogram 06/21 showed LVEF 65 to 70%, no regional wall motion abnormalities. Patient takes Lasix 60 mg p.o. daily at home Continue Lasix 40 mg IV twice daily, monitor intake/output charting,  Daily weight.   She is down 2 pounds in weight 71.4 > 70.5 Continue telemetry monitoring  Repeat echocardiogram shows EF 55 to 60%.  Discussed with cardiologist Dr. Jens Som. He recommended to continue current management and outpatient follow up.  Persistent atrial fibrillation. Heart rate  remains controlled,  Continue anticoagulation with Xarelto.   Continue Cardizem CD.  Hypothyroidism :  Continue Synthroid  CKD stage IIIb:  Baseline serum creatinine 1.6-1.8 Renal function remains at baseline.  UTI: UA consistent with UTI. Patient reports urinary discomfort, denies any burning. Continue ceftriaxone for 3 days.  Depression: Continue Zoloft  Dementia: Patient is alert but oriented x 1 She does have intermittent agitation and restlessness. Continue Haldol 1 mg every 8 hours as needed for agitation and restlessness.  DVT prophylaxis: Xarelto Code Status: DNR Family Communication:  Spoke with daughter over the phone. Disposition Plan:  Status is: Inpatient  Remains inpatient appropriate because:Inpatient level of care appropriate due to severity of illness   Dispo: The patient is from: Home              Anticipated d/c is to:  SNF in 1-2 days.               Patient currently  Is medically clear    Difficult to place patient No   Consultants:    None  Procedures:  None Antimicrobials:  Anti-infectives (From admission, onward)   Start     Dose/Rate Route Frequency Ordered Stop   02/05/21 1600  cefTRIAXone (ROCEPHIN) 1 g in sodium chloride 0.9 % 100 mL IVPB        1 g 200 mL/hr over 30 Minutes Intravenous Daily 02/05/21 1443 02/08/21 1359      Subjective: Patient was seen and examined at bedside.  Overnight events noted.   Patient appears very much calm and cooperative today, slept well last night. She has improved , her mentation has improved.  Objective: Vitals:  02/07/21 0520 02/07/21 1024 02/07/21 1026 02/07/21 1323  BP: 114/64 111/68 111/68 104/62  Pulse: 77  74 74  Resp: 16  16 16   Temp: 98.1 F (36.7 C)  97.7 F (36.5 C) 98.3 F (36.8 C)  TempSrc: Oral  Oral Oral  SpO2: 98%  96% 97%  Weight: 71.2 kg     Height:        Intake/Output Summary (Last 24 hours) at 02/07/2021 1345 Last data filed at 02/07/2021 1112 Gross per 24 hour   Intake 360 ml  Output 700 ml  Net -340 ml   Filed Weights   02/05/21 0204 02/06/21 0500 02/07/21 0520  Weight: 71.4 kg 70.5 kg 71.2 kg    Examination:  General exam: Appears much calmer and comfortable. Respiratory system: Clear to auscultation. Respiratory effort normal. Cardiovascular system: S1 & S2 heard, RRR. No JVD, murmurs, rubs, gallops or clicks. No pedal edema. Gastrointestinal system: Abdomen is nondistended, soft and nontender. No organomegaly or masses felt.  Normal bowel sounds heard. Central nervous system: Alert and oriented. No focal neurological deficits. Extremities: Symmetric 5 x 5 power.  No edema, no cyanosis, no clubbing. Skin: No rashes, lesions or ulcers Psychiatry: Judgement and insight appear normal. Mood & affect appropriate.     Data Reviewed: I have personally reviewed following labs and imaging studies  CBC: Recent Labs  Lab 02/04/21 2108 02/05/21 0445  WBC 9.5 7.8  NEUTROABS 7.2  --   HGB 11.7* 13.3  HCT 36.0 41.7  MCV 89.6 90.8  PLT 263 277   Basic Metabolic Panel: Recent Labs  Lab 02/04/21 2108 02/05/21 0000 02/05/21 0445 02/06/21 0428 02/07/21 0638  NA 141  --  145 139 138  K 3.8  --  3.0* 3.6 3.2*  CL 111  --  108 106 103  CO2 18*  --  24 22 22   GLUCOSE 110*  --  109* 94 85  BUN 21  --  18 24* 28*  CREATININE 1.70*  --  1.62* 1.83* 1.50*  CALCIUM 9.5  --  9.3 9.0 8.6*  MG  --  2.2 2.0  --   --   PHOS  --   --  3.3  --   --    GFR: Estimated Creatinine Clearance: 26.2 mL/min (A) (by C-G formula based on SCr of 1.5 mg/dL (H)). Liver Function Tests: Recent Labs  Lab 02/04/21 2108  AST 23  ALT 13  ALKPHOS 129*  BILITOT 0.5  PROT 6.6  ALBUMIN 3.7   Recent Labs  Lab 02/04/21 2108  LIPASE 40   No results for input(s): AMMONIA in the last 168 hours. Coagulation Profile: Recent Labs  Lab 02/04/21 2108  INR 1.6*   Cardiac Enzymes: No results for input(s): CKTOTAL, CKMB, CKMBINDEX, TROPONINI in the last 168  hours. BNP (last 3 results) Recent Labs    10/22/20 1219 11/05/20 1236 11/26/20 1335  PROBNP 1,896* 2,078* 411.0*   HbA1C: No results for input(s): HGBA1C in the last 72 hours. CBG: No results for input(s): GLUCAP in the last 168 hours. Lipid Profile: No results for input(s): CHOL, HDL, LDLCALC, TRIG, CHOLHDL, LDLDIRECT in the last 72 hours. Thyroid Function Tests: Recent Labs    02/05/21 0445  TSH 1.738   Anemia Panel: No results for input(s): VITAMINB12, FOLATE, FERRITIN, TIBC, IRON, RETICCTPCT in the last 72 hours. Sepsis Labs: Recent Labs  Lab 02/04/21 2108  LATICACIDVEN 1.5    Recent Results (from the past 240 hour(s))  Resp Panel by  RT-PCR (Flu A&B, Covid) Nasopharyngeal Swab     Status: None   Collection Time: 02/04/21 11:36 PM   Specimen: Nasopharyngeal Swab; Nasopharyngeal(NP) swabs in vial transport medium  Result Value Ref Range Status   SARS Coronavirus 2 by RT PCR NEGATIVE NEGATIVE Final    Comment: (NOTE) SARS-CoV-2 target nucleic acids are NOT DETECTED.  The SARS-CoV-2 RNA is generally detectable in upper respiratory specimens during the acute phase of infection. The lowest concentration of SARS-CoV-2 viral copies this assay can detect is 138 copies/mL. A negative result does not preclude SARS-Cov-2 infection and should not be used as the sole basis for treatment or other patient management decisions. A negative result may occur with  improper specimen collection/handling, submission of specimen other than nasopharyngeal swab, presence of viral mutation(s) within the areas targeted by this assay, and inadequate number of viral copies(<138 copies/mL). A negative result must be combined with clinical observations, patient history, and epidemiological information. The expected result is Negative.  Fact Sheet for Patients:  BloggerCourse.com  Fact Sheet for Healthcare Providers:   SeriousBroker.it  This test is no t yet approved or cleared by the [place] FDA and  has been authorized for detection and/or diagnosis of SARS-CoV-2 by FDA under an Emergency Use Authorization (EUA). This EUA will remain  in effect (meaning this test can be used) for the duration of the COVID-19 declaration under Section 564(b)(1) of the Act, 21 U.S.C.section 360bbb-3(b)(1), unless the authorization is terminated  or revoked sooner.       Influenza A by PCR NEGATIVE NEGATIVE Final   Influenza B by PCR NEGATIVE NEGATIVE Final    Comment: (NOTE) The Xpert Xpress SARS-CoV-2/FLU/RSV plus assay is intended as an aid in the diagnosis of influenza from Nasopharyngeal swab specimens and should not be used as a sole basis for treatment. Nasal washings and aspirates are unacceptable for Xpert Xpress SARS-CoV-2/FLU/RSV testing.  Fact Sheet for Patients: BloggerCourse.com  Fact Sheet for Healthcare Providers: SeriousBroker.it  This test is not yet approved or cleared by the [place] FDA and has been authorized for detection and/or diagnosis of SARS-CoV-2 by FDA under an Emergency Use Authorization (EUA). This EUA will remain in effect (meaning this test can be used) for the duration of the COVID-19 declaration under Section 564(b)(1) of the Act, 21 U.S.C. section 360bbb-3(b)(1), unless the authorization is terminated or revoked.  Performed at [facility], [address], [place], [place] [address]     Radiology Studies: ECHOCARDIOGRAM COMPLETE  Result Date: 02/05/2021    ECHOCARDIOGRAM REPORT   Patient Name:   KELLER MIKELS Date of Exam: 02/05/2021 Medical Rec #:  [id number]       Height:       67.5 in Accession #:    [id number]      Weight:       140.0 lb Date of Birth:  06-22-1932       BSA:          1.747 m Patient Age:    88 years        BP:           120/67 mmHg Patient  Gender: F               HR:           86 bpm. Exam Location:  Inpatient Procedure: 2D Echo, Cardiac Doppler and Color Doppler Indications:    I50.23 Acute on chronic systolic (congestive) heart failure  History:  Patient has prior history of Echocardiogram examinations, most                 recent 04/15/2020. TIA, Arrythmias:Atrial Fibrillation; Risk                 Factors:Hypertension. Thyroid Disease.  Sonographer:    Tiffany Dance Referring Phys: [id number] Angie Fava IMPRESSIONS  1. Left ventricular systolic function is normal. EF 55-60%. No regional wall motion abnormalities. There is mild focal hypertrophy of the basilar septum. Diastolic function is indeterminate due to atrial fibrillation. Findings suggestive of restrictive cardiomyopathy.  2. Right ventricular systolic function is mildly reduced. The right ventricular size is moderately enlarged. There is normal pulmonary artery systolic pressure.  3. Left atrial size was severely dilated.  4. Right atrial size was severely dilated.  5. The mitral valve is normal in structure. Mild mitral valve regurgitation. No evidence of mitral stenosis.  6. Tricuspid valve regurgitation is severe.  7. The aortic valve is tricuspid. There is moderate calcification of the aortic valve. Aortic valve regurgitation is not visualized. No aortic stenosis is present.  8. The inferior vena cava is normal in size with greater than 50% respiratory variability, suggesting right atrial pressure of 3 mmHg. FINDINGS  Left Ventricle: Left ventricular ejection fraction, by estimation, is 55 to 60%. The left ventricle has normal function. Left ventricular endocardial border not optimally defined to evaluate regional wall motion. The left ventricular internal cavity size was normal in size. There is no left ventricular hypertrophy of the basal-septal segment. Left ventricular diastolic function could not be evaluated due to atrial fibrillation. Left ventricular diastolic  function could not be evaluated. Right Ventricle: The right ventricular size is moderately enlarged. No increase in right ventricular wall thickness. Right ventricular systolic function is mildly reduced. There is normal pulmonary artery systolic pressure. The tricuspid regurgitant velocity is 2.56 m/s, and with an assumed right atrial pressure of 8 mmHg, the estimated right ventricular systolic pressure is 34.2 mmHg. Left Atrium: Left atrial size was severely dilated. Right Atrium: Right atrial size was severely dilated. Pericardium: There is no evidence of pericardial effusion. Mitral Valve: The mitral valve is normal in structure. Mild mitral annular calcification. Mild mitral valve regurgitation. No evidence of mitral valve stenosis. Tricuspid Valve: The tricuspid valve is normal in structure. Tricuspid valve regurgitation is severe. No evidence of tricuspid stenosis. Aortic Valve: The aortic valve is tricuspid. There is moderate calcification of the aortic valve. Aortic valve regurgitation is not visualized. No aortic stenosis is present. Aortic valve mean gradient measures 4.0 mmHg. Aortic valve peak gradient measures 7.6 mmHg. Aortic valve area, by VTI measures 1.49 cm. Pulmonic Valve: The pulmonic valve was grossly normal. Pulmonic valve regurgitation is trivial. No evidence of pulmonic stenosis. Aorta: The aortic root is normal in size and structure. Venous: The inferior vena cava is normal in size with greater than 50% respiratory variability, suggesting right atrial pressure of 3 mmHg. IAS/Shunts: No atrial level shunt detected by color flow Doppler.  LEFT VENTRICLE PLAX 2D LVIDd:         4.00 cm LVIDs:         2.60 cm LV PW:         1.20 cm LV IVS:        1.00 cm LVOT diam:     2.00 cm LV SV:         39 LV SV Index:   23 LVOT Area:     3.14 cm  RIGHT VENTRICLE            IVC RV Basal diam:  3.80 cm    IVC diam: 2.50 cm RV Mid diam:    2.10 cm RV S prime:     9.68 cm/s TAPSE (M-mode): 1.3 cm LEFT ATRIUM               Index       RIGHT ATRIUM           Index LA diam:        5.30 cm  3.03 cm/m  RA Area:     18.90 cm LA Vol (A2C):   101.0 ml 57.80 ml/m RA Volume:   53.30 ml  30.50 ml/m LA Vol (A4C):   93.0 ml  53.22 ml/m LA Biplane Vol: 100.0 ml 57.23 ml/m  AORTIC VALVE AV Area (Vmax):    1.35 cm AV Area (Vmean):   1.47 cm AV Area (VTI):     1.49 cm AV Vmax:           138.00 cm/s AV Vmean:          91.000 cm/s AV VTI:            0.264 m AV Peak Grad:      7.6 mmHg AV Mean Grad:      4.0 mmHg LVOT Vmax:         59.25 cm/s LVOT Vmean:        42.650 cm/s LVOT VTI:          0.126 m LVOT/AV VTI ratio: 0.48  AORTA Ao Root diam: 3.60 cm Ao Asc diam:  3.70 cm MITRAL VALVE                TRICUSPID VALVE MV Area (PHT): 3.72 cm     TR Peak grad:   26.2 mmHg MV Decel Time: 204 msec     TR Vmax:        256.00 cm/s MV E velocity: 110.00 cm/s                             SHUNTS                             Systemic VTI:  0.13 m                             Systemic Diam: 2.00 cm Arvilla Meresaniel Bensimhon MD Electronically signed by Arvilla Meresaniel Bensimhon MD Signature Date/Time: 02/05/2021/5:02:34 PM    Final     Scheduled Meds: . (feeding supplement) PROSource Plus  30 mL Oral BID BM  . diltiazem  180 mg Oral Daily  . feeding supplement  237 mL Oral Q24H  . furosemide  40 mg Intravenous BID  . hydrocortisone   Rectal BID  . levothyroxine  75 mcg Oral Q0600  . multivitamin with minerals  1 tablet Oral Daily  . Rivaroxaban  15 mg Oral Daily  . sertraline  25 mg Oral Daily   Continuous Infusions: . cefTRIAXone (ROCEPHIN)  IV 1 g (02/06/21 1533)     LOS: 3 days    Time spent: 25 mins    Kerianne Gurr, MD [facility]   If 7PM-7AM, please contact night-coverage

## 2021-02-08 ENCOUNTER — Ambulatory Visit: Payer: Medicare Other | Admitting: Cardiology

## 2021-02-08 DIAGNOSIS — I5033 Acute on chronic diastolic (congestive) heart failure: Secondary | ICD-10-CM

## 2021-02-08 LAB — BASIC METABOLIC PANEL
Anion gap: 12 (ref 5–15)
BUN: 35 mg/dL — ABNORMAL HIGH (ref 8–23)
CO2: 23 mmol/L (ref 22–32)
Calcium: 8.7 mg/dL — ABNORMAL LOW (ref 8.9–10.3)
Chloride: 104 mmol/L (ref 98–111)
Creatinine, Ser: 1.45 mg/dL — ABNORMAL HIGH (ref 0.44–1.00)
GFR, Estimated: 35 mL/min — ABNORMAL LOW (ref >60)
Glucose, Bld: 107 mg/dL — ABNORMAL HIGH (ref 70–99)
Potassium: 3.2 mmol/L — ABNORMAL LOW (ref 3.5–5.1)
Sodium: 139 mmol/L (ref 135–145)

## 2021-02-08 MED ORDER — POTASSIUM CHLORIDE 20 MEQ PO PACK
40.0000 meq | PACK | Freq: Two times a day (BID) | ORAL | Status: DC
  Administered 2021-02-08: 40 meq via ORAL
  Filled 2021-02-08: qty 2

## 2021-02-08 MED ORDER — FUROSEMIDE 20 MG PO TABS: 60.0000 mg | ORAL_TABLET | Freq: Every day | ORAL | Status: DC

## 2021-02-08 MED ORDER — FUROSEMIDE 40 MG PO TABS: 60.0000 mg | ORAL_TABLET | Freq: Every day | ORAL | Status: DC

## 2021-02-08 MED ORDER — HALOPERIDOL 1 MG PO TABS: 1.0000 mg | ORAL_TABLET | Freq: Three times a day (TID) | ORAL | 0 refills | Status: DC | PRN

## 2021-02-08 NOTE — Plan of Care (Signed)
  Problem: Activity: Goal: Capacity to carry out activities will improve Outcome: Adequate for Discharge   Problem: Nutrition: Goal: Adequate nutrition will be maintained Outcome: Adequate for Discharge   Problem: Elimination: Goal: Will not experience complications related to bowel motility Outcome: Adequate for Discharge   Problem: Safety: Goal: Ability to remain free from injury will improve Outcome: Adequate for Discharge   Problem: Skin Integrity: Goal: Risk for impaired skin integrity will decrease Outcome: Adequate for Discharge

## 2021-02-08 NOTE — Discharge Summary (Signed)
Physician Discharge Summary  [name] ZOX:[id number] DOB: [birth year] DOA: 02/04/2021  PCP: [name], DO  Admit date: 02/04/2021 Discharge date: 02/08/2021  Admitted From: Home Disposition:  Home with home health, declined SNF placement   Recommendations for Outpatient Follow-up:  1. Follow up with PCP in 1 week 2. Follow up with cardiology   Discharge Condition: Stable CODE STATUS: DNR  Diet recommendation:  Diet Orders (From admission, onward)    Start     Ordered   02/08/21 0727  Diet Heart Room service appropriate? Yes; Fluid consistency: Thin; Fluid restriction: 2000 mL Fluid  Diet effective now       Question Answer Comment  Room service appropriate? Yes   Fluid consistency: Thin   Fluid restriction: 2000 mL Fluid      02/08/21 0726   02/08/21 0000  Diet - low sodium heart healthy        02/08/21 1102         Brief/Interim Summary: Belinda Anderson is an 87 years old female with PMH significant for dementia, chronic diastolic heart failure, persistent A. 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  Recent echocardiogram 06/21 shows LVEF 65 to 70%: No regional wall motion abnormalities.  She is found to have BNP of 306, troponin negative x 2.  EKG shows A. Fib,  rate controlled no ST-T wave changes. She was admitted for acute on chronic diastolic heart failure exacerbation.  She is also found to have a UTI and started on IV ceftriaxone. Patient continued to have clinical improvement, Lasix deescalated to home dosing. UTI treated. Her mentation returned to her baseline dementia.   Discharge Diagnoses:  Principal Problem:   Acute on chronic diastolic (congestive) heart failure (HCC) Active Problems:   Hypothyroidism   AF (paroxysmal atrial  fibrillation) (HCC)   SOB (shortness of breath)   CKD (chronic kidney disease), stage III (HCC)   Acute on chronic diastolic heart failure: She presented with one week hx of progressive shortness of breath associated with orthopnea, worsening peripheral edema and 8 pound weight gain over this time. Recent echocardiogram 06/21 showed LVEF 65 to 70%, no regional wall motion abnormalities. Patient takes Lasix 60 mg p.o. daily at home Continue Lasix 40 mg IV twice daily --> resume home lasix dose  Repeat echocardiogram shows EF 55 to 60%.  Discussed with cardiologist Dr. Jens Som. He recommended to continue current management and outpatient follow up.  Persistent atrial fibrillation: Continue anticoagulation with Xarelto.   Continue Cardizem CD  Hypothyroidism:  Continue Synthroid  CKD stage IIIb:  Baseline serum creatinine 1.6-1.8 Renal function remains at baseline.  UTI: UA consistent with UTI Completed antibiotics   Depression: Continue Zoloft  Dementia: Patient is alert but oriented x 1 She does have intermittent agitation and restlessness. Continue Haldol 1 mg every 8 hours as needed for agitation and restlessness.  Hypokalemia: Replace   Discharge Instructions  Discharge Instructions    (HEART FAILURE PATIENTS) Call MD:  Anytime you have any of the following symptoms: 1) 3 pound weight gain in 24 hours or 5 pounds in 1 week 2) shortness of breath, with or without a dry hacking cough 3) swelling in the hands, feet or stomach 4) if you have to sleep on extra pillows at night in order to breathe.   Complete by: As directed  Call MD for:  difficulty breathing, headache or visual disturbances   Complete by: As directed    Call MD for:  extreme fatigue   Complete by: As directed    Call MD for:  persistant dizziness or light-headedness   Complete by: As directed    Call MD for:  persistant nausea and vomiting   Complete by: As directed    Call MD for:  severe  uncontrolled pain   Complete by: As directed    Call MD for:  temperature >100.4   Complete by: As directed    Diet - low sodium heart healthy   Complete by: As directed    Discharge instructions   Complete by: As directed    You were cared for by a hospitalist during your hospital stay. If you have any questions about your discharge medications or the care you received while you were in the hospital after you are discharged, you can call the unit and ask to speak with the hospitalist on call if the hospitalist that took care of you is not available. Once you are discharged, your primary care physician will handle any further medical issues. Please note that NO REFILLS for any discharge medications will be authorized once you are discharged, as it is imperative that you return to your primary care physician (or establish a relationship with a primary care physician if you do not have one) for your aftercare needs so that they can reassess your need for medications and monitor your lab values.   Increase activity slowly   Complete by: As directed      Allergies as of 02/08/2021   No Known Allergies     Medication List    STOP taking these medications   Pfizer-BioNTech COVID-19 Vacc 30 MCG/0.3ML injection Generic drug: COVID-19 mRNA vaccine (Pfizer)     TAKE these medications   acetaminophen 650 MG CR tablet Commonly known as: TYLENOL Take 1,300 mg by mouth daily as needed for pain (arthritis).   [employer] Dry Eye Relief 0.2-0.2-1 % Soln Generic drug: Glycerin-Hypromellose-PEG 400 Place 1 drop into both eyes daily as needed (Dry eye).   diclofenac Sodium 1 % Gel Commonly known as: VOLTAREN Apply 2 g topically daily as needed (pain).   diltiazem 180 MG 24 hr capsule Commonly known as: CARDIZEM CD TAKE 1 CAPSULE(180 MG) BY MOUTH DAILY   furosemide 20 MG tablet Commonly known as: LASIX Take 3 tablets (60 mg total) by mouth daily.   haloperidol 1 MG tablet Commonly known as:  HALDOL Take 1 tablet (1 mg total) by mouth every 8 (eight) hours as needed for agitation.   levothyroxine 75 MCG tablet Commonly known as: SYNTHROID Take 1 tablet (75 mcg total) by mouth daily before breakfast.   Metamucil 0.52 g capsule Generic drug: psyllium Take 0.52 g by mouth daily.   NONFORMULARY OR COMPOUNDED ITEM rollator walker  #1  As directed -------  Dx  CHF,  DOE,  OA   potassium chloride SA 20 MEQ tablet Commonly known as: KLOR-CON Take 2 tablets (40 mEq total) by mouth daily.   sertraline 25 MG tablet Commonly known as: Zoloft Take 1 tablet (25 mg total) by mouth daily.   Xarelto 15 MG Tabs tablet Generic drug: Rivaroxaban TAKE 1 TABLET(15 MG) BY MOUTH DAILY WITH BREAKFAST DISCONTINUE ELIQUIS       Follow-up Information    Seabron SpatesLowne Chase, Yvonne R, DO. Schedule an appointment as soon as possible for a visit in 1 week(s).  Specialty: Family Medicine Contact information: [address] [place] [address] [phone]        Regan Lemming, MD .   Specialty: Cardiology Contact information: [address] [place] [address] [phone]              No Known Allergies     Procedures/Studies: DG Chest Port 1 View  Result Date: 02/04/2021 CLINICAL DATA:  Shortness of breath EXAM: PORTABLE CHEST 1 VIEW COMPARISON:  10/22/2020 FINDINGS: Cardiac shadow is at the upper limits of normal in size. Lungs are clear bilaterally. No bony abnormality is seen. IMPRESSION: No active disease. Electronically Signed   By: Alcide Clever M.D.   On: 02/04/2021 23:15   ECHOCARDIOGRAM COMPLETE  Result Date: 02/05/2021    ECHOCARDIOGRAM REPORT   Patient Name:   Belinda Anderson Date of Exam: 02/05/2021 Medical Rec #:  [id number]       Height:       67.5 in Accession #:    [id number]      Weight:       140.0 lb Date of Birth:  [birth year]       BSA:          1.747 m Patient Age:    88 years        BP:           120/67 mmHg Patient Gender:  F               HR:           86 bpm. Exam Location:  Inpatient Procedure: 2D Echo, Cardiac Doppler and Color Doppler Indications:    I50.23 Acute on chronic systolic (congestive) heart failure  History:        Patient has prior history of Echocardiogram examinations, most                 recent 04/15/2020. TIA, Arrythmias:Atrial Fibrillation; Risk                 Factors:Hypertension. Thyroid Disease.  Sonographer:    Tiffany Dance Referring Phys: [id number] Angie Fava IMPRESSIONS  1. Left ventricular systolic function is normal. EF 55-60%. No regional wall motion abnormalities. There is mild focal hypertrophy of the basilar septum. Diastolic function is indeterminate due to atrial fibrillation. Findings suggestive of restrictive cardiomyopathy.  2. Right ventricular systolic function is mildly reduced. The right ventricular size is moderately enlarged. There is normal pulmonary artery systolic pressure.  3. Left atrial size was severely dilated.  4. Right atrial size was severely dilated.  5. The mitral valve is normal in structure. Mild mitral valve regurgitation. No evidence of mitral stenosis.  6. Tricuspid valve regurgitation is severe.  7. The aortic valve is tricuspid. There is moderate calcification of the aortic valve. Aortic valve regurgitation is not visualized. No aortic stenosis is present.  8. The inferior vena cava is normal in size with greater than 50% respiratory variability, suggesting right atrial pressure of 3 mmHg. FINDINGS  Left Ventricle: Left ventricular ejection fraction, by estimation, is 55 to 60%. The left ventricle has normal function. Left ventricular endocardial border not optimally defined to evaluate regional wall motion. The left ventricular internal cavity size was normal in size. There is no left ventricular hypertrophy of the basal-septal segment. Left ventricular diastolic function could not be evaluated due to atrial fibrillation. Left ventricular diastolic function could  not be evaluated. Right Ventricle: The right ventricular size is moderately  enlarged. No increase in right ventricular wall thickness. Right ventricular systolic function is mildly reduced. There is normal pulmonary artery systolic pressure. The tricuspid regurgitant velocity is 2.56 m/s, and with an assumed right atrial pressure of 8 mmHg, the estimated right ventricular systolic pressure is 34.2 mmHg. Left Atrium: Left atrial size was severely dilated. Right Atrium: Right atrial size was severely dilated. Pericardium: There is no evidence of pericardial effusion. Mitral Valve: The mitral valve is normal in structure. Mild mitral annular calcification. Mild mitral valve regurgitation. No evidence of mitral valve stenosis. Tricuspid Valve: The tricuspid valve is normal in structure. Tricuspid valve regurgitation is severe. No evidence of tricuspid stenosis. Aortic Valve: The aortic valve is tricuspid. There is moderate calcification of the aortic valve. Aortic valve regurgitation is not visualized. No aortic stenosis is present. Aortic valve mean gradient measures 4.0 mmHg. Aortic valve peak gradient measures 7.6 mmHg. Aortic valve area, by VTI measures 1.49 cm. Pulmonic Valve: The pulmonic valve was grossly normal. Pulmonic valve regurgitation is trivial. No evidence of pulmonic stenosis. Aorta: The aortic root is normal in size and structure. Venous: The inferior vena cava is normal in size with greater than 50% respiratory variability, suggesting right atrial pressure of 3 mmHg. IAS/Shunts: No atrial level shunt detected by color flow Doppler.  LEFT VENTRICLE PLAX 2D LVIDd:         4.00 cm LVIDs:         2.60 cm LV PW:         1.20 cm LV IVS:        1.00 cm LVOT diam:     2.00 cm LV SV:         39 LV SV Index:   23 LVOT Area:     3.14 cm  RIGHT VENTRICLE            IVC RV Basal diam:  3.80 cm    IVC diam: 2.50 cm RV Mid diam:    2.10 cm RV S prime:     9.68 cm/s TAPSE (M-mode): 1.3 cm LEFT ATRIUM               Index       RIGHT ATRIUM           Index LA diam:        5.30 cm  3.03 cm/m  RA Area:     18.90 cm LA Vol (A2C):   101.0 ml 57.80 ml/m RA Volume:   53.30 ml  30.50 ml/m LA Vol (A4C):   93.0 ml  53.22 ml/m LA Biplane Vol: 100.0 ml 57.23 ml/m  AORTIC VALVE AV Area (Vmax):    1.35 cm AV Area (Vmean):   1.47 cm AV Area (VTI):     1.49 cm AV Vmax:           138.00 cm/s AV Vmean:          91.000 cm/s AV VTI:            0.264 m AV Peak Grad:      7.6 mmHg AV Mean Grad:      4.0 mmHg LVOT Vmax:         59.25 cm/s LVOT Vmean:        42.650 cm/s LVOT VTI:          0.126 m LVOT/AV VTI ratio: 0.48  AORTA Ao Root diam: 3.60 cm Ao Asc diam:  3.70 cm MITRAL VALVE  TRICUSPID VALVE MV Area (PHT): 3.72 cm     TR Peak grad:   26.2 mmHg MV Decel Time: 204 msec     TR Vmax:        256.00 cm/s MV E velocity: 110.00 cm/s                             SHUNTS                             Systemic VTI:  0.13 m                             Systemic Diam: 2.00 cm Arvilla Meres MD Electronically signed by Arvilla Meres MD Signature Date/Time: 02/05/2021/5:02:34 PM    Final        Discharge Exam: Vitals:   02/07/21 2033 02/08/21 0600  BP: 126/74 102/68  Pulse: (!) 103 75  Resp: 18 18  Temp: 98.4 F (36.9 C) 98.6 F (37 C)  SpO2: 98% 91%    General: Pt is alert, awake, not in acute distress Cardiovascular: S1/S2 +, no edema Respiratory: CTA bilaterally, no wheezing, no rhonchi, no respiratory distress, no conversational dyspnea  Abdominal: Soft, NT, ND, bowel sounds + Extremities: no edema, no cyanosis Psych: Normal mood and affect, Stable dementia     The results of significant diagnostics from this hospitalization (including imaging, microbiology, ancillary and laboratory) are listed below for reference.     Microbiology: Recent Results (from the past 240 hour(s))  Resp Panel by RT-PCR (Flu A&B, Covid) Nasopharyngeal Swab     Status: None   Collection Time: 02/04/21 11:36 PM   Specimen:  Nasopharyngeal Swab; Nasopharyngeal(NP) swabs in vial transport medium  Result Value Ref Range Status   SARS Coronavirus 2 by RT PCR NEGATIVE NEGATIVE Final    Comment: (NOTE) SARS-CoV-2 target nucleic acids are NOT DETECTED.  The SARS-CoV-2 RNA is generally detectable in upper respiratory specimens during the acute phase of infection. The lowest concentration of SARS-CoV-2 viral copies this assay can detect is 138 copies/mL. A negative result does not preclude SARS-Cov-2 infection and should not be used as the sole basis for treatment or other patient management decisions. A negative result may occur with  improper specimen collection/handling, submission of specimen other than nasopharyngeal swab, presence of viral mutation(s) within the areas targeted by this assay, and inadequate number of viral copies(<138 copies/mL). A negative result must be combined with clinical observations, patient history, and epidemiological information. The expected result is Negative.  Fact Sheet for Patients:  BloggerCourse.com  Fact Sheet for Healthcare Providers:  SeriousBroker.it  This test is no t yet approved or cleared by the [place] FDA and  has been authorized for detection and/or diagnosis of SARS-CoV-2 by FDA under an Emergency Use Authorization (EUA). This EUA will remain  in effect (meaning this test can be used) for the duration of the COVID-19 declaration under Section 564(b)(1) of the Act, 21 U.S.C.section 360bbb-3(b)(1), unless the authorization is terminated  or revoked sooner.       Influenza A by PCR NEGATIVE NEGATIVE Final   Influenza B by PCR NEGATIVE NEGATIVE Final    Comment: (NOTE) The Xpert Xpress SARS-CoV-2/FLU/RSV plus assay is intended as an aid in the diagnosis of influenza from Nasopharyngeal swab specimens and should not be used as a sole basis for treatment.  Nasal washings and aspirates are unacceptable for  Xpert Xpress SARS-CoV-2/FLU/RSV testing.  Fact Sheet for Patients: BloggerCourse.com  Fact Sheet for Healthcare Providers: SeriousBroker.it  This test is not yet approved or cleared by the [place] FDA and has been authorized for detection and/or diagnosis of SARS-CoV-2 by FDA under an Emergency Use Authorization (EUA). This EUA will remain in effect (meaning this test can be used) for the duration of the COVID-19 declaration under Section 564(b)(1) of the Act, 21 U.S.C. section 360bbb-3(b)(1), unless the authorization is terminated or revoked.  Performed at [facility], [address] [place], [place] [address]      Labs: BNP (last 3 results) Recent Labs    04/16/20 1240 02/04/21 2108  BNP 487.2* 305.9*   Basic Metabolic Panel: Recent Labs  Lab 02/04/21 2108 02/05/21 0000 02/05/21 0445 02/06/21 0428 02/07/21 0638 02/08/21 0509  NA 141  --  145 139 138 139  K 3.8  --  3.0* 3.6 3.2* 3.2*  CL 111  --  108 106 103 104  CO2 18*  --  GLUCOSE 110*  --  109* 94 85 107*  BUN 21  --  18 24* 28* 35*  CREATININE 1.70*  --  1.62* 1.83* 1.50* 1.45*  CALCIUM 9.5  --  9.3 9.0 8.6* 8.7*  MG  --  2.2 2.0  --   --   --   PHOS  --   --  3.3  --   --   --    Liver Function Tests: Recent Labs  Lab 02/04/21 2108  AST 23  ALT 13  ALKPHOS 129*  BILITOT 0.5  PROT 6.6  ALBUMIN 3.7   Recent Labs  Lab 02/04/21 2108  LIPASE 40   No results for input(s): AMMONIA in the last 168 hours. CBC: Recent Labs  Lab 02/04/21 2108 02/05/21 0445  WBC 9.5 7.8  NEUTROABS 7.2  --   HGB 11.7* 13.3  HCT 36.0 41.7  MCV 89.6 90.8  PLT 263 277   Cardiac Enzymes: No results for input(s): CKTOTAL, CKMB, CKMBINDEX, TROPONINI in the last 168 hours. BNP: Invalid input(s): POCBNP CBG: No results for input(s): GLUCAP in the last 168 hours. D-Dimer No results for input(s): DDIMER in the last 72  hours. Hgb A1c No results for input(s): HGBA1C in the last 72 hours. Lipid Profile No results for input(s): CHOL, HDL, LDLCALC, TRIG, CHOLHDL, LDLDIRECT in the last 72 hours. Thyroid function studies No results for input(s): TSH, T4TOTAL, T3FREE, THYROIDAB in the last 72 hours.  Invalid input(s): FREET3 Anemia work up No results for input(s): VITAMINB12, FOLATE, FERRITIN, TIBC, IRON, RETICCTPCT in the last 72 hours. Urinalysis    Component Value Date/Time   COLORURINE YELLOW 02/04/2021 2226   APPEARANCEUR CLEAR 02/04/2021 2226   LABSPEC 1.006 02/04/2021 2226   PHURINE 5.0 02/04/2021 2226   GLUCOSEU NEGATIVE 02/04/2021 2226   GLUCOSEU NEGATIVE 12/02/2019 1457   HGBUR MODERATE (A) 02/04/2021 2226   HGBUR trace-lysed 11/24/2009 0915   BILIRUBINUR NEGATIVE 02/04/2021 2226   BILIRUBINUR Neg 02/20/2013 1415   KETONESUR NEGATIVE 02/04/2021 2226   PROTEINUR NEGATIVE 02/04/2021 2226   UROBILINOGEN 0.2 12/02/2019 1457   NITRITE NEGATIVE 02/04/2021 2226   LEUKOCYTESUR LARGE (A) 02/04/2021 2226   Sepsis Labs Invalid input(s): PROCALCITONIN,  WBC,  LACTICIDVEN Microbiology Recent Results (from the past 240 hour(s))  Resp Panel by RT-PCR (Flu A&B, Covid) Nasopharyngeal Swab     Status: None   Collection  Time: 02/04/21 11:36 PM   Specimen: Nasopharyngeal Swab; Nasopharyngeal(NP) swabs in vial transport medium  Result Value Ref Range Status   SARS Coronavirus 2 by RT PCR NEGATIVE NEGATIVE Final    Comment: (NOTE) SARS-CoV-2 target nucleic acids are NOT DETECTED.  The SARS-CoV-2 RNA is generally detectable in upper respiratory specimens during the acute phase of infection. The lowest concentration of SARS-CoV-2 viral copies this assay can detect is 138 copies/mL. A negative result does not preclude SARS-Cov-2 infection and should not be used as the sole basis for treatment or other patient management decisions. A negative result may occur with  improper specimen collection/handling,  submission of specimen other than nasopharyngeal swab, presence of viral mutation(s) within the areas targeted by this assay, and inadequate number of viral copies(<138 copies/mL). A negative result must be combined with clinical observations, patient history, and epidemiological information. The expected result is Negative.  Fact Sheet for Patients:  BloggerCourse.com  Fact Sheet for Healthcare Providers:  SeriousBroker.it  This test is no t yet approved or cleared by the [place] FDA and  has been authorized for detection and/or diagnosis of SARS-CoV-2 by FDA under an Emergency Use Authorization (EUA). This EUA will remain  in effect (meaning this test can be used) for the duration of the COVID-19 declaration under Section 564(b)(1) of the Act, 21 U.S.C.section 360bbb-3(b)(1), unless the authorization is terminated  or revoked sooner.       Influenza A by PCR NEGATIVE NEGATIVE Final   Influenza B by PCR NEGATIVE NEGATIVE Final    Comment: (NOTE) The Xpert Xpress SARS-CoV-2/FLU/RSV plus assay is intended as an aid in the diagnosis of influenza from Nasopharyngeal swab specimens and should not be used as a sole basis for treatment. Nasal washings and aspirates are unacceptable for Xpert Xpress SARS-CoV-2/FLU/RSV testing.  Fact Sheet for Patients: BloggerCourse.com  Fact Sheet for Healthcare Providers: SeriousBroker.it  This test is not yet approved or cleared by the [place] FDA and has been authorized for detection and/or diagnosis of SARS-CoV-2 by FDA under an Emergency Use Authorization (EUA). This EUA will remain in effect (meaning this test can be used) for the duration of the COVID-19 declaration under Section 564(b)(1) of the Act, 21 U.S.C. section 360bbb-3(b)(1), unless the authorization is terminated or revoked.  Performed at [facility], [address] [place], [place] [address]      Patient was seen and examined on the day of discharge and was found to be in stable condition. Time coordinating discharge: 35 minutes including assessment and coordination of care, as well as examination of the patient.   SIGNED:  Noralee Stain, DO [facility] 02/08/2021, 11:03 AM

## 2021-02-08 NOTE — TOC Transition Note (Addendum)
Transition of Care [facility]) - CM/SW Discharge Note   Patient Details  Name: Belinda Anderson MRN: [id number] Date of Birth: [birth year]  Transition of Care [facility]) CM/SW Contact:  Lanier Clam, RN Phone Number: 02/08/2021, 10:24 AM   Clinical Narrative:  D/c home w/HHC [facility] chosen-dtr Marie [phone] patient will stay:Carol fenten(sister) [address] [place] [id number]-[facility] aware.No further CM needs.  4:10p-patient already d/c-received call from dtr Marie-states she now is unable to care for her Mom @ home-CM reminded her of her decline of SNF when recc, & her choice for [facility] services-also the attending had a conversation w/dtr that she chose going home w/HHC. Provided dtr w/additional resources-[facility], & tel#, also reminded her of private duty care services she can call or go online for resources.she voiced understanding. No further CM needs.   Barriers to Discharge: No Barriers Identified   Patient Goals and CMS Choice Patient states their goals for this hospitalization and ongoing recovery are:: go to rehab CMS Medicare.gov Compare Post Acute Care list provided to:: Patient Represenative (must comment) [name] dtr [phone]) Choice offered to / list presented to : Adult Children  Discharge Placement                       Discharge Plan and Services   Discharge Planning Services: CM Consult                               Social Determinants of Health (SDOH) Interventions     Readmission Risk Interventions No flowsheet data found.

## 2021-02-08 NOTE — Care Management Important Message (Signed)
Important Message  Patient Details IM Letter given to the Patient. Name: Belinda Anderson MRN: [id number] Date of Birth: 1932/01/05   Medicare Important Message Given:  Yes     Caren Macadam 02/08/2021, 12:40 PM

## 2021-02-09 ENCOUNTER — Other Ambulatory Visit: Payer: Self-pay | Admitting: Family Medicine

## 2021-02-09 ENCOUNTER — Ambulatory Visit: Payer: Medicare Other | Admitting: Family Medicine

## 2021-02-09 ENCOUNTER — Encounter: Payer: Self-pay | Admitting: Family Medicine

## 2021-02-09 ENCOUNTER — Other Ambulatory Visit: Payer: Self-pay

## 2021-02-09 ENCOUNTER — Ambulatory Visit (HOSPITAL_BASED_OUTPATIENT_CLINIC_OR_DEPARTMENT_OTHER)
Admission: RE | Admit: 2021-02-09 | Discharge: 2021-02-09 | Disposition: A | Discharge status: Home / Self Care | Payer: Medicare Other | Source: Ambulatory Visit | Attending: Family Medicine | Admitting: Family Medicine

## 2021-02-09 VITALS — BP 124/72 | HR 112 | Temp 98.7°F | Resp 16

## 2021-02-09 DIAGNOSIS — N390 Urinary tract infection, site not specified: Secondary | ICD-10-CM | POA: Diagnosis not present

## 2021-02-09 DIAGNOSIS — I10 Essential (primary) hypertension: Secondary | ICD-10-CM

## 2021-02-09 DIAGNOSIS — R531 Weakness: Secondary | ICD-10-CM

## 2021-02-09 DIAGNOSIS — L0390 Cellulitis, unspecified: Secondary | ICD-10-CM | POA: Diagnosis not present

## 2021-02-09 DIAGNOSIS — I480 Paroxysmal atrial fibrillation: Secondary | ICD-10-CM

## 2021-02-09 DIAGNOSIS — I5033 Acute on chronic diastolic (congestive) heart failure: Secondary | ICD-10-CM

## 2021-02-09 DIAGNOSIS — N1832 Chronic kidney disease, stage 3b: Secondary | ICD-10-CM

## 2021-02-09 DIAGNOSIS — I509 Heart failure, unspecified: Secondary | ICD-10-CM

## 2021-02-09 DIAGNOSIS — R600 Localized edema: Secondary | ICD-10-CM

## 2021-02-09 DIAGNOSIS — M79672 Pain in left foot: Secondary | ICD-10-CM | POA: Insufficient documentation

## 2021-02-09 DIAGNOSIS — F0390 Unspecified dementia without behavioral disturbance: Secondary | ICD-10-CM

## 2021-02-09 DIAGNOSIS — M109 Gout, unspecified: Secondary | ICD-10-CM

## 2021-02-09 LAB — COMPREHENSIVE METABOLIC PANEL
ALT: 13 U/L (ref 0–35)
AST: 23 U/L (ref 0–37)
Albumin: 3.8 g/dL (ref 3.5–5.2)
Alkaline Phosphatase: 133 U/L — ABNORMAL HIGH (ref 39–117)
BUN: 29 mg/dL — ABNORMAL HIGH (ref 6–23)
CO2: 28 mEq/L (ref 19–32)
Calcium: 8.9 mg/dL (ref 8.4–10.5)
Chloride: 100 mEq/L (ref 96–112)
Creatinine, Ser: 1.52 mg/dL — ABNORMAL HIGH (ref 0.40–1.20)
GFR: 30.81 mL/min — ABNORMAL LOW (ref >60.00)
Glucose, Bld: 105 mg/dL — ABNORMAL HIGH (ref 70–99)
Potassium: 4 mEq/L (ref 3.5–5.1)
Sodium: 138 mEq/L (ref 135–145)
Total Bilirubin: 0.8 mg/dL (ref 0.2–1.2)
Total Protein: 6.5 g/dL (ref 6.0–8.3)

## 2021-02-09 LAB — CBC WITH DIFFERENTIAL/PLATELET
Basophils Absolute: 0 10*3/uL (ref 0.0–0.1)
Basophils Relative: 0.6 % (ref 0.0–3.0)
Eosinophils Absolute: 0 10*3/uL (ref 0.0–0.7)
Eosinophils Relative: 0.3 % (ref 0.0–5.0)
HCT: 38.9 % (ref 36.0–46.0)
Hemoglobin: 12.7 g/dL (ref 12.0–15.0)
Lymphocytes Relative: 13.9 % (ref 12.0–46.0)
Lymphs Abs: 1.1 10*3/uL (ref 0.7–4.0)
MCHC: 32.5 g/dL (ref 30.0–36.0)
MCV: 88 fl (ref 78.0–100.0)
Monocytes Absolute: 0.9 10*3/uL (ref 0.1–1.0)
Monocytes Relative: 11.6 % (ref 3.0–12.0)
Neutro Abs: 5.8 10*3/uL (ref 1.4–7.7)
Neutrophils Relative %: 73.6 % (ref 43.0–77.0)
Platelets: 279 10*3/uL (ref 150.0–400.0)
RBC: 4.42 Mil/uL (ref 3.87–5.11)
RDW: 17 % — ABNORMAL HIGH (ref 11.5–15.5)
WBC: 7.9 10*3/uL (ref 4.0–10.5)

## 2021-02-09 LAB — URINALYSIS
Bilirubin Urine: NEGATIVE
Hgb urine dipstick: NEGATIVE
Ketones, ur: NEGATIVE
Leukocytes,Ua: NEGATIVE
Nitrite: NEGATIVE
Specific Gravity, Urine: <=1.005 — AB (ref 1.000–1.030)
Total Protein, Urine: NEGATIVE
Urine Glucose: NEGATIVE
Urobilinogen, UA: 0.2 (ref 0.0–1.0)
pH: 6 (ref 5.0–8.0)

## 2021-02-09 LAB — URIC ACID: Uric Acid, Serum: 8.9 mg/dL — ABNORMAL HIGH (ref 2.4–7.0)

## 2021-02-09 MED ORDER — DILTIAZEM HCL ER COATED BEADS 180 MG PO CP24: ORAL_CAPSULE | ORAL | 0 refills | Status: DC

## 2021-02-09 MED ORDER — CEPHALEXIN 500 MG PO CAPS: 500.0000 mg | ORAL_CAPSULE | Freq: Three times a day (TID) | ORAL | 0 refills | Status: DC

## 2021-02-09 MED ORDER — SERTRALINE HCL 50 MG PO TABS: 50.0000 mg | ORAL_TABLET | Freq: Every day | ORAL | 0 refills | Status: DC

## 2021-02-09 MED ORDER — COLCHICINE 0.6 MG PO TABS: 0.6000 mg | ORAL_TABLET | Freq: Two times a day (BID) | ORAL | 0 refills | Status: DC

## 2021-02-09 MED ORDER — HALOPERIDOL 1 MG PO TABS: 1.0000 mg | ORAL_TABLET | Freq: Every day | ORAL | 0 refills | Status: DC | PRN

## 2021-02-09 MED ORDER — RIVAROXABAN 15 MG PO TABS: ORAL_TABLET | ORAL | 0 refills | Status: DC

## 2021-02-09 NOTE — Progress Notes (Signed)
Subjective:    Patient ID: Belinda Anderson, female    DOB: 11/19/1931, 87 y.o.   MRN: [id number]  Chief Complaint  Patient presents with  . Hospitalization Follow-up    Pt has pain in both legs but left leg was red and warm to the touch last night.    HPI Patient is in today for hospital follow up. She is accompanied by her daughter. After her recent hospitalization she has gotten weaker and more difficult to care for. Her family can no longer manage her at home. They need to consider placing her in a facility. Her edema seems improved since the hospitalization but her left foot continues to hurt and be swollen. They are unaware of any fall or trauma. Patient's dementia is worsening but she denies any pain except in her foot upon questioning. No change in appetite or other new acute concerns. No fevers. Denies CP/palp/HA/congestion/fevers/GI or GU c/o. Taking meds as prescribed  Past Medical History:  Diagnosis Date  . Arthritis   . Atrial fibrillation (HCC)   . Hypertension   . Thyroid disease   . TIA (transient ischemic attack)     Past Surgical History:  Procedure Laterality Date  . CARDIOVERSION N/A 04/15/2020   Procedure: CARDIOVERSION;  Surgeon: Parke Poisson, MD;  Location: [facility] ENDOSCOPY;  Service: Cardiovascular;  Laterality: N/A;  . EYE SURGERY     lens implant  . IR ANGIO VERTEBRAL SEL SUBCLAVIAN INNOMINATE UNI R MOD SED  09/30/2019  . IR CT HEAD LTD  09/30/2019  . IR PERCUTANEOUS ART THROMBECTOMY/INFUSION INTRACRANIAL INC DIAG ANGIO  09/30/2019  . RADIOLOGY WITH ANESTHESIA N/A 09/30/2019   Procedure: IR WITH ANESTHESIA;  Surgeon: Julieanne Cotton, MD;  Location: [facility] OR;  Service: Radiology;  Laterality: N/A;  . TEE WITHOUT CARDIOVERSION N/A 04/15/2020   Procedure: TRANSESOPHAGEAL ECHOCARDIOGRAM (TEE);  Surgeon: Parke Poisson, MD;  Location: [facility] ENDOSCOPY;  Service: Cardiovascular;  Laterality: N/A;    Family History  Problem Relation Age of Onset  .  Arthritis Mother   . Heart disease Mother   . Heart disease Father 16       MI  . Coronary artery disease Other   . Diabetes Brother        DI    Social History   Socioeconomic History  . Marital status: Widowed    Spouse name: Not on file  . Number of children: 3  . Years of education: Not on file  . Highest education level: Not on file  Occupational History  . Not on file  Tobacco Use  . Smoking status: Never Smoker  . Smokeless tobacco: Never Used  Vaping Use  . Vaping Use: Never used  Substance and Sexual Activity  . Alcohol use: No  . Drug use: No  . Sexual activity: Not Currently    Partners: Male  Other Topics Concern  . 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 Worried About Programme researcher, broadcasting/film/video in the Last Year: Never true  . Ran Out of Food in the Last Year: Never true  Transportation Needs: No Transportation Needs  . Lack of Transportation (Medical): No  . Lack of Transportation (Non-Medical): No  Physical Activity: Not on file  Stress: Not on file  Social Connections: Not on file  Intimate Partner  Violence: Not on file    Outpatient Medications Prior to Visit  Medication Sig Dispense Refill  . acetaminophen (TYLENOL) 650 MG CR tablet Take 1,300 mg by mouth daily as needed for pain (arthritis).     Marland Kitchen. diclofenac Sodium (VOLTAREN) 1 % GEL Apply 2 g topically daily as needed (pain). 150 g 3  . furosemide (LASIX) 20 MG tablet Take 3 tablets (60 mg total) by mouth daily.    . Glycerin-Hypromellose-PEG 400 ([facility] DRY EYE RELIEF) 0.2-0.2-1 % SOLN Place 1 drop into both eyes daily as needed (Dry eye).    Marland Kitchen. levothyroxine (SYNTHROID) 75 MCG tablet Take 1 tablet (75 mcg total) by mouth daily before breakfast. 90 tablet 0  . NONFORMULARY OR COMPOUNDED ITEM rollator walker  #1  As directed  -------  Dx  CHF,  DOE,  OA 1 each 0  . potassium chloride SA (KLOR-CON) 20 MEQ tablet Take 2 tablets (40 mEq total) by mouth daily. 90 tablet 1  . psyllium (METAMUCIL) 0.52 g capsule Take 0.52 g by mouth daily.    Marland Kitchen. diltiazem (CARDIZEM CD) 180 MG 24 hr capsule TAKE 1 CAPSULE(180 MG) BY MOUTH DAILY 90 capsule 2  . haloperidol (HALDOL) 1 MG tablet Take 1 tablet (1 mg total) by mouth every 8 (eight) hours as needed for agitation. 30 tablet 0  . sertraline (ZOLOFT) 25 MG tablet Take 1 tablet (25 mg total) by mouth daily. 90 tablet 3  . XARELTO 15 MG TABS tablet TAKE 1 TABLET(15 MG) BY MOUTH DAILY WITH BREAKFAST DISCONTINUE ELIQUIS 30 tablet 5   No facility-administered medications prior to visit.    No Known Allergies  Review of Systems  Constitutional: Positive for malaise/fatigue. Negative for fever.  HENT: Negative for congestion.   Eyes: Negative for blurred vision.  Respiratory: Positive for shortness of breath.   Cardiovascular: Positive for leg swelling. Negative for chest pain and palpitations.  Gastrointestinal: Negative for abdominal pain, blood in stool and nausea.  Genitourinary: Negative for dysuria and frequency.  Musculoskeletal: Negative for falls.  Skin: Negative for rash.  Neurological: Negative for dizziness, loss of consciousness and headaches.  Endo/Heme/Allergies: Negative for environmental allergies.  Psychiatric/Behavioral: Positive for memory loss. Negative for depression. The patient is not nervous/anxious.        Objective:    Physical Exam Vitals and nursing note reviewed.  Constitutional:      General: She is not in acute distress.    Appearance: She is well-developed.  HENT:     Head: Normocephalic and atraumatic.     Nose: Nose normal.  Eyes:     General:        Right eye: No discharge.        Left eye: No discharge.  Cardiovascular:     Rate and Rhythm: Normal rate and regular rhythm.     Heart sounds: No murmur heard.   Pulmonary:      Effort: Pulmonary effort is normal.     Breath sounds: Normal breath sounds.  Abdominal:     General: Bowel sounds are normal.     Palpations: Abdomen is soft.     Tenderness: There is no abdominal tenderness.  Musculoskeletal:     Cervical back: Normal range of motion and neck supple.     Right lower leg: Edema present.     Left lower leg: Edema present.     Comments: Left foot swollen with mild erythema at plantar surface. Pain with palp over medial malleolus.   Skin:  General: Skin is warm and dry.  Neurological:     Mental Status: She is alert and oriented to person, place, and time.     BP 124/72   Pulse (!) 112   Temp 98.7 F (37.1 C)   Resp 16   SpO2 99%  Wt Readings from Last 3 Encounters:  02/08/21 151 lb 14.4 oz (68.9 kg)  11/05/20 154 lb 12.8 oz (70.2 kg)  10/22/20 156 lb (70.8 kg)    Diabetic Foot Exam - Simple   No data filed    Lab Results  Component Value Date   WBC 7.9 02/09/2021   HGB 12.7 02/09/2021   HCT 38.9 02/09/2021   PLT 279.0 02/09/2021   GLUCOSE 105 (H) 02/09/2021   CHOL 117 01/30/2020   TRIG 75.0 01/30/2020   HDL 33.80 (L) 01/30/2020   LDLDIRECT 97.0 04/06/2018   LDLCALC 68 01/30/2020   ALT 13 02/09/2021   AST 23 02/09/2021   NA 138 02/09/2021   K 4.0 02/09/2021   CL 100 02/09/2021   CREATININE 1.52 (H) 02/09/2021   BUN 29 (H) 02/09/2021   CO2 28 02/09/2021   TSH 1.738 02/05/2021   INR 1.6 (H) 02/04/2021   HGBA1C 6.1 11/26/2020    Lab Results  Component Value Date   TSH 1.738 02/05/2021   Lab Results  Component Value Date   WBC 7.9 02/09/2021   HGB 12.7 02/09/2021   HCT 38.9 02/09/2021   MCV 88.0 02/09/2021   PLT 279.0 02/09/2021   Lab Results  Component Value Date   NA 138 02/09/2021   K 4.0 02/09/2021   CO2 28 02/09/2021   GLUCOSE 105 (H) 02/09/2021   BUN 29 (H) 02/09/2021   CREATININE 1.52 (H) 02/09/2021   BILITOT 0.8 02/09/2021   ALKPHOS 133 (H) 02/09/2021   AST 23 02/09/2021   ALT 13 02/09/2021   PROT  6.5 02/09/2021   ALBUMIN 3.8 02/09/2021   CALCIUM 8.9 02/09/2021   ANIONGAP 12 02/08/2021   GFR 30.81 (L) 02/09/2021   Lab Results  Component Value Date   CHOL 117 01/30/2020   Lab Results  Component Value Date   HDL 33.80 (L) 01/30/2020   Lab Results  Component Value Date   LDLCALC 68 01/30/2020   Lab Results  Component Value Date   TRIG 75.0 01/30/2020   Lab Results  Component Value Date   CHOLHDL 3 01/30/2020   Lab Results  Component Value Date   HGBA1C 6.1 11/26/2020       Assessment & Plan:   Problem List Items Addressed This Visit    Essential hypertension - Primary    Well controlled, no changes to meds. Encouraged heart healthy diet such as the DASH diet and exercise as tolerated.       Relevant Medications   diltiazem (CARDIZEM CD) 180 MG 24 hr capsule   Rivaroxaban (XARELTO) 15 MG TABS tablet   AF (paroxysmal atrial fibrillation) (HCC)    Refills given on Diltiazem and Xarelto until she can get inf or follow up at cardiology      Relevant Medications   diltiazem (CARDIZEM CD) 180 MG 24 hr capsule   Rivaroxaban (XARELTO) 15 MG TABS tablet   Bilateral leg edema    Continue Furosemide and encouraged to elevate feet above heart at least 15 minutes tid      Relevant Orders   Ambulatory referral to Home Health   Comprehensive metabolic panel (Completed)   Acute on chronic diastolic CHF (congestive heart  failure) [facility])    She has just been released from the hospital and is now on Furosemide 60 mg daily and appears to be tolerating it. cmp shows stable renal function      Relevant Medications   diltiazem (CARDIZEM CD) 180 MG 24 hr capsule   Rivaroxaban (XARELTO) 15 MG TABS tablet   CKD (chronic kidney disease), stage III (HCC)    Hydrate and monitor      Left foot pain   Relevant Orders   Uric acid (Completed)   DG Foot Complete Left (Completed)   Weakness    Set up home PT/OT evaluation to treat      Relevant Orders   Ambulatory referral  to Home Health   Urinary tract infection without hematuria   Relevant Medications   cephALEXin (KEFLEX) 500 MG capsule   Other Relevant Orders   Urinalysis (Completed)   Urine Culture   Cellulitis    Left foot red, swollen and painful consider cellulitis and will provide a short course of antibiotics to see if she has improvement      Relevant Orders   Ambulatory referral to Home Health   CBC w/Diff (Completed)   Dementia (HCC)    Patient has gotten to the place where she cannot care for herself and her family all needs to keep working so they are unable to care for her 24 hours a day. They feel they are ready to have her placed where she can be cared for in a facility.  We will place a home health referral to help with with a nursing eval, a PT and OT evaluation while they start looking at homes to decide where they will place her and then the home will supply them with an admission packet we can fill out. Her mood is diminished will increase her Sertraline to 50 mg daily to see if that helps and daughter notes that in the hospital they used Haldol with goo results. She is given a small amount of Haldol to use if agitation occurs. Spent 45 minutes with patient and daughter discussing recent history and plan of care.       Relevant Medications   sertraline (ZOLOFT) 50 MG tablet   haloperidol (HALDOL) 1 MG tablet   Gout    Uric acid level returned notably elevated. Encouraged increased hydration and avoid offending foods. Will give a 1 day course of Colchicine due to her renal functions. 0.6 mg po bid x 1 day and reassess.        Other Visit Diagnoses    Congestive heart failure, unspecified HF chronicity, unspecified heart failure type (HCC)       Relevant Medications   diltiazem (CARDIZEM CD) 180 MG 24 hr capsule   Rivaroxaban (XARELTO) 15 MG TABS tablet   Other Relevant Orders   Ambulatory referral to Home Health      I have discontinued Belinda Anderson "[name]"'s sertraline. I  have changed her Xarelto to Rivaroxaban. I have also changed her haloperidol. Additionally, I am having her start on sertraline and cephALEXin. Lastly, I am having her maintain her acetaminophen, Metamucil, [facility] Dry Eye Relief, diclofenac Sodium, levothyroxine, potassium chloride SA, NONFORMULARY OR COMPOUNDED ITEM, furosemide, and diltiazem.  Meds ordered this encounter  Medications  . diltiazem (CARDIZEM CD) 180 MG 24 hr capsule    Sig: TAKE 1 CAPSULE(180 MG) BY MOUTH DAILY    Dispense:  90 capsule    Refill:  0  . Rivaroxaban (XARELTO) 15 MG TABS tablet  Sig: TAKE 1 TABLET(15 MG) BY MOUTH DAILY WITH BREAKFAST DISCONTINUE ELIQUIS    Dispense:  90 tablet    Refill:  0    ZERO refills remain on this prescription. Your patient is requesting advance approval of refills for this medication to PREVENT ANY MISSED DOSES  . sertraline (ZOLOFT) 50 MG tablet    Sig: Take 1 tablet (50 mg total) by mouth daily.    Dispense:  90 tablet    Refill:  0  . haloperidol (HALDOL) 1 MG tablet    Sig: Take 1 tablet (1 mg total) by mouth daily as needed for agitation.    Dispense:  10 tablet    Refill:  0  . cephALEXin (KEFLEX) 500 MG capsule    Sig: Take 1 capsule (500 mg total) by mouth 3 (three) times daily.    Dispense:  21 capsule    Refill:  0     Danise Edge, MD

## 2021-02-09 NOTE — Patient Instructions (Signed)
Elevate feet above heart for 15 minutes three x a day   Cellulitis, Adult  Cellulitis is a skin infection. The infected area is usually warm, red, swollen, and tender. This condition occurs most often in the arms and lower legs. The infection can travel to the muscles, blood, and underlying tissue and become serious. It is very important to get treated for this condition. What are the causes? Cellulitis is caused by bacteria. The bacteria enter through a break in the skin, such as a cut, burn, insect bite, open sore, or crack. What increases the risk? This condition is more likely to occur in people who:  Have a weak body defense system (immune system).  Have open wounds on the skin, such as cuts, burns, bites, and scrapes. Bacteria can enter the body through these open wounds.  Are older than 85 years of age.  Have diabetes.  Have a type of long-lasting (chronic) liver disease (cirrhosis) or kidney disease.  Are obese.  Have a skin condition such as: ? Itchy rash (eczema). ? Slow movement of blood in the veins (venous stasis). ? Fluid buildup below the skin (edema).  Have had radiation therapy.  Use IV drugs. What are the signs or symptoms? Symptoms of this condition include:  Redness, streaking, or spotting on the skin.  Swollen area of the skin.  Tenderness or pain when an area of the skin is touched.  Warm skin.  A fever.  Chills.  Blisters. How is this diagnosed? This condition is diagnosed based on a medical history and physical exam. You may also have tests, including:  Blood tests.  Imaging tests. How is this treated? Treatment for this condition may include:  Medicines, such as antibiotic medicines or medicines to treat allergies (antihistamines).  Supportive care, such as rest and application of cold or warm cloths (compresses) to the skin.  Hospital care, if the condition is severe. The infection usually starts to get better within 1-2 days of  treatment. Follow these instructions at home: Medicines  Take over-the-counter and prescription medicines only as told by your health care provider.  If you were prescribed an antibiotic medicine, take it as told by your health care provider. Do not stop taking the antibiotic even if you start to feel better. General instructions  Drink enough fluid to keep your urine pale yellow.  Do not touch or rub the infected area.  Raise (elevate) the infected area above the level of your heart while you are sitting or lying down.  Apply warm or cold compresses to the affected area as told by your health care provider.  Keep all follow-up visits as told by your health care provider. This is important. These visits let your health care provider make sure a more serious infection is not developing.   Contact a health care provider if:  You have a fever.  Your symptoms do not begin to improve within 1-2 days of starting treatment.  Your bone or joint underneath the infected area becomes painful after the skin has healed.  Your infection returns in the same area or another area.  You notice a swollen bump in the infected area.  You develop new symptoms.  You have a general ill feeling (malaise) with muscle aches and pains. Get help right away if:  Your symptoms get worse.  You feel very sleepy.  You develop vomiting or diarrhea that persists.  You notice red streaks coming from the infected area.  Your red area gets larger or  turns dark in color. These symptoms may represent a serious problem that is an emergency. Do not wait to see if the symptoms will go away. Get medical help right away. Call your local emergency services (911 in the [place] Do not drive yourself to the hospital. Summary  Cellulitis is a skin infection. This condition occurs most often in the arms and lower legs.  Treatment for this condition may include medicines, such as antibiotic medicines or  antihistamines.  Take over-the-counter and prescription medicines only as told by your health care provider. If you were prescribed an antibiotic medicine, do not stop taking the antibiotic even if you start to feel better.  Contact a health care provider if your symptoms do not begin to improve within 1-2 days of starting treatment or your symptoms get worse.  Keep all follow-up visits as told by your health care provider. This is important. These visits let your health care provider make sure that a more serious infection is not developing. This information is not intended to replace advice given to you by your health care provider. Make sure you discuss any questions you have with your health care provider. Document Revised: 10/14/2019 Document Reviewed: 02/22/2018 Elsevier Patient Education  2021 [employer].

## 2021-02-10 ENCOUNTER — Telehealth: Payer: Self-pay | Admitting: Family Medicine

## 2021-02-10 DIAGNOSIS — F0390 Unspecified dementia without behavioral disturbance: Secondary | ICD-10-CM | POA: Insufficient documentation

## 2021-02-10 DIAGNOSIS — M109 Gout, unspecified: Secondary | ICD-10-CM | POA: Insufficient documentation

## 2021-02-10 DIAGNOSIS — R531 Weakness: Secondary | ICD-10-CM | POA: Insufficient documentation

## 2021-02-10 DIAGNOSIS — M79672 Pain in left foot: Secondary | ICD-10-CM | POA: Insufficient documentation

## 2021-02-10 DIAGNOSIS — N390 Urinary tract infection, site not specified: Secondary | ICD-10-CM | POA: Insufficient documentation

## 2021-02-10 DIAGNOSIS — L0390 Cellulitis, unspecified: Secondary | ICD-10-CM | POA: Insufficient documentation

## 2021-02-10 LAB — URINE CULTURE
MICRO NUMBER:: 11814990
Result:: NO GROWTH
SPECIMEN QUALITY:: ADEQUATE

## 2021-02-10 NOTE — Assessment & Plan Note (Signed)
She has just been released from the hospital and is now on Furosemide 60 mg daily and appears to be tolerating it. cmp shows stable renal function

## 2021-02-10 NOTE — Assessment & Plan Note (Signed)
Uric acid level returned notably elevated. Encouraged increased hydration and avoid offending foods. Will give a 1 day course of Colchicine due to her renal functions. 0.6 mg po bid x 1 day and reassess.

## 2021-02-10 NOTE — Assessment & Plan Note (Signed)
Left foot red, swollen and painful consider cellulitis and will provide a short course of antibiotics to see if she has improvement

## 2021-02-10 NOTE — Assessment & Plan Note (Signed)
Continue Furosemide and encouraged to elevate feet above heart at least 15 minutes tid

## 2021-02-10 NOTE — Telephone Encounter (Signed)
Brandon with [facility] is Requesting OT 1 time a week for  6 weeks.Vicente Serene to leave message

## 2021-02-10 NOTE — Assessment & Plan Note (Signed)
Hydrate and monitor 

## 2021-02-10 NOTE — Assessment & Plan Note (Signed)
Set up home PT/OT evaluation to treat

## 2021-02-10 NOTE — Assessment & Plan Note (Addendum)
Patient has gotten to the place where she cannot care for herself and her family all needs to keep working so they are unable to care for her 24 hours a day. They feel they are ready to have her placed where she can be cared for in a facility.  We will place a home health referral to help with with a nursing eval, a PT and OT evaluation while they start looking at homes to decide where they will place her and then the home will supply them with an admission packet we can fill out. Her mood is diminished will increase her Sertraline to 50 mg daily to see if that helps and daughter notes that in the hospital they used Haldol with goo results. She is given a small amount of Haldol to use if agitation occurs. Spent 45 minutes with patient and daughter discussing recent history and plan of care.

## 2021-02-10 NOTE — Assessment & Plan Note (Signed)
Refills given on Diltiazem and Xarelto until she can get inf or follow up at cardiology

## 2021-02-10 NOTE — Assessment & Plan Note (Signed)
Well controlled, no changes to meds. Encouraged heart healthy diet such as the DASH diet and exercise as tolerated.  °

## 2021-02-11 ENCOUNTER — Ambulatory Visit (INDEPENDENT_AMBULATORY_CARE_PROVIDER_SITE_OTHER): Payer: Medicare Other

## 2021-02-11 DIAGNOSIS — I10 Essential (primary) hypertension: Secondary | ICD-10-CM

## 2021-02-11 DIAGNOSIS — I509 Heart failure, unspecified: Secondary | ICD-10-CM | POA: Diagnosis not present

## 2021-02-11 DIAGNOSIS — I480 Paroxysmal atrial fibrillation: Secondary | ICD-10-CM

## 2021-02-11 DIAGNOSIS — I5033 Acute on chronic diastolic (congestive) heart failure: Secondary | ICD-10-CM

## 2021-02-11 NOTE — Telephone Encounter (Signed)
Yes please give VO as requested °

## 2021-02-11 NOTE — Telephone Encounter (Signed)
Orders given to [facility].

## 2021-02-11 NOTE — Patient Instructions (Signed)
Visit Information: Thank you for taking the time to speak with me today.  PATIENT GOALS: Goals Addressed            This Visit's Progress   . Quality of Life Maintained/Improved       Timeframe:  Long-Range Goal Priority:  High Start Date:   01/07/21                          Expected End Date:   04/08/21                    Follow up date: 02/18/21  Patient Goals: . Continue to take medications as prescribed . Continue to attend all scheduled provider appointments . Continue to call pharmacy for medication refills . Provide meaningful experiences spending outdoor time or time in Yard/Garden and outings as tolerated.  . Continue to maintain Fall prevention strategies: keep walkways clear, limit or take out throw rugs (could easily become trip hazards), keep good lighting in the home, keep a flashlight near your bed, arrange solid pieces of furniture so that there may be a place to rest throughout the house if needed; change positions slowly. Grab bars in shower and/or next to toilet are useful. Avoid walking around barefoot and  don't walk around in slippers, stocking or socks. Instead wear low-heeled, comfortable shoes with rubber soles. Contact provider if you have fallen, even if there was no injury. . Work with home health agency staff as scheduled.  . Patient will call provider office for new concerns or questions . Work with Child psychotherapist as needed    . Track and Manage cardiac signs/symptoms   Not on track    Timeframe:  Long-Range Goal Priority:  High Start Date:  01/07/21                         Expected End Date:  04/08/21                     Follow Up Date 02/18/21    - know when to call the doctor: increased weight gain more than 3 pounds overnight or 5 pounds in a week, increased swelling in stomach, hands, feet or legs, increased shortness of breath, if it is harder for you to breathe when lying down and need to sit up to breath or you feel that something is not right. New or  worsening dizziness, racing or irregular heartbeat that may be uncomfortable -attend scheduled cardiologist appointment: currently scheduled for Feb 16, 2021 at 11:45 am.  -call provider for questions or concerns. -Elevate legs at least 15 minutes three times a day. -eat healthy: low salt, whole grains, fruits and vegetables, lean meats and healthy fats. May eat smaller portions (4-6 times a day) if needed to get nutrients in.  -Attend scheduled cardiologist appointment: currently scheduled for Feb 16, 2021 at 11:45 am.  -Take medications as prescribed   Why is this important?    You will be able to handle your symptoms better if you keep track of them.   Making some simple changes to your lifestyle will help.   Eating healthy is one thing you can do to take good care of yourself.    Notes:        Patient verbalizes understanding of instructions provided today and agrees to view in MyChart.   Telephone follow up appointment with care management team member scheduled  for: Feb 18, 2021 The patient has been provided with contact information for the care management team and has been advised to call with any health related questions or concerns.   Kathyrn Sheriff, RN, MSN, BSN, CCM Care Management Coordinator [facility] [phone]

## 2021-02-11 NOTE — Chronic Care Management (AMB) (Signed)
Chronic Care Management   CCM RN Visit Note  02/11/2021 Name: Belinda Anderson MRN: [id number] DOB: 04-11-1932  Subjective: Belinda Anderson is a 87 y.o. year old female who is a primary care patient of Belinda Anderson, Belinda R, DO. The care management team was consulted for assistance with disease management and care coordination needs.    Engaged with patient by telephone for follow up visit in response to provider referral for case management and/or care coordination services.   Consent to Services:  The patient was given information about Chronic Care Management services, agreed to services, and gave verbal consent prior to initiation of services.  Please see initial visit note for detailed documentation.   Patient agreed to services and verbal consent obtained.   Assessment: Review of patient past medical history, allergies, medications, health status, including review of consultants reports, laboratory and other test data, was performed as part of comprehensive evaluation and provision of chronic care management services.   SDOH (Social Determinants of Health) assessments and interventions performed:    CCM Care Plan  No Known Allergies  Outpatient Encounter Medications as of 02/11/2021  Medication Sig  . acetaminophen (TYLENOL) 650 MG CR tablet Take 1,300 mg by mouth daily as needed for pain (arthritis).   . cephALEXin (KEFLEX) 500 MG capsule Take 1 capsule (500 mg total) by mouth 3 (three) times daily.  . colchicine 0.6 MG tablet Take 1 tablet (0.6 mg total) by mouth 2 (two) times daily.  . diclofenac Sodium (VOLTAREN) 1 % GEL Apply 2 g topically daily as needed (pain).  Marland Kitchen. diltiazem (CARDIZEM CD) 180 MG 24 hr capsule TAKE 1 CAPSULE(180 MG) BY MOUTH DAILY  . furosemide (LASIX) 20 MG tablet Take 3 tablets (60 mg total) by mouth daily.  . Glycerin-Hypromellose-PEG 400 ([facility] DRY EYE RELIEF) 0.2-0.2-1 % SOLN Place 1 drop into both eyes daily as needed (Dry eye).  . haloperidol (HALDOL) 1  MG tablet Take 1 tablet (1 mg total) by mouth daily as needed for agitation.  Marland Kitchen. levothyroxine (SYNTHROID) 75 MCG tablet Take 1 tablet (75 mcg total) by mouth daily before breakfast.  . NONFORMULARY OR COMPOUNDED ITEM rollator walker  #1  As directed -------  Dx  CHF,  DOE,  OA  . potassium chloride SA (KLOR-CON) 20 MEQ tablet Take 2 tablets (40 mEq total) by mouth daily.  . psyllium (METAMUCIL) 0.52 g capsule Take 0.52 g by mouth daily.  . Rivaroxaban (XARELTO) 15 MG TABS tablet TAKE 1 TABLET(15 MG) BY MOUTH DAILY WITH BREAKFAST DISCONTINUE ELIQUIS  . sertraline (ZOLOFT) 50 MG tablet Take 1 tablet (50 mg total) by mouth daily.   No facility-administered encounter medications on file as of 02/11/2021.    Patient Active Problem List   Diagnosis Date Noted  . Left foot pain 02/10/2021  . Weakness 02/10/2021  . Urinary tract infection without hematuria 02/10/2021  . Cellulitis 02/10/2021  . Dementia (HCC) 02/10/2021  . Gout 02/10/2021  . SOB (shortness of breath) 02/05/2021  . CKD (chronic kidney disease), stage III (HCC) 02/05/2021  . Anxiety 12/24/2020  . Impacted cerumen of right ear 12/24/2020  . Acute on chronic diastolic CHF (congestive heart failure) (HCC) 11/26/2020  . Bilateral leg edema 01/30/2020  . COVID-19 virus infection 11/21/2019  . Cough 11/21/2019  . External hemorrhoid, bleeding 10/12/2019  . AF (paroxysmal atrial fibrillation) (HCC) 10/02/2019  . 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 (HCC) s/p IR Anderson MCA M2 09/30/2019  . Middle cerebral artery embolism, right  09/30/2019  . Contusion of foot including toes, right, initial encounter 08/10/2018  . Primary osteoarthritis of right knee 07/31/2018  . Right leg swelling 07/17/2018  . Pain of right calf 07/17/2018  . Preventative health care 09/18/2017  . Low back pain radiating to left leg 11/28/2016  . Hyperlipidemia 02/02/2015  . Sciatica 07/16/2014  . Knee pain, acute 07/16/2014  . Lower extremity  pain, right 07/16/2014  . Skin infection 07/16/2014  . Incontinence of urine in female 06/19/2013  . Cerebral aneurysm rupture (HCC) 04/08/2013  . LEG EDEMA, RIGHT 07/06/2010  . OTHER DRUG ALLERGY 07/06/2010  . DIARRHEA 02/12/2010  . HEMATURIA, HX OF 11/24/2009  . ANEURYSM, HX OF 08/03/2009  . B12 deficiency 07/24/2009  . 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 SYNCOPE 07/20/2009  . DIZZINESS 07/20/2009  . Memory loss 07/20/2009  . 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 Hypothyroidism 12/14/2007  . Essential hypertension 12/14/2007  . DIVERTICULITIS, HX OF 12/14/2007  . GOITER 01/30/2007  . HYPERTENSION, BENIGN 01/30/2007  . UTERINE POLYP 01/30/2007  . THYROIDECTOMY, HX OF 01/30/2007    Conditions to be addressed/monitored: CHF, A-fib, HTN, Anxiety, Status Post Hospitalization 02/04/21-02/08/21  Care Plan : Cardiovascular Disease  Updates made by Belinda Maryland, RN since 02/11/2021 12:00 AM    Problem: Symptom managment of cardivascular disease   Priority: High    Long-Range Goal: Symptom Exacerbation Prevented or Minimized   Start Date: 01/07/2021  Expected End Date: 04/08/2021  This Visit's Progress: Not on track  Recent Progress: On track  Priority: High  Note:   Current Barriers:  Marland Kitchen Knowledge deficit related to long term care plan management of cardiovascular disease. Admission 02/04/21-02/08/21 with acute on chronic CHF, A-fib, UTI, CKD stage III, dementia. Declined SNF placement. Discharged home with [facility], nursing/PT/OT/bath Nature conservation officer.  Spoke with patient's daughter, Belinda Anderson designated party release/Primary care giver. Office visit with primary care on 02/09/21: client being treated for cellulitis left foot and gout. Ms. Belinda Anderson reports client is requiring more care at this time. She has been contacted by home health agency. She reports that she has also set up personal care service. She is also looking into skilled  facility placement. She is awaiting a call from home health social worker. However is receptive to support and discussing the process with embedded clinic social worker. She reports with assistance, client is weighing daily. And states client is requiring a lot more care, but is better than prior to admission.  Case Manager Clinical Goal(s):  Marland Kitchen Patient/caregiver will verbalize understanding of management of cardiovascular disease and when to call doctor . Patient will take medications as prescribed. . Patient/caregiver will have appropriate community resources/support Interventions:  . Collaboration with [name], Grayling Congress, DO regarding development and update of comprehensive plan of care as evidenced by provider attestation and co-signature . Inter-disciplinary care team collaboration (see longitudinal plan of care) . Reiterated signs/symptoms of exacerbation and when to call the doctor. . Discussed scales/weights. Confirmed client is weighing daily . Medications Reviewed . RNCM called and scheduled follow up hospital visit with cardiology office for 02/16/21 at 11:45 am.  . Clinic embedded social work referral re: SNF placement process, support/assistance as needed . Discussed diuretics and need to ensure integrity of client's skin. Patient Goals: - know when to call the doctor: increased weight gain more than 3 pounds overnight or 5 pounds in a week, increased swelling in stomach, hands, feet or legs, increased shortness of breath, if it  is harder for you to breathe when lying down and need to sit up to breath or you feel that something is not right. New or worsening dizziness, racing or irregular heartbeat that may be uncomfortable -attend scheduled cardiologist appointment: currently scheduled for Feb 16, 2021 at 11:45 am.  -call provider for questions or concerns. -Elevate legs at least 15 minutes three times a day. -eat healthy: low salt, whole grains, fruits and vegetables, lean meats and  healthy fats. May eat smaller portions (4-6 times a day) if needed to get nutrients in.  -Attend scheduled cardiologist appointment: currently scheduled for Feb 16, 2021 at 11:45 am.  -Take medications as prescribed  Follow Up Plan: Telephone follow up appointment with care management team member scheduled for: Feb 18, 2021 The patient has been provided with contact information for the care management team and has been advised to call with any health related questions or concerns.      Care Plan : Anxiety  Updates made by Belinda Maryland, RN since 02/11/2021 12:00 AM    Problem: Quality of life affected by anxiety in patient with Chronic condition: Heart failure, atrial fibrillation,memory loss   Priority: High    Long-Range Goal: Anxiety Symptoms Monitored and Managed   Start Date: 01/07/2021  Expected End Date: 04/08/2021  This Visit's Progress: On track  Recent Progress: On track  Priority: High  Note:   Current Barriers:  . Long term care plan for management of Quality of Life. Ms. Belinda Anderson is primary caregiver and assist with managing clients health care needs. She reports Mrs. Grawe other children are also supportive. Recent admission due to CHF exacerbation; afib; Dementia. Other problems: UTI. Currently being treated. Currently, receiving around the clock care. Personal care services started yesterday. Also active with home health. Office visit completed 02/09/21. Daughter reports Zoloft increased at last provider visit and reports client is taking Zoloft as prescribed.   . Chronic Disease Management support and education needs related to disease processes, available community resources. . Unable to independently prepare hot meals and manage medication and other healthcare needs.  Nurse Case Manager Clinical Goal(s):  Marland Kitchen Patient/caregiver will verbalize understanding of plan for management of health, anxiety and disease processes . Patient/caregiver will attend scheduled medical  appointments   Interventions:  . 1:1 collaboration with [name], Grayling Congress, DO regarding development and update of comprehensive plan of care as evidenced by provider attestation and co-signature . Inter-disciplinary care team collaboration (see longitudinal plan of care) . Evaluation of current treatment plan related to disease processes and patient's adherence to plan as established by provider. . Encouraged daughter to continue to  attend provider visits as scheduled, contact provider for any new or worsening symptoms. . Reviewed medications . Discussed plans with patient for ongoing care management follow up and provided patient with direct contact information for care management team . Referral embedded clinic social worker.  Patient Goals/Self-Care Activities . Continue to take medications as prescribed . Continue to attend all scheduled provider appointments . Continue to call pharmacy for medication refills . Provide meaningful experiences spending outdoor time or time in Yard/Garden and outings as tolerated.  . Continue to maintain Fall prevention strategies: keep walkways clear, limit or take out throw rugs (could easily become trip hazards), keep good lighting in the home, keep a flashlight near your bed, arrange solid pieces of furniture so that there may be a place to rest throughout the house if needed; change positions slowly. Grab bars in shower and/or  next to toilet are useful. Avoid walking around barefoot and  don't walk around in slippers, stocking or socks. Instead wear low-heeled, comfortable shoes with rubber soles. Contact provider if you have fallen, even if there was no injury. . Work with home health agency staff as scheduled.  . Patient will call provider office for new concerns or questions . Work with Child psychotherapist as needed   Follow Up Plan: Telephone follow up appointment with care management team member scheduled for: Feb 18, 2021 The patient has been  provided with contact information for the care management team and has been advised to call with any health related questions or concerns.        Plan:The patient has been provided with contact information for the care management team and has been advised to call with any health related questions or concerns.  and The care management team will reach out to the patient again over the next 7-10 days.  Kathyrn Sheriff, RN, MSN, BSN, CCM Care Management Coordinator [facility] [phone]

## 2021-02-14 NOTE — Progress Notes (Signed)
Cardiology Office Note Date:  02/14/2021  Patient ID:  Belinda Anderson, Belinda Anderson 1932/06/05, MRN [id number] PCP:  [name], Grayling Congress, DO  Electrophysiologist: Dr. Elberta Fortis    Chief Complaint: post hospital  History of Present Illness: Belinda Anderson is a 87 y.o. female with history of HTN, HLD, hypothyroid,  stroke, Afib, chronic CHf (diastolic), CKD (III), depression, dementia.  She comes in today to be seen for Dr. Elberta Fortis, last seen by him Jan 2022.  ERAF s/p DCCV.  Suspected to be volume OL with c/o SOB, planned for labs and adjustments to her meds.  She saw A. Glory Buff, NP in follow up 11/05/20, edema was improved with the additional lasix, planned to continue 60mg  daily with ongoing volume OL, discussed perhaps anxiety as a component of her SOB as well and rec follow up with PMD  She was admitted to [facility] 02/04/21 with acute/chronic CHF manged with the IM team. BNP of 306, troponin negative x 2. EKG shows A. Fib, rate controlled no ST-T wave changes. She was admitted for acute on chronic diastolic heart failure exacerbation. She is also found to have a UTI and started on IV ceftriaxone. Patient continued to have clinical improvement, Lasix deescalated to home dosing. UTI treated. Her mentation returned to her baseline dementia.  Discharged 02/08/21  She saw Dr. 02/10/21 the day after admission, noting family having increasing difficulty in caring for the pt at home, needs to consider SNF.   L foot was painful and swollen planned for abx for cellulitis.  Plans for home health RN, PT, OT eval with steps towards placement  TODAY She is accompanied by her daughter today PT has helped, walking with a walker, doing a bit better. She sleeps in bed with one pillow, no reports of symptoms of PND or orthopnea by the pt or her daughter though her daughter says her sister reports that in the morning she has a congested cough that will clear All in all breathing is much better, edema and abd bloating are  much better as well Gained 1.5lbs from yesterday L foot erythema and swelling is resolved to her baseline, reports ultimately was gout and treated with colchicine and the antibiotic with some diarrhea resulting Both completed now and hopefully bowel regime will return to normal No near syncope or syncope  No bleeding or signs of bleeding  They have a CNA during the day to help along with family and family is with her nights    Past Medical History:  Diagnosis Date  . Arthritis   . Atrial fibrillation (HCC)   . Hypertension   . Thyroid disease   . TIA (transient ischemic attack)     Past Surgical History:  Procedure Laterality Date  . CARDIOVERSION N/A 04/15/2020   Procedure: CARDIOVERSION;  Surgeon: 04/17/2020, MD;  Location: [facility] ENDOSCOPY;  Service: Cardiovascular;  Laterality: N/A;  . EYE SURGERY     lens implant  . IR ANGIO VERTEBRAL SEL SUBCLAVIAN INNOMINATE UNI R MOD SED  09/30/2019  . IR CT HEAD LTD  09/30/2019  . IR PERCUTANEOUS ART THROMBECTOMY/INFUSION INTRACRANIAL INC DIAG ANGIO  09/30/2019  . RADIOLOGY WITH ANESTHESIA N/A 09/30/2019   Procedure: IR WITH ANESTHESIA;  Surgeon: 10/02/2019, MD;  Location: [facility] OR;  Service: Radiology;  Laterality: N/A;  . TEE WITHOUT CARDIOVERSION N/A 04/15/2020   Procedure: TRANSESOPHAGEAL ECHOCARDIOGRAM (TEE);  Surgeon: 04/17/2020, MD;  Location: [facility] ENDOSCOPY;  Service: Cardiovascular;  Laterality: N/A;    Current Outpatient Medications  Medication Sig Dispense Refill  . acetaminophen (TYLENOL) 650 MG CR tablet Take 1,300 mg by mouth daily as needed for pain (arthritis).     . cephALEXin (KEFLEX) 500 MG capsule Take 1 capsule (500 mg total) by mouth 3 (three) times daily. 21 capsule 0  . colchicine 0.6 MG tablet Take 1 tablet (0.6 mg total) by mouth 2 (two) times daily. 2 tablet 0  . diclofenac Sodium (VOLTAREN) 1 % GEL Apply 2 g topically daily as needed (pain). 150 g 3  . diltiazem (CARDIZEM CD) 180 MG 24 hr  capsule TAKE 1 CAPSULE(180 MG) BY MOUTH DAILY 90 capsule 0  . furosemide (LASIX) 20 MG tablet Take 3 tablets (60 mg total) by mouth daily.    . Glycerin-Hypromellose-PEG 400 ([employer] DRY EYE RELIEF) 0.2-0.2-1 % SOLN Place 1 drop into both eyes daily as needed (Dry eye).    . haloperidol (HALDOL) 1 MG tablet Take 1 tablet (1 mg total) by mouth daily as needed for agitation. 10 tablet 0  . levothyroxine (SYNTHROID) 75 MCG tablet Take 1 tablet (75 mcg total) by mouth daily before breakfast. 90 tablet 0  . NONFORMULARY OR COMPOUNDED ITEM rollator walker  #1  As directed -------  Dx  CHF,  DOE,  OA 1 each 0  . potassium chloride SA (KLOR-CON) 20 MEQ tablet Take 2 tablets (40 mEq total) by mouth daily. 90 tablet 1  . psyllium (METAMUCIL) 0.52 g capsule Take 0.52 g by mouth daily.    . Rivaroxaban (XARELTO) 15 MG TABS tablet TAKE 1 TABLET(15 MG) BY MOUTH DAILY WITH BREAKFAST DISCONTINUE ELIQUIS 90 tablet 0  . sertraline (ZOLOFT) 50 MG tablet Take 1 tablet (50 mg total) by mouth daily. 90 tablet 0   No current facility-administered medications for this visit.    Allergies:   Patient has no known allergies.   Social History:  The patient  reports that she has never smoked. She has never used smokeless tobacco. She reports that she does not drink alcohol and does not use drugs.   Family History:  The patient's family history includes Arthritis in her mother; Coronary artery disease in an other family member; Diabetes in her brother; Heart disease in her mother; Heart disease (age of onset: 46) in her father.  ROS:  Please see the history of present illness.    All other systems are reviewed and otherwise negative.   PHYSICAL EXAM:  VS:  There were no vitals taken for this visit. BMI: There is no height or weight on file to calculate BMI. Well nourished, well developed, in no acute distress HEENT: normocephalic, atraumatic Neck: no JVD, carotid bruits or masses Cardiac:  irreg-irreg; no significant  murmurs, no rubs, or gallops Lungs:  CTA b/l, no wheezing, rhonchi or rales Abd: soft, nontender MS: no deformity, age appropriate atrophy Ext: trace-1+ edema b/l,  Support stockings Skin: warm and dry, no rash Neuro:  No gross deficits appreciated Psych: euthymic mood, full affect   EKG:  Done today and reviewed by myself  AFib 69bpm, RBBB  02/05/21: TTE IMPRESSIONS  1. Left ventricular systolic function is normal. EF 55-60%. No regional  wall motion abnormalities. There is mild focal hypertrophy of the basilar  septum. Diastolic function is indeterminate due to atrial fibrillation.  Findings suggestive of restrictive  cardiomyopathy.  2. Right ventricular systolic function is mildly reduced. The right  ventricular size is moderately enlarged. There is normal pulmonary artery  systolic pressure.  3. Left atrial size was severely  dilated. (59mm) 4. Right atrial size was severely dilated.  5. The mitral valve is normal in structure. Mild mitral valve  regurgitation. No evidence of mitral stenosis.  6. Tricuspid valve regurgitation is severe.  7. The aortic valve is tricuspid. There is moderate calcification of the  aortic valve. Aortic valve regurgitation is not visualized. No aortic  stenosis is present.  8. The inferior vena cava is normal in size with greater than 50%  respiratory variability, suggesting right atrial pressure of 3 mmHg.    Recent Labs: 11/26/2020: Pro B Natriuretic peptide (BNP) 411.0 02/04/2021: B Natriuretic Peptide 305.9 02/05/2021: Magnesium 2.0; TSH 1.738 02/09/2021: ALT 13; BUN 29; Creatinine, Ser 1.52; Hemoglobin 12.7; Platelets 279.0; Potassium 4.0; Sodium 138  No results found for requested labs within last 8760 hours.   Estimated Creatinine Clearance: 25.8 mL/min (A) (by C-G formula based on SCr of 1.52 mg/dL (H)).   Wt Readings from Last 3 Encounters:  02/08/21 151 lb 14.4 oz (68.9 kg)  11/05/20 154 lb 12.8 oz (70.2 kg)  10/22/20 156 lb  (70.8 kg)     Other studies reviewed: Additional studies/records reviewed today include: summarized above  ASSESSMENT AND PLAN:  1. Permanent AFib     CHAD2DS2Vasc is 7, on xarelto, appropriately dosed at 15mg  daily     LA described as severely dilated, measured 44mm     Rate appears controlled  2. Chronic CHF (diastolic, ?restrictive) 3. Mild RV failue, dilated RV 4. VHD, severe TR     Recent acute exacerbation     Discussed daily weights (they are doing this) If increased 3lbs in a day or 5 in a week to change from 60mg  daily to 40mg  BID for 3 days Will have her do this the next 2 days If they find a need to use extra lasic regularly to let 58m know  BMET today given diarrhea and plans for a couple days of extra lasix  5. HTN     A bit low, though no symptoms     follow  Disposition: F/u with in 2 weeks for close follow up given recent acute HF exacerbation  Current medicines are reviewed at length with the patient today.  The patient did not have any concerns regarding medicines.  , PA-C 02/14/2021 5:52 PM     [facility] [address] [place] 04/16/2021 [place] [phone] (office)  [phone] (fax)

## 2021-02-16 ENCOUNTER — Encounter: Payer: Self-pay | Admitting: Physician Assistant

## 2021-02-16 ENCOUNTER — Other Ambulatory Visit: Payer: Self-pay

## 2021-02-16 ENCOUNTER — Ambulatory Visit: Payer: Medicare Other | Admitting: Physician Assistant

## 2021-02-16 VITALS — BP 100/72 | HR 46 | Ht 64.0 in | Wt 157.2 lb

## 2021-02-16 DIAGNOSIS — I4821 Permanent atrial fibrillation: Secondary | ICD-10-CM

## 2021-02-16 DIAGNOSIS — I5032 Chronic diastolic (congestive) heart failure: Secondary | ICD-10-CM

## 2021-02-16 DIAGNOSIS — I10 Essential (primary) hypertension: Secondary | ICD-10-CM

## 2021-02-16 NOTE — Patient Instructions (Addendum)
Medication Instructions:  FOR ANY WEIGHT GAIN OF 3 LBS IN A DAY OR 5 LBS  IN A WEEK   FOR THREE DAYS ONLY:   TAKE FUROSEMIDE 40 MG TWICE A DAY  THEN RESUME BACK TO  FUROSEMIDE 20 MG ( 3 TABLETS) ONCE A DAY   *If you need a refill on your cardiac medications before your next appointment, please call your pharmacy*   Lab Work:  BMET TODAY   If you have labs (blood work) drawn today and your tests are completely normal, you will receive your results only by: Marland Kitchen MyChart Message (if you have MyChart) OR . A paper copy in the mail If you have any lab test that is abnormal or we need to change your treatment, we will call you to review the results.   Testing/Procedures: NONE ORDERED  TODAY    Follow-Up: At [facility], you and your health needs are our priority.  As part of our continuing mission to provide you with exceptional heart care, we have created designated Provider Care Teams.  These Care Teams include your primary Cardiologist (physician) and Advanced Practice Providers (APPs -  Physician Assistants and Nurse Practitioners) who all work together to provide you with the care you need, when you need it.  We recommend signing up for the patient portal called "MyChart".  Sign up information is provided on this After Visit Summary.  MyChart is used to connect with patients for Virtual Visits (Telemedicine).  Patients are able to view lab/test results, encounter notes, upcoming appointments, etc.  Non-urgent messages can be sent to your provider as well.   To learn more about what you can do with MyChart, go to [email or web address].    Your next appointment:   2 week(s)   The format for your next appointment:   In Person  Provider:   Loman [place], MD or Francis Dowse PA-C    Other Instructions

## 2021-02-17 LAB — BASIC METABOLIC PANEL
BUN/Creatinine Ratio: 14 (ref 12–28)
BUN: 21 mg/dL (ref 8–27)
CO2: 20 mmol/L (ref 20–29)
Calcium: 9 mg/dL (ref 8.7–10.3)
Chloride: 104 mmol/L (ref 96–106)
Creatinine, Ser: 1.49 mg/dL — ABNORMAL HIGH (ref 0.57–1.00)
Glucose: 98 mg/dL (ref 65–99)
Potassium: 4.9 mmol/L (ref 3.5–5.2)
Sodium: 140 mmol/L (ref 134–144)
eGFR: 34 mL/min/{1.73_m2} — ABNORMAL LOW (ref >59)

## 2021-02-18 ENCOUNTER — Telehealth: Payer: Medicare Other

## 2021-02-18 ENCOUNTER — Telehealth: Payer: Self-pay

## 2021-02-18 NOTE — Addendum Note (Signed)
Addended by: Oleta Mouse on: 02/18/2021 10:44 AM   Modules accepted: Orders

## 2021-02-18 NOTE — Telephone Encounter (Signed)
  Chronic Care Management   Outreach Note  02/18/2021 Name: Belinda Anderson MRN: [id number] DOB: 23-Jun-1932  Referred by: [name], DO Reason for referral : Chronic Care Management (RNCM follow up)   An unsuccessful telephone outreach was attempted today. The patient was referred to the case management team for assistance with care management and care coordination.   Follow Up Plan: A HIPAA compliant phone message was left for the patient providing contact information and requesting a return call.  The care management team will reach out to the patient again over the next 30 days.   Kathyrn Sheriff, RN, MSN, BSN, CCM Care Management Coordinator [facility] [phone]

## 2021-02-24 ENCOUNTER — Encounter: Payer: Self-pay | Admitting: Family Medicine

## 2021-02-25 ENCOUNTER — Telehealth: Payer: Self-pay

## 2021-02-25 NOTE — Telephone Encounter (Signed)
Called back and lvm with below info

## 2021-02-25 NOTE — Telephone Encounter (Signed)
Received call from [place], PT at [facility]- wanting to report that Pt has a 3-4lb weight gain this week, Pt currently on Lasix 60mg  daily, Pt's daughter is planning to give an extra 20mg  of Lasix tomorrow as directed by providers previously. Beth did mention that lung sounds in L lower lobe were abnormal, wanted to know if provider wanted to order a mobile x-ray to r/o pneumonia. Pt does having nursing coming out as well but they are not due to come until next week.   Beth can be reached at [phone]

## 2021-02-25 NOTE — Telephone Encounter (Signed)
Ok to order cxr but should not wait until next week They should also call cardiology and let them know  Pt has hx chf

## 2021-02-28 NOTE — Progress Notes (Signed)
Cardiology Office Note Date:  03/01/2021  Patient ID:  Belinda Anderson 1932-05-01, MRN [id number] PCP:  [name], Grayling Congress, DO  Electrophysiologist: Dr. Elberta Fortis    Chief Complaint:  Planned follow up  History of Present Illness: Belinda Anderson is a 87 y.o. female with history of HTN, HLD, hypothyroid,  stroke, Afib, chronic CHF (diastolic), CKD (III), depression, dementia.  She comes in today to be seen for Dr. Elberta Fortis, last seen by him Jan 2022.  ERAF s/p DCCV.  Suspected to be volume OL with c/o SOB, planned for labs and adjustments to her meds.  She saw A. Glory Buff, NP in follow up 11/05/20, edema was improved with the additional lasix, planned to continue 60mg  daily with ongoing volume OL, discussed perhaps anxiety as a component of her SOB as well and rec follow up with PMD  She was admitted to [facility] 02/04/21 with acute/chronic CHF manged with the IM team. BNP of 306, troponin negative x 2. EKG shows A. Fib, rate controlled no ST-T wave changes. She was admitted for acute on chronic diastolic heart failure exacerbation. She is also found to have a UTI and started on IV ceftriaxone. Patient continued to have clinical improvement, Lasix deescalated to home dosing. UTI treated. Her mentation returned to her baseline dementia.  Discharged 02/08/21  She saw Dr. 02/10/21 the day after admission, noting family having increasing difficulty in caring for the pt at home, needs to consider SNF.   L foot was painful and swollen planned for abx for cellulitis.  Plans for home health RN, PT, OT eval with steps towards placement  I saw her 02/16/21 She is accompanied by her daughter today PT has helped, walking with a walker, doing a bit better. She sleeps in bed with one pillow, no reports of symptoms of PND or orthopnea by the pt or her daughter though her daughter says her sister reports that in the morning she has a congested cough that will clear All in all breathing is much better, edema and  abd bloating are much better as well Gained 1.5lbs from yesterday L foot erythema and swelling is resolved to her baseline, reports ultimately was gout and treated with colchicine and the antibiotic with some diarrhea resulting Both completed now and hopefully bowel regime will return to normal No near syncope or syncope No bleeding or signs of bleeding They have a CNA during the day to help along with family and family is with her nights We reviewed daily weights, instructions on use of a PRN lasix dose, planned for 2 days of this with some weight gain and trace edema at her visit, and another early follow visit. BMET this day was stable   TODAY She is again accompanied by her daughter Generally about the same. Her weight at home fluctuating, will gain 3 pounds in a day or so the extra lasix helps and comes back down though within a few days again starts to creep back up. They are quite diligent about low sodium food for her Her PT last week noted crackles in L lung and PMD had a CXR done though unknown result She has SOB intermittently her daughter suspects may be a component of anxiety as well. Today gave her a Haldol and helps her. No CP, palpitations or cardiac awareness are reported No near fainting or fainting Sleep is interrupted with need to urinate, no reports of symptoms of PND or orthopnea No bleeding or signs of bleeding  She has excellent family  support and an aid to help them at home as well.  Is living with her other daughter now.   Past Medical History:  Diagnosis Date  . Arthritis   . Atrial fibrillation (HCC)   . Hypertension   . Thyroid disease   . TIA (transient ischemic attack)     Past Surgical History:  Procedure Laterality Date  . CARDIOVERSION N/A 04/15/2020   Procedure: CARDIOVERSION;  Surgeon: Parke Poisson, MD;  Location: [facility] ENDOSCOPY;  Service: Cardiovascular;  Laterality: N/A;  . EYE SURGERY     lens implant  . IR ANGIO VERTEBRAL SEL  SUBCLAVIAN INNOMINATE UNI R MOD SED  09/30/2019  . IR CT HEAD LTD  09/30/2019  . IR PERCUTANEOUS ART THROMBECTOMY/INFUSION INTRACRANIAL INC DIAG ANGIO  09/30/2019  . RADIOLOGY WITH ANESTHESIA N/A 09/30/2019   Procedure: IR WITH ANESTHESIA;  Surgeon: Julieanne Cotton, MD;  Location: [facility] OR;  Service: Radiology;  Laterality: N/A;  . TEE WITHOUT CARDIOVERSION N/A 04/15/2020   Procedure: TRANSESOPHAGEAL ECHOCARDIOGRAM (TEE);  Surgeon: Parke Poisson, MD;  Location: [facility] ENDOSCOPY;  Service: Cardiovascular;  Laterality: N/A;    Current Outpatient Medications  Medication Sig Dispense Refill  . acetaminophen (TYLENOL) 650 MG CR tablet Take 1,300 mg by mouth daily as needed for pain (arthritis).     Marland Kitchen diclofenac Sodium (VOLTAREN) 1 % GEL Apply 2 g topically daily as needed (pain). 150 g 3  . diltiazem (CARDIZEM CD) 180 MG 24 hr capsule TAKE 1 CAPSULE(180 MG) BY MOUTH DAILY 90 capsule 0  . furosemide (LASIX) 20 MG tablet Take 3 tablets (60 mg total) by mouth daily.    . Glycerin-Hypromellose-PEG 400 ([employer] DRY EYE RELIEF) 0.2-0.2-1 % SOLN Place 1 drop into both eyes daily as needed (Dry eye).    . haloperidol (HALDOL) 1 MG tablet Take 1 tablet (1 mg total) by mouth daily as needed for agitation. 10 tablet 0  . levothyroxine (SYNTHROID) 75 MCG tablet Take 1 tablet (75 mcg total) by mouth daily before breakfast. 90 tablet 0  . NONFORMULARY OR COMPOUNDED ITEM rollator walker  #1  As directed -------  Dx  CHF,  DOE,  OA 1 each 0  . potassium chloride SA (KLOR-CON) 20 MEQ tablet Take 2 tablets (40 mEq total) by mouth daily. 90 tablet 1  . psyllium (METAMUCIL) 0.52 g capsule Take 0.52 g by mouth daily.    . Rivaroxaban (XARELTO) 15 MG TABS tablet TAKE 1 TABLET(15 MG) BY MOUTH DAILY WITH BREAKFAST DISCONTINUE ELIQUIS 90 tablet 0  . sertraline (ZOLOFT) 50 MG tablet Take 1 tablet (50 mg total) by mouth daily. 90 tablet 0   No current facility-administered medications for this visit.    Allergies:   Patient  has no known allergies.   Social History:  The patient  reports that she has never smoked. She has never used smokeless tobacco. She reports that she does not drink alcohol and does not use drugs.   Family History:  The patient's family history includes Arthritis in her mother; Coronary artery disease in an other family member; Diabetes in her brother; Heart disease in her mother; Heart disease (age of onset: 46) in her father.  ROS:  Please see the history of present illness.    All other systems are reviewed and otherwise negative.   PHYSICAL EXAM:  VS:  BP (!) 108/50   Pulse 78   Ht 5\' 4"  (1.626 m)   Wt 160 lb 6.4 oz (72.8 kg)   SpO2 96%  BMI 27.53 kg/m  BMI: Body mass index is 27.53 kg/m. Well nourished, well developed, in no acute distress HEENT: normocephalic, atraumatic Neck: no JVD, carotid bruits or masses Cardiac:  irreg-irreg; no significant murmurs, no rubs, or gallops Lungs:  CTA b/l, no wheezing, rhonchi or rales Abd: soft, nontender MS: no deformity, age appropriate atrophy Ext: 1+ edema b/l,  Wearing compression stockings Skin: warm and dry, no rash Neuro:  No gross deficits appreciated Psych: euthymic mood, full affect   EKG:  Not done today  02/05/21: TTE IMPRESSIONS  1. Left ventricular systolic function is normal. EF 55-60%. No regional  wall motion abnormalities. There is mild focal hypertrophy of the basilar  septum. Diastolic function is indeterminate due to atrial fibrillation.  Findings suggestive of restrictive  cardiomyopathy.  2. Right ventricular systolic function is mildly reduced. The right  ventricular size is moderately enlarged. There is normal pulmonary artery  systolic pressure.  3. Left atrial size was severely dilated. (82mm) 4. Right atrial size was severely dilated.  5. The mitral valve is normal in structure. Mild mitral valve  regurgitation. No evidence of mitral stenosis.  6. Tricuspid valve regurgitation is severe.  7.  The aortic valve is tricuspid. There is moderate calcification of the  aortic valve. Aortic valve regurgitation is not visualized. No aortic  stenosis is present.  8. The inferior vena cava is normal in size with greater than 50%  respiratory variability, suggesting right atrial pressure of 3 mmHg.    Recent Labs: 11/26/2020: Pro B Natriuretic peptide (BNP) 411.0 02/04/2021: B Natriuretic Peptide 305.9 02/05/2021: Magnesium 2.0; TSH 1.738 02/09/2021: ALT 13; Hemoglobin 12.7; Platelets 279.0 02/16/2021: BUN 21; Creatinine, Ser 1.49; Potassium 4.9; Sodium 140  No results found for requested labs within last 8760 hours.   Estimated Creatinine Clearance: 26.5 mL/min (A) (by C-G formula based on SCr of 1.49 mg/dL (H)).   Wt Readings from Last 3 Encounters:  03/01/21 160 lb 6.4 oz (72.8 kg)  02/16/21 157 lb 3.2 oz (71.3 kg)  02/08/21 151 lb 14.4 oz (68.9 kg)     Other studies reviewed: Additional studies/records reviewed today include: summarized above  ASSESSMENT AND PLAN:  1. Permanent AFib     CHAD2DS2Vasc is 7, on xarelto, appropriately dosed at 15mg  daily     LA described as severely dilated, measured 6mm     Rate appears controlled  2. Chronic CHF (diastolic, ?restrictive) 3. Mild RV failue, dilated RV 4. VHD, severe TR     Recent acute exacerbation     Weights at home fluctuate and fairly regularly needing an extra dose of her furosemide She was observed to be SOB walking intoday with her walker Initial O2 sat 90 >> 94% after rest Weight here is up from her last visit  Will add spironolactone Reduce her Kdur in 1/2 to one tab daily BMET today and in 7-10 days   5. HTN     Looks ok    Changes as above  Disposition: will have her back in a month, sooner if needed  Current medicines are reviewed at length with the patient today.  The patient did not have any concerns regarding medicines.  9-10, PA-C 03/01/2021 10:56 AM     [facility] [address] [place] [phone] (office)  [phone] (fax)

## 2021-03-01 ENCOUNTER — Ambulatory Visit: Payer: Medicare Other | Admitting: Physician Assistant

## 2021-03-01 ENCOUNTER — Encounter: Payer: Self-pay | Admitting: Physician Assistant

## 2021-03-01 ENCOUNTER — Other Ambulatory Visit: Payer: Self-pay

## 2021-03-01 VITALS — BP 108/50 | HR 78 | Ht 64.0 in | Wt 160.4 lb

## 2021-03-01 DIAGNOSIS — R0602 Shortness of breath: Secondary | ICD-10-CM

## 2021-03-01 DIAGNOSIS — I10 Essential (primary) hypertension: Secondary | ICD-10-CM | POA: Diagnosis not present

## 2021-03-01 DIAGNOSIS — I4821 Permanent atrial fibrillation: Secondary | ICD-10-CM

## 2021-03-01 DIAGNOSIS — I361 Nonrheumatic tricuspid (valve) insufficiency: Secondary | ICD-10-CM

## 2021-03-01 DIAGNOSIS — I5032 Chronic diastolic (congestive) heart failure: Secondary | ICD-10-CM

## 2021-03-01 LAB — BASIC METABOLIC PANEL
BUN/Creatinine Ratio: 14 (ref 12–28)
BUN: 21 mg/dL (ref 8–27)
CO2: 21 mmol/L (ref 20–29)
Calcium: 9 mg/dL (ref 8.7–10.3)
Chloride: 105 mmol/L (ref 96–106)
Creatinine, Ser: 1.48 mg/dL — ABNORMAL HIGH (ref 0.57–1.00)
Glucose: 78 mg/dL (ref 65–99)
Potassium: 4.1 mmol/L (ref 3.5–5.2)
Sodium: 143 mmol/L (ref 134–144)
eGFR: 34 mL/min/{1.73_m2} — ABNORMAL LOW (ref >59)

## 2021-03-01 MED ORDER — POTASSIUM CHLORIDE CRYS ER 20 MEQ PO TBCR: 20.0000 meq | EXTENDED_RELEASE_TABLET | Freq: Every day | ORAL | 3 refills | Status: DC

## 2021-03-01 MED ORDER — SPIRONOLACTONE 25 MG PO TABS: 12.5000 mg | ORAL_TABLET | Freq: Every day | ORAL | 3 refills | Status: DC

## 2021-03-01 NOTE — Patient Instructions (Signed)
Medication Instructions:  Your physician has recommended you make the following change in your medication:  1. DECREASE POTASSIUM TO 20 MEQ DAILY. 2. START ALDACTONE 12.5 MG DAILY   *If you need a refill on your cardiac medications before your next appointment, please call your pharmacy*   Lab Work: TODAY: BMET TO BE DONE IN 7-10 DAYS: BMET  If you have labs (blood work) drawn today and your tests are completely normal, you will receive your results only by: Marland Kitchen MyChart Message (if you have MyChart) OR . A paper copy in the mail If you have any lab test that is abnormal or we need to change your treatment, we will call you to review the results.   Testing/Procedures: NONE  Follow-Up: At [facility], you and your health needs are our priority.  As part of our continuing mission to provide you with exceptional heart care, we have created designated Provider Care Teams.  These Care Teams include your primary Cardiologist (physician) and Advanced Practice Providers (APPs -  Physician Assistants and Nurse Practitioners) who all work together to provide you with the care you need, when you need it.  We recommend signing up for the patient portal called "MyChart".  Sign up information is provided on this After Visit Summary.  MyChart is used to connect with patients for Virtual Visits (Telemedicine).  Patients are able to view lab/test results, encounter notes, upcoming appointments, etc.  Non-urgent messages can be sent to your provider as well.   To learn more about what you can do with MyChart, go to [email or web address].    Your next appointment:   1 month(s)  The format for your next appointment:   In Person  Provider:   You may see Will Jorja Loa, MD or one of the following Advanced Practice Providers on your designated Care Team:    Francis Dowse, [place]  Casimiro Needle "[facility]" [place], [place]

## 2021-03-04 MED ORDER — SPIRONOLACTONE 25 MG PO TABS: 25.0000 mg | ORAL_TABLET | ORAL | 3 refills | Status: DC

## 2021-03-04 NOTE — Telephone Encounter (Signed)
Called and spoke with daughter, DPR on file.  Made her aware of recommendations to take Spironolactone 25mg  QOD instead of a half tablet daily.  Daughter very appreciative for assistance.

## 2021-03-08 ENCOUNTER — Other Ambulatory Visit: Payer: Medicare Other | Admitting: *Deleted

## 2021-03-08 ENCOUNTER — Other Ambulatory Visit: Payer: Self-pay

## 2021-03-08 DIAGNOSIS — I10 Essential (primary) hypertension: Secondary | ICD-10-CM

## 2021-03-08 DIAGNOSIS — R0602 Shortness of breath: Secondary | ICD-10-CM

## 2021-03-09 ENCOUNTER — Encounter: Payer: Self-pay | Admitting: Family Medicine

## 2021-03-09 ENCOUNTER — Ambulatory Visit (INDEPENDENT_AMBULATORY_CARE_PROVIDER_SITE_OTHER): Payer: Medicare Other | Admitting: Family Medicine

## 2021-03-09 ENCOUNTER — Other Ambulatory Visit: Payer: Self-pay

## 2021-03-09 DIAGNOSIS — E785 Hyperlipidemia, unspecified: Secondary | ICD-10-CM

## 2021-03-09 DIAGNOSIS — L03119 Cellulitis of unspecified part of limb: Secondary | ICD-10-CM

## 2021-03-09 DIAGNOSIS — E039 Hypothyroidism, unspecified: Secondary | ICD-10-CM

## 2021-03-09 DIAGNOSIS — I480 Paroxysmal atrial fibrillation: Secondary | ICD-10-CM

## 2021-03-09 DIAGNOSIS — N390 Urinary tract infection, site not specified: Secondary | ICD-10-CM | POA: Diagnosis not present

## 2021-03-09 DIAGNOSIS — N1832 Chronic kidney disease, stage 3b: Secondary | ICD-10-CM

## 2021-03-09 DIAGNOSIS — F419 Anxiety disorder, unspecified: Secondary | ICD-10-CM

## 2021-03-09 DIAGNOSIS — I10 Essential (primary) hypertension: Secondary | ICD-10-CM

## 2021-03-09 DIAGNOSIS — R413 Other amnesia: Secondary | ICD-10-CM | POA: Diagnosis not present

## 2021-03-09 DIAGNOSIS — R600 Localized edema: Secondary | ICD-10-CM

## 2021-03-09 DIAGNOSIS — I5033 Acute on chronic diastolic (congestive) heart failure: Secondary | ICD-10-CM

## 2021-03-09 LAB — BASIC METABOLIC PANEL
BUN/Creatinine Ratio: 11 — ABNORMAL LOW (ref 12–28)
BUN: 18 mg/dL (ref 8–27)
CO2: 23 mmol/L (ref 20–29)
Calcium: 9.2 mg/dL (ref 8.7–10.3)
Chloride: 102 mmol/L (ref 96–106)
Creatinine, Ser: 1.58 mg/dL — ABNORMAL HIGH (ref 0.57–1.00)
Glucose: 100 mg/dL — ABNORMAL HIGH (ref 65–99)
Potassium: 4.8 mmol/L (ref 3.5–5.2)
Sodium: 140 mmol/L (ref 134–144)
eGFR: 32 mL/min/{1.73_m2} — ABNORMAL LOW (ref >59)

## 2021-03-09 NOTE — Patient Instructions (Signed)

## 2021-03-09 NOTE — Progress Notes (Signed)
Subjective:   By signing my name below, I, Belinda Anderson, attest that this documentation has been prepared under the direction and in the presence of Dr. Roma Schanz, DO. 03/09/2021      Patient ID: Belinda Anderson, female    DOB: October 09, 1932, 87 y.o.   MRN: [id number]  Chief Complaint  Patient presents with  . Follow-up    HPI Patient is in today for a office visit. She continues to be anxious during the visit only, otherwise she is normal. She reports having of a painful rash in her back after she was discharged from the hospital but it has improved since then and does not hurt anymore. Her leg swell has improved while on 25 mg spironolactone QOD PO. She currently lives with her daughter. She is requesting a form be fill out. She has on average 4 doctors appointments a month. She needs to be assisted multiple times throughout the day from her daughter. She was diagnosed with A-Fib 2 years ago on 2020 during her stroke episode. She has an upcoming appointment with her cardiologist in June, 2022. She was diagnosed with congestive heart failure this year, 2022.   Past Medical History:  Diagnosis Date  . Arthritis   . Atrial fibrillation ([place])   . Hypertension   . Thyroid disease   . TIA (transient ischemic attack)     Past Surgical History:  Procedure Laterality Date  . CARDIOVERSION N/A 04/15/2020   Procedure: CARDIOVERSION;  Surgeon: Elouise Munroe, MD;  Location: [facility] ENDOSCOPY;  Service: Cardiovascular;  Laterality: N/A;  . EYE SURGERY     lens implant  . IR ANGIO VERTEBRAL SEL SUBCLAVIAN INNOMINATE UNI R MOD SED  09/30/2019  . IR CT HEAD LTD  09/30/2019  . IR PERCUTANEOUS ART THROMBECTOMY/INFUSION INTRACRANIAL INC DIAG ANGIO  09/30/2019  . RADIOLOGY WITH ANESTHESIA N/A 09/30/2019   Procedure: IR WITH ANESTHESIA;  Surgeon: Luanne Bras, MD;  Location: [place];  Service: Radiology;  Laterality: N/A;  . TEE WITHOUT CARDIOVERSION N/A 04/15/2020   Procedure:  TRANSESOPHAGEAL ECHOCARDIOGRAM (TEE);  Surgeon: Elouise Munroe, MD;  Location: [facility] ENDOSCOPY;  Service: Cardiovascular;  Laterality: N/A;    Family History  Problem Relation Age of Onset  . Arthritis Mother   . Heart disease Mother   . Heart disease Father 71       MI  . Coronary artery disease Other   . Diabetes Brother        DI    Social History   Socioeconomic History  . Marital status: Widowed    Spouse name: Not on file  . Number of children: 3  . Years of education: Not on file  . Highest education level: Not on file  Occupational History  . Not on file  Tobacco Use  . Smoking status: Never Smoker  . Smokeless tobacco: Never Used  Vaping Use  . Vaping Use: Never used  Substance and Sexual Activity  . Alcohol use: No  . Drug use: No  . Sexual activity: Not Currently    Partners: Male  Other Topics Concern  . Not on file  Social History Narrative   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, TEFL teacher   Social Determinants of Health   Financial Resource Strain: Not on file  Food Insecurity: No Food Insecurity  . Worried About Charity fundraiser in the Last Year: Never true  . Ran Out of Food in the Last Year: Never true  Transportation Needs:  No Transportation Needs  . Lack of Transportation (Medical): No  . Lack of Transportation (Non-Medical): No  Physical Activity: Not on file  Stress: Not on file  Social Connections: Not on file  Intimate Partner Violence: Not on file    Outpatient Medications Prior to Visit  Medication Sig Dispense Refill  . acetaminophen (TYLENOL) 650 MG CR tablet Take 1,300 mg by mouth daily as needed for pain (arthritis).     Marland Kitchen diclofenac Sodium (VOLTAREN) 1 % GEL Apply 2 g topically daily as needed (pain). 150 g 3  . diltiazem (CARDIZEM CD) 180 MG 24 hr capsule TAKE 1 CAPSULE(180 MG) BY MOUTH DAILY 90 capsule 0  . furosemide (LASIX) 20 MG tablet Take 3 tablets (60 mg total) by mouth daily.    .  Glycerin-Hypromellose-PEG 400 ([employer] DRY EYE RELIEF) 0.2-0.2-1 % SOLN Place 1 drop into both eyes daily as needed (Dry eye).    . haloperidol (HALDOL) 1 MG tablet Take 1 tablet (1 mg total) by mouth daily as needed for agitation. 10 tablet 0  . levothyroxine (SYNTHROID) 75 MCG tablet Take 1 tablet (75 mcg total) by mouth daily before breakfast. 90 tablet 0  . NONFORMULARY OR COMPOUNDED ITEM rollator walker  #1  As directed -------  Dx  CHF,  DOE,  OA 1 each 0  . potassium chloride SA (KLOR-CON) 20 MEQ tablet Take 1 tablet (20 mEq total) by mouth daily. 90 tablet 3  . psyllium (METAMUCIL) 0.52 g capsule Take 0.52 g by mouth daily.    . Rivaroxaban (XARELTO) 15 MG TABS tablet TAKE 1 TABLET(15 MG) BY MOUTH DAILY WITH BREAKFAST DISCONTINUE ELIQUIS 90 tablet 0  . sertraline (ZOLOFT) 50 MG tablet Take 1 tablet (50 mg total) by mouth daily. 90 tablet 0  . spironolactone (ALDACTONE) 25 MG tablet Take 1 tablet (25 mg total) by mouth every other day. 45 tablet 3   No facility-administered medications prior to visit.    No Known Allergies  Review of Systems  Constitutional: Negative for chills, fever and malaise/fatigue.  HENT: Negative for congestion and hearing loss.   Eyes: Negative for discharge.  Respiratory: Negative for cough, sputum production and shortness of breath.   Cardiovascular: Negative for chest pain, palpitations and leg swelling.  Gastrointestinal: Negative for abdominal pain, blood in stool, constipation, diarrhea, heartburn, nausea and vomiting.  Genitourinary: Negative for dysuria, frequency, hematuria and urgency.  Musculoskeletal: Negative for back pain, falls and myalgias.  Skin: Negative for rash.  Neurological: Negative for dizziness, sensory change, loss of consciousness, weakness and headaches.  Endo/Heme/Allergies: Negative for environmental allergies. Does not bruise/bleed easily.  Psychiatric/Behavioral: Positive for memory loss. Negative for depression and suicidal  ideas. The patient is nervous/anxious. The patient does not have insomnia.        Objective:    Physical Exam Constitutional:      Appearance: Normal appearance.  HENT:     Head: Normocephalic and atraumatic.     Right Ear: External ear normal.     Left Ear: External ear normal.  Eyes:     Extraocular Movements: Extraocular movements intact.     Pupils: Pupils are equal, round, and reactive to light.  Cardiovascular:     Rate and Rhythm: Normal rate.     Heart sounds: S1 normal and S2 normal.  Musculoskeletal:     Right lower leg: No edema.     Left lower leg: No edema.  Skin:    General: Skin is warm and dry.  Neurological:  Mental Status: She is alert. Mental status is at baseline.     Comments: Oriented x1  Psychiatric:        Behavior: Behavior normal.     BP 110/72 (BP Location: Right Arm, Patient Position: Sitting, Cuff Size: Normal)   Pulse 90   Temp 97.6 F (36.4 C) (Oral)   Resp 18   Ht _0  (1.626 m)   Wt 153 lb 6.4 oz (69.6 kg)   SpO2 98%   BMI 26.33 kg/m  Wt Readings from Last 3 Encounters:  03/09/21 153 lb 6.4 oz (69.6 kg)  03/01/21 160 lb 6.4 oz (72.8 kg)  02/16/21 157 lb 3.2 oz (71.3 kg)    Diabetic Foot Exam - Simple   No data filed    Lab Results  Component Value Date   WBC 7.9 02/09/2021   HGB 12.7 02/09/2021   HCT 38.9 02/09/2021   PLT 279.0 02/09/2021   GLUCOSE 100 (H) 03/08/2021   CHOL 117 01/30/2020   TRIG 75.0 01/30/2020   HDL 33.80 (L) 01/30/2020   LDLDIRECT 97.0 04/06/2018   LDLCALC 68 01/30/2020   ALT 13 02/09/2021   AST 23 02/09/2021   NA 140 03/08/2021   K 4.8 03/08/2021   CL 102 03/08/2021   CREATININE 1.58 (H) 03/08/2021   BUN 18 03/08/2021   CO2 23 03/08/2021   TSH 1.738 02/05/2021   INR 1.6 (H) 02/04/2021   HGBA1C 6.1 11/26/2020    Lab Results  Component Value Date   TSH 1.738 02/05/2021   Lab Results  Component Value Date   WBC 7.9 02/09/2021   HGB 12.7 02/09/2021   HCT 38.9 02/09/2021   MCV  88.0 02/09/2021   PLT 279.0 02/09/2021   Lab Results  Component Value Date   NA 140 03/08/2021   K 4.8 03/08/2021   CO2 23 03/08/2021   GLUCOSE 100 (H) 03/08/2021   BUN 18 03/08/2021   CREATININE 1.58 (H) 03/08/2021   BILITOT 0.8 02/09/2021   ALKPHOS 133 (H) 02/09/2021   AST 23 02/09/2021   ALT 13 02/09/2021   PROT 6.5 02/09/2021   ALBUMIN 3.8 02/09/2021   CALCIUM 9.2 03/08/2021   ANIONGAP 12 02/08/2021   EGFR 32 (L) 03/08/2021   GFR 30.81 (L) 02/09/2021   Lab Results  Component Value Date   CHOL 117 01/30/2020   Lab Results  Component Value Date   HDL 33.80 (L) 01/30/2020   Lab Results  Component Value Date   LDLCALC 68 01/30/2020   Lab Results  Component Value Date   TRIG 75.0 01/30/2020   Lab Results  Component Value Date   CHOLHDL 3 01/30/2020   Lab Results  Component Value Date   HGBA1C 6.1 11/26/2020       Assessment & Plan:   Problem List Items Addressed This Visit      Unprioritized   Acute on chronic diastolic CHF (congestive heart failure) (HCC)    Stable Per cardiology      AF (paroxysmal atrial fibrillation) ([employer])    Per cardiology On xaralto and cardizem       Anxiety    Improved with zoloft and haldol       Bilateral leg edema    Improved with lasix and spironolactone      Cellulitis    resolved      CKD (chronic kidney disease), stage III ([place])    At baseline on d/c   Chemistry      Component Value Date/Time  NA 140 03/08/2021 1446   K 4.8 03/08/2021 1446   CL 102 03/08/2021 1446   CO2 23 03/08/2021 1446   BUN 18 03/08/2021 1446   CREATININE 1.58 (H) 03/08/2021 1446      Component Value Date/Time   CALCIUM 9.2 03/08/2021 1446   ALKPHOS 133 (H) 02/09/2021 0955   AST 23 02/09/2021 0955   ALT 13 02/09/2021 0955   BILITOT 0.8 02/09/2021 0955   BILITOT 0.4 12/02/2019 1739          Essential hypertension    Well controlled, no changes to meds. Encouraged heart healthy diet such as the DASH diet and exercise  as tolerated.       Hyperlipidemia    Encouraged heart healthy diet, increase exercise, avoid trans fats, consider a krill oil cap daily      HYPERTENSION, BENIGN    Well controlled, no changes to meds. Encouraged heart healthy diet such as the DASH diet and exercise as tolerated.       Hypothyroidism    Con't synthroid      Memory loss    At baseline      Urinary tract infection without hematuria    Resolved in hosp and mentation was at baseline at d/c            No orders of the defined types were placed in this encounter.   I, Dr. Roma Schanz, DO, personally preformed the services described in this documentation.  All medical record entries made by the scribe were at my direction and in my presence.  I have reviewed the chart and discharge instructions (if applicable) and agree that the record reflects my personal performance and is accurate and complete. 03/09/2021   I,Belinda Anderson,acting as a scribe for [name], DO.,have documented all relevant documentation on the behalf of [name], DO,as directed by  [name], DO while in the presence of [name], DO.   [name], DO

## 2021-03-10 NOTE — Assessment & Plan Note (Signed)
Well controlled, no changes to meds. Encouraged heart healthy diet such as the DASH diet and exercise as tolerated.  °

## 2021-03-10 NOTE — Assessment & Plan Note (Signed)
At baseline on d/c   Chemistry      Component Value Date/Time   NA 140 03/08/2021 1446   K 4.8 03/08/2021 1446   CL 102 03/08/2021 1446   CO2 23 03/08/2021 1446   BUN 18 03/08/2021 1446   CREATININE 1.58 (H) 03/08/2021 1446      Component Value Date/Time   CALCIUM 9.2 03/08/2021 1446   ALKPHOS 133 (H) 02/09/2021 0955   AST 23 02/09/2021 0955   ALT 13 02/09/2021 0955   BILITOT 0.8 02/09/2021 0955   BILITOT 0.4 12/02/2019 1739

## 2021-03-10 NOTE — Assessment & Plan Note (Signed)
Improved with zoloft and haldol

## 2021-03-10 NOTE — Assessment & Plan Note (Signed)
Stable Per cardiology 

## 2021-03-10 NOTE — Assessment & Plan Note (Signed)
Cont synthroid 

## 2021-03-10 NOTE — Assessment & Plan Note (Signed)
At baseline 

## 2021-03-10 NOTE — Assessment & Plan Note (Signed)
Per cardiology On xaralto and cardizem

## 2021-03-10 NOTE — Assessment & Plan Note (Signed)
resolved 

## 2021-03-10 NOTE — Assessment & Plan Note (Signed)
Encouraged heart healthy diet, increase exercise, avoid trans fats, consider a krill oil cap daily 

## 2021-03-10 NOTE — Assessment & Plan Note (Signed)
Resolved in hosp and mentation was at baseline at d/c

## 2021-03-10 NOTE — Assessment & Plan Note (Signed)
Improved with lasix and spironolactone

## 2021-03-12 ENCOUNTER — Encounter: Payer: Self-pay | Admitting: Family Medicine

## 2021-03-12 ENCOUNTER — Telehealth: Payer: Self-pay | Admitting: Family Medicine

## 2021-03-12 ENCOUNTER — Other Ambulatory Visit: Payer: Self-pay | Admitting: Family Medicine

## 2021-03-12 MED ORDER — HALOPERIDOL 1 MG PO TABS: 1.0000 mg | ORAL_TABLET | Freq: Every day | ORAL | 0 refills | Status: DC | PRN

## 2021-03-12 NOTE — Telephone Encounter (Signed)
agree

## 2021-03-12 NOTE — Telephone Encounter (Signed)
Caller: Tresa Endo [facility]) Call back # [phone]  Extended verbal order for PT  1 time a week for 4 weeks Ok to leave message

## 2021-03-12 NOTE — Telephone Encounter (Signed)
Verbal given. FYI 

## 2021-03-12 NOTE — Telephone Encounter (Signed)
Okay to refill haldol? Last refilled in Aprill 2022. Last filled by Abner Greenspan.

## 2021-03-25 ENCOUNTER — Ambulatory Visit (INDEPENDENT_AMBULATORY_CARE_PROVIDER_SITE_OTHER): Payer: Medicare Other

## 2021-03-25 DIAGNOSIS — I509 Heart failure, unspecified: Secondary | ICD-10-CM | POA: Diagnosis not present

## 2021-03-25 DIAGNOSIS — F419 Anxiety disorder, unspecified: Secondary | ICD-10-CM

## 2021-03-25 NOTE — Chronic Care Management (AMB) (Signed)
Chronic Care Management   CCM RN Visit Note  03/25/2021 Name: Belinda Anderson MRN: [id number] DOB: 26-Jan-1932  Subjective: Belinda Anderson is a 87 y.o. year old female who is a primary care patient of [name], DO. The care management team was consulted for assistance with disease management and care coordination needs.    Engaged with patient by telephone for follow up visit in response to provider referral for case management and/or care coordination services.   Consent to Services:  The patient was given information about Chronic Care Management services, agreed to services, and gave verbal consent prior to initiation of services.  Please see initial visit note for detailed documentation.   Patient agreed to services and verbal consent obtained.   Assessment: Review of patient past medical history, allergies, medications, health status, including review of consultants reports, laboratory and other test data, was performed as part of comprehensive evaluation and provision of chronic care management services.   SDOH (Social Determinants of Health) assessments and interventions performed:    CCM Care Plan  No Known Allergies  Outpatient Encounter Medications as of 03/25/2021  Medication Sig   acetaminophen (TYLENOL) 650 MG CR tablet Take 1,300 mg by mouth daily as needed for pain (arthritis).    diclofenac Sodium (VOLTAREN) 1 % GEL Apply 2 g topically daily as needed (pain).   diltiazem (CARDIZEM CD) 180 MG 24 hr capsule TAKE 1 CAPSULE(180 MG) BY MOUTH DAILY   furosemide (LASIX) 20 MG tablet Take 3 tablets (60 mg total) by mouth daily.   Glycerin-Hypromellose-PEG 400 ([facility] DRY EYE RELIEF) 0.2-0.2-1 % SOLN Place 1 drop into both eyes daily as needed (Dry eye).   haloperidol (HALDOL) 1 MG tablet Take 1 tablet (1 mg total) by mouth daily as needed for agitation.   levothyroxine (SYNTHROID) 75 MCG tablet Take 1 tablet (75 mcg total) by mouth daily before breakfast.   potassium  chloride SA (KLOR-CON) 20 MEQ tablet Take 1 tablet (20 mEq total) by mouth daily.   psyllium (METAMUCIL) 0.52 g capsule Take 0.52 g by mouth daily.   Rivaroxaban (XARELTO) 15 MG TABS tablet TAKE 1 TABLET(15 MG) BY MOUTH DAILY WITH BREAKFAST DISCONTINUE ELIQUIS   sertraline (ZOLOFT) 50 MG tablet Take 1 tablet (50 mg total) by mouth daily.   spironolactone (ALDACTONE) 25 MG tablet Take 1 tablet (25 mg total) by mouth every other day.   NONFORMULARY OR COMPOUNDED ITEM rollator walker  #1  As directed -------  Dx  CHF,  DOE,  OA   No facility-administered encounter medications on file as of 03/25/2021.    Patient Active Problem List   Diagnosis Date Noted   Left foot pain 02/10/2021   Weakness 02/10/2021   Urinary tract infection without hematuria 02/10/2021   Cellulitis 02/10/2021   Dementia (HCC) 02/10/2021   Gout 02/10/2021   SOB (shortness of breath) 02/05/2021   CKD (chronic kidney disease), stage III (HCC) 02/05/2021   Anxiety 12/24/2020   Impacted cerumen of right ear 12/24/2020   Acute on chronic diastolic CHF (congestive heart failure) (HCC) 11/26/2020   Bilateral leg edema 01/30/2020   COVID-19 virus infection 11/21/2019   Cough 11/21/2019   External hemorrhoid, bleeding 10/12/2019   AF (paroxysmal atrial fibrillation) (HCC) 10/02/2019   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 09/30/2019   Middle cerebral artery embolism, right 09/30/2019   Contusion of foot including toes, right, initial encounter 08/10/2018   Primary osteoarthritis of right knee  07/31/2018   Right leg swelling 07/17/2018   Pain of right calf 07/17/2018   Preventative health care 09/18/2017   Low back pain radiating to left leg 11/28/2016   Hyperlipidemia 02/02/2015   Sciatica 07/16/2014   Knee pain, acute 07/16/2014   Lower extremity pain, right 07/16/2014   Skin infection 07/16/2014   Incontinence of urine in female 06/19/2013   Cerebral aneurysm rupture (HCC)  04/08/2013   LEG EDEMA, RIGHT 07/06/2010   OTHER DRUG ALLERGY 07/06/2010   DIARRHEA 02/12/2010   HEMATURIA, HX OF 11/24/2009   ANEURYSM, HX OF 08/03/2009   B12 deficiency 07/24/2009   ACQUIRED HEMOLYTIC ANEMIA UNSPECIFIED 07/24/2009   SYNCOPE 07/20/2009   DIZZINESS 07/20/2009   Memory loss 07/20/2009   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   Hypothyroidism 12/14/2007   Essential hypertension 12/14/2007   DIVERTICULITIS, HX OF 12/14/2007   GOITER 01/30/2007   HYPERTENSION, BENIGN 01/30/2007   UTERINE POLYP 01/30/2007   THYROIDECTOMY, HX OF 01/30/2007    Conditions to be addressed/monitored:Atrial Fibrillation, CHF, and Anxiety  Care Plan : Cardiovascular Disease  Updates made by Colletta Maryland, RN since 03/25/2021 12:00 AM     Problem: Symptom managment of cardivascular disease   Priority: Medium     Long-Range Goal: Symptom Exacerbation Prevented or Minimized   Start Date: 01/07/2021  Expected End Date: 09/24/2021  This Visit's Progress: On track  Recent Progress: Not on track  Priority: Medium  Note:   Current Barriers:  Knowledge deficit related to long term care plan management of cardiovascular disease. Admission 02/04/21-02/08/21 with acute on chronic CHF, A-fib, UTI, CKD stage III, dementia. Spoke with [name], DPR. Client is currently living with daughter, [name]. [name] states that client  continues to have home health nursing and physical therapy. Client also has personal care service. She states client's weight has decreased to 144 pounds and that client does not have any swelling in her legs. Denies any signs/symptoms of exacerbation. Case Manager Clinical Goal(s):  Patient/caregiver will verbalize understanding of management of cardiovascular disease and when to call doctor Patient will take medications as prescribed. Patient/caregiver will have appropriate community resources/support Interventions:  Collaboration with [name], Grayling Congress, DO  regarding development and update of comprehensive plan of care as evidenced by provider attestation and co-signature Inter-disciplinary care team collaboration (see longitudinal plan of care) Discussed weights; Reiterated signs/symptoms of exacerbation. Medications Reviewed embedded social work referral - currently keeping client in the home for as long as possible, but would like to know the placement process.  Patient Goals: - know when to call the doctor: increased weight gain more than 3 pounds overnight or 5 pounds in a week, increased swelling in stomach, hands, feet or legs, increased shortness of breath, if it is harder for you to breathe when lying down and need to sit up to breath or you feel that something is not right. New or worsening dizziness, racing or irregular heartbeat that may be uncomfortable -call provider for questions or concerns. -continue to eat healthy: low salt, whole grains, fruits and vegetables, lean meats and healthy fats. May eat smaller portions (4-6 times a day) if needed to get nutrients in.  -continue to attend scheduled cardiologist appointment: next appointment scheduled for April 01, 2021   -continue to take medications as prescribed Follow Up Plan: Telephone follow up appointment with care management team member scheduled for: July 7th, 2022 The patient has been provided with contact information for the care management team and has been advised  to call with any health related questions or concerns.       Care Plan : Anxiety  Updates made by Colletta MarylandWallace, Eryka Dolinger M, RN since 03/25/2021 12:00 AM     Problem: Quality of life affected by anxiety in patient with Chronic condition: Heart failure, atrial fibrillation,memory loss   Priority: High     Long-Range Goal: Anxiety Symptoms Monitored and Managed   Start Date: 01/07/2021  Expected End Date: 09/21/2021  This Visit's Progress: On track  Recent Progress: On track  Priority: High  Note:   Current Barriers:   Long term care plan for management of Quality of Life. RNCM spoke with DPR, daughter Fulton MoleMarie Vinnefors, who reports continued support from siblings. Client currently living with her daughter Okey RegalCarol. Client remains active with home health nursing and physical therapy. Ms. Abbe AmsterdamVinnefors states she spoke with home health social worker once and received some personal care agency information.  Mrs. Henrene PastorVinnefor reports primary concern is client's agitation. She states client agitation begins in the morning. She states she gives client Haldol as ordered, but concerned about giving client Haldol on a regular basis. She questions if there is a better medication regimen for client to take. She reports client continues to take Zoloft as prescribed.  Nurse Case Manager Clinical Goal(s):  Patient/caregiver will verbalize understanding of plan for management of health, anxiety and disease processes Patient/caregiver will attend scheduled medical appointments  Interventions:  1:1 collaboration with Zola ButtonLowne Chase, Grayling CongressYvonne R, DO regarding development and update of comprehensive plan of care as evidenced by provider attestation and co-signature Inter-disciplinary care team collaboration (see longitudinal plan of care) Evaluation of current treatment plan related to disease processes and patient's adherence to plan as established by provider. Encouraged daughter to continue to  attend provider visits as scheduled, contact provider for any new or worsening symptoms. Reviewed medications Discussed plans with patient for ongoing care management follow up and provided patient with direct contact information for care management team embedded social work referral - currently keeping client in the home for as long as possible, but would like to know the placement process.   Patient Goals/Self-Care Activities Continue to take medications as prescribed Continue to attend all scheduled provider appointments Continue to call pharmacy for  medication refills Continue to provide meaningful experiences spending outdoor time or time in Yard/Garden and outings as tolerated.  Continue to maintain Fall prevention strategies: keep walkways clear, limit or take out throw rugs (could easily become trip hazards), keep good lighting in the home, keep a flashlight near your bed, arrange solid pieces of furniture so that there may be a place to rest throughout the house if needed; change positions slowly. Grab bars in shower and/or next to toilet are useful. Avoid walking around barefoot and  don't walk around in slippers, stocking or socks. Instead wear low-heeled, comfortable shoes with rubber soles. Contact provider if you have fallen, even if there was no injury. Continue to work with home health agency staff as scheduled.  Patient will call provider office for new concerns or questions Work with Child psychotherapistsocial worker as needed   Follow Up Plan: Telephone follow up appointment with care management team member scheduled for: 04/22/21 The patient has been provided with contact information for the care management team and has been advised to call with any health related questions or concerns.        Plan:Telephone follow up appointment with care management team member scheduled for:  04/22/21 and The patient has been provided with contact  information for the care management team and has been advised to call with any health related questions or concerns.   Kathyrn Sheriff, RN, MSN, BSN, CCM Care Management Coordinator [facility] [phone]

## 2021-03-25 NOTE — Patient Instructions (Signed)
Visit Information  PATIENT GOALS:  Goals Addressed             This Visit's Progress    Quality of Life Maintained/Improved   On track    Timeframe:  Long-Range Goal Priority:  High Start Date:   01/07/21                          Expected End Date:   09/21/21                    Follow up date: 04/22/21  Patient Goals: Continue to take medications as prescribed Continue to attend all scheduled provider appointments Continue to call pharmacy for medication refills Continue to provide meaningful experiences spending outdoor time or time in Yard/Garden and outings as tolerated.  Continue to maintain Fall prevention strategies: keep walkways clear, limit or take out throw rugs (could easily become trip hazards), keep good lighting in the home, keep a flashlight near your bed, arrange solid pieces of furniture so that there may be a place to rest throughout the house if needed; change positions slowly. Grab bars in shower and/or next to toilet are useful. Avoid walking around barefoot and  don't walk around in slippers, stocking or socks. Instead wear low-heeled, comfortable shoes with rubber soles. Contact provider if you have fallen, even if there was no injury. Continue to work with home health agency staff as scheduled.  Patient will call provider office for new concerns or questions Work with Child psychotherapist as needed      Track and Manage cardiac signs/symptoms   On track    Timeframe:  Long-Range Goal Priority:  High Start Date:  01/07/21                         Expected End Date:  09/21/21                     Follow Up Date 04/22/21    - know when to call the doctor: increased weight gain more than 3 pounds overnight or 5 pounds in a week, increased swelling in stomach, hands, feet or legs, increased shortness of breath, if it is harder for you to breathe when lying down and need to sit up to breath or you feel that something is not right. New or worsening dizziness, racing or irregular  heartbeat that may be uncomfortable -call provider for questions or concerns. -continue to eat healthy: low salt, whole grains, fruits and vegetables, lean meats and healthy fats. May eat smaller portions (4-6 times a day) if needed to get nutrients in.  -continue to attend scheduled cardiologist appointment: next appointment scheduled for April 01, 2021   -continue to take medications as prescribed   Why is this important?   You will be able to handle your symptoms better if you keep track of them.  Making some simple changes to your lifestyle will help.  Eating healthy is one thing you can do to take good care of yourself.    Notes:          Patient verbalizes understanding of instructions provided today and agrees to view in MyChart.   Telephone follow up appointment with care management team member scheduled for: 04/22/21 The patient has been provided with contact information for the care management team and has been advised to call with any health related questions or concerns.   Kathyrn Sheriff,  RN, MSN, BSN, CCM Care Management Coordinator [facility] [employer] [phone]

## 2021-03-26 ENCOUNTER — Ambulatory Visit: Payer: Medicare Other | Admitting: Family Medicine

## 2021-03-28 ENCOUNTER — Other Ambulatory Visit: Payer: Self-pay | Admitting: Student

## 2021-03-30 ENCOUNTER — Encounter: Payer: Self-pay | Admitting: Family Medicine

## 2021-03-30 ENCOUNTER — Telehealth: Payer: Self-pay | Admitting: Family Medicine

## 2021-03-30 NOTE — Telephone Encounter (Signed)
Dr Laury Axon FYI: Called the callback number listed below to verify that these are correct.

## 2021-03-30 NOTE — Telephone Encounter (Signed)
Caller: Ritu Call back  [phone]  Per family member patient blood pressure run 88-88/60-66 first thing in the morning, then during the day is normal  The patient is complaining about mucus plugs in his urine. A urinalysis and urine culture were done.

## 2021-03-31 ENCOUNTER — Other Ambulatory Visit: Payer: Self-pay

## 2021-03-31 MED ORDER — DILTIAZEM HCL 120 MG PO TABS: 120.0000 mg | ORAL_TABLET | Freq: Every day | ORAL | 2 refills | Status: DC

## 2021-03-31 NOTE — Telephone Encounter (Signed)
New rx sent. Returned call and lvm

## 2021-04-01 ENCOUNTER — Other Ambulatory Visit: Payer: Self-pay

## 2021-04-01 ENCOUNTER — Encounter: Payer: Self-pay | Admitting: Cardiology

## 2021-04-01 ENCOUNTER — Ambulatory Visit: Payer: Medicare Other | Admitting: Cardiology

## 2021-04-01 VITALS — BP 120/70 | HR 64 | Ht 64.0 in | Wt 145.6 lb

## 2021-04-01 DIAGNOSIS — I4821 Permanent atrial fibrillation: Secondary | ICD-10-CM | POA: Diagnosis not present

## 2021-04-01 NOTE — Progress Notes (Signed)
Electrophysiology Office Note   Date:  04/01/2021   ID:  Belinda Anderson, DOB 09-17-1932, MRN [id number]  PCP:  [name], Grayling Congress, DO  Cardiologist:   Primary Electrophysiologist:  Izeah Vossler Jorja Loa, MD    Chief Complaint: Cryptogenic stroke   History of Present Illness: Belinda Anderson is a 87 y.o. female who is being seen today for the evaluation of cryptogenic stroke at the request of [name], *. Presenting today for electrophysiology evaluation.  She has a history significant for hypertension, thyroid disease, cryptogenic stroke caused by embolism to the right middle cerebral artery, and atrial fibrillation.  She is currently on Xarelto.  She had a TEE guided cardioversion but unfortunately went back into atrial fibrillation quite quickly.  Today, denies symptoms of palpitations, chest pain, shortness of breath, orthopnea, PND, lower extremity edema, claudication, dizziness, presyncope, syncope, bleeding, or neurologic sequela. The patient is tolerating medications without difficulties.  She continues to be short of breath.  She was prescribed Lasix at her last visit which improved her shortness of breath and provided a good diuresis.  She takes Lasix now on an as-needed basis if her weight goes up.  She had an echocardiogram that shows a normal ejection fraction and no evidence of volume overload in April.   Past Medical History:  Diagnosis Date   Arthritis    Atrial fibrillation (HCC)    Hypertension    Thyroid disease    TIA (transient ischemic attack)    Past Surgical History:  Procedure Laterality Date   CARDIOVERSION N/A 04/15/2020   Procedure: CARDIOVERSION;  Surgeon: Parke Poisson, MD;  Location: [facility] ENDOSCOPY;  Service: Cardiovascular;  Laterality: N/A;   EYE SURGERY     lens implant   IR ANGIO VERTEBRAL SEL SUBCLAVIAN INNOMINATE UNI R MOD SED  09/30/2019   IR CT HEAD LTD  09/30/2019   IR PERCUTANEOUS ART THROMBECTOMY/INFUSION INTRACRANIAL  INC DIAG ANGIO  09/30/2019   RADIOLOGY WITH ANESTHESIA N/A 09/30/2019   Procedure: IR WITH ANESTHESIA;  Surgeon: Julieanne Cotton, MD;  Location: [facility] OR;  Service: Radiology;  Laterality: N/A;   TEE WITHOUT CARDIOVERSION N/A 04/15/2020   Procedure: TRANSESOPHAGEAL ECHOCARDIOGRAM (TEE);  Surgeon: Parke Poisson, MD;  Location[facility] END[facility]COPY;  Service: Cardiovascular;  Laterality: N/A;     Current Outpatient Medications  Medication Sig Dispense Refill   acetaminophen (TYLENOL) 650 MG CR tablet Take 1,300 mg by mouth daily as needed for pain (arthritis).      diclofenac Sodium (VOLTAREN) 1 % GEL Apply 2 g topically daily as needed (pain). 150 g 3   diltiazem (CARDIZEM) 120 MG tablet Take 1 tablet (120 mg total) by mouth daily. 30 tablet 2   furosemide (LASIX) 20 MG tablet TAKE 2 TABLETS BY MOUTH EVERY DAY, MAY TAKE AN ADDITIONAL TABLET AS NEEDED FOR SWELLING. 270 tablet 2   Glycerin-Hypromellose-PEG 400 ([employer] DRY EYE RELIEF) 0.2-0.2-1 % SOLN Place 1 drop into both eyes daily as needed (Dry eye).     haloperidol (HALDOL) 1 MG tablet Take 1 tablet (1 mg total) by mouth daily as needed for agitation. 30 tablet 0   levothyroxine (SYNTHROID) 75 MCG tablet Take 1 tablet (75 mcg total) by mouth daily before breakfast. 90 tablet 0   NONFORMULARY OR COMPOUNDED ITEM rollator walker  #1  As directed -------  Dx  CHF,  DOE,  OA 1 each 0   potassium chloride SA (KLOR-CON) 20 MEQ tablet Take 1 tablet (20 mEq total) by mouth daily.  90 tablet 3   psyllium (METAMUCIL) 0.52 g capsule Take 0.52 g by mouth daily.     Rivaroxaban (XARELTO) 15 MG TABS tablet TAKE 1 TABLET(15 MG) BY MOUTH DAILY WITH BREAKFAST DISCONTINUE ELIQUIS 90 tablet 0   sertraline (ZOLOFT) 50 MG tablet Take 1 tablet (50 mg total) by mouth daily. 90 tablet 0   spironolactone (ALDACTONE) 25 MG tablet Take 1 tablet (25 mg total) by mouth every other day. 45 tablet 3   No current facility-administered medications for this visit.    Allergies:    Patient has no known allergies.   Social History:  The patient  reports that she has never smoked. She has never used smokeless tobacco. She reports that she does not drink alcohol and does not use drugs.   Family History:  The patient's family history includes Arthritis in her mother; Coronary artery disease in an other family member; Diabetes in her brother; Heart disease in her mother; Heart disease (age of onset: 28) in her father.   ROS:  Please see the history of present illness.   Otherwise, review of systems is positive for none.   All other systems are reviewed and negative.   PHYSICAL EXAM: VS:  BP 120/70   Pulse 64   Ht 5\' 4"  (1.626 m)   Wt 145 lb 9.6 oz (66 kg)   SpO2 94%   BMI 24.99 kg/m  , BMI Body mass index is 24.99 kg/m. GEN: Well nourished, well developed, in no acute distress  HEENT: normal  Neck: no JVD, carotid bruits, or masses Cardiac: irregular; no murmurs, rubs, or gallops,no edema  Respiratory:  clear to auscultation bilaterally, normal work of breathing GI: soft, nontender, nondistended, + BS MS: no deformity or atrophy  Skin: warm and dry Neuro:  Strength and sensation are intact Psych: euthymic mood, full affect  EKG:  EKG is not ordered today. Personal review of the ekg ordered 02/16/21 shows atrial fibrillation, rate 69  Recent Labs: 11/26/2020: Pro B Natriuretic peptide (BNP) 411.0 02/04/2021: B Natriuretic Peptide 305.9 02/05/2021: Magnesium 2.0; TSH 1.738 02/09/2021: ALT 13; Hemoglobin 12.7; Platelets 279.0 03/08/2021: BUN 18; Creatinine, Ser 1.58; Potassium 4.8; Sodium 140    Lipid Panel     Component Value Date/Time   CHOL 117 01/30/2020 1110   TRIG 75.0 01/30/2020 1110   HDL 33.80 (L) 01/30/2020 1110   CHOLHDL 3 01/30/2020 1110   VLDL 15.0 01/30/2020 1110   LDLCALC 68 01/30/2020 1110   LDLDIRECT 97.0 04/06/2018 1029     Wt Readings from Last 3 Encounters:  04/01/21 145 lb 9.6 oz (66 kg)  03/09/21 153 lb 6.4 oz (69.6 kg)  03/01/21  160 lb 6.4 oz (72.8 kg)      Other studies Reviewed: Additional studies/ records that were reviewed today include: TTE 02/05/21 Review of the above records today demonstrates:   1. Left ventricular systolic function is normal. EF 55-60%. No regional  wall motion abnormalities. There is mild focal hypertrophy of the basilar  septum. Diastolic function is indeterminate due to atrial fibrillation.  Findings suggestive of restrictive  cardiomyopathy.   2. Right ventricular systolic function is mildly reduced. The right  ventricular size is moderately enlarged. There is normal pulmonary artery  systolic pressure.   3. Left atrial size was severely dilated.   4. Right atrial size was severely dilated.   5. The mitral valve is normal in structure. Mild mitral valve  regurgitation. No evidence of mitral stenosis.   6. Tricuspid valve  regurgitation is severe.   7. The aortic valve is tricuspid. There is moderate calcification of the  aortic valve. Aortic valve regurgitation is not visualized. No aortic  stenosis is present.   8. The inferior vena cava is normal in size with greater than 50%  respiratory variability, suggesting right atrial pressure of 3 mmHg.    ASSESSMENT AND PLAN:  1.  Persistent atrial fibrillation: Currently on Xarelto with a CHA2DS2-VASc of 6.  She is essentially become permanent at this point.  No plans for cardioversion.  2.  Hypertension: Currently well controlled  3.  Chronic diastolic heart failure: She is short of breath today.  She has gained a few pounds over the last few days.  Her daughter is going to increase her dose of Lasix.  Despite that, I do not feel that her diastolic heart failure can overall explain her shortness of breath.  She had an echo done in April that showed normal systolic function and no evidence of volume overload.  If shortness of breath continues, she knows to discuss with her primary physician whether or not pulmonary consultation would  be reasonable.  Current medicines are reviewed at length with the patient today.   The patient does not have concerns regarding her medicines.  The following changes were made today: None  Labs/ tests ordered today include:  No orders of the defined types were placed in this encounter.    Disposition:   FU with Hollyanne Schloesser 3 months  Signed, Chanie Soucek Jorja Loa, MD  04/01/2021 2:20 PM     [facility] [address] [place] [address] [phone] (office) [phone] (fax)

## 2021-04-01 NOTE — Patient Instructions (Signed)
Medication Instructions:  Your physician recommends that you continue on your current medications as directed. Please refer to the Current Medication list given to you today.  *If you need a refill on your cardiac medications before your next appointment, please call your pharmacy*   Lab Work: None ordered  Testing/Procedures: None ordered   Follow-Up: At [facility], you and your health needs are our priority.  As part of our continuing mission to provide you with exceptional heart care, we have created designated Provider Care Teams.  These Care Teams include your primary Cardiologist (physician) and Advanced Practice Providers (APPs -  Physician Assistants and Nurse Practitioners) who all work together to provide you with the care you need, when you need it.  Your next appointment:   3 month(s)  The format for your next appointment:   In Person  Provider:   Francis Dowse, PA-C    Thank you for choosing [facility]!!   Dory Horn, RN [phone]

## 2021-04-05 ENCOUNTER — Other Ambulatory Visit: Payer: Self-pay | Admitting: Family Medicine

## 2021-04-06 ENCOUNTER — Other Ambulatory Visit: Payer: Self-pay

## 2021-04-06 MED ORDER — CEPHALEXIN 500 MG PO CAPS: 500.0000 mg | ORAL_CAPSULE | Freq: Two times a day (BID) | ORAL | 0 refills | Status: DC

## 2021-04-08 ENCOUNTER — Telehealth: Payer: Self-pay | Admitting: Family Medicine

## 2021-04-08 NOTE — Telephone Encounter (Signed)
LVM to okay orders. And to call back in regard to UA results.

## 2021-04-08 NOTE — Telephone Encounter (Signed)
Belinda Anderson from [facility]  Caller # [phone]  Skill nurse 1x a week for a 4 weeks  Please advice

## 2021-04-08 NOTE — Telephone Encounter (Signed)
Caller: Tresa Endo [facility]) Call back # [phone]  Verbal Order for PT  1 time a week for 9 week  Ok to leave msg

## 2021-04-08 NOTE — Telephone Encounter (Signed)
Marcelino Duster is aware.  Urine Cx was sent off.

## 2021-04-11 ENCOUNTER — Encounter: Payer: Self-pay | Admitting: Family Medicine

## 2021-04-12 ENCOUNTER — Other Ambulatory Visit: Payer: Self-pay | Admitting: Family Medicine

## 2021-04-12 ENCOUNTER — Telehealth: Payer: Self-pay | Admitting: *Deleted

## 2021-04-12 DIAGNOSIS — F0281 Dementia in other diseases classified elsewhere with behavioral disturbance: Secondary | ICD-10-CM

## 2021-04-12 MED ORDER — HALOPERIDOL 1 MG PO TABS: 1.0000 mg | ORAL_TABLET | Freq: Three times a day (TID) | ORAL | 1 refills | Status: DC | PRN

## 2021-04-12 NOTE — Chronic Care Management (AMB) (Signed)
  Chronic Care Management   Note  04/12/2021 Name: Belinda Anderson MRN: [id number] DOB: 04-26-1932  Belinda Anderson is a 87 y.o. year old female who is a primary care patient of [name], Grayling Congress, DO. Belinda Anderson is currently enrolled in care management services. An additional referral from [facility] for Social Worker was placed.   Follow up plan: Telephone appointment with care management team member scheduled for:04/29/2021  [facility] Guide, Embedded Care Coordination [facility] Management

## 2021-04-17 DIAGNOSIS — I10 Essential (primary) hypertension: Secondary | ICD-10-CM | POA: Diagnosis not present

## 2021-04-17 DIAGNOSIS — I509 Heart failure, unspecified: Secondary | ICD-10-CM | POA: Diagnosis not present

## 2021-04-17 DIAGNOSIS — I63411 Cerebral infarction due to embolism of right middle cerebral artery: Secondary | ICD-10-CM

## 2021-04-17 DIAGNOSIS — G309 Alzheimer's disease, unspecified: Secondary | ICD-10-CM

## 2021-04-17 DIAGNOSIS — E785 Hyperlipidemia, unspecified: Secondary | ICD-10-CM

## 2021-04-17 DIAGNOSIS — F0390 Unspecified dementia without behavioral disturbance: Secondary | ICD-10-CM

## 2021-04-17 DIAGNOSIS — F0280 Dementia in other diseases classified elsewhere without behavioral disturbance: Secondary | ICD-10-CM

## 2021-04-17 DIAGNOSIS — I480 Paroxysmal atrial fibrillation: Secondary | ICD-10-CM | POA: Diagnosis not present

## 2021-04-17 DIAGNOSIS — F0281 Dementia in other diseases classified elsewhere with behavioral disturbance: Secondary | ICD-10-CM

## 2021-04-18 ENCOUNTER — Other Ambulatory Visit: Payer: Self-pay | Admitting: Family Medicine

## 2021-04-18 DIAGNOSIS — G309 Alzheimer's disease, unspecified: Secondary | ICD-10-CM

## 2021-04-18 DIAGNOSIS — F0281 Dementia in other diseases classified elsewhere with behavioral disturbance: Secondary | ICD-10-CM

## 2021-04-22 ENCOUNTER — Ambulatory Visit (INDEPENDENT_AMBULATORY_CARE_PROVIDER_SITE_OTHER): Payer: Medicare Other

## 2021-04-22 DIAGNOSIS — G309 Alzheimer's disease, unspecified: Secondary | ICD-10-CM | POA: Diagnosis not present

## 2021-04-22 DIAGNOSIS — I480 Paroxysmal atrial fibrillation: Secondary | ICD-10-CM | POA: Diagnosis not present

## 2021-04-22 DIAGNOSIS — I10 Essential (primary) hypertension: Secondary | ICD-10-CM

## 2021-04-22 DIAGNOSIS — F0281 Dementia in other diseases classified elsewhere with behavioral disturbance: Secondary | ICD-10-CM

## 2021-04-22 DIAGNOSIS — E785 Hyperlipidemia, unspecified: Secondary | ICD-10-CM

## 2021-04-22 DIAGNOSIS — I509 Heart failure, unspecified: Secondary | ICD-10-CM

## 2021-04-22 NOTE — Chronic Care Management (AMB) (Signed)
Chronic Care Management   CCM RN Visit Note  04/22/2021 Name: Belinda Anderson MRN: [id number] DOB: [birth year]  Subjective: Belinda Anderson is a 87 y.o. year old female who is a primary care patient of [name], DO. The care management team was consulted for assistance with disease management and care coordination needs.    Engaged with patient by telephone for follow up visit in response to provider referral for case management and/or care coordination services.   Consent to Services:  The patient was given information about Chronic Care Management services, agreed to services, and gave verbal consent prior to initiation of services.  Please see initial visit note for detailed documentation.   Patient agreed to services and verbal consent obtained.   Assessment: Review of patient past medical history, allergies, medications, health status, including review of consultants reports, laboratory and other test data, was performed as part of comprehensive evaluation and provision of chronic care management services.   SDOH (Social Determinants of Health) assessments and interventions performed:    CCM Care Plan  No Known Allergies  Outpatient Encounter Medications as of 04/22/2021  Medication Sig Note   acetaminophen (TYLENOL) 650 MG CR tablet Take 1,300 mg by mouth daily as needed for pain (arthritis).     diclofenac Sodium (VOLTAREN) 1 % GEL Apply 2 g topically daily as needed (pain).    diltiazem (CARDIZEM) 120 MG tablet Take 1 tablet (120 mg total) by mouth daily.    furosemide (LASIX) 20 MG tablet TAKE 2 TABLETS BY MOUTH EVERY DAY, MAY TAKE AN ADDITIONAL TABLET AS NEEDED FOR SWELLING. 04/22/2021: Reports taking 40 mg before breakfast and 20 mg with lunch for a total of 60 mg daily   Glycerin-Hypromellose-PEG 400 ([facility] DRY EYE RELIEF) 0.2-0.2-1 % SOLN Place 1 drop into both eyes daily as needed (Dry eye).    haloperidol (HALDOL) 1 MG tablet Take 1 tablet (1 mg total) by mouth  every 8 (eight) hours as needed for agitation.    levothyroxine (SYNTHROID) 75 MCG tablet Take 1 tablet (75 mcg total) by mouth daily before breakfast.    potassium chloride SA (KLOR-CON) 20 MEQ tablet Take 1 tablet (20 mEq total) by mouth daily.    Rivaroxaban (XARELTO) 15 MG TABS tablet TAKE 1 TABLET(15 MG) BY MOUTH DAILY WITH BREAKFAST DISCONTINUE ELIQUIS    sertraline (ZOLOFT) 50 MG tablet Take 1 tablet (50 mg total) by mouth daily.    spironolactone (ALDACTONE) 25 MG tablet Take 1 tablet (25 mg total) by mouth every other day.    cephALEXin (KEFLEX) 500 MG capsule Take 1 capsule (500 mg total) by mouth 2 (two) times daily. (Patient not taking: Reported on 04/22/2021)    NONFORMULARY OR COMPOUNDED ITEM rollator walker  #1  As directed -------  Dx  CHF,  DOE,  OA    psyllium (METAMUCIL) 0.52 g capsule Take 0.52 g by mouth daily.    No facility-administered encounter medications on file as of 04/22/2021.    Patient Active Problem List   Diagnosis Date Noted   Left foot pain 02/10/2021   Weakness 02/10/2021   Urinary tract infection without hematuria 02/10/2021   Cellulitis 02/10/2021   Dementia (HCC) 02/10/2021   Gout 02/10/2021   SOB (shortness of breath) 02/05/2021   CKD (chronic kidney disease), stage III (HCC) 02/05/2021   Anxiety 12/24/2020   Impacted cerumen of right ear 12/24/2020   Acute on chronic diastolic CHF (congestive heart failure) (HCC) 11/26/2020   Bilateral leg edema 01/30/2020  COVID-19 virus infection 11/21/2019   Cough 11/21/2019   External hemorrhoid, bleeding 10/12/2019   AF (paroxysmal atrial fibrillation) (HCC) 10/02/2019   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 09/30/2019   Middle cerebral artery embolism, right 09/30/2019   Contusion of foot including toes, right, initial encounter 08/10/2018   Primary osteoarthritis of right knee 07/31/2018   Right leg swelling 07/17/2018   Pain of right calf 07/17/2018    Preventative health care 09/18/2017   Low back pain radiating to left leg 11/28/2016   Hyperlipidemia 02/02/2015   Sciatica 07/16/2014   Knee pain, acute 07/16/2014   Lower extremity pain, right 07/16/2014   Skin infection 07/16/2014   Incontinence of urine in female 06/19/2013   Cerebral aneurysm rupture (HCC) 04/08/2013   LEG EDEMA, RIGHT 07/06/2010   OTHER DRUG ALLERGY 07/06/2010   DIARRHEA 02/12/2010   HEMATURIA, HX OF 11/24/2009   ANEURYSM, HX OF 08/03/2009   B12 deficiency 07/24/2009   ACQUIRED HEMOLYTIC ANEMIA UNSPECIFIED 07/24/2009   SYNCOPE 07/20/2009   DIZZINESS 07/20/2009   Memory loss 07/20/2009   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   Hypothyroidism 12/14/2007   Essential hypertension 12/14/2007   DIVERTICULITIS, HX OF 12/14/2007   GOITER 01/30/2007   HYPERTENSION, BENIGN 01/30/2007   UTERINE POLYP 01/30/2007   THYROIDECTOMY, HX OF 01/30/2007    Conditions to be addressed/monitored:Atrial Fibrillation, CHF, HTN, HLD, Anxiety, Dementia, and Quality of Life  Care Plan : Cardiovascular Disease  Updates made by Colletta Maryland, RN since 04/22/2021 12:00 AM     Problem: Symptom managment of cardivascular disease   Priority: Medium     Long-Range Goal: Symptom Exacerbation Prevented or Minimized   Start Date: 01/07/2021  Expected End Date: 09/24/2021  This Visit's Progress: On track  Recent Progress: On track  Priority: Medium  Note:   Current Barriers:  Knowledge deficit related to long term care plan management of cardiovascular disease. Admission 02/04/21-02/08/21 with acute on chronic CHF, A-fib, HTN, UTI, CKD stage III, dementia. RNCM spoke with [name], daughter/DPR. Client continues to live with daughter, [name] and has around the clock supervision. [name] states that home health ended about a week ago. She reports Medicare is no longer paying for client's home health nursing and physical therapy to come in. She denies any cardiac issues at  this time. No signs/symptoms of exacerbation of cardiac disease. Last seen electrophysiologist re: atrial fibrillation on 04/01/21. Case Manager Clinical Goal(s):  Patient/caregiver will verbalize understanding of management of cardiovascular disease and when to call doctor Patient will take medications as prescribed. Patient/caregiver will have appropriate community resources/support Interventions:  Collaboration with [name], Grayling Congress, DO regarding development and update of comprehensive plan of care as evidenced by provider attestation and co-signature Inter-disciplinary care team collaboration (see longitudinal plan of care) Discussed management of cardiac disease. Medications Reviewed Patient Goals: - know when to call the doctor: increased weight gain more than 3 pounds overnight or 5 pounds in a week, increased swelling in stomach, hands, feet or legs, increased shortness of breath, if it is harder for you to breathe when lying down and need to sit up to breath or you feel that something is not right. New or worsening dizziness, racing or irregular heartbeat that may be uncomfortable -call provider for questions or concerns. -continue to eat healthy: low salt, whole grains, fruits and vegetables, lean meats and healthy fats. May eat smaller portions (4-6 times a day) if needed to  get nutrients in.  -continue to attend provider appointments as recommended -continue to take medications as prescribed Follow Up Plan: Telephone follow up appointment with care management team member scheduled for: 05/24/21 The patient has been provided with contact information for the care management team and has been advised to call with any health related questions or concerns.       Care Plan : Anxiety/Quality of Life  Updates made by Colletta Maryland, RN since 04/22/2021 12:00 AM     Problem: Quality of life affected by anxiety in patient with Chronic condition: Heart failure, HTN, atrial  fibrillation,memory loss   Priority: High     Long-Range Goal: Anxiety Symptoms Monitored and Managed Completed 04/22/2021  Start Date: 01/07/2021  Expected End Date: 09/21/2021  Recent Progress: On track  Priority: High  Note:   Current Barriers:  Long term care plan for management of Quality of Life. RNCM spoke with DPR, daughter [name], who reports continued support from siblings. She reports client's anxiety is currently managed. Nurse Case Manager Clinical Goal(s):  Patient/caregiver will verbalize understanding of plan for management of health, anxiety and disease processes Patient/caregiver will attend scheduled medical appointments  Interventions:  1:1 collaboration with [name], Grayling Congress, DO regarding development and update of comprehensive plan of care as evidenced by provider attestation and co-signature Inter-disciplinary care team collaboration (see longitudinal plan of care) Evaluation of current treatment plan related to disease processes and patient's adherence to plan as established by provider. Encouraged daughter to continue to  attend provider visits as scheduled, contact provider for any new or worsening symptoms. Reviewed medications Discussed plans with patient for ongoing care management follow up and provided patient with direct contact information for care management team embedded social work referral - currently keeping client in the home for as long as possible, but would like to know the placement process. Patient Goals/Self-Care Activities Continue to take medications as prescribed Continue to attend all scheduled provider appointments Continue to call pharmacy for medication refills Continue to provide meaningful experiences spending outdoor time or time in Yard/Garden and outings as tolerated.  Continue to maintain Fall prevention strategies: keep walkways clear, limit or take out throw rugs (could easily become trip hazards), keep good lighting in  the home, keep a flashlight near your bed, arrange solid pieces of furniture so that there may be a place to rest throughout the house if needed; change positions slowly. Grab bars in shower and/or next to toilet are useful. Avoid walking around barefoot and  don't walk around in slippers, stocking or socks. Instead wear low-heeled, comfortable shoes with rubber soles. Contact provider if you have fallen, even if there was no injury. Continue to work with home health agency staff as scheduled.  Patient will call provider office for new concerns or questions Work with Child psychotherapist as needed Follow Up Plan: daughter reports anxiety currently managed. Will include anxiety follow up under "quality of life maintained"    Long-Range Goal: Quality of Life Maintained   Start Date: 04/22/2021  Expected End Date: 09/22/2021  Priority: High  Note:   Current Barriers: Long term care plan for management of Quality of Life in a patient with chronic CHF, A-fib, HTN, UTI, CKD stage III, dementia, anxiety. Spoke with [name], daughter/DPR. Client continues to live with daughter, [name] and has around the clock supervision. [name] states that home health ended about a week ago. She reports Medicare is no longer paying for client's home health nursing and  physical therapy to come in. She reports she thinks client continues to have a bladder infection adding that client is passing a dark grayish mucous plugs in her urine. She also reports client has some skin breakdown on her bottom into the fold of her gluteals. She states they have been using petroleum jelly and Desitin ointment. Client is incontinent of urine and stool. Mrs. Rosanne Gutting states that client is able to get up with walker and spends the most of her time in the chair.   Ineffective Self Health Maintenance  Clinical Goal(s):  Collaboration with [name], Grayling Congress, DO regarding development and update of comprehensive plan of care as evidenced by  provider attestation and co-signature Inter-disciplinary care team collaboration (see longitudinal plan of care) Patient/caregiver will work with care management team to address care coordination and chronic disease management needs related to Disease Management, Educational Needs, Care Coordination, Dementia and Caregiver Support, Level of Care Concerns   Interventions:  1:1 collaboration with [name], Grayling Congress, DO regarding development and update of comprehensive plan of care as evidenced by provider attestation and co-signature Inter-disciplinary care team collaboration (see longitudinal plan of care) Evaluation of current treatment plan related to disease processes and patient's adherence to plan as established by provider. and patient's adherence to plan as established by provider. Discussed the importance of incontinence care: keeping skin dry, use of protective/barrier cream. Encouraged daughter to go to a medication supply store or one that sells incontenance suppliers to obtain barrier cream. Discussed importance of scheduling a provider visit re: skin breakdown and urine issues. Discussed palliative care with daughter/caregiver/DPR who is in agreement with referral if doctor is in agreement.  Per Mrs. Vinnefors, "anything that helps keep mama comfortable" Reached out to primary care provider re: palliative care referral Discussed plans with daughter/caregiver/DPR for ongoing care management follow up and provided patient with direct contact information for care management team Provided education re: pressure sores Patient Goals/Self-Care Activities Avoid staying in one position too long. Change position at least every two hours Use a skin sealant or moisture barrier cream, try to keep skin as dry as possible Continue to monitor skin for improvement or any new or worsening areas Good nutrition is important, eat foods high in calories and protein, stay hydrated,  Continue to take  medications as prescribed Continue to attend all scheduled provider appointments Continue to provide meaningful experiences  Continue to maintain Fall prevention strategies: keep walkways clear, limit or take out throw rugs (could easily become trip hazards), keep good lighting in the home, keep a flashlight near your bed, arrange solid pieces of furniture so that there may be a place to rest throughout the house if needed; change positions slowly. Grab bars in shower and/or next to toilet are useful. Avoid walking around barefoot and  don't walk around in slippers, stocking or socks. Instead wear low-heeled, comfortable shoes with rubber soles. Contact provider if you have fallen, even if there was no injury. Call your provider office for new concerns or questions Work with clinic social worker - scheduled telephone call 04/29/21 Call your [facility] Care Coordinator as needed Follow Up Plan: Telephone follow up appointment with care management team member scheduled for: 05/24/21 The patient has been provided with contact information for the care management team and has been advised to call with any health related questions or concerns.      Plan:Telephone follow up appointment with care management team member scheduled for:  05/24/21 and The patient has been provided  with contact information for the care management team and has been advised to call with any health related questions or concerns.   Kathyrn SheriffJuana Maye Parkinson, RN, MSN, BSN, CCM Care Management Coordinator [facility] [phone]

## 2021-04-22 NOTE — Patient Instructions (Addendum)
Visit Information  PATIENT GOALS:  Goals Addressed             This Visit's Progress    Quality of Life Maintained/Improved   On track    Timeframe:  Long-Range Goal Priority:  High Start Date:   01/07/21                          Expected End Date:   09/21/21                    Follow up date: 05/24/21  Patient Goals: For skin care: Avoid staying in one position too long. Change position at least every two hours; Use a skin sealant or moisture barrier cream, try to keep skin as dry as possible; Continue to monitor skin for improvement or any new or worsening areas; call provider to schedule office visit Good nutrition is important, eat foods high in calories and protein, stay hydrated, vegetables and fruits Continue to take medications as prescribed Continue to attend all scheduled provider appointments Continue to provide meaningful experiences  Continue to maintain Fall prevention strategies: keep walkways clear, limit or take out throw rugs (could easily become trip hazards), keep good lighting in the home, keep a flashlight near your bed, arrange solid pieces of furniture so that there may be a place to rest throughout the house if needed; change positions slowly. Grab bars in shower and/or next to toilet are useful. Avoid walking around barefoot and  don't walk around in slippers, stocking or socks. Instead wear low-heeled, comfortable shoes with rubber soles. Contact provider if you have fallen, even if there was no injury. Call your provider office for new concerns or questions Work with clinic social worker - scheduled telephone call 04/29/21 Call your [facility] Care Coordinator as needed Review educational material re: skin breakdown      Track and Manage cardiac signs/symptoms   On track    Timeframe:  Long-Range Goal Priority:  Medium Start Date:  01/07/21                         Expected End Date:  09/21/21                     Follow Up Date 05/24/21    - know when to call the  doctor: increased weight gain more than 3 pounds overnight or 5 pounds in a week, increased swelling in stomach, hands, feet or legs, increased shortness of breath, if it is harder for you to breathe when lying down and need to sit up to breath or you feel that something is not right. New or worsening dizziness, racing or irregular heartbeat that may be uncomfortable -call provider for questions or concerns. -continue to eat healthy: low salt, whole grains, fruits and vegetables, lean meats and healthy fats. May eat smaller portions (4-6 times a day) if needed to get nutrients in.  -continue to attend provider appointments as recommended -continue to take medications as prescribed   Why is this important?   You will be able to handle your symptoms better if you keep track of them.  Making some simple changes to your lifestyle will help.  Eating healthy is one thing you can do to take good care of yourself.    Notes:          Patient verbalizes understanding of instructions provided today and agrees to view  in MyChart.   Telephone follow up appointment with care management team member scheduled for:05/24/21 The patient has been provided with contact information for the care management team and has been advised to call with any health related questions or concerns.   Kathyrn Sheriff, RN, MSN, BSN, CCM Care Management Coordinator [facility] [phone]      Pressure Injury  A pressure injury is damage to the skin and underlying tissue that results from pressure being applied to an area of the body. It often affects people who mustspend a long time in a bed or chair because of a medical condition. Pressure injuries usually occur: Over bony parts of the body, such as the tailbone, shoulders, elbows, hips, heels, spine, ankles, and back of the head. Under medical devices that make contact with the body, such as respiratory equipment, stockings, tubes, and splints. Pressure  injuries start as reddened areas on the skin and can lead to pain andan open wound. What are the causes? This condition is caused by frequent or constant pressure to an area of the body. Decreased blood flow to the skin can eventually cause the skin tissue todie and break down, causing a wound. What increases the risk? You are more likely to develop this condition if you: Are in the hospital or an extended care facility. Are bedridden or in a wheelchair. Have an injury or disease that keeps you from: Moving normally. Feeling pain or pressure. Have a condition that: Makes you sleepy or less alert. Causes poor blood flow. Need to wear a medical device. Have poor control of your bladder or bowel functions (incontinence). Have poor nutrition (malnutrition). If you are at risk for pressure injuries, your health care provider may recommend certain types of mattresses, mattress covers, pillows, cushions, or boots to help prevent them. These may include products filled with air, foam,gel, or sand. What are the signs or symptoms? Symptoms of this condition depend on the severity of the injury. Symptoms may include: Red or dark areas of the skin. Pain, warmth, or a change of skin texture. Blisters. An open wound. How is this diagnosed? This condition is diagnosed with a medical history and physical exam. You may also have tests, such as: Blood tests. Imaging tests. Blood flow tests. Your pressure injury will be staged based on its severity. Staging is based on: The depth of the tissue injury, including whether there is exposure of muscle, bone, or tendon. The cause of the pressure injury. How is this treated? This condition may be treated by: Relieving or redistributing pressure on your skin. This includes: Frequently changing your position. Avoiding positions that caused the wound or that can make the wound worse. Using specific bed mattresses, chair cushions, or protective boots. Moving  medical devices from an area of pressure, or placing padding between the skin and the device. Using foams, creams, or powders to prevent rubbing (friction) on the skin. Keeping your skin clean and dry. This may include using a skin cleanser or skin barrier as told by your health care provider. Cleaning your injury and removing any dead tissue from the wound (debridement). Placing a bandage (dressing) over your injury. Using medicines for pain or to prevent or treat infection. Surgery may be needed if other treatments are not working or if your injury isvery deep. Follow these instructions at home: Wound care Follow instructions from your health care provider about how to take care of your wound. Make sure you: Wash your hands with soap and water  before and after you change your bandage (dressing). If soap and water are not available, use hand sanitizer. Change your dressing as told by your health care provider.  Check your wound every day for signs of infection. Have a caregiver do this for you if you are not able. Check for: Redness, swelling, or increased pain. More fluid or blood. Warmth. Pus or a bad smell. Skin care Keep your skin clean and dry. Gently pat your skin dry. Do not rub or massage your skin. You or a caregiver should check your skin every day for any changes in color or any new blisters or sores (ulcers). Medicines Take over-the-counter and prescription medicines only as told by your health care provider. If you were prescribed an antibiotic medicine, take or apply it as told by your health care provider. Do not stop using the antibiotic even if your condition improves. Reducing and redistributing pressure Do not lie or sit in one position for a long time. Move or change position every 1-2 hours, or as told by your health care provider. Use pillows or cushions to reduce pressure. Ask your health care provider to recommend cushions or pads for you. General  instructions  Eat a healthy diet that includes lots of protein. Drink enough fluid to keep your urine pale yellow. Be as active as you can every day. Ask your health care provider to suggest safe exercises or activities. Do not abuse drugs or alcohol. Do not use any products that contain nicotine or tobacco, such as cigarettes, e-cigarettes, and chewing tobacco. If you need help quitting, ask your health care provider. Keep all follow-up visits as told by your health care provider. This is important.  Contact a health care provider if: You have: A fever or chills. Pain that is not helped by medicine. Any changes in skin color. New blisters or sores. Pus or a bad smell coming from your wound. Redness, swelling, or pain around your wound. More fluid or blood coming from your wound. Your wound does not improve after 1-2 weeks of treatment. Summary A pressure injury is damage to the skin and underlying tissue that results from pressure being applied to an area of the body. Do not lie or sit in one position for a long time. Your health care provider may advise you to move or change position every 1-2 hours. Follow instructions from your health care provider about how to take care of your wound. Keep all follow-up visits as told by your health care provider. This is important. This information is not intended to replace advice given to you by your health care provider. Make sure you discuss any questions you have with your healthcare provider. Document Revised: 05/02/2018 Document Reviewed: 05/02/2018 Elsevier Patient Education  2022 [employer].

## 2021-04-23 ENCOUNTER — Ambulatory Visit (INDEPENDENT_AMBULATORY_CARE_PROVIDER_SITE_OTHER): Payer: Medicare Other | Admitting: Family

## 2021-04-23 ENCOUNTER — Other Ambulatory Visit: Payer: Self-pay

## 2021-04-23 ENCOUNTER — Ambulatory Visit: Payer: Medicare Other | Attending: Internal Medicine

## 2021-04-23 VITALS — BP 145/106 | HR 88 | Temp 97.7°F | Resp 18 | Wt 149.0 lb

## 2021-04-23 DIAGNOSIS — I10 Essential (primary) hypertension: Secondary | ICD-10-CM | POA: Diagnosis not present

## 2021-04-23 DIAGNOSIS — R8290 Unspecified abnormal findings in urine: Secondary | ICD-10-CM | POA: Diagnosis not present

## 2021-04-23 DIAGNOSIS — L89151 Pressure ulcer of sacral region, stage 1: Secondary | ICD-10-CM | POA: Diagnosis not present

## 2021-04-23 DIAGNOSIS — L304 Erythema intertrigo: Secondary | ICD-10-CM

## 2021-04-23 DIAGNOSIS — R4182 Altered mental status, unspecified: Secondary | ICD-10-CM

## 2021-04-23 DIAGNOSIS — Z23 Encounter for immunization: Secondary | ICD-10-CM

## 2021-04-23 LAB — POC URINALSYSI DIPSTICK (AUTOMATED)
Bilirubin, UA: NEGATIVE
Blood, UA: NEGATIVE
Glucose, UA: NEGATIVE
Ketones, UA: NEGATIVE
Leukocytes, UA: NEGATIVE
Nitrite, UA: POSITIVE
Protein, UA: POSITIVE — AB
Spec Grav, UA: 1.02 (ref 1.010–1.025)
Urobilinogen, UA: NEGATIVE E.U./dL — AB
pH, UA: 6 (ref 5.0–8.0)

## 2021-04-23 MED ORDER — NYSTATIN 100000 UNIT/GM EX POWD: 1.0000 "application " | Freq: Three times a day (TID) | CUTANEOUS | 1 refills | Status: DC

## 2021-04-23 MED ORDER — SULFAMETHOXAZOLE-TRIMETHOPRIM 400-80 MG PO TABS: 1.0000 | ORAL_TABLET | Freq: Two times a day (BID) | ORAL | 0 refills | Status: DC

## 2021-04-23 NOTE — Assessment & Plan Note (Signed)
New. Recommended gel cushion for her recliner. Daughter was given information on where to purchase.

## 2021-04-23 NOTE — Assessment & Plan Note (Signed)
UA is unremarkable, but CMA noted very foul odor. Will initiate bactrim (recently completed Keflex).  Urine will be sent for culture.

## 2021-04-23 NOTE — Assessment & Plan Note (Signed)
BP Readings from Last 3 Encounters:  04/23/21 (!) 145/106  04/01/21 120/70  03/09/21 110/72   BP is elevated today, but has been normal recently. Monitor.

## 2021-04-23 NOTE — Progress Notes (Signed)
   Covid-19 Vaccination Clinic  Name:  Belinda Anderson    MRN: [id number] DOB: [birth year]  04/23/2021  [name] was observed post Covid-19 immunization for 15 minutes without incident. She was provided with Vaccine Information Sheet and instruction to access the V-Safe system.   [name] was instructed to call 911 with any severe reactions post vaccine: Difficulty breathing  Swelling of face and throat  A fast heartbeat  A bad rash all over body  Dizziness and weakness   Immunizations Administered     Name Date Dose VIS Date Route   PFIZER Comrnaty(Gray TOP) Covid-19 Vaccine 04/23/2021 10:54 AM 0.3 mL 09/24/2020 Intramuscular   Manufacturer: [employer], [employer]   Lot: [id number]   NDC: [phone]

## 2021-04-23 NOTE — Progress Notes (Signed)
Subjective:   By signing my name below, I, Belinda Anderson, attest that this documentation has been prepared under the direction and in the presence of Belinda Anderson. 04/23/2021    Patient ID: Belinda Anderson, female    DOB: [birth year], 87 y.o.   MRN: [id number]  Chief Complaint  Patient presents with   Urine odor    Complains of bad urine odor   Open Wound    Patient complains of an open sacral wound    HPI Patient is in today for a office visit. Patient is accompanied with her daughter to hep with communication and transportation. History was provided by the daughter. She complains of a wound on her sacrum for the past couple of weeks. She recently recovered from shingles that she did not know she had. She also has urinary incontinence issues due to the pain.  UTI- Her urine issues persisted after she stopped taking keflex. Her urine odor continued since then. She also complains of left flank pain.  She was admitted to he hospital at the end of April for fluid issues and heart failure. She currently lives with her daughter and sees a CNA daily to manage her care.   Health Maintenance Due  Topic Date Due   FOOT EXAM  Never done   OPHTHALMOLOGY EXAM  Never done   URINE MICROALBUMIN  Never done   TETANUS/TDAP  Never done   Zoster Vaccines- Shingrix (1 of 2) Never done   DEXA SCAN  Never done   MAMMOGRAM  03/28/2013   COVID-19 Vaccine (4 - Booster for Pfizer series) 12/14/2020    Past Medical History:  Diagnosis Date   Arthritis    Atrial fibrillation (HCC)    Hypertension    Thyroid disease    TIA (transient ischemic attack)     Past Surgical History:  Procedure Laterality Date   CARDIOVERSION N/A 04/15/2020   Procedure: CARDIOVERSION;  Surgeon: Parke Poisson, MD;  Location: [facility] ENDOSCOPY;  Service: Cardiovascular;  Laterality: N/A;   EYE SURGERY     lens implant   IR ANGIO VERTEBRAL SEL SUBCLAVIAN INNOMINATE UNI R MOD SED  09/30/2019   IR CT HEAD LTD   09/30/2019   IR PERCUTANEOUS ART THROMBECTOMY/INFUSION INTRACRANIAL INC DIAG ANGIO  09/30/2019   RADIOLOGY WITH ANESTHESIA N/A 09/30/2019   Procedure: IR WITH ANESTHESIA;  Surgeon: Julieanne Cotton, MD;  Location: [facility] OR;  Service: Radiology;  Laterality: N/A;   TEE WITHOUT CARDIOVERSION N/A 04/15/2020   Procedure: TRANSESOPHAGEAL ECHOCARDIOGRAM (TEE);  Surgeon: Parke Poisson, MD;  Location: [facility] ENDOSCOPY;  Service: Cardiovascular;  Laterality: N/A;    Family History  Problem Relation Age of Onset   Arthritis Mother    Heart disease Mother    Heart disease Father 90       MI   Coronary artery disease Other    Diabetes Brother        DI    Social History   Socioeconomic History   Marital status: Widowed    Spouse name: Not on file   Number of children: 3   Years of education: Not on file   Highest education level: Not on file  Occupational History   Not on file  Tobacco Use   Smoking status: Never   Smokeless tobacco: Never  Vaping Use   Vaping Use: Never used  Substance and Sexual Activity   Alcohol use: No   Drug use: No   Sexual activity: Not Currently    Partners:  Male  Other Topics Concern   Not on file  Social History Narrative   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, Data processing manager   Social Determinants of Health   Financial Resource Strain: Not on file  Food Insecurity: No Food Insecurity   Worried About Programme researcher, broadcasting/film/video in the Last Year: Never true   Barista in the Last Year: Never true  Transportation Needs: No Transportation Needs   Lack of Transportation (Medical): No   Lack of Transportation (Non-Medical): No  Physical Activity: Not on file  Stress: Not on file  Social Connections: Not on file  Intimate Partner Violence: Not on file    Outpatient Medications Prior to Visit  Medication Sig Dispense Refill   acetaminophen (TYLENOL) 650 MG CR tablet Take 1,300 mg by mouth daily as needed for pain (arthritis).       diclofenac Sodium (VOLTAREN) 1 % GEL Apply 2 g topically daily as needed (pain). 150 g 3   diltiazem (CARDIZEM) 120 MG tablet Take 1 tablet (120 mg total) by mouth daily. 30 tablet 2   furosemide (LASIX) 20 MG tablet TAKE 2 TABLETS BY MOUTH EVERY DAY, MAY TAKE AN ADDITIONAL TABLET AS NEEDED FOR SWELLING. 270 tablet 2   Glycerin-Hypromellose-PEG 400 ([facility] DRY EYE RELIEF) 0.2-0.2-1 % SOLN Place 1 drop into both eyes daily as needed (Dry eye).     haloperidol (HALDOL) 1 MG tablet Take 1 tablet (1 mg total) by mouth every 8 (eight) hours as needed for agitation. 60 tablet 1   levothyroxine (SYNTHROID) 75 MCG tablet Take 1 tablet (75 mcg total) by mouth daily before breakfast. 90 tablet 0   NONFORMULARY OR COMPOUNDED ITEM rollator walker  #1  As directed -------  Dx  CHF,  DOE,  OA 1 each 0   potassium chloride SA (KLOR-CON) 20 MEQ tablet Take 1 tablet (20 mEq total) by mouth daily. 90 tablet 3   psyllium (METAMUCIL) 0.52 g capsule Take 0.52 g by mouth daily.     Rivaroxaban (XARELTO) 15 MG TABS tablet TAKE 1 TABLET(15 MG) BY MOUTH DAILY WITH BREAKFAST DISCONTINUE ELIQUIS 90 tablet 0   sertraline (ZOLOFT) 50 MG tablet Take 1 tablet (50 mg total) by mouth daily. 90 tablet 0   spironolactone (ALDACTONE) 25 MG tablet Take 1 tablet (25 mg total) by mouth every other day. 45 tablet 3   cephALEXin (KEFLEX) 500 MG capsule Take 1 capsule (500 mg total) by mouth 2 (two) times daily. (Patient not taking: Reported on 04/22/2021) 14 capsule 0   No facility-administered medications prior to visit.    No Known Allergies  Review of Systems  Genitourinary:  Positive for flank pain (Left).       (+)Incontinence   Musculoskeletal:  Positive for myalgias (Sacrum).  Skin:  Positive for rash. Itching: Sacrum.     Objective:    Physical Exam Constitutional:      General: She is not in acute distress.    Appearance: Normal appearance. She is not ill-appearing.  HENT:     Head: Normocephalic and atraumatic.      Right Ear: External ear normal.     Left Ear: External ear normal.  Eyes:     Extraocular Movements: Extraocular movements intact.     Pupils: Pupils are equal, round, and reactive to light.  Cardiovascular:     Rate and Rhythm: Normal rate. Rhythm irregular.     Pulses: Normal pulses.     Heart sounds: Normal  heart sounds. No murmur heard.   No gallop.  Pulmonary:     Effort: Pulmonary effort is normal. No respiratory distress.     Breath sounds: Normal breath sounds. No wheezing, rhonchi or rales.  Skin:    General: Skin is warm and dry.     Comments: Erythema noted in the gluteal fold.  Neurological:     Mental Status: She is alert and oriented to person, place, and time.  Psychiatric:        Behavior: Behavior normal.    BP (!) 145/106 (BP Location: Left Arm, Patient Position: Sitting, Cuff Size: Small)   Pulse 88   Temp 97.7 F (36.5 C) (Oral)   Resp 18   Wt 149 lb (67.6 kg)   SpO2 99%   BMI 25.58 kg/m  Wt Readings from Last 3 Encounters:  04/23/21 149 lb (67.6 kg)  04/01/21 145 lb 9.6 oz (66 kg)  03/09/21 153 lb 6.4 oz (69.6 kg)       Assessment & Plan:   Problem List Items Addressed This Visit       Unprioritized   Pressure ulcer of sacral region, stage 1    New. Recommended gel cushion for her recliner. Daughter was given information on where to purchase.        Intertrigo    There appears to be a fungal component to the erythema between the buttocks. Pt is incontinent. Recommended nystatin powder bid. Follow up in 2 weeks for recheck.        Essential hypertension    BP Readings from Last 3 Encounters:  04/23/21 (!) 145/106  04/01/21 120/70  03/09/21 110/72  BP is elevated today, but has been normal recently. Monitor.        Abnormal urine odor - Primary    UA is unremarkable, but CMA noted very foul odor. Will initiate bactrim (recently completed Keflex).  Urine will be sent for culture.        Relevant Orders   Urine Culture   POCT  Urinalysis Dipstick (Automated) (Completed)   Other Visit Diagnoses     Altered mental status, unspecified altered mental status type       Relevant Orders   Urine Culture        Meds ordered this encounter  Medications   sulfamethoxazole-trimethoprim (BACTRIM) 400-80 MG tablet    Sig: Take 1 tablet by mouth 2 (two) times daily.    Dispense:  10 tablet    Refill:  0    Order Specific Question:   Supervising Provider    Answer:   Danise Edge A [[id number]]   nystatin (MYCOSTATIN/NYSTOP) powder    Sig: Apply 1 application topically 3 (three) times daily.    Dispense:  60 g    Refill:  1    Order Specific Question:   Supervising Provider    Answer:   Danise Edge A [[id number]]    I, Belinda Anderson, personally preformed the services described in this documentation.  All medical record entries made by the scribe were at my direction and in my presence.  I have reviewed the chart and discharge instructions (if applicable) and agree that the record reflects my personal performance and is accurate and complete. 04/23/2021   I,Belinda Anderson,acting as a scribe for Lemont Fillers, Anderson.,have documented all relevant documentation on the behalf of Lemont Fillers, Anderson,as directed by  Lemont Fillers, Anderson while in the presence of Lemont Fillers, Anderson.   Lemont Fillers,  Anderson

## 2021-04-23 NOTE — Assessment & Plan Note (Signed)
There appears to be a fungal component to the erythema between the buttocks. Pt is incontinent. Recommended nystatin powder bid. Follow up in 2 weeks for recheck.

## 2021-04-23 NOTE — Patient Instructions (Addendum)
Please start bactrim twice daily. Apply nystatin powder generously between buttocks twice daily.  Please purchase a gel cushion for recliner. You can purchase this on [employer].  One good option is the Perfecushion Purple Gel Seat.

## 2021-04-25 LAB — URINE CULTURE
MICRO NUMBER:: 12096943
SPECIMEN QUALITY:: ADEQUATE

## 2021-04-29 ENCOUNTER — Ambulatory Visit: Payer: Medicare Other | Admitting: *Deleted

## 2021-04-29 DIAGNOSIS — F0281 Dementia in other diseases classified elsewhere with behavioral disturbance: Secondary | ICD-10-CM

## 2021-04-29 DIAGNOSIS — I10 Essential (primary) hypertension: Secondary | ICD-10-CM

## 2021-04-29 DIAGNOSIS — I480 Paroxysmal atrial fibrillation: Secondary | ICD-10-CM

## 2021-04-29 DIAGNOSIS — R42 Dizziness and giddiness: Secondary | ICD-10-CM

## 2021-04-29 DIAGNOSIS — R4182 Altered mental status, unspecified: Secondary | ICD-10-CM

## 2021-04-29 DIAGNOSIS — I509 Heart failure, unspecified: Secondary | ICD-10-CM

## 2021-04-29 DIAGNOSIS — R413 Other amnesia: Secondary | ICD-10-CM

## 2021-04-29 DIAGNOSIS — G309 Alzheimer's disease, unspecified: Secondary | ICD-10-CM

## 2021-04-29 NOTE — Patient Instructions (Signed)
Visit Information   PATIENT GOALS:   Goals Addressed             This Visit's Progress    Maintain My Quality of Life through [facility].   On track    Timeframe:  Short-Term Goal Priority:  High Start Date:  04/29/2021                         Expected End Date:   06/30/2021               Follow-Up Date: 05/06/2021 at 12:45pm.  Patient Goals/Self-Care Activities:  Follow-up with Rosealee Albee, Social Worker II with the [facility], to check the status of your application process. Work with [employer] on a weekly basis until approved for [employer], or until in-home care services are in place through a private agency of choice. Work with [facility] on a weekly basis until approved for long-term memory care assisted living placement. Review list of private agencies providing in-home care services, emailed to you by LCSW. Review list of long-term memory care assisted living facilities, emailed to you by LCSW. Remember: Always use handrails on the stairs. Always use your cane or walker when ambulating. Always wear your glasses, especially at night or in areas not well lit.            Consent to CCM Services: Belinda Anderson was given information about Chronic Care Management services today including:  CCM service includes personalized support from designated clinical staff supervised by her physician, including individualized plan of care and coordination with other care providers 24/7 contact phone numbers for assistance for urgent and routine care needs. Service will only be billed when office clinical staff spend 20 minutes or more in a month to coordinate care. Only one practitioner may furnish and bill the service in a calendar month. The patient may stop CCM services at any time (effective at the end of the month) by phone call to the office staff. The patient will be  responsible for cost sharing (co-pay) of up to 20% of the service fee (after annual deductible is met).  Patient agreed to services and verbal consent obtained.   Patient verbalizes understanding of instructions provided today and agrees to view in [place].   Telephone follow-up appointment with care management team member scheduled for:  05/06/2021 at 12:45pm.  Nat Christen LCSW Licensed Clinical Social Worker [place] [phone]   CLINICAL CARE PLAN: Patient Care Plan: Cardiovascular Disease     Problem Identified: Symptom managment of cardivascular disease   Priority: Medium     Long-Range Goal: Symptom Exacerbation Prevented or Minimized   Start Date: 01/07/2021  Expected End Date: 09/24/2021  This Visit's Progress: On track  Recent Progress: On track  Priority: Medium  Note:   Current Barriers:  Knowledge deficit related to long term care plan management of cardiovascular disease. Admission 02/04/21-02/08/21 with acute on chronic CHF, A-fib, HTN, UTI, CKD stage III, dementia. RNCM spoke with Belinda Anderson, daughter/DPR. Client continues to live with daughter, Belinda Anderson and has around the clock supervision. Ms. Belinda Anderson states that home health ended about a week ago. She reports Medicare is no longer paying for client's home health nursing and physical therapy to come in. She denies any cardiac issues at this time. No signs/symptoms of exacerbation of cardiac disease. Last seen electrophysiologist re: atrial fibrillation  on 04/01/21. Case Manager Clinical Goal(s):  Patient/caregiver will verbalize understanding of management of cardiovascular disease and when to call doctor Patient will take medications as prescribed. Patient/caregiver will have appropriate community resources/support Interventions:  Collaboration with [name], [name], DO regarding development and update of comprehensive plan of care as evidenced by provider attestation and  co-signature Inter-disciplinary care team collaboration (see longitudinal plan of care) Discussed management of cardiac disease. Medications Reviewed Patient Goals: - know when to call the doctor: increased weight gain more than 3 pounds overnight or 5 pounds in a week, increased swelling in stomach, hands, feet or legs, increased shortness of breath, if it is harder for you to breathe when lying down and need to sit up to breath or you feel that something is not right. New or worsening dizziness, racing or irregular heartbeat that may be uncomfortable -call provider for questions or concerns. -continue to eat healthy: low salt, whole grains, fruits and vegetables, lean meats and healthy fats. May eat smaller portions (4-6 times a day) if needed to get nutrients in.  -continue to attend provider appointments as recommended -continue to take medications as prescribed Follow Up Plan: Telephone follow up appointment with care management team member scheduled for: 05/24/21 The patient has been provided with contact information for the care management team and has been advised to call with any health related questions or concerns.       Patient Care Plan: Anxiety/Quality of Life     Problem Identified: Quality of life affected by anxiety in patient with Chronic condition: Heart failure, HTN, atrial fibrillation,memory loss   Priority: High     Long-Range Goal: Anxiety Symptoms Monitored and Managed Completed 04/22/2021  Start Date: 01/07/2021  Expected End Date: 09/21/2021  Recent Progress: On track  Priority: High  Note:   Current Barriers:  Long term care plan for management of Quality of Life. RNCM spoke with DPR, daughter Belinda Anderson, who reports continued support from siblings. She reports client's anxiety is currently managed. Nurse Case Manager Clinical Goal(s):  Patient/caregiver will verbalize understanding of plan for management of health, anxiety and disease  processes Patient/caregiver will attend scheduled medical appointments  Interventions:  1:1 collaboration with [name], [name], DO regarding development and update of comprehensive plan of care as evidenced by provider attestation and co-signature Inter-disciplinary care team collaboration (see longitudinal plan of care) Evaluation of current treatment plan related to disease processes and patient's adherence to plan as established by provider. Encouraged daughter to continue to  attend provider visits as scheduled, contact provider for any new or worsening symptoms. Reviewed medications Discussed plans with patient for ongoing care management follow up and provided patient with direct contact information for care management team embedded social work referral - currently keeping client in the home for as long as possible, but would like to know the placement process. Patient Goals/Self-Care Activities Continue to take medications as prescribed Continue to attend all scheduled provider appointments Continue to call pharmacy for medication refills Continue to provide meaningful experiences spending outdoor time or time in Yard/Garden and outings as tolerated.  Continue to maintain Fall prevention strategies: keep walkways clear, limit or take out throw rugs (could easily become trip hazards), keep good lighting in the home, keep a flashlight near your bed, arrange solid pieces of furniture so that there may be a place to rest throughout the house if needed; change positions slowly. Grab bars in shower and/or next to toilet are useful. Avoid walking around barefoot  and  don't walk around in slippers, stocking or socks. Instead wear low-heeled, comfortable shoes with rubber soles. Contact provider if you have fallen, even if there was no injury. Continue to work with home health agency staff as scheduled.  Patient will call provider office for new concerns or questions Work with Education officer, museum as  needed Follow Up Plan: daughter reports anxiety currently managed. Will include anxiety follow up under "quality of life maintained"    Long-Range Goal: Quality of Life Maintained   Start Date: 04/22/2021  Expected End Date: 09/22/2021  Priority: High  Note:   Current Barriers: Long term care plan for management of Quality of Life in a patient with chronic CHF, A-fib, HTN, UTI, CKD stage III, dementia, anxiety. Spoke with Belinda Anderson, daughter/DPR. Client continues to live with daughter, Belinda Anderson and has around the clock supervision. Ms. Belinda Anderson states that home health ended about a week ago. She reports Medicare is no longer paying for client's home health nursing and physical therapy to come in. She reports she thinks client continues to have a bladder infection adding that client is passing a Anderson grayish mucous plugs in her urine. She also reports client has some skin breakdown on her bottom into the fold of her gluteals. She states they have been using petroleum jelly and Desitin ointment. Client is incontinent of urine and stool. Belinda Anderson states that client is able to get up with walker and spends the most of her time in the chair.   Ineffective Self Health Maintenance  Clinical Goal(s):  Collaboration with [name], [name], DO regarding development and update of comprehensive plan of care as evidenced by provider attestation and co-signature Inter-disciplinary care team collaboration (see longitudinal plan of care) Patient/caregiver will work with care management team to address care coordination and chronic disease management needs related to Disease Management, Educational Needs, Care Coordination, Dementia and Caregiver Support, Level of Care Concerns   Interventions:  1:1 collaboration with [name], [name], DO regarding development and update of comprehensive plan of care as evidenced by provider attestation and co-signature Inter-disciplinary care team collaboration (see  longitudinal plan of care) Evaluation of current treatment plan related to disease processes and patient's adherence to plan as established by provider. and patient's adherence to plan as established by provider. Discussed the importance of incontinence care: keeping skin dry, use of protective/barrier cream. Encouraged daughter to go to a medication supply store or one that sells incontenance suppliers to obtain barrier cream. Discussed importance of scheduling a provider visit re: skin breakdown and urine issues. Discussed palliative care with daughter/caregiver/DPR who is in agreement with referral if doctor is in agreement.  Per Belinda Anderson, "anything that helps keep mama comfortable" Reached out to primary care provider re: palliative care referral Discussed plans with daughter/caregiver/DPR for ongoing care management follow up and provided patient with direct contact information for care management team Provided education re: pressure sores Patient Goals/Self-Care Activities Avoid staying in one position too long. Change position at least every two hours Use a skin sealant or moisture barrier cream, try to keep skin as dry as possible Continue to monitor skin for improvement or any new or worsening areas Good nutrition is important, eat foods high in calories and protein, stay hydrated,  Continue to take medications as prescribed Continue to attend all scheduled provider appointments Continue to provide meaningful experiences  Continue to maintain Fall prevention strategies: keep walkways clear, limit or take out throw rugs (could easily become trip hazards),  keep good lighting in the home, keep a flashlight near your bed, arrange solid pieces of furniture so that there may be a place to rest throughout the house if needed; change positions slowly. Grab bars in shower and/or next to toilet are useful. Avoid walking around barefoot and  don't walk around in slippers, stocking or socks.  Instead wear low-heeled, comfortable shoes with rubber soles. Contact provider if you have fallen, even if there was no injury. Call your provider office for new concerns or questions Work with clinic social worker - scheduled telephone call 04/29/21 Call your [facility] Care Coordinator as needed Follow Up Plan: Telephone follow up appointment with care management team member scheduled for: 05/24/21 The patient has been provided with contact information for the care management team and has been advised to call with any health related questions or concerns.     Patient Care Plan: LCSW Plan of Care.     Problem Identified: Maintain My Quality of Life through Assisted Living Placement.   Priority: High     Goal: Maintain My Quality of Life through [facility].   Start Date: 04/29/2021  Expected End Date: 06/30/2021  This Visit's Progress: On track  Priority: High  Note:   Current Barriers:   Patient with CHF, A-Fib, HTN, Anxiety, Dementia, Stage III Chronic Kidney Disease, History of Stroke, Osteoarthritis of Right Knee and Caregiver Stress needs Support, Education, and Care Coordination to resolve unmet personal care needs in the home. Patient is unable to self-administer medications as prescribed. Patient is unable to consistently perform ADL's/IADL's independently. Financial constraints related to inability to pay for in-home care services out-of-pocket. Patient is not eligible to receive funding for in-home care services through Adult Medicaid with the [place], due to exceeding income guidelines. Level of care concerns, as daughter is trying to decide between arranging in-home care services versus placing patient into a long-term memory care assisted living facility. Patient refuses to attend an Adult Day Care Program, of any kind. Lacks knowledge of available community agencies and resources. Clinical Goals:  Over  the next 30 to 45 days, patient will have in-home care services in place, either through Rosealee Albee, Education officer, museum II with the [facility], or through a private agency of choice. Over the next 30-45 days, patient and daughter will decide whether or not they wish to pursue in-home care services for patient or place patient into a long-term memory care assisted living facility. Patient and daughter will work with LCSW and Rosealee Albee, Social Worker II with the [facility], to coordinate care for in-home aide services. Patient and daughter will receive, review and consider arranging in-home care services through a private agency of choice, from the list emailed to them by LCSW, and notify LCSW if they need assistance with the referral process.   Patient and daughter will receive, review and consider arranging long-term memory care assisted living placement for patient, from the list emailed to them by LCSW, and notify LCSW if they need assistance with the referral process. Collaboration with patient's Primary Care Physician, Dr. Garnet Koyanagi to request completion of an FL-2 Form for placement purposes. Patient will attend all scheduled medical appointments as evidenced by patient report and care team review of appointment completion in electronic medical record. Patient will demonstrate improved health management independence as evidenced by having in-home  care services in place. Clinical Interventions: Patient and daughter interviewed and appropriate assessments performed. Collaboration with patient and daughter regarding development and update of comprehensive plan of care as evidenced by provider attestation and co-signature. Inter-disciplinary care team collaboration (see longitudinal plan of care). Interventions performed:  Problem Solving/Task Centered,  Psychoeducation/Health Education, Quality of Sleep Assessed and Sleep Hygiene Techniques Promoted, Caregiver Stress Acknowledged and Consideration of In-Home Care Services Encouraged and Memory Care Assisted Living Placement Encouraged. Referral placed to Rosealee Albee, Social Worker II with the [facility]. Explained to patient and daughter that applications for the [employer] are screened by a Education officer, museum, through the [place], over the phone, and that client's are assessed at 3 different levels to determine what their needs are, whether home management or personal care services.   LCSW further explained to patient and daughter, that based on the level they are assessed, they can receive between 4-10 hours of in-home care services per week and that the number of hours determines the number of days they will receive services during that week.  Individuals can be disabled or aged, but cannot receive Adult Medicaid, Veteran Aide and Attendance Benefits, or any other services that would qualify them where in-home aide services are available.   Discussed plans with patient and daughter for ongoing care management follow-up and provided direct contact information for care management team. Assisted patient and daughter with obtaining information about health plan benefits through [facility]. Provided education to patient and daughter regarding level of care options. Assessed needs, level of care concerns, basic eligibility and provided education on In-Home Aide Program process, Private In-Home Aide process and long-term memory care assisted living placement process. LCSW collaboration with Rosealee Albee, Social Worker II with the [facility], to verify application is received and processed. Identified  resources and durable medical equipment needed in the home to improve safety and promote independence. LCSW collaboration with Primary Care Physician, Dr. Garnet Koyanagi to request completion of FL-2 Form. LCSW will obtain Chest X-Ray results and submit to facility chosen for long-term memory care assisted living placement. LCSW collaboration with Primary Care Physician, Dr. Garnet Koyanagi to request initiation of a COVID-19 Screening Test within 72 hours of admission into a long-term memory care assisted living facility. Patient Goals/Self-Care Activities:  Follow-up with Rosealee Albee, Social Worker II with the [facility] [facility], to check the status of your application process. Work with [employer] on a weekly basis until approved for [employer], or until in-home care services are in place through a private agency of choice. Work with [employer] on a weekly basis until approved for long-term memory care assisted living placement. Review list of private agencies providing in-home care services, emailed to you by LCSW. Review list of long-term memory care assisted living facilities, emailed to you by LCSW. Remember: Always use handrails on the stairs. Always use your cane or walker when ambulating. Always wear your glasses, especially at night or in areas not well lit.  Follow Up Plan: LCSW will follow-up with patient and daughter by telephone on 05/06/2021 at 12:45pm.

## 2021-04-29 NOTE — Chronic Care Management (AMB) (Signed)
Chronic Care Management    Clinical Social Work Note  04/29/2021 Name: Belinda Anderson MRN: [id number] DOB: 07/28/1932  Belinda Anderson is a 87 y.o. year old female who is a primary care patient of [name], DO. The CCM team was consulted to assist the patient with chronic disease management and/or care coordination needs related to: [employer], Level of Care Concerns, and Caregiver Stress.   Engaged with patient and daughter by telephone for initial visit in response to provider referral for social work chronic care management and care coordination services.   Consent to Services:  The patient was given information about Chronic Care Management services, agreed to services, and gave verbal consent prior to initiation of services.  Please see initial visit note for detailed documentation.   Patient agreed to services and consent obtained.   Assessment: Review of patient past medical history, allergies, medications, and health status, including review of relevant consultants reports was performed today as part of a comprehensive evaluation and provision of chronic care management and care coordination services.     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  Daughter is patient's Interior and spatial designer.  CCM Care Plan  No Known Allergies  Outpatient Encounter Medications as of 04/29/2021  Medication Sig Note   acetaminophen (TYLENOL) 650 MG CR tablet Take 1,300 mg by mouth daily as needed for pain (arthritis).     diclofenac Sodium (VOLTAREN) 1 % GEL Apply 2 g topically daily as needed (pain).    diltiazem (CARDIZEM) 120 MG tablet Take 1 tablet (120 mg total) by mouth daily.    furosemide (LASIX) 20 MG tablet TAKE 2 TABLETS BY MOUTH EVERY DAY, MAY TAKE AN ADDITIONAL TABLET AS NEEDED FOR SWELLING. 04/22/2021: Reports taking 40 mg before breakfast and 20 mg with lunch for a total of 60 mg daily   Glycerin-Hypromellose-PEG 400 ([employer] DRY EYE RELIEF) 0.2-0.2-1 % SOLN Place 1 drop into both eyes daily as needed (Dry eye).    haloperidol (HALDOL) 1 MG tablet Take 1 tablet (1 mg total) by mouth every 8 (eight) hours as needed for agitation.    levothyroxine (SYNTHROID) 75 MCG tablet Take 1 tablet (75 mcg total) by mouth daily before breakfast.    NONFORMULARY OR COMPOUNDED ITEM rollator walker  #1  As directed -------  Dx  CHF,  DOE,  OA    nystatin (MYCOSTATIN/NYSTOP) powder Apply 1 application topically 3 (three) times daily.    potassium chloride SA (KLOR-CON) 20 MEQ tablet Take 1 tablet (20 mEq total) by mouth daily.    psyllium (METAMUCIL) 0.52 g capsule Take 0.52 g by mouth daily.    Rivaroxaban (XARELTO) 15 MG TABS tablet TAKE 1 TABLET(15 MG) BY MOUTH DAILY WITH BREAKFAST DISCONTINUE ELIQUIS    sertraline (ZOLOFT) 50 MG tablet Take 1 tablet (50 mg total) by mouth daily.    spironolactone (ALDACTONE) 25 MG tablet Take 1 tablet (25 mg total) by mouth every other day.  sulfamethoxazole-trimethoprim (BACTRIM) 400-80 MG tablet Take 1 tablet by mouth 2 (two) times daily.    No facility-administered encounter medications on file as of 04/29/2021.    Patient Active Problem List   Diagnosis Date Noted   Pressure ulcer of sacral region, stage 1 04/23/2021   Intertrigo 04/23/2021    Abnormal urine odor 04/23/2021   Left foot pain 02/10/2021   Weakness 02/10/2021   Urinary tract infection without hematuria 02/10/2021   Cellulitis 02/10/2021   Dementia (HCC) 02/10/2021   Gout 02/10/2021   SOB (shortness of breath) 02/05/2021   CKD (chronic kidney disease), stage III (HCC) 02/05/2021   Anxiety 12/24/2020   Impacted cerumen of right ear 12/24/2020   Acute on chronic diastolic CHF (congestive heart failure) (HCC) 11/26/2020   Bilateral leg edema 01/30/2020   COVID-19 virus infection 11/21/2019   Cough 11/21/2019   External hemorrhoid, bleeding 10/12/2019   AF (paroxysmal atrial fibrillation) (HCC) 10/02/2019   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 09/30/2019   Middle cerebral artery embolism, right 09/30/2019   Contusion of foot including toes, right, initial encounter 08/10/2018   Primary osteoarthritis of right knee 07/31/2018   Right leg swelling 07/17/2018   Pain of right calf 07/17/2018   Preventative health care 09/18/2017   Low back pain radiating to left leg 11/28/2016   Hyperlipidemia 02/02/2015   Sciatica 07/16/2014   Knee pain, acute 07/16/2014   Lower extremity pain, right 07/16/2014   Skin infection 07/16/2014   Incontinence of urine in female 06/19/2013   Cerebral aneurysm rupture (HCC) 04/08/2013   LEG EDEMA, RIGHT 07/06/2010   OTHER DRUG ALLERGY 07/06/2010   DIARRHEA 02/12/2010   HEMATURIA, HX OF 11/24/2009   ANEURYSM, HX OF 08/03/2009   B12 deficiency 07/24/2009   ACQUIRED HEMOLYTIC ANEMIA UNSPECIFIED 07/24/2009   SYNCOPE 07/20/2009   DIZZINESS 07/20/2009   Memory loss 07/20/2009   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   Hypothyroidism 12/14/2007   Essential hypertension 12/14/2007   DIVERTICULITIS, HX OF 12/14/2007   GOITER 01/30/2007   HYPERTENSION, BENIGN 01/30/2007   UTERINE POLYP 01/30/2007   THYROIDECTOMY, HX OF 01/30/2007    Conditions to be addressed/monitored: HTN, CKD Stage  III, and Dementia.  Limited Social Support, Level of Care Concerns, Medication Procurement, ADL/IADL Limitations, Limited Access to Caregiver, Cognitive Deficits, Memory Deficits, and Lacks Knowledge of [employer].  Care Plan : LCSW Plan of Care.  Updates made by Karolee Stamps, LCSW since 04/29/2021 12:00 AM     Problem: Maintain My Quality of Life through Assisted Living Placement.   Priority: High     Goal: Maintain My Quality of Life through In-Home Care Services [facility].   Start Date: 04/29/2021  Expected End Date: 06/30/2021  This Visit's Progress: On track  Priority: High  Note:   Current Barriers:   Patient with CHF, A-Fib, HTN, Anxiety, Dementia, Stage III Chronic Kidney Disease, History of Stroke, Osteoarthritis of Right Knee and Caregiver Stress needs Support, Education, and Care Coordination to resolve unmet personal care needs in the home. Patient is unable to self-administer medications as prescribed. Patient is unable to consistently perform ADL's/IADL's independently. Financial constraints related to inability to pay for in-home care services out-of-pocket. Patient is not eligible to receive funding for in-home care services through Adult Medicaid with the [facility] of Social Services, due to exceeding income guidelines. Level of care concerns, as daughter is trying to decide between arranging in-home care  services versus placing patient into a long-term memory care assisted living facility. Patient refuses to attend an Adult Day Care Program, of any kind. Lacks knowledge of available community agencies and resources. Clinical Goals:  Over the next 30 to 45 days, patient will have in-home care services in place, either through Juventino Slovak, Child psychotherapist II with the [employer] of [employer] and [employer], or through a private agency of choice. Over the next 30-45 days, patient and  daughter will decide whether or not they wish to pursue in-home care services for patient or place patient into a long-term memory care assisted living facility. Patient and daughter will work with LCSW and Juventino Slovak, Social Worker II with the [employer] [facility] [employer] and [employer], to coordinate care for in-home aide services. Patient and daughter will receive, review and consider arranging in-home care services through a private agency of choice, from the list emailed to them by LCSW, and notify LCSW if they need assistance with the referral process.   Patient and daughter will receive, review and consider arranging long-term memory care assisted living placement for patient, from the list emailed to them by LCSW, and notify LCSW if they need assistance with the referral process. Collaboration with patient's Primary Care Physician, Dr. Loreen Freud to request completion of an FL-2 Form for placement purposes. Patient will attend all scheduled medical appointments as evidenced by patient report and care team review of appointment completion in electronic medical record. Patient will demonstrate improved health management independence as evidenced by having in-home care services in place. Clinical Interventions: Patient and daughter interviewed and appropriate assessments performed. Collaboration with patient and daughter regarding development and update of comprehensive plan of care as evidenced by provider attestation and co-signature. Inter-disciplinary care team collaboration (see longitudinal plan of care). Interventions performed:  Problem Solving/Task Centered, Psychoeducation/Health Education, Quality of Sleep Assessed and Sleep Hygiene Techniques Promoted, Caregiver Stress Acknowledged and Consideration of In-Home Care Services Encouraged and Memory Care Assisted Living Placement Encouraged. Referral placed to Juventino Slovak, Social Worker II with the  [facility] of Health and [employer] In-Home Aide Program. Explained to patient and daughter that applications for the [facility] are screened by a Child psychotherapist, through the [employer] [facility] [employer] and [employer], over the phone, and that client's are assessed at 3 different levels to determine what their needs are, whether home management or personal care services.   LCSW further explained to patient and daughter, that based on the level they are assessed, they can receive between 4-10 hours of in-home care services per week and that the number of hours determines the number of days they will receive services during that week.  Individuals can be disabled or aged, but cannot receive Adult Medicaid, Veteran Aide and Attendance Benefits, or any other services that would qualify them where in-home aide services are available.   Discussed plans with patient and daughter for ongoing care management follow-up and provided direct contact information for care management team. Assisted patient and daughter with obtaining information about health plan benefits through [facility]. Provided education to patient and daughter regarding level of care options. Assessed needs, level of care concerns, basic eligibility and provided education on [address] process, Private In-Home Aide process and long-term memory care assisted living placement process. LCSW collaboration with Juventino Slovak, Social Worker II with the [employer] and [employer]  Aide Program, to verify application is received and processed. Identified resources and durable medical equipment needed in the home to improve safety and promote independence. LCSW collaboration with Primary Care Physician, Dr. Loreen FreudYvonne Lowne to request completion of FL-2 Form. LCSW will obtain Chest X-Ray results and submit to facility chosen for long-term memory care  assisted living placement. LCSW collaboration with Primary Care Physician, Dr. Loreen FreudYvonne Lowne to request initiation of a COVID-19 Screening Test within 72 hours of admission into a long-term memory care assisted living facility. Patient Goals/Self-Care Activities:  Follow-up with Juventino SlovakIlka Covington, Social Worker II with the [facility] and [employer] In-Home Aide Program, to check the status of your application process. Work with Johnson [employer] JohnsonLCSW on a weekly basis until approved for [facility] Aide Program, or until in-home care services are in place through a private agency of choice. Work with [employer] JohnsonLCSW on a weekly basis until approved for long-term memory care assisted living placement. Review list of private agencies providing in-home care services, emailed to you by LCSW. Review list of long-term memory care assisted living facilities, emailed to you by LCSW. Remember: Always use handrails on the stairs. Always use your cane or walker when ambulating. Always wear your glasses, especially at night or in areas not well lit.  Follow Up Plan: LCSW will follow-up with patient and daughter by telephone on 05/06/2021 at 12:45pm.       Follow Up Plan:  LCSW will follow-up with patient by phone on 05/06/2021 at 12:45pm.      Danford BadJoanna Patrcia Schnepp LCSW Licensed Clinical Social Worker [facility] [phone]

## 2021-04-30 ENCOUNTER — Other Ambulatory Visit (HOSPITAL_BASED_OUTPATIENT_CLINIC_OR_DEPARTMENT_OTHER): Payer: Self-pay

## 2021-04-30 MED ORDER — COVID-19 MRNA VAC-TRIS(PFIZER) 30 MCG/0.3ML IM SUSP
INTRAMUSCULAR | 0 refills | Status: DC
  Filled 2021-04-30: qty 0.3, 1d supply, fill #0

## 2021-05-06 ENCOUNTER — Telehealth: Payer: Self-pay | Admitting: *Deleted

## 2021-05-06 ENCOUNTER — Telehealth: Payer: Medicare Other | Admitting: *Deleted

## 2021-05-06 NOTE — Telephone Encounter (Cosign Needed)
  Care Management   Follow Up Note   05/06/2021 Name: Belinda Anderson MRN: [id number] DOB: May 07, 1932   Referred by: [name], DO Reason for referral : Chronic Care Management in Patient with CHF, A-Fib, HTN, Anxiety, Dementia, Stage III Chronic Kidney Disease, History of Stroke and Osteoarthritis of Right Knee. Unsuccessful Follow-Up Outreach Call with Patient and Patient's Daughter, [name].   LCSW made an attempt to try and contact patient and patient's daughter, [name] today, to follow-up regarding social work services and resources, without success.  The patient has been provided with contact information for the care management team and has been advised to call with any health related questions or concerns.   A HIPAA compliant message was left on voicemail and LCSW continues to await a return call.  LCSW will make a second outreach attempt again next week, if a return call is not received from [name] in the meantime.  Follow-Up Plan:  05/14/2021 at 10:00am.  Danford Bad LCSW Licensed Clinical Social Worker [facility] [phone]

## 2021-05-07 ENCOUNTER — Encounter: Payer: Self-pay | Admitting: Family Medicine

## 2021-05-07 ENCOUNTER — Other Ambulatory Visit: Payer: Self-pay

## 2021-05-07 ENCOUNTER — Ambulatory Visit (INDEPENDENT_AMBULATORY_CARE_PROVIDER_SITE_OTHER): Payer: Medicare Other | Admitting: Family Medicine

## 2021-05-07 VITALS — BP 120/60 | HR 99 | Temp 97.5°F | Resp 18 | Ht 64.0 in | Wt 149.4 lb

## 2021-05-07 DIAGNOSIS — R5383 Other fatigue: Secondary | ICD-10-CM | POA: Diagnosis not present

## 2021-05-07 DIAGNOSIS — L89301 Pressure ulcer of unspecified buttock, stage 1: Secondary | ICD-10-CM

## 2021-05-07 DIAGNOSIS — Z111 Encounter for screening for respiratory tuberculosis: Secondary | ICD-10-CM

## 2021-05-07 DIAGNOSIS — F0280 Dementia in other diseases classified elsewhere without behavioral disturbance: Secondary | ICD-10-CM

## 2021-05-07 DIAGNOSIS — G309 Alzheimer's disease, unspecified: Secondary | ICD-10-CM | POA: Diagnosis not present

## 2021-05-07 LAB — CBC WITH DIFFERENTIAL/PLATELET
Basophils Absolute: 0 10*3/uL (ref 0.0–0.1)
Basophils Relative: 0.5 % (ref 0.0–3.0)
Eosinophils Absolute: 0.1 10*3/uL (ref 0.0–0.7)
Eosinophils Relative: 1.2 % (ref 0.0–5.0)
HCT: 43.9 % (ref 36.0–46.0)
Hemoglobin: 14.2 g/dL (ref 12.0–15.0)
Lymphocytes Relative: 19.9 % (ref 12.0–46.0)
Lymphs Abs: 1.5 10*3/uL (ref 0.7–4.0)
MCHC: 32.4 g/dL (ref 30.0–36.0)
MCV: 87.2 fl (ref 78.0–100.0)
Monocytes Absolute: 0.9 10*3/uL (ref 0.1–1.0)
Monocytes Relative: 12.1 % — ABNORMAL HIGH (ref 3.0–12.0)
Neutro Abs: 5.1 10*3/uL (ref 1.4–7.7)
Neutrophils Relative %: 66.3 % (ref 43.0–77.0)
Platelets: 274 10*3/uL (ref 150.0–400.0)
RBC: 5.04 Mil/uL (ref 3.87–5.11)
RDW: 17.3 % — ABNORMAL HIGH (ref 11.5–15.5)
WBC: 7.7 10*3/uL (ref 4.0–10.5)

## 2021-05-07 LAB — COMPREHENSIVE METABOLIC PANEL
ALT: 9 U/L (ref 0–35)
AST: 15 U/L (ref 0–37)
Albumin: 4.1 g/dL (ref 3.5–5.2)
Alkaline Phosphatase: 138 U/L — ABNORMAL HIGH (ref 39–117)
BUN: 28 mg/dL — ABNORMAL HIGH (ref 6–23)
CO2: 27 mEq/L (ref 19–32)
Calcium: 9.2 mg/dL (ref 8.4–10.5)
Chloride: 104 mEq/L (ref 96–112)
Creatinine, Ser: 1.88 mg/dL — ABNORMAL HIGH (ref 0.40–1.20)
GFR: 23.84 mL/min — ABNORMAL LOW (ref >60.00)
Glucose, Bld: 65 mg/dL — ABNORMAL LOW (ref 70–99)
Potassium: 4.7 mEq/L (ref 3.5–5.1)
Sodium: 139 mEq/L (ref 135–145)
Total Bilirubin: 0.5 mg/dL (ref 0.2–1.2)
Total Protein: 6.4 g/dL (ref 6.0–8.3)

## 2021-05-07 LAB — TSH: TSH: 1.6 u[IU]/mL (ref 0.35–5.50)

## 2021-05-07 LAB — VITAMIN B12: Vitamin B-12: 212 pg/mL (ref 211–911)

## 2021-05-07 MED ORDER — NYSTATIN 100000 UNIT/GM EX POWD: 1.0000 "application " | Freq: Three times a day (TID) | CUTANEOUS | 3 refills | Status: DC

## 2021-05-07 NOTE — Progress Notes (Addendum)
Patient ID: Belinda Anderson, female    DOB: 1932/03/22  Age: 87 y.o. MRN: [id number]    Subjective:  Subjective  HPI Belinda Anderson presents for an office visit today accompanied by her daughter. She complains of wound under her L breast. Her daughter notes that she is beginning to develop a sore under her L breast per CNA. Her daughter states that her sores on her buttock had improved and is no longer raw. Her daughter notes applying nystatin powder TID on her buttock, however she hasn't apply any under her breast, since she was out of the powder.  She complains of fatigue and weakness. Her daughter notes that she has been more lethargic lately. She endorses taking 20 mg Lasix PO BID. Her daughter states that her BP is well with a lower dose of lasix.  BP Readings from Last 3 Encounters:  05/07/21 120/60  04/23/21 (!) 145/106  04/01/21 120/70  She notes that her urinary sxs had improved. Her daughter states that she isn't tolerating dairy and is switching to almond milk or alternative products. Her daughter notice that her diarrhea had improved without dairy. Pt has incontinent.  She denies any chest pain, SOB, fever, abdominal pain, cough, chills, sore throat, dysuria, urinary incontinence, back pain, HA, or N/V/D at this time.   Review of Systems  Constitutional:  Positive for fatigue. Negative for chills and fever.  HENT:  Negative for ear pain, rhinorrhea, sinus pressure, sinus pain, sore throat and tinnitus.   Eyes:  Negative for pain.  Respiratory:  Negative for cough, shortness of breath and wheezing.   Cardiovascular:  Negative for chest pain.  Gastrointestinal:  Negative for abdominal pain, anal bleeding, constipation, diarrhea, nausea and vomiting.       (+) incontinent   Genitourinary:  Negative for flank pain.  Musculoskeletal:  Negative for back pain and neck pain.  Skin:  Positive for wound (Under L breast). Negative for rash.       (+) sore on her buttock     Neurological:  Positive for weakness. Negative for seizures, light-headedness, numbness and headaches.   History Past Medical History:  Diagnosis Date   Arthritis    Atrial fibrillation (HCC)    Hypertension    Thyroid disease    TIA (transient ischemic attack)     She has a past surgical history that includes Eye surgery; Radiology with anesthesia (N/A, 09/30/2019); IR PERCUTANEOUS ART THROMBECTOMY/INFUSION INTRACRANIAL INC DIAG ANGIO (09/30/2019); IR CT Head Ltd (09/30/2019); IR ANGIO VERTEBRAL SEL SUBCLAVIAN INNOMINATE UNI R MOD SED (09/30/2019); TEE without cardioversion (N/A, 04/15/2020); and Cardioversion (N/A, 04/15/2020).   Her family history includes Arthritis in her mother; Coronary artery disease in an other family member; Diabetes in her brother; Heart disease in her mother; Heart disease (age of onset: 48) in her father.She reports that she has never smoked. She has never used smokeless tobacco. She reports that she does not drink alcohol and does not use drugs.  Current Outpatient Medications on File Prior to Visit  Medication Sig Dispense Refill   acetaminophen (TYLENOL) 650 MG CR tablet Take 1,300 mg by mouth daily as needed for pain (arthritis).      COVID-19 mRNA Vac-TriS, Pfizer, SUSP injection Inject into the muscle. 0.3 mL 0   diclofenac Sodium (VOLTAREN) 1 % GEL Apply 2 g topically daily as needed (pain). 150 g 3   diltiazem (CARDIZEM) 120 MG tablet Take 1 tablet (120 mg total) by mouth daily. 30 tablet 2   furosemide (LASIX)  20 MG tablet TAKE 2 TABLETS BY MOUTH EVERY DAY, MAY TAKE AN ADDITIONAL TABLET AS NEEDED FOR SWELLING. 270 tablet 2   Glycerin-Hypromellose-PEG 400 ([employer] DRY EYE RELIEF) 0.2-0.2-1 % SOLN Place 1 drop into both eyes daily as needed (Dry eye).     haloperidol (HALDOL) 1 MG tablet Take 1 tablet (1 mg total) by mouth every 8 (eight) hours as needed for agitation. 60 tablet 1   levothyroxine (SYNTHROID) 75 MCG tablet Take 1 tablet (75 mcg total) by mouth  daily before breakfast. 90 tablet 0   NONFORMULARY OR COMPOUNDED ITEM rollator walker  #1  As directed -------  Dx  CHF,  DOE,  OA 1 each 0   potassium chloride SA (KLOR-CON) 20 MEQ tablet Take 1 tablet (20 mEq total) by mouth daily. 90 tablet 3   Rivaroxaban (XARELTO) 15 MG TABS tablet TAKE 1 TABLET(15 MG) BY MOUTH DAILY WITH BREAKFAST DISCONTINUE ELIQUIS 90 tablet 0   sertraline (ZOLOFT) 50 MG tablet Take 1 tablet (50 mg total) by mouth daily. 90 tablet 0   spironolactone (ALDACTONE) 25 MG tablet Take 1 tablet (25 mg total) by mouth every other day. 45 tablet 3   sulfamethoxazole-trimethoprim (BACTRIM) 400-80 MG tablet Take 1 tablet by mouth 2 (two) times daily. 10 tablet 0   No current facility-administered medications on file prior to visit.     Objective:  Objective  Physical Exam Vitals and nursing note reviewed.  Constitutional:      General: She is not in acute distress.    Appearance: Normal appearance. She is well-developed. She is not ill-appearing.  HENT:     Head: Normocephalic and atraumatic.     Right Ear: External ear normal.     Left Ear: External ear normal.     Nose: Nose normal.  Eyes:     General:        Right eye: No discharge.        Left eye: No discharge.     Extraocular Movements: Extraocular movements intact.     Pupils: Pupils are equal, round, and reactive to light.  Cardiovascular:     Rate and Rhythm: Normal rate and regular rhythm.     Pulses: Normal pulses.     Heart sounds: Normal heart sounds. No murmur heard.   No friction rub. No gallop.  Pulmonary:     Effort: Pulmonary effort is normal. No respiratory distress.     Breath sounds: Normal breath sounds. No stridor. No wheezing, rhonchi or rales.  Chest:     Chest wall: No tenderness.  Abdominal:     General: Bowel sounds are normal. There is no distension.     Palpations: Abdomen is soft. There is no mass.     Tenderness: There is no abdominal tenderness. There is no guarding or rebound.      Hernia: No hernia is present.  Musculoskeletal:        General: Normal range of motion.     Cervical back: Normal range of motion and neck supple.     Right lower leg: No edema.     Left lower leg: No edema.  Skin:    General: Skin is warm and dry.     Findings: Erythema and rash present.     Comments: There is erythema present in the gluteal region. There is rash located under her L breast.   Neurological:     Mental Status: She is alert and oriented to person, place, and time.  Psychiatric:  Behavior: Behavior normal.        Thought Content: Thought content normal.   BP 120/60 (BP Location: Left Arm, Patient Position: Sitting, Cuff Size: Normal)   Pulse 99   Temp (!) 97.5 F (36.4 C) (Oral)   Resp 18   Ht 5\' 4"  (1.626 m)   Wt 149 lb 6.4 oz (67.8 kg)   SpO2 100%   BMI 25.64 kg/m  Wt Readings from Last 3 Encounters:  05/07/21 149 lb 6.4 oz (67.8 kg)  04/23/21 149 lb (67.6 kg)  04/01/21 145 lb 9.6 oz (66 kg)     Lab Results  Component Value Date   WBC 7.9 02/09/2021   HGB 12.7 02/09/2021   HCT 38.9 02/09/2021   PLT 279.0 02/09/2021   GLUCOSE 100 (H) 03/08/2021   CHOL 117 01/30/2020   TRIG 75.0 01/30/2020   HDL 33.80 (L) 01/30/2020   LDLDIRECT 97.0 04/06/2018   LDLCALC 68 01/30/2020   ALT 13 02/09/2021   AST 23 02/09/2021   NA 140 03/08/2021   K 4.8 03/08/2021   CL 102 03/08/2021   CREATININE 1.58 (H) 03/08/2021   BUN 18 03/08/2021   CO2 23 03/08/2021   TSH 1.738 02/05/2021   INR 1.6 (H) 02/04/2021   HGBA1C 6.1 11/26/2020    DG Foot Complete Left  Result Date: 02/10/2021 CLINICAL DATA:  Left foot pain and swelling EXAM: LEFT FOOT - COMPLETE 3+ VIEW COMPARISON:  None. FINDINGS: There is no evidence of fracture or dislocation. There is no evidence of arthropathy or other focal bone abnormality. Soft tissues are unremarkable. IMPRESSION: Negative. Electronically Signed   By: 02/12/2021 M.D.   On: 02/10/2021 09:36     Assessment & Plan:  Plan     Meds ordered this encounter  Medications   nystatin (MYCOSTATIN/NYSTOP) powder    Sig: Apply 1 application topically 3 (three) times daily.    Dispense:  60 g    Refill:  3    Problem List Items Addressed This Visit       Unprioritized   Dementia (HCC)   Relevant Orders   Ambulatory referral to Home Health   Other Visit Diagnoses     Pressure injury of buttock, stage 1, unspecified laterality    -  Primary   Relevant Medications   nystatin (MYCOSTATIN/NYSTOP) powder   Other Relevant Orders   Ambulatory referral to Home Health   Tuberculin skin test encounter       Relevant Orders   PPD   Other fatigue       Relevant Orders   CBC with Differential/Platelet   TSH   Vitamin B12   Comprehensive metabolic panel   POCT Urinalysis Dipstick (Automated)       Follow-up: Return if symptoms worsen or fail to improve.   I,Gordon Zheng,acting as a 02/12/2021 for Neurosurgeon, DO.,have documented all relevant documentation on the behalf of [employer], DO,as directed by  [name], DO while in the presence of [name], DO.   I, [name], DO, have reviewed all documentation for this visit. The documentation on 05/07/21 for the exam, diagnosis, procedures, and orders are all accurate and complete.

## 2021-05-07 NOTE — Patient Instructions (Signed)
Preventing Pressure Injuries  A pressure injury, sometimes called a bedsore or a pressure ulcer, is an injury to the skin and underlying tissue caused by pressure. A pressure injury can happen when your skin presses against a surface, such as a mattress or wheelchair seat, for too long. The pressure on the blood vessels causes reduced blood flow to your skin. This can eventually cause the skin tissue to die andbreak down into a wound. Pressure injuries usually develop: Over bony parts of the body, such as the tailbone, shoulders, elbows, hips, and heels. Under medical devices, such as respiratory equipment, stockings, tubes, and splints. How can this condition affect me? Pressure injuries are caused by a lack of blood supply to an area of skin. These injuries begin as a reddened area on the skin and can become an open sore. They can result from intense pressure over a short period of time or fromless pressure over a long period of time. Pressure injuries can vary in severity. They can cause pain, muscle damage, andinfection. What can increase my risk? This condition is more likely to develop in people who: Are in the hospital or an extended care facility. Are bedridden or in a wheelchair. Have an injury or disease that keeps them from: Moving normally. Feeling pain or pressure. Communicating if they feel pain or pressure. Have a condition that: Makes them sleepy or less alert. Causes poor blood flow. Need to wear a medical device. Have poor control of their bladder or bowel functions (incontinence). Have poor nutrition (malnutrition). Have had this condition before. Are of certain ethnicities. People of African American, Latino, or Hispanic descent are at higher risk compared to other ethnic groups. What actions can I take to prevent pressure injuries? Reducing and redistributing pressure Do not lie or sit in one position for a long time. Move or change position: Every hour when out of  bed in a chair. Every two hours when in bed. As often as told by your health care provider. Use pillows, wedges, or cushions to redistribute pressure. Ask your health care provider to recommend a mattress, cushions, or pads for you. Use medical devices that do not rub your skin. Tell your health care provider if one of your medical devices is causing pain or irritation. Skin care If you are in the hospital, your health care providers: Will inspect your skin, including areas under or around medical devices, at least twice a day. May recommend that you use certain types of bedding to help prevent pressure injuries. These may include a pad, mattress, or chair cushion that is filled with gel, air, water, or foam. Will evaluate your nutrition and consult a dietitian if needed. Will inspect and change any wound dressings regularly. May help you move into different positions every few hours. Will adjust any medical devices and braces as needed to limit pressure on your skin. Will keep your skin clean and dry. May use gentle cleansers and skin protectants if you are incontinent. Will moisturize any dry skin. In general, at home: Keep your skin clean and dry. Gently pat your skin dry. Do not rub or massage bony areas of your skin. Moisturize dry skin. Use gentle cleansers and skin protectants routinely if you are incontinent. Check your skin at least once a day for any changes in color and for any new blisters or sores. Make sure to check under and around any medical devices and between skin folds. Have a caregiver do this for you if you are not   able.  Lifestyle Be as active as you can every day. Ask your health care provider to suggest safe exercises or activities. Do not abuse drugs or alcohol. Do not use any products that contain nicotine or tobacco, such as cigarettes, e-cigarettes, and chewing tobacco. If you need help quitting, ask your health care provider. General instructions  Take  over-the-counter and prescription medicines only as told by your health care provider. Work with your health care provider to manage any chronic health conditions. Eat a healthy diet that includes protein, vitamins, and minerals. Ask your health care provider what types of food you should eat. Drink enough fluid to keep your urine pale yellow. Keep all follow-up visits as told by your health care provider. This is important.  Contact a health care provider if you: Feel or see any changes in your skin. Summary A pressure injury, sometimes called a bedsore or a pressure ulcer, is an injury to the skin and underlying tissue caused by pressure. Do not lie or sit in one position for a long time. Check your skin at least once a day for any changes in color and for any new blisters or sores. Make sure to check under and around any medical devices and between skin folds. Have a caregiver do this for you if you are not able. Eat a healthy diet that includes protein, vitamins, and minerals. Ask your health care provider what types of food you should eat. This information is not intended to replace advice given to you by your health care provider. Make sure you discuss any questions you have with your healthcare provider. Document Revised: 01/25/2019 Document Reviewed: 06/26/2018 Elsevier Patient Education  2022 Elsevier Inc.  

## 2021-05-08 ENCOUNTER — Other Ambulatory Visit: Payer: Self-pay | Admitting: Family Medicine

## 2021-05-09 ENCOUNTER — Other Ambulatory Visit: Payer: Self-pay | Admitting: Family Medicine

## 2021-05-10 ENCOUNTER — Other Ambulatory Visit: Payer: Self-pay | Admitting: Family Medicine

## 2021-05-10 DIAGNOSIS — F0281 Dementia in other diseases classified elsewhere with behavioral disturbance: Secondary | ICD-10-CM

## 2021-05-10 DIAGNOSIS — G309 Alzheimer's disease, unspecified: Secondary | ICD-10-CM

## 2021-05-10 LAB — TB SKIN TEST: TB Skin Test: NEGATIVE

## 2021-05-10 NOTE — Addendum Note (Signed)
Addended by: Thelma Barge D on: 05/10/2021 02:45 PM   Modules accepted: Orders

## 2021-05-13 ENCOUNTER — Encounter: Payer: Self-pay | Admitting: Family Medicine

## 2021-05-14 ENCOUNTER — Other Ambulatory Visit: Payer: Self-pay | Admitting: Family Medicine

## 2021-05-14 ENCOUNTER — Ambulatory Visit: Payer: Medicare Other | Admitting: *Deleted

## 2021-05-14 DIAGNOSIS — F419 Anxiety disorder, unspecified: Secondary | ICD-10-CM

## 2021-05-14 DIAGNOSIS — R531 Weakness: Secondary | ICD-10-CM

## 2021-05-14 DIAGNOSIS — R413 Other amnesia: Secondary | ICD-10-CM

## 2021-05-14 DIAGNOSIS — F0390 Unspecified dementia without behavioral disturbance: Secondary | ICD-10-CM

## 2021-05-14 DIAGNOSIS — M5431 Sciatica, right side: Secondary | ICD-10-CM

## 2021-05-14 DIAGNOSIS — B029 Zoster without complications: Secondary | ICD-10-CM

## 2021-05-14 DIAGNOSIS — F0280 Dementia in other diseases classified elsewhere without behavioral disturbance: Secondary | ICD-10-CM

## 2021-05-14 DIAGNOSIS — R0602 Shortness of breath: Secondary | ICD-10-CM

## 2021-05-14 DIAGNOSIS — I10 Essential (primary) hypertension: Secondary | ICD-10-CM

## 2021-05-14 DIAGNOSIS — I509 Heart failure, unspecified: Secondary | ICD-10-CM

## 2021-05-14 DIAGNOSIS — F0281 Dementia in other diseases classified elsewhere with behavioral disturbance: Secondary | ICD-10-CM

## 2021-05-14 DIAGNOSIS — G309 Alzheimer's disease, unspecified: Secondary | ICD-10-CM

## 2021-05-14 DIAGNOSIS — R4182 Altered mental status, unspecified: Secondary | ICD-10-CM

## 2021-05-14 DIAGNOSIS — N1832 Chronic kidney disease, stage 3b: Secondary | ICD-10-CM

## 2021-05-14 DIAGNOSIS — I63411 Cerebral infarction due to embolism of right middle cerebral artery: Secondary | ICD-10-CM

## 2021-05-14 DIAGNOSIS — Z111 Encounter for screening for respiratory tuberculosis: Secondary | ICD-10-CM

## 2021-05-14 DIAGNOSIS — R5383 Other fatigue: Secondary | ICD-10-CM

## 2021-05-14 MED ORDER — DILTIAZEM HCL 60 MG PO TABS: 60.0000 mg | ORAL_TABLET | Freq: Every day | ORAL | 3 refills | Status: DC

## 2021-05-14 MED ORDER — VALACYCLOVIR HCL 1 G PO TABS: 1000.0000 mg | ORAL_TABLET | Freq: Three times a day (TID) | ORAL | 0 refills | Status: AC

## 2021-05-14 NOTE — Patient Instructions (Signed)
Visit Information  PATIENT GOALS:  Goals Addressed             This Visit's Progress    Maintain My Quality of Life through In-Home Care Services Versus Assisted Living Placement.   On track    Timeframe:  Short-Term Goal Priority:  High Start Date:  04/29/2021                         Expected End Date:   06/30/2021               Follow-Up Date: 05/28/2021 at 10:00am.  Patient Goals/Self-Care Activities:  Daughter will continue to follow-up with Juventino Slovak, Social Worker II with the [facility] Department of Health and [employer], to check the status of your application process. Daughter will continue to call private agency sitters to try and arrange services, from the list of In-Home Care and Respite Agencies, provided to her by LCSW. Daughter will continue to call private agency sitters to try and arrange services, from the list of Home Health Care Agencies, provided to her by LCSW.   Continue to work with [employer] on a bi-weekly basis until approved for in-home care services, either through the [employer], or until in-home care services are in place through a private agency of choice. LCSW will notify your Primary Care Physician, Dr. Loreen Freud that your shingles have flared up again, and that you need a prescription for Valtrex.        Patient verbalizes understanding of instructions provided today and agrees to view in MyChart.   Telephone follow-up appointment with care management team member scheduled for:  05/28/2021 at 10:00am.  Danford Bad LCSW Licensed Clinical Social Worker [facility] [phone]

## 2021-05-14 NOTE — Chronic Care Management (AMB) (Signed)
Chronic Care Management    Clinical Social Work Note  05/14/2021 Name: Belinda Anderson MRN: [id number] DOB: 10-27-1931  Belinda Anderson is a 87 y.o. year old female who is a primary care patient of [name], DO. The CCM team was consulted to assist the patient with chronic disease management and/or care coordination needs related to: [employer]  Level of Care Concerns, and Caregiver Stress.   Engaged with patient and daughter by telephone for follow-up visit in response to provider referral for social work chronic care management and care coordination services.   Consent to Services:  The patient was given information about Chronic Care Management services, agreed to services, and gave verbal consent prior to initiation of services.  Please see initial visit note for detailed documentation.   Patient agreed to services and consent obtained.   Assessment: Review of patient past medical history, allergies, medications, and health status, including review of relevant consultants reports was performed today as part of a comprehensive evaluation and provision of chronic care management and care coordination services.     SDOH (Social Determinants of Health) assessments and interventions performed:    Advanced Directives Status: Not addressed in this encounter.  CCM Care Plan  No Known Allergies  Outpatient Encounter Medications as of 05/14/2021  Medication Sig Note   acetaminophen (TYLENOL) 650 MG CR tablet Take 1,300 mg by mouth daily as needed for pain (arthritis).     COVID-19 mRNA Vac-TriS, Pfizer, SUSP injection Inject into the muscle.    diclofenac Sodium (VOLTAREN) 1 % GEL Apply 2 g topically daily as needed (pain).    diltiazem (CARDIZEM) 60 MG tablet Take 1 tablet (60 mg total) by mouth daily.    furosemide (LASIX) 20 MG tablet TAKE 2 TABLETS BY MOUTH EVERY DAY, MAY TAKE AN ADDITIONAL TABLET AS NEEDED FOR SWELLING. 04/22/2021: Reports taking 40 mg before  breakfast and 20 mg with lunch for a total of 60 mg daily   Glycerin-Hypromellose-PEG 400 ([employer] DRY EYE RELIEF) 0.2-0.2-1 % SOLN Place 1 drop into both eyes daily as needed (Dry eye).    haloperidol (HALDOL) 1 MG tablet TAKE 1 TABLET(1 MG) BY MOUTH EVERY 8 HOURS AS NEEDED FOR AGITATION    levothyroxine (SYNTHROID) 75 MCG tablet Take 1 tablet (75 mcg total) by mouth daily before breakfast.    NONFORMULARY OR COMPOUNDED ITEM rollator walker  #1  As directed -------  Dx  CHF,  DOE,  OA    nystatin (MYCOSTATIN/NYSTOP) powder Apply 1 application topically 3 (three) times daily.    potassium chloride SA (KLOR-CON) 20 MEQ tablet Take 1 tablet (20 mEq total) by mouth daily.    sertraline (ZOLOFT) 50 MG tablet TAKE 1 TABLET(50 MG) BY MOUTH DAILY    spironolactone (ALDACTONE) 25 MG tablet Take 1 tablet (25 mg total) by mouth every other day.    sulfamethoxazole-trimethoprim (BACTRIM) 400-80 MG tablet Take 1 tablet by mouth 2 (two) times daily.    XARELTO 15 MG TABS tablet TAKE 1 TABLET(15 MG) BY MOUTH DAILY WITH BREAKFAST. DISCONTINUE ELIQUIS    [DISCONTINUED] diltiazem (CARDIZEM) 120 MG tablet Take 1 tablet (120 mg total) by mouth daily.    No facility-administered encounter medications on file as of 05/14/2021.    Patient Active Problem List   Diagnosis Date Noted   Pressure ulcer of sacral region, stage 1 04/23/2021   Intertrigo 04/23/2021   Abnormal urine odor 04/23/2021   Left foot pain 02/10/2021   Weakness 02/10/2021   Urinary tract  infection without hematuria 02/10/2021   Cellulitis 02/10/2021   Dementia (HCC) 02/10/2021   Gout 02/10/2021   SOB (shortness of breath) 02/05/2021   CKD (chronic kidney disease), stage III (HCC) 02/05/2021   Anxiety 12/24/2020   Impacted cerumen of right ear 12/24/2020   Acute on chronic diastolic CHF (congestive heart failure) (HCC) 11/26/2020   Bilateral leg edema 01/30/2020   COVID-19 virus infection 11/21/2019   Cough 11/21/2019   External hemorrhoid,  bleeding 10/12/2019   AF (paroxysmal atrial fibrillation) (HCC) 10/02/2019   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 09/30/2019   Middle cerebral artery embolism, right 09/30/2019   Contusion of foot including toes, right, initial encounter 08/10/2018   Primary osteoarthritis of right knee 07/31/2018   Right leg swelling 07/17/2018   Pain of right calf 07/17/2018   Preventative health care 09/18/2017   Low back pain radiating to left leg 11/28/2016   Hyperlipidemia 02/02/2015   Sciatica 07/16/2014   Knee pain, acute 07/16/2014   Lower extremity pain, right 07/16/2014   Skin infection 07/16/2014   Incontinence of urine in female 06/19/2013   Cerebral aneurysm rupture (HCC) 04/08/2013   LEG EDEMA, RIGHT 07/06/2010   OTHER DRUG ALLERGY 07/06/2010   DIARRHEA 02/12/2010   HEMATURIA, HX OF 11/24/2009   ANEURYSM, HX OF 08/03/2009   B12 deficiency 07/24/2009   ACQUIRED HEMOLYTIC ANEMIA UNSPECIFIED 07/24/2009   SYNCOPE 07/20/2009   DIZZINESS 07/20/2009   Memory loss 07/20/2009   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  Limited Social Support, Level of Care Concerns, ADL/IADL Limitations, Limited Access to Caregiver, Cognitive Deficits, Memory Deficits, and Lacks Knowledge of [employer] Resources.  Care Plan : LCSW Plan of Care.  Updates made by Karolee StampsSaporito, Rigo Letts D, LCSW since 05/14/2021 12:00 AM     Problem: Maintain My Quality of Life through Assisted Living Placement.   Priority: High     Goal: Maintain My Quality of Life through In-Home Care Services [facility] Assisted Living Placement.   Start Date: 04/29/2021  Expected End Date: 06/30/2021  This Visit's  Progress: On track  Recent Progress: On track  Priority: High  Note:   Current Barriers:   Patient with CHF, A-Fib, HTN, Anxiety, Dementia, Stage III Chronic Kidney Disease, History of Stroke, Osteoarthritis of Right Knee and Caregiver Stress needs Support, Education, and Care Coordination to resolve unmet personal care needs in the home. Patient is unable to self-administer medications as prescribed. Patient is unable to consistently perform ADL's/IADL's independently. Financial constraints related to inability to pay for in-home care services out-of-pocket. Patient is not eligible to receive funding for in-home care services through Adult Medicaid with the [place] [facility] [facility] [place], due to exceeding income guidelines. Level of care concerns, as daughter is trying to decide between arranging in-home care services versus placing patient into a long-term memory care assisted living facility. Patient refuses to attend an Adult Day Care Program, of any kind. Lacks knowledge of available community agencies and resources. Clinical Goals:  Over the next 30 to 45 days, patient will have in-home care services in place, either through Juventino SlovakIlka Covington, Child psychotherapistocial Worker II with the [employer] [place] [facility] Health and [employer] In-Home Aide  Program, or through a private agency of choice. Over the next 30-45 days, patient and daughter will decide whether or not they wish to pursue in-home care services for patient or place patient into a long-term memory care assisted living facility. Patient and daughter will work with LCSW and Juventino Slovak, Social Worker II with the [employer] of Health and [employer], to coordinate care for in-home aide services. Patient and daughter will receive, review and consider arranging in-home care services through a private agency of choice, from the list emailed to them by LCSW, and notify LCSW if they need  assistance with the referral process.   Patient and daughter will receive, review and consider arranging long-term memory care assisted living placement for patient, from the list emailed to them by LCSW, and notify LCSW if they need assistance with the referral process. Collaboration with patient's Primary Care Physician, Dr. Loreen Freud to request completion of an FL-2 Form for placement purposes. Patient will attend all scheduled medical appointments as evidenced by patient report and care team review of appointment completion in electronic medical record. Patient will demonstrate improved health management independence as evidenced by having in-home care services in place. Clinical Interventions: Patient and daughter interviewed and appropriate assessments performed. Collaboration with patient and daughter regarding development and update of comprehensive plan of care as evidenced by provider attestation and co-signature. Inter-disciplinary care team collaboration (see longitudinal plan of care). Interventions performed:  Problem Solving/Task Centered, Psychoeducation/Health Education, Quality of Sleep Assessed and Sleep Hygiene Techniques Promoted, Caregiver Stress Acknowledged and Consideration of In-Home Care Services Encouraged and Memory Care Assisted Living Placement Encouraged. Referral placed to Juventino Slovak, Social Worker II with the [facility] Department of Health and [employer] In-Home Aide Program. Explained to patient and daughter that applications for the [facility] are screened by a Child psychotherapist, through the [employer] of Health and [employer], over the phone, and that client's are assessed at 3 different levels to determine what their needs are, whether home management or personal care services.   LCSW further explained to patient and daughter, that based on the level they are assessed, they can receive between 4-10 hours of in-home care  services per week and that the number of hours determines the number of days they will receive services during that week.  Individuals can be disabled or aged, but cannot receive Adult Medicaid, Veteran Aide and Attendance Benefits, or any other services that would qualify them where in-home aide services are available.   Discussed plans with patient and daughter for ongoing care management follow-up and provided direct contact information for care management team. Assisted patient and daughter with obtaining information about health plan benefits through [facility]. Provided education to patient and daughter regarding level of care options. Assessed needs, level of care concerns, basic eligibility and provided education on [address] process, Private In-Home Aide process and long-term memory care assisted living placement process. LCSW collaboration with Juventino Slovak, Social Worker II with the [facility] Department of Health and [employer], to verify application is received and processed. Identified resources and durable medical equipment needed in the home to improve safety and promote independence. LCSW collaboration with Primary Care Physician, Dr. Loreen Freud to request completion of FL-2 Form. LCSW will obtain Chest X-Ray results and submit to facility chosen for long-term memory care assisted living placement. LCSW collaboration with Primary Care Physician, Dr. Loreen Freud to request  initiation of a COVID-19 Screening Test within 72 hours of admission into a long-term memory care assisted living facility. LCSW collaboration with Primary Care Physician, Dr. Loreen Freud to request prescription for Valtrex for shingles flare-up. Patient Goals/Self-Care Activities:  Daughter will continue to follow-up with Juventino Slovak, Social Worker II with the [facility] Department of Health and [employer], to  check the status of your application process. Daughter will continue to call private agency sitters to try and arrange services, from the list of In-Home Care and Respite Agencies, provided to her by LCSW. Daughter will continue to call private agency sitters to try and arrange services, from the list of Home Health Care Agencies, provided to her by LCSW.   Continue to work with [employer] on a bi-weekly basis until approved for in-home care services, either through the [employer], or until in-home care services are in place through a private agency of choice. LCSW will notify your Primary Care Physician, Dr. Loreen Freud that your shingles have flared up again, and that you need a prescription for Valtrex. Follow-Up Plan: LCSW will follow-up with patient and daughter by telephone on 05/28/2021 at 10:00am.       Follow-Up Plan:  LCSW will follow-up with patient and daughter by phone on 05/28/2021 at 10:00am.      Danford Bad LCSW Licensed Clinical Social Worker [facility] [phone]

## 2021-05-17 ENCOUNTER — Ambulatory Visit: Payer: Medicare Other | Admitting: Family

## 2021-05-24 ENCOUNTER — Telehealth: Payer: Medicare Other

## 2021-05-28 ENCOUNTER — Other Ambulatory Visit: Payer: Self-pay | Admitting: Cardiology

## 2021-05-28 ENCOUNTER — Ambulatory Visit (INDEPENDENT_AMBULATORY_CARE_PROVIDER_SITE_OTHER): Payer: Medicare Other | Admitting: *Deleted

## 2021-05-28 DIAGNOSIS — N1832 Chronic kidney disease, stage 3b: Secondary | ICD-10-CM

## 2021-05-28 DIAGNOSIS — G309 Alzheimer's disease, unspecified: Secondary | ICD-10-CM

## 2021-05-28 DIAGNOSIS — R413 Other amnesia: Secondary | ICD-10-CM

## 2021-05-28 DIAGNOSIS — F0280 Dementia in other diseases classified elsewhere without behavioral disturbance: Secondary | ICD-10-CM

## 2021-05-28 DIAGNOSIS — R42 Dizziness and giddiness: Secondary | ICD-10-CM

## 2021-05-28 DIAGNOSIS — F419 Anxiety disorder, unspecified: Secondary | ICD-10-CM

## 2021-05-28 DIAGNOSIS — I10 Essential (primary) hypertension: Secondary | ICD-10-CM

## 2021-05-28 DIAGNOSIS — R531 Weakness: Secondary | ICD-10-CM

## 2021-05-28 DIAGNOSIS — R0602 Shortness of breath: Secondary | ICD-10-CM

## 2021-05-28 DIAGNOSIS — F0281 Dementia in other diseases classified elsewhere with behavioral disturbance: Secondary | ICD-10-CM

## 2021-05-28 NOTE — Patient Instructions (Signed)
Visit Information  PATIENT GOALS:  Goals Addressed             This Visit's Progress    Maintain My Quality of Life through In-Home Care Services Versus Assisted Living Placement.   On track    Timeframe:  Short-Term Goal Priority:  High Start Date:  04/29/2021                         Expected End Date:   06/30/2021               Follow-Up Date: 06/10/2021 at 3:30pm  Patient Goals/Self-Care Activities:  Daughter will continue to follow-up with Juventino Slovak, Social Worker II with the [facility] Department of Health and [employer], to check the status of your application process. Daughter will continue to call private agency sitters to try and arrange services, from the list of Home Health Care Agencies and In-Home Care and Respite Agencies, provided to her by LCSW.   Continue to work with [employer] on a weekly/bi-weekly basis until approved for in-home care services, either through the [employer], or until in-home care services are in place through a private agency of choice.        Patient verbalizes understanding of instructions provided today and agrees to view in MyChart.   Follow-Up:  06/10/2021 at 3:30pm  Danford Bad LCSW Licensed Clinical Social Worker [facility] [phone]

## 2021-05-28 NOTE — Chronic Care Management (AMB) (Signed)
Chronic Care Management    Clinical Social Work Note  05/28/2021 Name: Belinda Anderson MRN: [id number] DOB: [birth year]  Belinda Anderson is a 87 y.o. year old female who is a primary care patient of Donato SchultzLowne Chase, Yvonne R, DO. The CCM team was consulted to assist the patient with chronic disease management and/or care coordination needs related to: Level of Care Concerns and Caregiver Stress.   LCSW contacted patient's daughter for follow-up visit in response to provider referral for social work chronic care management and care coordination services.   Consent to Services:  The patient was given information about Chronic Care Management services, agreed to services, and gave verbal consent prior to initiation of services.  Please see initial visit note for detailed documentation.   Patient agreed to services and consent obtained.   Assessment: Review of patient past medical history, allergies, medications, and health status, including review of relevant consultants reports was performed today as part of a comprehensive evaluation and provision of chronic care management and care coordination services.     SDOH (Social Determinants of Health) assessments and interventions performed:    Advanced Directives Status: Not addressed in this encounter.  CCM Care Plan  No Known Allergies  Outpatient Encounter Medications as of 05/28/2021  Medication Sig Note   acetaminophen (TYLENOL) 650 MG CR tablet Take 1,300 mg by mouth daily as needed for pain (arthritis).     COVID-19 mRNA Vac-TriS, Pfizer, SUSP injection Inject into the muscle.    diclofenac Sodium (VOLTAREN) 1 % GEL Apply 2 g topically daily as needed (pain).    diltiazem (CARDIZEM) 60 MG tablet Take 1 tablet (60 mg total) by mouth daily.    furosemide (LASIX) 20 MG tablet TAKE 2 TABLETS BY MOUTH EVERY DAY, MAY TAKE AN ADDITIONAL TABLET AS NEEDED FOR SWELLING. 04/22/2021: Reports taking 40 mg before breakfast and 20 mg with lunch for a total  of 60 mg daily   Glycerin-Hypromellose-PEG 400 ([employer] DRY EYE RELIEF) 0.2-0.2-1 % SOLN Place 1 drop into both eyes daily as needed (Dry eye).    haloperidol (HALDOL) 1 MG tablet TAKE 1 TABLET(1 MG) BY MOUTH EVERY 8 HOURS AS NEEDED FOR AGITATION    levothyroxine (SYNTHROID) 75 MCG tablet Take 1 tablet (75 mcg total) by mouth daily before breakfast.    NONFORMULARY OR COMPOUNDED ITEM rollator walker  #1  As directed -------  Dx  CHF,  DOE,  OA    nystatin (MYCOSTATIN/NYSTOP) powder Apply 1 application topically 3 (three) times daily.    potassium chloride SA (KLOR-CON) 20 MEQ tablet Take 1 tablet (20 mEq total) by mouth daily.    sertraline (ZOLOFT) 50 MG tablet TAKE 1 TABLET(50 MG) BY MOUTH DAILY    spironolactone (ALDACTONE) 25 MG tablet Take 1 tablet (25 mg total) by mouth every other day.    sulfamethoxazole-trimethoprim (BACTRIM) 400-80 MG tablet Take 1 tablet by mouth 2 (two) times daily.    XARELTO 15 MG TABS tablet TAKE 1 TABLET(15 MG) BY MOUTH DAILY WITH BREAKFAST. DISCONTINUE ELIQUIS    No facility-administered encounter medications on file as of 05/28/2021.    Patient Active Problem List   Diagnosis Date Noted   Pressure ulcer of sacral region, stage 1 04/23/2021   Intertrigo 04/23/2021   Abnormal urine odor 04/23/2021   Left foot pain 02/10/2021   Weakness 02/10/2021   Urinary tract infection without hematuria 02/10/2021   Cellulitis 02/10/2021   Dementia (HCC) 02/10/2021   Gout 02/10/2021   SOB (shortness of breath) 02/05/2021  CKD (chronic kidney disease), stage III (HCC) 02/05/2021   Anxiety 12/24/2020   Impacted cerumen of right ear 12/24/2020   Acute on chronic diastolic CHF (congestive heart failure) (HCC) 11/26/2020   Bilateral leg edema 01/30/2020   COVID-19 virus infection 11/21/2019   Cough 11/21/2019   External hemorrhoid, bleeding 10/12/2019   AF (paroxysmal atrial fibrillation) (HCC) 10/02/2019   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 (HCC) s/p  IR R MCA M2 09/30/2019   Middle cerebral artery embolism, right 09/30/2019   Contusion of foot including toes, right, initial encounter 08/10/2018   Primary osteoarthritis of right knee 07/31/2018   Right leg swelling 07/17/2018   Pain of right calf 07/17/2018   Preventative health care 09/18/2017   Low back pain radiating to left leg 11/28/2016   Hyperlipidemia 02/02/2015   Sciatica 07/16/2014   Knee pain, acute 07/16/2014   Lower extremity pain, right 07/16/2014   Skin infection 07/16/2014   Incontinence of urine in female 06/19/2013   Cerebral aneurysm rupture (HCC) 04/08/2013   LEG EDEMA, RIGHT 07/06/2010   OTHER DRUG ALLERGY 07/06/2010   DIARRHEA 02/12/2010   HEMATURIA, HX OF 11/24/2009   ANEURYSM, HX OF 08/03/2009   B12 deficiency 07/24/2009   ACQUIRED HEMOLYTIC ANEMIA UNSPECIFIED 07/24/2009   SYNCOPE 07/20/2009   DIZZINESS 07/20/2009   Memory loss 07/20/2009   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  Level of Care Concerns, ADL/IADL Limitations, Limited Access to Caregiver, Cognitive Deficits, Memory Deficits, and Lacks Knowledge of [employer].  Care Plan : LCSW Plan of Care.  Updates made by Karolee Stamps, LCSW since 05/28/2021 12:00 AM     Problem: Maintain My Quality of Life through Assisted Living Placement.   Priority: High     Goal: Maintain My Quality of Life through In-Home Care Services [facility].   Start Date: 04/29/2021  Expected End Date: 06/30/2021  This Visit's Progress: On track  Recent Progress: On track  Priority: High  Note:   Current Barriers:   Patient with CHF, A-Fib, HTN, Anxiety, Dementia, Stage III Chronic Kidney Disease, History of  Stroke, Osteoarthritis of Right Knee and Caregiver Stress needs Support, Education, and Care Coordination to resolve unmet personal care needs in the home. Patient is unable to self-administer medications as prescribed. Patient is unable to consistently perform ADL's/IADL's independently. Clinical Goals:  Over the next 30 to 45 days, patient will have in-home care services in place, either through Juventino Slovak, Child psychotherapist II with the [employer] of [facility] and [employer], or through a private agency of choice. Patient and daughter will decide whether or not they wish to pursue in-home care services for patient or place patient into a long-term memory care assisted living facility. Patient and daughter will work with LCSW and Juventino Slovak, Social Worker II with the [employer] [facility] and [employer], to coordinate care for in-home aide services. Patient and daughter will receive, review and consider arranging in-home care services through a private agency of choice, from the list emailed to them by LCSW, and notify LCSW if they need assistance with the referral process.   Patient and daughter will receive, review and consider arranging long-term memory  care assisted living placement for patient, from the list emailed to them by LCSW, and notify LCSW if they need assistance with the referral process. Collaboration with patient's Primary Care Physician, Dr. Loreen Freud to request completion of an FL-2 Form for placement purposes. Patient will attend all scheduled medical appointments as evidenced by patient report and care team review of appointment completion in electronic medical record. Patient will demonstrate improved health management independence as evidenced by having in-home care services in place. Clinical Interventions: Patient and daughter interviewed and appropriate assessments performed. Collaboration  with patient and daughter regarding development and update of comprehensive plan of care as evidenced by provider attestation and co-signature. Inter-disciplinary care team collaboration (see longitudinal plan of care). Interventions performed:  Problem Solving/Task Centered, Psychoeducation/Health Education, Quality of Sleep Assessed and Sleep Hygiene Techniques Promoted, Caregiver Stress Acknowledged and Consideration of In-Home Care Services Encouraged and Memory Care Assisted Living Placement Encouraged. Referral placed to Juventino Slovak, Social Worker II with the [facility] Department of Health and [employer] In-Home Aide Program. Explained to patient and daughter that applications for the [facility] are screened by a Child psychotherapist, through the [employer] [facility] and [employer], over the phone, and that client's are assessed at 3 different levels to determine what their needs are, whether home management or personal care services.   LCSW further explained to patient and daughter, that based on the level they are assessed, they can receive between 4-10 hours of in-home care services per week and that the number of hours determines the number of days they will receive services during that week.  Individuals can be disabled or aged, but cannot receive Adult Medicaid, Veteran Aide and Attendance Benefits, or any other services that would qualify them where in-home aide services are available.   Discussed plans with patient and daughter for ongoing care management follow-up and provided direct contact information for care management team. Assisted patient and daughter with obtaining information about health plan benefits through [facility]. Provided education to patient and daughter regarding level of care options. Assessed needs, level of care concerns, basic eligibility and provided education on [address] process, Private In-Home  Aide process and long-term memory care assisted living placement process. LCSW collaboration with Juventino Slovak, Social Worker II with the [facility] Department of Health and [employer], to verify application is received and processed. Identified resources and durable medical equipment needed in the home to improve safety and promote independence. LCSW collaboration with Primary Care Physician, Dr. Loreen Freud to request completion of FL-2 Form. LCSW will obtain Chest X-Ray results and submit to facility chosen for long-term memory care assisted living placement. LCSW collaboration with Primary Care Physician, Dr. Loreen Freud to request initiation of a COVID-19 Screening Test within 72 hours of admission into a long-term memory care assisted living facility. LCSW collaboration with Primary Care Physician, Dr. Loreen Freud to request prescription for Valtrex for shingles flare-up. Patient Goals/Self-Care Activities:  Daughter will continue to follow-up with Juventino Slovak, Social Worker II with the [facility] Department of Health and [employer], to check the status of your application process. Daughter will continue to call private agency sitters to try and arrange services, from the list of Home Health Care Agencies and In-Home Care and Respite Agencies, provided to her by LCSW.   Continue to work with LCSW on a weekly/bi-weekly basis until approved for in-home care services, either through  the [employer], or until in-home care services are in place through a private agency of choice. Follow-Up Plan: LCSW will follow-up with patient and daughter by telephone on 06/10/2021 at 3:30pm.       Follow-Up Plan:  06/10/2021 at 3:30pm      Danford Bad LCSW Licensed Clinical Social Worker [facility] [phone]

## 2021-05-31 ENCOUNTER — Ambulatory Visit: Payer: Medicare Other

## 2021-05-31 DIAGNOSIS — L89151 Pressure ulcer of sacral region, stage 1: Secondary | ICD-10-CM

## 2021-05-31 DIAGNOSIS — I509 Heart failure, unspecified: Secondary | ICD-10-CM

## 2021-05-31 DIAGNOSIS — I10 Essential (primary) hypertension: Secondary | ICD-10-CM

## 2021-05-31 NOTE — Chronic Care Management (AMB) (Signed)
Chronic Care Management   CCM RN Visit Note  05/31/2021 Name: Belinda Anderson MRN: [id number] DOB: [birth year]  Subjective: Belinda Anderson is a 87 y.o. year old female who is a primary care patient of Belinda Anderson, Belinda R, DO. The care management team was consulted for assistance with disease management and care coordination needs.    Engaged with patient by telephone for follow up visit in response to provider referral for case management and/or care coordination services.   Consent to Services:  The patient was given information about Chronic Care Management services, agreed to services, and gave verbal consent prior to initiation of services.  Please see initial visit note for detailed documentation.   Patient agreed to services and verbal consent obtained.   Assessment: Review of patient past medical history, allergies, medications, health status, including review of consultants reports, laboratory and other test data, was performed as part of comprehensive evaluation and provision of chronic care management services.   SDOH (Social Determinants of Health) assessments and interventions performed:    CCM Care Plan  No Known Allergies  Outpatient Encounter Medications as of 05/31/2021  Medication Sig Note   acetaminophen (TYLENOL) 650 MG CR tablet Take 1,300 mg by mouth daily as needed for pain (arthritis).     diclofenac Sodium (VOLTAREN) 1 % GEL Apply 2 g topically daily as needed (pain).    diltiazem (CARDIZEM) 60 MG tablet Take 1 tablet (60 mg total) by mouth daily.    furosemide (LASIX) 20 MG tablet TAKE 2 TABLETS BY MOUTH EVERY DAY, MAY TAKE AN ADDITIONAL TABLET AS NEEDED FOR SWELLING. 04/22/2021: Reports taking 40 mg before breakfast and 20 mg with lunch for a total of 60 mg daily   haloperidol (HALDOL) 1 MG tablet TAKE 1 TABLET(1 MG) BY MOUTH EVERY 8 HOURS AS NEEDED FOR AGITATION    levothyroxine (SYNTHROID) 75 MCG tablet Take 1 tablet (75 mcg total) by mouth daily before  breakfast.    nystatin (MYCOSTATIN/NYSTOP) powder Apply 1 application topically 3 (three) times daily.    potassium chloride SA (KLOR-CON) 20 MEQ tablet Take 1 tablet (20 mEq total) by mouth daily.    sertraline (ZOLOFT) 50 MG tablet TAKE 1 TABLET(50 MG) BY MOUTH DAILY    spironolactone (ALDACTONE) 25 MG tablet Take 1 tablet (25 mg total) by mouth every other day.    XARELTO 15 MG TABS tablet TAKE 1 TABLET(15 MG) BY MOUTH DAILY WITH BREAKFAST. DISCONTINUE ELIQUIS    COVID-19 mRNA Vac-TriS, Pfizer, SUSP injection Inject into the muscle.    Glycerin-Hypromellose-PEG 400 ([employer] DRY EYE RELIEF) 0.2-0.2-1 % SOLN Place 1 drop into both eyes daily as needed (Dry eye).    NONFORMULARY OR COMPOUNDED ITEM rollator walker  #1  As directed -------  Dx  CHF,  DOE,  OA    sulfamethoxazole-trimethoprim (BACTRIM) 400-80 MG tablet Take 1 tablet by mouth 2 (two) times daily.    No facility-administered encounter medications on file as of 05/31/2021.    Patient Active Problem List   Diagnosis Date Noted   Pressure ulcer of sacral region, stage 1 04/23/2021   Intertrigo 04/23/2021   Abnormal urine odor 04/23/2021   Left foot pain 02/10/2021   Weakness 02/10/2021   Urinary tract infection without hematuria 02/10/2021   Cellulitis 02/10/2021   Dementia (HCC) 02/10/2021   Gout 02/10/2021   SOB (shortness of breath) 02/05/2021   CKD (chronic kidney disease), stage III (HCC) 02/05/2021   Anxiety 12/24/2020   Impacted cerumen of right ear 12/24/2020  Acute on chronic diastolic CHF (congestive heart failure) (HCC) 11/26/2020   Bilateral leg edema 01/30/2020   COVID-19 virus infection 11/21/2019   Cough 11/21/2019   External hemorrhoid, bleeding 10/12/2019   AF (paroxysmal atrial fibrillation) (HCC) 10/02/2019   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 (HCC) s/p IR Anderson MCA M2 09/30/2019   Middle cerebral artery embolism, right 09/30/2019   Contusion of foot including toes, right, initial encounter  08/10/2018   Primary osteoarthritis of right knee 07/31/2018   Right leg swelling 07/17/2018   Pain of right calf 07/17/2018   Preventative health care 09/18/2017   Low back pain radiating to left leg 11/28/2016   Hyperlipidemia 02/02/2015   Sciatica 07/16/2014   Knee pain, acute 07/16/2014   Lower extremity pain, right 07/16/2014   Skin infection 07/16/2014   Incontinence of urine in female 06/19/2013   Cerebral aneurysm rupture (HCC) 04/08/2013   LEG EDEMA, RIGHT 07/06/2010   OTHER DRUG ALLERGY 07/06/2010   DIARRHEA 02/12/2010   HEMATURIA, HX OF 11/24/2009   ANEURYSM, HX OF 08/03/2009   B12 deficiency 07/24/2009   ACQUIRED HEMOLYTIC ANEMIA UNSPECIFIED 07/24/2009   SYNCOPE 07/20/2009   DIZZINESS 07/20/2009   Memory loss 07/20/2009   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   Hypothyroidism 12/14/2007   Essential hypertension 12/14/2007   DIVERTICULITIS, HX OF 12/14/2007   GOITER 01/30/2007   HYPERTENSION, BENIGN 01/30/2007   UTERINE POLYP 01/30/2007   THYROIDECTOMY, HX OF 01/30/2007    Conditions to be addressed/monitored:CHF, CAD, HTN, HLD, DMII, Anxiety, Dementia, and impaired skin integrity  Care Plan : Cardiovascular Disease  Updates made by Belinda Maryland, RN since 06/01/2021 12:00 AM     Problem: Symptom managment of cardivascular disease   Priority: Medium     Long-Range Goal: Symptom Exacerbation Prevented or Minimized   Start Date: 01/07/2021  Expected End Date: 09/24/2021  Recent Progress: On track  Priority: Medium  Note:   Current Barriers:  Knowledge deficit related to long term care plan management of cardiovascular disease. Admission 02/04/21-02/08/21 with acute on chronic CHF, A-fib, HTN, UTI, CKD stage III, dementia. RNCM spoke with Belinda Anderson, daughter/DPR. Client continues to live with daughter, Belinda Anderson. And has around the clock supervision. Ms. Belinda Anderson states that new in-home care providers have been contracted for in home care. And that  home health nurse is coming weekly to address skin integrity issues (under left breast and bottom). She reports area treated with a "powder" and are improving. Ms. Belinda Anderson states that patient blood pressures are better since decreasing diltiazem to 60 mg daily, but she states that Ms. Lemons blood pressure continues to range systolic 80's-120's and diastolic 40's-70's(actual blood pressure values not in front of Ms. Vinnefors). She denies swelling or edema. She reports client has SOB all the time, but no report of increased SOB.  Level of care: Mrs. [name] reports she has looked at some higher level of care facilities, but at this time would like to continue to care for client in the home with new in-home care agency in place. Case Manager Clinical Goal(s):  Patient/caregiver will verbalize understanding of management of cardiovascular disease and when to call doctor Patient will take medications as prescribed. Patient/caregiver will have appropriate community resources/support Interventions:  Collaboration with [name], Grayling Congress, DO regarding development and update of comprehensive plan of care as evidenced by provider attestation and co-signature Inter-disciplinary care team collaboration (see longitudinal plan of care) Discussed signs/symptoms of heart failure exacerbation. Medications reviewed Discussed blood pressure  and importance of maintaining acceptable blood pressure  range. RNCM encouraged recording readings with the time taken and when medications are given. Discussed importance of healthy eating and importance of maintaining certain amount of fluid intake. Based on recommendation of cardiologist. Provided education regarding low blood pressure Patient Goals: - Know when to call the doctor: increased weight gain more than 3 pounds overnight or 5 pounds in a week, increased swelling in stomach, hands, feet or legs, increased shortness of breath, if it is harder for you to breathe  when lying down and need to sit up to breath or you feel that something is not right. New or worsening dizziness, racing or irregular heartbeat that may be uncomfortable - call provider for questions or concerns. - continue to eat healthy: low salt, whole grains, fruits and vegetables, lean meats and healthy fats. May eat smaller portions (4-6 times a day) if needed to get nutrients in.  - continue to attend provider appointments as recommended - continue to take medications as prescribed. Call your provider with questions or concerns, - maintain fluid intake per provider recommendation.  - continue to work with Care Coordination team  - continue to be as active as possible - review education on low blood pressure. Plan to discuss at next telephone call. - review calendar/organizer. You can use this tool to record blood pressures, weights, blood sugars, etc. if this works for you. Please call your nurse care coordinator if you have any questions. Follow Up Plan: Telephone follow up appointment with care management team member scheduled for: 07/01/21 The patient has been provided with contact information for the care management team and has been advised to call with any health related questions or concerns.       Care Plan : Anxiety/Quality of Life  Updates made by Belinda Maryland, RN since 06/01/2021 12:00 AM     Problem: Quality of life affected by anxiety in patient with Chronic condition: Heart failure, HTN, atrial fibrillation,memory loss   Priority: High     Long-Range Goal: Quality of Life Maintained   Start Date: 04/22/2021  Expected End Date: 09/22/2021  This Visit's Progress: On track  Priority: High  Note:   Current Barriers: Long term care plan for management of Quality of Life in a patient with chronic CHF, A-fib, HTN, UTI, CKD stage III, dementia, anxiety. RNCM spoke with Belinda Anderson, daughter/DPR. Client continues to live with daughter, Belinda Anderson and has around the clock  supervision. [name] states that new in-home care providers have been contracted for in home care. And that home health nurse is coming weekly to address skin integrity issues (under left breast and bottom). She reports areas are healing. Reports patient spends more time in the bed now, but continues to get up periodically during the day and requires assistance to the bathroom.   Level of care: Mrs. [name] reports she has looked at some higher level of care facilities, but at this time would like to continue to care for client in the home with new in-home care agency in place.   Ineffective Self Health Maintenance  Clinical Goal(s):  Collaboration with [name], Grayling Congress, DO regarding development and update of comprehensive plan of care as evidenced by provider attestation and co-signature Inter-disciplinary care team collaboration (see longitudinal plan of care) Patient/caregiver will work with care management team to address care coordination and chronic disease management needs related to Disease Management, Educational Needs, Care Coordination, Dementia and Caregiver Support, Level of Care Concerns  Interventions:  1:1 collaboration with [name], Grayling Congress, DO regarding development and update of comprehensive plan of care as evidenced by provider attestation and co-signature Inter-disciplinary care team collaboration (see longitudinal plan of care) Evaluation of current treatment plan related to disease processes and patient's adherence to plan as established by provider. and patient's adherence to plan as established by provider. Re-iterated the importance of incontinence care: keeping skin clean and dry, use of protective/barrier cream.  Re-discussed palliative care with daughter/caregiver/DPR who continues to be in agreement with referral to palliative, if doctor is in agreement.  Collaboration with primary care provider re: daughter's interest in palliative care  services Discussed plans with daughter/caregiver/DPR for ongoing care management follow up and provided patient with direct contact information for care management team Patient Goals/Self-Care Activities Continue to avoid staying in one position too long. Change position at least every two hours Use a skin sealant or moisture barrier cream, try to keep skin as dry as possible Continue to monitor skin for improvement or any new or worsening areas Continue good nutrition: eat foods high in calories and protein, stay hydrated,  Continue to take medications as prescribed Call your provider office for new concerns or questions Continue to attend all scheduled provider appointments Continue to provide meaningful experiences  Continue to maintain Fall prevention strategies: keep walkways clear, limit or take out throw rugs (could easily become trip hazards), keep good lighting in the home, keep a flashlight near your bed, arrange solid pieces of furniture so that there may be a place to rest throughout the house if needed; change positions slowly. Grab bars in shower and/or next to toilet are useful. Avoid walking around barefoot and  don't walk around in slippers, stocking or socks. Instead wear low-heeled, comfortable shoes with rubber soles. Contact provider if you have fallen, even if there was no injury. Discuss interest in palliative care services with your provider Continue to work with Chronic care management team for ongoing case management.  Call your [facility] Care Coordinator as needed Continue to be as active as possible Follow Up Plan: Telephone follow up appointment with care management team member scheduled for: 07/01/21 The patient has been provided with contact information for the care management team and has been advised to call with any health related questions or concerns.    Plan:The patient has been provided with contact information for the care management team and has been advised to  call with any health related questions or concerns.  and The care management team will reach out to the patient again over the next 45 days.  Kathyrn Sheriff, RN, MSN, BSN, CCM Care Management Coordinator [facility] [phone]

## 2021-06-01 ENCOUNTER — Telehealth: Payer: Self-pay | Admitting: Student

## 2021-06-01 ENCOUNTER — Other Ambulatory Visit: Payer: Self-pay | Admitting: Family Medicine

## 2021-06-01 DIAGNOSIS — I4891 Unspecified atrial fibrillation: Secondary | ICD-10-CM

## 2021-06-01 DIAGNOSIS — I509 Heart failure, unspecified: Secondary | ICD-10-CM

## 2021-06-01 DIAGNOSIS — F0280 Dementia in other diseases classified elsewhere without behavioral disturbance: Secondary | ICD-10-CM

## 2021-06-01 NOTE — Patient Instructions (Signed)
Visit Information  PATIENT GOALS:  Goals Addressed             This Visit's Progress    Quality of Life Maintained/Improved   On track    Timeframe:  Long-Range Goal Priority:  High Start Date:   01/07/21                          Expected End Date:   09/21/21                    Follow up date: 07/01/21  Patient Goals: Continue to avoid staying in one position too long. Change position at least every two hours Use a skin sealant or moisture barrier cream, try to keep skin as dry as possible Continue to monitor skin for improvement or any new or worsening areas Continue good nutrition: eat foods high in calories and protein, stay hydrated,  Continue to take medications as prescribed Call your provider office for new concerns or questions Continue to attend all scheduled provider appointments Continue to provide meaningful experiences  Continue to maintain Fall prevention strategies: keep walkways clear, limit or take out throw rugs (could easily become trip hazards), keep good lighting in the home, keep a flashlight near your bed, arrange solid pieces of furniture so that there may be a place to rest throughout the house if needed; change positions slowly. Grab bars in shower and/or next to toilet are useful. Avoid walking around barefoot and  don't walk around in slippers, stocking or socks. Instead wear low-heeled, comfortable shoes with rubber soles. Contact provider if you have fallen, even if there was no injury. Discuss interest in palliative care services with your provider Continue to work with Chronic care management team for ongoing case management.  Call your [facility] Care Coordinator as needed Continue to be as active as possible     Track and Manage cardiac signs/symptoms   On track    Timeframe:  Long-Range Goal Priority:  Medium Start Date:  01/07/21                         Expected End Date:  09/21/21                     Follow Up Date 07/01/21    Patient Goals: - Know  when to call the doctor: increased weight gain more than 3 pounds overnight or 5 pounds in a week, increased swelling in stomach, hands, feet or legs, increased shortness of breath, if it is harder for you to breathe when lying down and need to sit up to breath or you feel that something is not right. New or worsening dizziness, racing or irregular heartbeat that may be uncomfortable - call provider for questions or concerns. - continue to eat healthy: low salt, whole grains, fruits and vegetables, lean meats and healthy fats. May eat smaller portions (4-6 times a day) if needed to get nutrients in.  - continue to attend provider appointments as recommended - continue to take medications as prescribed. Call your provider with questions or concerns, - maintain fluid intake per provider recommendation.  - continue to work with Care Coordination team  - continue to be as active as possible - review education on low blood pressure. Plan to discuss at next telephone call. -review calendar/organizer. You can use this tool to record blood pressures, weights, blood sugars, etc. if this works  for you. Please call your nurse care coordinator if you have any questions.   Why is this important?   You will be able to handle your symptoms better if you keep track of them.  Making some simple changes to your lifestyle will help.  Eating healthy is one thing you can do to take good care of yourself.        Hypotension As your heart beats, it forces blood through your body. This force is called blood pressure. If you have hypotension, you have low blood pressure. When your blood pressure is too low, you may not get enough blood to your brain or other parts of your body. This may cause you to feel weak, light-headed, have a fast heartbeat, or even pass out (faint). Low blood pressure may be harmless, or it may cause serious problems. What are the causes? Blood loss. Not enough water in the body  (dehydration). Heart problems. Hormone problems. Pregnancy. A very bad infection. Not having enough of certain nutrients. Very bad allergic reactions. Certain medicines. What increases the risk? Age. The risk increases as you get older. Conditions that affect the heart or the brain and spinal cord (central nervous system). Taking certain medicines. Being pregnant. What are the signs or symptoms? Feeling: Weak. Light-headed. Dizzy. Tired (fatigued). Blurred vision. Fast heartbeat. Passing out, in very bad cases. How is this treated? Changing your diet. This may involve eating more salt (sodium) or drinking more water. Taking medicines to raise your blood pressure. Changing how much you take (the dosage) of some of your medicines. Wearing compression stockings. These stockings help to prevent blood clots and reduce swelling in your legs. In some cases, you may need to go to the hospital for: Fluid replacement. This means you will receive fluids through an IV tube. Blood replacement. This means you will receive donated blood through an IV tube (transfusion). Treating an infection or heart problems, if this applies. Monitoring. You may need to be monitored while medicines that you are taking wear off. Follow these instructions at home: Eating and drinking  Drink enough fluids to keep your pee (urine) pale yellow. Eat a healthy diet. Follow instructions from your doctor about what you can eat or drink. A healthy diet includes: Fresh fruits and vegetables. Whole grains. Low-fat (lean) meats. Low-fat dairy products. Eat extra salt only as told. Do not add extra salt to your diet unless your doctor tells you to. Eat small meals often. Avoid standing up quickly after you eat.  Medicines Take over-the-counter and prescription medicines only as told by your doctor. Follow instructions from your doctor about changing how much you take of your medicines, if this applies. Do not  stop or change any of your medicines on your own. General instructions  Wear compression stockings as told by your doctor(if your doctor has recommended this). Get up slowly from lying down or sitting. Avoid hot showers and a lot of heat as told by your doctor. Return to your normal activities as told by your doctor. Ask what activities are safe for you. Do not use any products that contain nicotine or tobacco, such as cigarettes, e-cigarettes, and chewing tobacco. If you need help quitting, ask your doctor. Keep all follow-up visits as told by your doctor. This is important.  Contact a doctor if: You throw up (vomit). You have watery poop (diarrhea). You have a fever for more than 2-3 days. You feel more thirsty than normal. You feel weak and tired. Get help  right away if: You have chest pain. You have a fast or uneven heartbeat. You lose feeling (have numbness) in any part of your body. You cannot move your arms or your legs. You have trouble talking. You get sweaty or feel light-headed. You pass out. You have trouble breathing. You have trouble staying awake. You feel mixed up (confused). Summary Hypotension is also called low blood pressure. It is when the force of blood pumping through your arteries is too weak. Hypotension may be harmless, or it may cause serious problems. Treatment may include changing your diet and medicines, and wearing compression stockings. In very bad cases, you may need to go to the hospital. This information is not intended to replace advice given to you by your health care provider. Make sure you discuss any questions you have with your healthcare provider. Document Revised: 03/29/2018 Document Reviewed: 03/29/2018 Elsevier Patient Education  2022 Elsevier Inc.   Patient verbalizes understanding of instructions provided today and agrees to view in [place].   The patient has been provided with contact information for the care management team and  has been advised to call with any health related questions or concerns.  The care management team will reach out to the patient again over the next 45 days.   Kathyrn Sheriff, RN, MSN, BSN, CCM Care Management Coordinator [facility] [phone]

## 2021-06-01 NOTE — Telephone Encounter (Signed)
Rec'd return call from patient's daughter, [name], and after discussing the Palliative referral/services she was in agreement with scheduling visit.  I have scheduled an In-home Consult for 06/16/21 @ 11:30 AM.

## 2021-06-01 NOTE — Telephone Encounter (Signed)
Attempted to contact patient's daughter [name] to schedule Palliative Consult, no answer - left message with reason for call along with my name and call back number.

## 2021-06-07 ENCOUNTER — Other Ambulatory Visit (INDEPENDENT_AMBULATORY_CARE_PROVIDER_SITE_OTHER): Payer: Medicare Other

## 2021-06-07 ENCOUNTER — Other Ambulatory Visit: Payer: Self-pay

## 2021-06-07 DIAGNOSIS — R8290 Unspecified abnormal findings in urine: Secondary | ICD-10-CM

## 2021-06-07 DIAGNOSIS — R5383 Other fatigue: Secondary | ICD-10-CM | POA: Diagnosis not present

## 2021-06-07 DIAGNOSIS — R300 Dysuria: Secondary | ICD-10-CM

## 2021-06-07 LAB — POC URINALSYSI DIPSTICK (AUTOMATED)
Bilirubin, UA: NEGATIVE
Glucose, UA: NEGATIVE
Ketones, UA: NEGATIVE
Nitrite, UA: POSITIVE
Protein, UA: NEGATIVE
Spec Grav, UA: 1.015 (ref 1.010–1.025)
Urobilinogen, UA: 0.2 E.U./dL
pH, UA: 7 (ref 5.0–8.0)

## 2021-06-07 NOTE — Progress Notes (Signed)
Patient daughter states patient is having burning with urination and abnormal urine odor.

## 2021-06-09 LAB — URINE CULTURE
MICRO NUMBER:: 12273241
SPECIMEN QUALITY:: ADEQUATE

## 2021-06-10 ENCOUNTER — Telehealth: Payer: Medicare Other

## 2021-06-10 ENCOUNTER — Ambulatory Visit: Payer: Medicare Other | Admitting: *Deleted

## 2021-06-10 DIAGNOSIS — R42 Dizziness and giddiness: Secondary | ICD-10-CM

## 2021-06-10 DIAGNOSIS — R0602 Shortness of breath: Secondary | ICD-10-CM

## 2021-06-10 DIAGNOSIS — R531 Weakness: Secondary | ICD-10-CM

## 2021-06-10 DIAGNOSIS — M1711 Unilateral primary osteoarthritis, right knee: Secondary | ICD-10-CM

## 2021-06-10 DIAGNOSIS — R413 Other amnesia: Secondary | ICD-10-CM

## 2021-06-10 DIAGNOSIS — I509 Heart failure, unspecified: Secondary | ICD-10-CM

## 2021-06-10 DIAGNOSIS — F0280 Dementia in other diseases classified elsewhere without behavioral disturbance: Secondary | ICD-10-CM

## 2021-06-10 DIAGNOSIS — I10 Essential (primary) hypertension: Secondary | ICD-10-CM

## 2021-06-10 NOTE — Patient Instructions (Signed)
Visit Information  PATIENT GOALS:  Goals Addressed             This Visit's Progress    Maintain My Quality of Life through In-Home Care Services Versus Assisted Living Placement.   On track    Timeframe:  Short-Term Goal Priority:  High Start Date:  04/29/2021                         Expected End Date:   06/30/2021               Follow-Up Date: 06/17/2021 at 3:30pm  Patient Goals/Self-Care Activities:  Begin receiving in-home care services through [facility], to receive 24 hour care and supervision, as well as assist with ADL's/IADL's. Begin receiving Palliative Care Services through [facility] Nurse, Merilynn Finland with [facility], beginning on 06/16/2021 at 11:30am.         Patient verbalizes understanding of instructions provided today and agrees to view in MyChart.   Telephone follow-up appointment with care management team member scheduled for:  06/17/2021 at 3:30pm  Danford Bad LCSW Licensed Clinical Social Worker [facility] [phone]

## 2021-06-10 NOTE — Chronic Care Management (AMB) (Signed)
Chronic Care Management    Clinical Social Work Note  06/10/2021 Name: Belinda Anderson MRN: [id number] DOB: 07/09/1932  Belinda Anderson is a 87 y.o. year old female who is a primary care patient of [name], DO. The CCM team was consulted to assist the patient with chronic disease management and/or care coordination needs related to: Level of Care Concerns.   Engaged with patient's daughter by telephone for follow-up visit in response to provider referral for social work chronic care management and care coordination services.   Consent to Services:  The patient was given information about Chronic Care Management services, agreed to services, and gave verbal consent prior to initiation of services.  Please see initial visit note for detailed documentation.   Patient agreed to services and consent obtained.   Assessment: Review of patient past medical history, allergies, medications, and health status, including review of relevant consultants reports was performed today as part of a comprehensive evaluation and provision of chronic care management and care coordination services.     SDOH (Social Determinants of Health) assessments and interventions performed:    Advanced Directives Status: Not addressed in this encounter.  CCM Care Plan  No Known Allergies  Outpatient Encounter Medications as of 06/10/2021  Medication Sig Note   acetaminophen (TYLENOL) 650 MG CR tablet Take 1,300 mg by mouth daily as needed for pain (arthritis).     COVID-19 mRNA Vac-TriS, Pfizer, SUSP injection Inject into the muscle.    diclofenac Sodium (VOLTAREN) 1 % GEL Apply 2 g topically daily as needed (pain).    diltiazem (CARDIZEM) 60 MG tablet Take 1 tablet (60 mg total) by mouth daily.    furosemide (LASIX) 20 MG tablet TAKE 2 TABLETS BY MOUTH EVERY DAY, MAY TAKE AN ADDITIONAL TABLET AS NEEDED FOR SWELLING. 04/22/2021: Reports taking 40 mg before breakfast and 20 mg with lunch for a total of 60 mg  daily   Glycerin-Hypromellose-PEG 400 ([employer] DRY EYE RELIEF) 0.2-0.2-1 % SOLN Place 1 drop into both eyes daily as needed (Dry eye).    haloperidol (HALDOL) 1 MG tablet TAKE 1 TABLET(1 MG) BY MOUTH EVERY 8 HOURS AS NEEDED FOR AGITATION    levothyroxine (SYNTHROID) 75 MCG tablet Take 1 tablet (75 mcg total) by mouth daily before breakfast.    NONFORMULARY OR COMPOUNDED ITEM rollator walker  #1  As directed -------  Dx  CHF,  DOE,  OA    nystatin (MYCOSTATIN/NYSTOP) powder Apply 1 application topically 3 (three) times daily.    potassium chloride SA (KLOR-CON) 20 MEQ tablet Take 1 tablet (20 mEq total) by mouth daily.    sertraline (ZOLOFT) 50 MG tablet TAKE 1 TABLET(50 MG) BY MOUTH DAILY    spironolactone (ALDACTONE) 25 MG tablet Take 1 tablet (25 mg total) by mouth every other day.    sulfamethoxazole-trimethoprim (BACTRIM) 400-80 MG tablet Take 1 tablet by mouth 2 (two) times daily.    XARELTO 15 MG TABS tablet TAKE 1 TABLET(15 MG) BY MOUTH DAILY WITH BREAKFAST. DISCONTINUE ELIQUIS    No facility-administered encounter medications on file as of 06/10/2021.    Patient Active Problem List   Diagnosis Date Noted   Pressure ulcer of sacral region, stage 1 04/23/2021   Intertrigo 04/23/2021   Abnormal urine odor 04/23/2021   Left foot pain 02/10/2021   Weakness 02/10/2021   Urinary tract infection without hematuria 02/10/2021   Cellulitis 02/10/2021   Dementia (HCC) 02/10/2021   Gout 02/10/2021   SOB (shortness of breath) 02/05/2021  CKD (chronic kidney disease), stage III (HCC) 02/05/2021   Anxiety 12/24/2020   Impacted cerumen of right ear 12/24/2020   Acute on chronic diastolic CHF (congestive heart failure) (HCC) 11/26/2020   Bilateral leg edema 01/30/2020   COVID-19 virus infection 11/21/2019   Cough 11/21/2019   External hemorrhoid, bleeding 10/12/2019   AF (paroxysmal atrial fibrillation) (HCC) 10/02/2019   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 (HCC) s/p IR R MCA  M2 09/30/2019   Middle cerebral artery embolism, right 09/30/2019   Contusion of foot including toes, right, initial encounter 08/10/2018   Primary osteoarthritis of right knee 07/31/2018   Right leg swelling 07/17/2018   Pain of right calf 07/17/2018   Preventative health care 09/18/2017   Low back pain radiating to left leg 11/28/2016   Hyperlipidemia 02/02/2015   Sciatica 07/16/2014   Knee pain, acute 07/16/2014   Lower extremity pain, right 07/16/2014   Skin infection 07/16/2014   Incontinence of urine in female 06/19/2013   Cerebral aneurysm rupture (HCC) 04/08/2013   LEG EDEMA, RIGHT 07/06/2010   OTHER DRUG ALLERGY 07/06/2010   DIARRHEA 02/12/2010   HEMATURIA, HX OF 11/24/2009   ANEURYSM, HX OF 08/03/2009   B12 deficiency 07/24/2009   ACQUIRED HEMOLYTIC ANEMIA UNSPECIFIED 07/24/2009   SYNCOPE 07/20/2009   DIZZINESS 07/20/2009   Memory loss 07/20/2009   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  Level of Care Concerns, ADL/IADL Limitations, Limited Access to Caregiver, Cognitive Deficits, Memory Deficits, and Lacks Knowledge of [employer].  Care Plan : LCSW Plan of Care.  Updates made by Karolee Stamps, LCSW since 06/10/2021 12:00 AM     Problem: Maintain My Quality of Life through Assisted Living Placement.   Priority: High     Goal: Maintain My Quality of Life through In-Home Care Services [facility].   Start Date: 04/29/2021  Expected End Date: 06/30/2021  This Visit's Progress: On track  Recent Progress: On track  Priority: High  Note:   Current Barriers:   Patient with CHF, A-Fib, HTN, Anxiety, Dementia, Stage III Chronic Kidney Disease, History of  Stroke, Osteoarthritis of Right Knee and Caregiver Stress needs Support, Education, and Care Coordination to resolve unmet personal care needs in the home. Patient is unable to self-administer medications as prescribed or consistently perform ADL's/IADL's independently. Clinical Goals:  Patient will have in-home care services in place to assist with ADL's/IADL's and provide 24 hour care and supervision.   Collaboration with patient's Primary Care Physician, Dr. Loreen Freud to request completion of an FL-2 Form for placement purposes. Patient will demonstrate improved health management independence as evidenced by having in-home care services in place. Clinical Interventions. Inter-disciplinary care team collaboration (see longitudinal plan of care). Interventions performed:  Problem Solving/Task Centered, Psychoeducation/Health Education, Quality of Sleep Assessed and Sleep Hygiene Techniques Promoted, Caregiver Stress Acknowledged and Consideration of In-Home Care Services Encouraged and Memory Care Assisted Living Placement Encouraged. Assisted patient and daughter with obtaining information about health plan benefits through [facility]. Provided education to patient and daughter regarding level of care options. Assessed needs, level of care concerns, basic eligibility and provided education on [address] process, Private In-Home Aide process and long-term memory care assisted living  placement process. Patient Goals/Self-Care Activities:  Begin receiving in-home care services through [facility], to receive 24 hour care and supervision, as well as assist with ADL's/IADL's. Begin receiving Palliative Care Services through Home Health Nurse, Merilynn Finland with [facility], beginning on 06/16/2021 at 11:30am. Follow-Up Plan: LCSW will follow-up with patient and daughter by telephone on 06/17/2021 at 3:30pm.       Follow-Up Plan:  06/17/2021 at  3:30pm      Danford Bad LCSW Licensed Clinical Social Worker [facility] [phone]

## 2021-06-16 DIAGNOSIS — F0280 Dementia in other diseases classified elsewhere without behavioral disturbance: Secondary | ICD-10-CM

## 2021-06-16 DIAGNOSIS — G309 Alzheimer's disease, unspecified: Secondary | ICD-10-CM

## 2021-06-16 DIAGNOSIS — I509 Heart failure, unspecified: Secondary | ICD-10-CM | POA: Diagnosis not present

## 2021-06-16 DIAGNOSIS — I10 Essential (primary) hypertension: Secondary | ICD-10-CM

## 2021-06-16 DIAGNOSIS — F0281 Dementia in other diseases classified elsewhere with behavioral disturbance: Secondary | ICD-10-CM

## 2021-06-16 DIAGNOSIS — M1711 Unilateral primary osteoarthritis, right knee: Secondary | ICD-10-CM

## 2021-06-17 ENCOUNTER — Ambulatory Visit (INDEPENDENT_AMBULATORY_CARE_PROVIDER_SITE_OTHER): Payer: Medicare Other | Admitting: *Deleted

## 2021-06-17 DIAGNOSIS — I509 Heart failure, unspecified: Secondary | ICD-10-CM

## 2021-06-17 DIAGNOSIS — F419 Anxiety disorder, unspecified: Secondary | ICD-10-CM

## 2021-06-17 DIAGNOSIS — F0280 Dementia in other diseases classified elsewhere without behavioral disturbance: Secondary | ICD-10-CM

## 2021-06-17 DIAGNOSIS — R42 Dizziness and giddiness: Secondary | ICD-10-CM

## 2021-06-17 DIAGNOSIS — M5431 Sciatica, right side: Secondary | ICD-10-CM

## 2021-06-17 DIAGNOSIS — I607 Nontraumatic subarachnoid hemorrhage from unspecified intracranial artery: Secondary | ICD-10-CM

## 2021-06-17 DIAGNOSIS — R413 Other amnesia: Secondary | ICD-10-CM

## 2021-06-17 DIAGNOSIS — M1711 Unilateral primary osteoarthritis, right knee: Secondary | ICD-10-CM

## 2021-06-17 DIAGNOSIS — R0602 Shortness of breath: Secondary | ICD-10-CM

## 2021-06-17 NOTE — Patient Instructions (Signed)
Visit Information  PATIENT GOALS:  Goals Addressed             This Visit's Progress    COMPLETED: Maintain My Quality of Life through In-Home Care Services [facility].   On track    Timeframe:  Short-Term Goal Priority:  High Start Date:  04/29/2021                         Expected End Date:   06/17/2021               Follow-Up Date:  No Follow-Up Required, Per Daughter  Patient Goals/Self-Care Activities:  Continue to receive in-home care services through [facility], to provide 24 hour care and supervision, as well as assist with ADL's/IADL's. Continue to receive Palliative Care Services through Home Health Nurse, Merilynn Finland with [employer]. Contact LCSW directly (# [phone]) if you need assistance, or if additional social work needs are identified in the near future.         Patient verbalizes understanding of instructions provided today and agrees to view in MyChart.   No Follow-Up Required, Per Daughter  Danford Bad LCSW Licensed Clinical Social Worker [facility] [phone]

## 2021-06-17 NOTE — Chronic Care Management (AMB) (Signed)
Chronic Care Management    Clinical Social Work Note  06/17/2021 Name: Belinda Anderson MRN: [id number] DOB: Feb 22, 1932  Belinda Anderson is a 87 y.o. year old female who is a primary care patient of [name], DO. The CCM team was consulted to assist the patient with chronic disease management and/or care coordination needs related to: Level of Care Concerns.   Engaged with patient's daughter by telephone for follow-up visit in response to provider referral for social work chronic care management and care coordination services.   Consent to Services:  The patient was given information about Chronic Care Management services, agreed to services, and gave verbal consent prior to initiation of services.  Please see initial visit note for detailed documentation.   Patient agreed to services and consent obtained.   Assessment: Review of patient past medical history, allergies, medications, and health status, including review of relevant consultants reports was performed today as part of a comprehensive evaluation and provision of chronic care management and care coordination services.     SDOH (Social Determinants of Health) assessments and interventions performed:    Advanced Directives Status: Not addressed in this encounter.  CCM Care Plan  No Known Allergies  Outpatient Encounter Medications as of 06/17/2021  Medication Sig Note   acetaminophen (TYLENOL) 650 MG CR tablet Take 1,300 mg by mouth daily as needed for pain (arthritis).     COVID-19 mRNA Vac-TriS, Pfizer, SUSP injection Inject into the muscle.    diclofenac Sodium (VOLTAREN) 1 % GEL Apply 2 g topically daily as needed (pain).    diltiazem (CARDIZEM) 60 MG tablet Take 1 tablet (60 mg total) by mouth daily.    furosemide (LASIX) 20 MG tablet TAKE 2 TABLETS BY MOUTH EVERY DAY, MAY TAKE AN ADDITIONAL TABLET AS NEEDED FOR SWELLING. 04/22/2021: Reports taking 40 mg before breakfast and 20 mg with lunch for a total of 60 mg  daily   Glycerin-Hypromellose-PEG 400 ([employer] DRY EYE RELIEF) 0.2-0.2-1 % SOLN Place 1 drop into both eyes daily as needed (Dry eye).    haloperidol (HALDOL) 1 MG tablet TAKE 1 TABLET(1 MG) BY MOUTH EVERY 8 HOURS AS NEEDED FOR AGITATION    levothyroxine (SYNTHROID) 75 MCG tablet Take 1 tablet (75 mcg total) by mouth daily before breakfast.    NONFORMULARY OR COMPOUNDED ITEM rollator walker  #1  As directed -------  Dx  CHF,  DOE,  OA    nystatin (MYCOSTATIN/NYSTOP) powder Apply 1 application topically 3 (three) times daily.    potassium chloride SA (KLOR-CON) 20 MEQ tablet Take 1 tablet (20 mEq total) by mouth daily.    sertraline (ZOLOFT) 50 MG tablet TAKE 1 TABLET(50 MG) BY MOUTH DAILY    spironolactone (ALDACTONE) 25 MG tablet Take 1 tablet (25 mg total) by mouth every other day.    sulfamethoxazole-trimethoprim (BACTRIM) 400-80 MG tablet Take 1 tablet by mouth 2 (two) times daily.    XARELTO 15 MG TABS tablet TAKE 1 TABLET(15 MG) BY MOUTH DAILY WITH BREAKFAST. DISCONTINUE ELIQUIS    No facility-administered encounter medications on file as of 06/17/2021.    Patient Active Problem List   Diagnosis Date Noted   Pressure ulcer of sacral region, stage 1 04/23/2021   Intertrigo 04/23/2021   Abnormal urine odor 04/23/2021   Left foot pain 02/10/2021   Weakness 02/10/2021   Urinary tract infection without hematuria 02/10/2021   Cellulitis 02/10/2021   Dementia (HCC) 02/10/2021   Gout 02/10/2021   SOB (shortness of breath) 02/05/2021  CKD (chronic kidney disease), stage III (HCC) 02/05/2021   Anxiety 12/24/2020   Impacted cerumen of right ear 12/24/2020   Acute on chronic diastolic CHF (congestive heart failure) (HCC) 11/26/2020   Bilateral leg edema 01/30/2020   COVID-19 virus infection 11/21/2019   Cough 11/21/2019   External hemorrhoid, bleeding 10/12/2019   AF (paroxysmal atrial fibrillation) (HCC) 10/02/2019   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 (HCC) s/p IR R MCA  M2 09/30/2019   Middle cerebral artery embolism, right 09/30/2019   Contusion of foot including toes, right, initial encounter 08/10/2018   Primary osteoarthritis of right knee 07/31/2018   Right leg swelling 07/17/2018   Pain of right calf 07/17/2018   Preventative health care 09/18/2017   Low back pain radiating to left leg 11/28/2016   Hyperlipidemia 02/02/2015   Sciatica 07/16/2014   Knee pain, acute 07/16/2014   Lower extremity pain, right 07/16/2014   Skin infection 07/16/2014   Incontinence of urine in female 06/19/2013   Cerebral aneurysm rupture (HCC) 04/08/2013   LEG EDEMA, RIGHT 07/06/2010   OTHER DRUG ALLERGY 07/06/2010   DIARRHEA 02/12/2010   HEMATURIA, HX OF 11/24/2009   ANEURYSM, HX OF 08/03/2009   B12 deficiency 07/24/2009   ACQUIRED HEMOLYTIC ANEMIA UNSPECIFIED 07/24/2009   SYNCOPE 07/20/2009   DIZZINESS 07/20/2009   Memory loss 07/20/2009   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  Level of Care Concerns, ADL/IADL Limitations, Limited Access to Caregiver, Cognitive Deficits, Memory Deficits, and Lacks Knowledge of [employer].  Care Plan : LCSW Plan of Care.  Updates made by Karolee Stamps, LCSW since 06/17/2021 12:00 AM     Problem: Maintain My Quality of Life through Assisted Living Placement. Resolved 06/17/2021  Priority: High     Goal: Maintain My Quality of Life through In-Home Care Services [facility]. Completed 06/17/2021  Start Date: 04/29/2021  Expected End Date: 06/17/2021  This Visit's Progress: On track  Recent Progress: On track  Priority: High  Note:   Current Barriers:   Patient with CHF, A-Fib, HTN, Anxiety, Dementia, Stage III  Chronic Kidney Disease, History of Stroke, Osteoarthritis of Right Knee and Caregiver Stress needs Support, Education, and Care Coordination to resolve unmet personal care needs in the home. Patient is unable to self-administer medications as prescribed or consistently perform ADL's/IADL's independently. Clinical Goals:  Patient will have in-home care services in place to assist with ADL's/IADL's and provide 24 hour care and supervision.   Collaboration with patient's Primary Care Physician, Dr. Loreen Freud to request completion of an FL-2 Form for placement purposes. Patient will demonstrate improved health management independence as evidenced by having in-home care services in place. Clinical Interventions. Inter-disciplinary care team collaboration (see longitudinal plan of care). Interventions performed:  Problem Solving/Task Centered, Psychoeducation/Health Education, Quality of Sleep Assessed and Sleep Hygiene Techniques Promoted, Caregiver Stress Acknowledged and Consideration of In-Home Care Services Encouraged and Memory Care Assisted Living Placement Encouraged. Assisted patient and daughter with obtaining information about health plan benefits through [facility]. Provided education to patient and daughter regarding level of care options. Assessed needs, level of care concerns, basic eligibility and provided education on [address] process, Private In-Home Aide process and long-term memory care  assisted living placement process. Patient Goals/Self-Care Activities:  Continue to receive in-home care services through [facility], to provide 24 hour care and supervision, as well as assist with ADL's/IADL's. Continue to receive Palliative Care Services through Home Health Nurse, Merilynn Finland with [employer]. Contact LCSW directly (# [phone]) if you need assistance, or if additional social work needs are identified in the near  future. Follow-Up Plan:  No Follow-Up Required, Per Daughter.       Follow-Up Plan:  No Follow-Up Required, Per Daughter      Danford Bad LCSW Licensed Clinical Social Worker [facility] Med [facility] [phone]

## 2021-07-01 ENCOUNTER — Ambulatory Visit: Payer: Medicare Other

## 2021-07-01 DIAGNOSIS — I10 Essential (primary) hypertension: Secondary | ICD-10-CM

## 2021-07-01 DIAGNOSIS — I509 Heart failure, unspecified: Secondary | ICD-10-CM

## 2021-07-01 DIAGNOSIS — F0280 Dementia in other diseases classified elsewhere without behavioral disturbance: Secondary | ICD-10-CM

## 2021-07-01 DIAGNOSIS — G309 Alzheimer's disease, unspecified: Secondary | ICD-10-CM

## 2021-07-01 NOTE — Chronic Care Management (AMB) (Signed)
Chronic Care Management   CCM RN Visit Note  07/01/2021 Name: Belinda Anderson MRN: [id number] DOB: 1932-05-26  Subjective: Belinda Anderson is a 87 y.o. year old female who is a primary care patient of Belinda Schultz, DO. The care management team was consulted for assistance with disease management and care coordination needs.    Engaged with patient by telephone for follow up visit in response to provider referral for case management and/or care coordination services.   Consent to Services:  The patient was given information about Chronic Care Management services, agreed to services, and gave verbal consent prior to initiation of services.  Please see initial visit note for detailed documentation.   Patient agreed to services and verbal consent obtained.   Assessment: Review of patient past medical history, allergies, medications, health status, including review of consultants reports, laboratory and other test data, was performed as part of comprehensive evaluation and provision of chronic care management services.   SDOH (Social Determinants of Health) assessments and interventions performed:    CCM Care Plan  No Known Allergies  Outpatient Encounter Medications as of 07/01/2021  Medication Sig Note   acetaminophen (TYLENOL) 650 MG CR tablet Take 1,300 mg by mouth daily as needed for pain (arthritis).     COVID-19 mRNA Vac-TriS, Pfizer, SUSP injection Inject into the muscle.    diclofenac Sodium (VOLTAREN) 1 % GEL Apply 2 g topically daily as needed (pain).    diltiazem (CARDIZEM) 60 MG tablet Take 1 tablet (60 mg total) by mouth daily.    furosemide (LASIX) 20 MG tablet TAKE 2 TABLETS BY MOUTH EVERY DAY, MAY TAKE AN ADDITIONAL TABLET AS NEEDED FOR SWELLING. 04/22/2021: Reports taking 40 mg before breakfast and 20 mg with lunch for a total of 60 mg daily   Glycerin-Hypromellose-PEG 400 ([employer] DRY EYE RELIEF) 0.2-0.2-1 % SOLN Place 1 drop into both eyes daily as needed (Dry eye).     haloperidol (HALDOL) 1 MG tablet TAKE 1 TABLET(1 MG) BY MOUTH EVERY 8 HOURS AS NEEDED FOR AGITATION    levothyroxine (SYNTHROID) 75 MCG tablet Take 1 tablet (75 mcg total) by mouth daily before breakfast.    NONFORMULARY OR COMPOUNDED ITEM rollator walker  #1  As directed -------  Dx  CHF,  DOE,  OA    nystatin (MYCOSTATIN/NYSTOP) powder Apply 1 application topically 3 (three) times daily.    potassium chloride SA (KLOR-CON) 20 MEQ tablet Take 1 tablet (20 mEq total) by mouth daily.    sertraline (ZOLOFT) 50 MG tablet TAKE 1 TABLET(50 MG) BY MOUTH DAILY    spironolactone (ALDACTONE) 25 MG tablet Take 1 tablet (25 mg total) by mouth every other day.    sulfamethoxazole-trimethoprim (BACTRIM) 400-80 MG tablet Take 1 tablet by mouth 2 (two) times daily.    XARELTO 15 MG TABS tablet TAKE 1 TABLET(15 MG) BY MOUTH DAILY WITH BREAKFAST. DISCONTINUE ELIQUIS    No facility-administered encounter medications on file as of 07/01/2021.    Patient Active Problem List   Diagnosis Date Noted   Pressure ulcer of sacral region, stage 1 04/23/2021   Intertrigo 04/23/2021   Abnormal urine odor 04/23/2021   Left foot pain 02/10/2021   Weakness 02/10/2021   Urinary tract infection without hematuria 02/10/2021   Cellulitis 02/10/2021   Dementia (HCC) 02/10/2021   Gout 02/10/2021   SOB (shortness of breath) 02/05/2021   CKD (chronic kidney disease), stage III (HCC) 02/05/2021   Anxiety 12/24/2020   Impacted cerumen of right ear 12/24/2020  Acute on chronic diastolic CHF (congestive heart failure) (HCC) 11/26/2020   Bilateral leg edema 01/30/2020   COVID-19 virus infection 11/21/2019   Cough 11/21/2019   External hemorrhoid, bleeding 10/12/2019   AF (paroxysmal atrial fibrillation) (HCC) 10/02/2019   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 09/30/2019   Middle cerebral artery embolism, right 09/30/2019   Contusion of foot including toes, right, initial encounter  08/10/2018   Primary osteoarthritis of right knee 07/31/2018   Right leg swelling 07/17/2018   Pain of right calf 07/17/2018   Preventative health care 09/18/2017   Low back pain radiating to left leg 11/28/2016   Hyperlipidemia 02/02/2015   Sciatica 07/16/2014   Knee pain, acute 07/16/2014   Lower extremity pain, right 07/16/2014   Skin infection 07/16/2014   Incontinence of urine in female 06/19/2013   Cerebral aneurysm rupture (HCC) 04/08/2013   LEG EDEMA, RIGHT 07/06/2010   OTHER DRUG ALLERGY 07/06/2010   DIARRHEA 02/12/2010   HEMATURIA, HX OF 11/24/2009   ANEURYSM, HX OF 08/03/2009   B12 deficiency 07/24/2009   ACQUIRED HEMOLYTIC ANEMIA UNSPECIFIED 07/24/2009   SYNCOPE 07/20/2009   DIZZINESS 07/20/2009   Memory loss 07/20/2009   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   Hypothyroidism 12/14/2007   Essential hypertension 12/14/2007   DIVERTICULITIS, HX OF 12/14/2007   GOITER 01/30/2007   HYPERTENSION, BENIGN 01/30/2007   UTERINE POLYP 01/30/2007   THYROIDECTOMY, HX OF 01/30/2007    Conditions to be addressed/monitored:Atrial Fibrillation, CHF, HTN, HLD, Anxiety, and Dementia  Care Plan : Anxiety/Quality of Life  Updates made by Belinda Maryland, RN since 07/02/2021 12:00 AM     Problem: Quality of life affected by anxiety in patient with Chronic condition: Heart failure, HTN, atrial fibrillation,memory loss   Priority: High     Long-Range Goal: Quality of Life Maintained   Start Date: 04/22/2021  Expected End Date: 09/22/2021  This Visit's Progress: On track  Recent Progress: On track  Priority: High  Note:   Current Barriers: Long term care plan for management of Quality of Life in a patient with chronic CHF, A-fib, HTN, UTI, CKD stage III, dementia, anxiety. RNCM spoke with Belinda Anderson, (daughter/DPR). Patient continues to have in-home care giver service, however today Belinda Anderson is taking care of patient, because home health aid is not available. Belinda.  Abbe Anderson reports positive feedback regarding implementation of palliative care service. And states that patient's wounds are healed. Belinda Anderson states patient's toenails need to be cut adding they are beginning to turn.    Ineffective Self Health Maintenance  Clinical Goal(s):  Collaboration with [name], Grayling Congress, DO regarding development and update of comprehensive plan of care as evidenced by provider attestation and co-signature Inter-disciplinary care team collaboration (see longitudinal plan of care) Patient/caregiver will work with care management team to address care coordination and chronic disease management needs related to Disease Management, Educational Needs, Care Coordination, Dementia and Caregiver Support, Level of Care Concerns   Interventions:  1:1 collaboration with [name], Grayling Congress, DO regarding development and update of comprehensive plan of care as evidenced by provider attestation and co-signature Inter-disciplinary care team collaboration (see longitudinal plan of care) Evaluation of current treatment plan related to disease processes and patient's adherence to plan as established by provider. and patient's adherence to plan as established by provider. Re-iterated the importance of good skin care.   Medications reviewed. RNCM will attempt to locate resources for foot care management. Discussed upcoming  appointments Palliative services discussed-daughter reports positive feedback regarding the implementation of palliative care services. Discussed plans with daughter/caregiver/DPR for ongoing care management follow up and provided patient with direct contact information for care management team Patient Goals/Self-Care Activities Continue to change position at least every two hours Continue to provide good skin care and monitor skin for any new or worsening areas Continue good nutrition: eat foods high in calories and protein, stay hydrated,  Continue to take  medications as prescribed Continue to attend all scheduled provider appointments Continue to provide meaningful experiences  Continue to maintain Fall prevention strategies: keep walkways clear, limit or take out throw rugs (could easily become trip hazards), keep good lighting in the home, keep a flashlight near your bed, arrange solid pieces of furniture so that there may be a place to rest throughout the house if needed; change positions slowly. Grab bars in shower and/or next to toilet are useful. Avoid walking around barefoot and  don't walk around in slippers, stocking or socks. Instead wear low-heeled, comfortable shoes with rubber soles. Contact provider if you have fallen, even if there was no injury. Continue to be as active as possible Call your provider office for new concerns or questions Continue to work with Chronic care management team for ongoing case management and care coordination needs.  Follow Up Plan: Telephone follow up appointment with care management team member scheduled for: 08/03/21 The patient has been provided with contact information for the care management team and has been advised to call with any health related questions or concerns.    Plan:The patient has been provided with contact information for the care management team and has been advised to call with any health related questions or concerns.  and The care management team will reach out to the patient again next month.  Kathyrn Sheriff, RN, MSN, BSN, CCM Care Management Coordinator [facility] [phone]

## 2021-07-01 NOTE — Patient Instructions (Signed)
Visit Information  PATIENT GOALS:  Goals Addressed             This Visit's Progress    Quality of Life Maintained/Improved       Timeframe:  Long-Range Goal Priority:  High Start Date:   01/07/21                          Expected End Date:   09/21/21                    Follow up date: 08/03/21  Patient Goals/Self-Care Activities Continue to change position at least every two hours Continue to provide good skin care and monitor skin for any new or worsening areas Continue good nutrition: eat foods high in calories and protein, stay hydrated,  Continue to take medications as prescribed Continue to attend all scheduled provider appointments Continue to provide meaningful experiences  Continue to maintain Fall prevention strategies: keep walkways clear, limit or take out throw rugs (could easily become trip hazards), keep good lighting in the home, keep a flashlight near your bed, arrange solid pieces of furniture so that there may be a place to rest throughout the house if needed; change positions slowly. Grab bars in shower and/or next to toilet are useful. Avoid walking around barefoot and  don't walk around in slippers, stocking or socks. Instead wear low-heeled, comfortable shoes with rubber soles. Contact provider if you have fallen, even if there was no injury. Continue to be as active as possible Call your provider office for new concerns or questions Continue to work with Chronic care management team for ongoing case management and care coordination needs.      Track and Manage cardiac signs/symptoms   On track    Timeframe:  Long-Range Goal Priority:  Medium Start Date:  01/07/21                         Expected End Date:  09/21/21                     Follow Up Date 08/03/21    Patient Goals: - Know when to call the doctor: increased weight gain more than 3 pounds overnight or 5 pounds in a week, increased swelling in stomach, hands, feet or legs, increased shortness of  breath, if it is harder for you to breathe when lying down and need to sit up to breath or you feel that something is not right. New or worsening dizziness, racing or irregular heartbeat that may be uncomfortable - continue to eat healthy: low salt, whole grains, fruits and vegetables, lean meats and healthy fats. May eat smaller portions (4-6 times a day) if needed to get nutrients in.  - continue to attend provider appointments as recommended - continue to take medications as prescribed.  - maintain fluid intake per provider recommendation.  - continue to be as active as possible - continue to work with palliative care service. - call your provider for any new or worsening symptoms.         Patient verbalizes understanding of instructions provided today and agrees to view in MyChart.   Telephone follow up appointment with care management team member scheduled for: The patient/caregiver has been provided with contact information for the care management team and has been advised to call with any health related questions or concerns.   Kathyrn Sheriff, RN, MSN, BSN, CCM  Care Management Coordinator [facility] [phone]

## 2021-07-02 ENCOUNTER — Ambulatory Visit: Payer: Medicare Other

## 2021-07-02 DIAGNOSIS — I10 Essential (primary) hypertension: Secondary | ICD-10-CM

## 2021-07-02 DIAGNOSIS — F0280 Dementia in other diseases classified elsewhere without behavioral disturbance: Secondary | ICD-10-CM

## 2021-07-02 DIAGNOSIS — G309 Alzheimer's disease, unspecified: Secondary | ICD-10-CM

## 2021-07-02 NOTE — Chronic Care Management (AMB) (Signed)
Chronic Care Management   CCM RN Visit Note  07/02/2021 Name: Belinda Anderson MRN: [id number] DOB: 13-Jun-1932  Subjective: Belinda Anderson is a 87 y.o. year old female who is a primary care patient of [name], DO. The care management team was consulted for assistance with disease management and care coordination needs.    Engaged with patient by telephone for follow up visit in response to provider referral for case management and/or care coordination services.   Consent to Services:  The patient was given information about Chronic Care Management services, agreed to services, and gave verbal consent prior to initiation of services.  Please see initial visit note for detailed documentation.   Patient agreed to services and verbal consent obtained.   Assessment: Review of patient past medical history, allergies, medications, health status, including review of consultants reports, laboratory and other test data, was performed as part of comprehensive evaluation and provision of chronic care management services.   SDOH (Social Determinants of Health) assessments and interventions performed:    CCM Care Plan  No Known Allergies  Outpatient Encounter Medications as of 07/02/2021  Medication Sig Note   acetaminophen (TYLENOL) 650 MG CR tablet Take 1,300 mg by mouth daily as needed for pain (arthritis).     COVID-19 mRNA Vac-TriS, Pfizer, SUSP injection Inject into the muscle.    diclofenac Sodium (VOLTAREN) 1 % GEL Apply 2 g topically daily as needed (pain).    diltiazem (CARDIZEM) 60 MG tablet Take 1 tablet (60 mg total) by mouth daily.    furosemide (LASIX) 20 MG tablet TAKE 2 TABLETS BY MOUTH EVERY DAY, MAY TAKE AN ADDITIONAL TABLET AS NEEDED FOR SWELLING. 04/22/2021: Reports taking 40 mg before breakfast and 20 mg with lunch for a total of 60 mg daily   Glycerin-Hypromellose-PEG 400 ([employer] DRY EYE RELIEF) 0.2-0.2-1 % SOLN Place 1 drop into both eyes daily as needed (Dry eye).     haloperidol (HALDOL) 1 MG tablet TAKE 1 TABLET(1 MG) BY MOUTH EVERY 8 HOURS AS NEEDED FOR AGITATION    levothyroxine (SYNTHROID) 75 MCG tablet Take 1 tablet (75 mcg total) by mouth daily before breakfast.    NONFORMULARY OR COMPOUNDED ITEM rollator walker  #1  As directed -------  Dx  CHF,  DOE,  OA    nystatin (MYCOSTATIN/NYSTOP) powder Apply 1 application topically 3 (three) times daily.    potassium chloride SA (KLOR-CON) 20 MEQ tablet Take 1 tablet (20 mEq total) by mouth daily.    sertraline (ZOLOFT) 50 MG tablet TAKE 1 TABLET(50 MG) BY MOUTH DAILY    spironolactone (ALDACTONE) 25 MG tablet Take 1 tablet (25 mg total) by mouth every other day.    sulfamethoxazole-trimethoprim (BACTRIM) 400-80 MG tablet Take 1 tablet by mouth 2 (two) times daily.    XARELTO 15 MG TABS tablet TAKE 1 TABLET(15 MG) BY MOUTH DAILY WITH BREAKFAST. DISCONTINUE ELIQUIS    No facility-administered encounter medications on file as of 07/02/2021.    Patient Active Problem List   Diagnosis Date Noted   Pressure ulcer of sacral region, stage 1 04/23/2021   Intertrigo 04/23/2021   Abnormal urine odor 04/23/2021   Left foot pain 02/10/2021   Weakness 02/10/2021   Urinary tract infection without hematuria 02/10/2021   Cellulitis 02/10/2021   Dementia (HCC) 02/10/2021   Gout 02/10/2021   SOB (shortness of breath) 02/05/2021   CKD (chronic kidney disease), stage III (HCC) 02/05/2021   Anxiety 12/24/2020   Impacted cerumen of right ear 12/24/2020  Acute on chronic diastolic CHF (congestive heart failure) (HCC) 11/26/2020   Bilateral leg edema 01/30/2020   COVID-19 virus infection 11/21/2019   Cough 11/21/2019   External hemorrhoid, bleeding 10/12/2019   AF (paroxysmal atrial fibrillation) (HCC) 10/02/2019   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 09/30/2019   Middle cerebral artery embolism, right 09/30/2019   Contusion of foot including toes, right, initial encounter  08/10/2018   Primary osteoarthritis of right knee 07/31/2018   Right leg swelling 07/17/2018   Pain of right calf 07/17/2018   Preventative health care 09/18/2017   Low back pain radiating to left leg 11/28/2016   Hyperlipidemia 02/02/2015   Sciatica 07/16/2014   Knee pain, acute 07/16/2014   Lower extremity pain, right 07/16/2014   Skin infection 07/16/2014   Incontinence of urine in female 06/19/2013   Cerebral aneurysm rupture (HCC) 04/08/2013   LEG EDEMA, RIGHT 07/06/2010   OTHER DRUG ALLERGY 07/06/2010   DIARRHEA 02/12/2010   HEMATURIA, HX OF 11/24/2009   ANEURYSM, HX OF 08/03/2009   B12 deficiency 07/24/2009   ACQUIRED HEMOLYTIC ANEMIA UNSPECIFIED 07/24/2009   SYNCOPE 07/20/2009   DIZZINESS 07/20/2009   Memory loss 07/20/2009   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   Hypothyroidism 12/14/2007   Essential hypertension 12/14/2007   DIVERTICULITIS, HX OF 12/14/2007   GOITER 01/30/2007   HYPERTENSION, BENIGN 01/30/2007   UTERINE POLYP 01/30/2007   THYROIDECTOMY, HX OF 01/30/2007    Conditions to be addressed/monitored:HTN, HLD, and Dementia  Care Plan : Anxiety/Quality of Life  Updates made by Colletta Maryland, RN since 07/02/2021 12:00 AM     Problem: Quality of life affected by anxiety in patient with Chronic condition: Heart failure, HTN, atrial fibrillation,memory loss   Priority: High     Long-Range Goal: Quality of Life Maintained   Start Date: 04/22/2021  Expected End Date: 09/22/2021  Recent Progress: On track  Priority: High  Note:   Current Barriers: Long term care plan for management of Quality of Life in a patient with chronic CHF, A-fib, HTN, UTI, CKD stage III, dementia, anxiety. RNCM called to discuss in home foot care management with [name]. She reports caregiver service not available today, no consistent care giver service. She states she has had to change agencies 3 times. She would like to go ahead with plans for placement in a memory  care. Her choice is [facility]. She states she is going to call [facility] today.  Ineffective Self Health Maintenance  Clinical Goal(s):  Collaboration with [name], Grayling Congress, DO regarding development and update of comprehensive plan of care as evidenced by provider attestation and co-signature Inter-disciplinary care team collaboration (see longitudinal plan of care) Patient/caregiver will work with care management team to address care coordination and chronic disease management needs related to Disease Management, Educational Needs, Care Coordination, Dementia and Caregiver Support, Level of Care Concerns   Interventions:  1:1 collaboration with [name], Grayling Congress, DO regarding development and update of comprehensive plan of care as evidenced by provider attestation and co-signature Inter-disciplinary care team collaboration (see longitudinal plan of care) Evaluation of current treatment plan related to disease processes and patient's adherence to plan as established by provider. and patient's adherence to plan as established by provider. Re-iterated the importance of good skin care.   Medications reviewed. RNCM will attempt to locate resources for foot care management. Discussed upcoming appointments Palliative services discussed-daughter reports positive feedback regarding the implementation of palliative  care services. Discussed plans with daughter/caregiver/DPR for ongoing care management follow up and provided patient with direct contact information for care management team. Update 07/02/21: RNCM will re-consult clinical LCSW for assistance with placement and update PCP. Provided list of personal care service agencies. Patient Goals/Self-Care Activities Plan to receive a call from social worker to assist with placement. Continue to change position at least every two hours Continue to provide good skin care and monitor skin for any new or worsening areas Continue good  nutrition: eat foods high in calories and protein, stay hydrated,  Continue to take medications as prescribed Continue to attend all scheduled provider appointments Continue to provide meaningful experiences  Continue to maintain Fall prevention strategies: keep walkways clear, limit or take out throw rugs (could easily become trip hazards), keep good lighting in the home, keep a flashlight near your bed, arrange solid pieces of furniture so that there may be a place to rest throughout the house if needed; change positions slowly. Grab bars in shower and/or next to toilet are useful. Avoid walking around barefoot and  don't walk around in slippers, stocking or socks. Instead wear low-heeled, comfortable shoes with rubber soles. Contact provider if you have fallen, even if there was no injury. Continue to be as active as possible Call your provider office for new concerns or questions Continue to work with Chronic care management team for ongoing case management and care coordination needs.  Follow Up Plan: Telephone follow up appointment with care management team member scheduled for: 08/03/21 The patient has been provided with contact information for the care management team and has been advised to call with any health related questions or concerns.    Plan:Telephone follow up appointment with care management team member scheduled for:  next month and The patient has been provided with contact information for the care management team and has been advised to call with any health related questions or concerns.   Kathyrn Sheriff, RN, MSN, BSN, CCM Care Management Coordinator [facility] [phone]

## 2021-07-02 NOTE — Patient Instructions (Signed)
Visit Information  PATIENT GOALS:  Goals Addressed             This Visit's Progress    Quality of Life Maintained/Improved   On track    Timeframe:  Long-Range Goal Priority:  High Start Date:   01/07/21                          Expected End Date:   09/21/21                    Follow up date: 08/03/21  Patient Goals: Plan to receive a call from social worker to assist with placement. Continue to change position at least every two hours Continue to provide good skin care and monitor skin for any new or worsening areas Continue good nutrition: eat foods high in calories and protein, stay hydrated,  Continue to take medications as prescribed Continue to attend all scheduled provider appointments Continue to provide meaningful experiences  Continue to maintain Fall prevention strategies: keep walkways clear, limit or take out throw rugs (could easily become trip hazards), keep good lighting in the home, keep a flashlight near your bed, arrange solid pieces of furniture so that there may be a place to rest throughout the house if needed; change positions slowly. Grab bars in shower and/or next to toilet are useful. Avoid walking around barefoot and  don't walk around in slippers, stocking or socks. Instead wear low-heeled, comfortable shoes with rubber soles. Contact provider if you have fallen, even if there was no injury. Continue to be as active as possible Call your provider office for new concerns or questions Continue to work with Chronic care management team for ongoing case management and care coordination needs.         Patient verbalizes understanding of instructions provided today and agrees to view in MyChart.   Telephone follow up appointment with care management team member scheduled for: The patient has been provided with contact information for the care management team and has been advised to call with any health related questions or concerns.   Kathyrn Sheriff, RN,  MSN, BSN, CCM Care Management Coordinator [facility] [phone]

## 2021-07-05 ENCOUNTER — Telehealth: Payer: Self-pay | Admitting: *Deleted

## 2021-07-05 ENCOUNTER — Other Ambulatory Visit: Payer: Self-pay | Admitting: Family Medicine

## 2021-07-05 DIAGNOSIS — B351 Tinea unguium: Secondary | ICD-10-CM

## 2021-07-05 NOTE — Chronic Care Management (AMB) (Signed)
  Chronic Care Management   Note  07/05/2021 Name: Belinda Anderson MRN: [id number] DOB: 1931/10/25  Belinda Anderson is a 87 y.o. year old female who is a primary care patient of [name], Grayling Congress, DO. Belinda Anderson is currently enrolled in care management services. An additional referral for Licensed Clinical SW was placed.   Follow up plan: Telephone appointment with care management team member scheduled for: 07/08/2021  Burman Nieves, CCMA Care Guide, Embedded Care Coordination [facility]  Care Management  Direct Dial: [phone]

## 2021-07-08 ENCOUNTER — Ambulatory Visit: Payer: Medicare Other | Admitting: *Deleted

## 2021-07-08 DIAGNOSIS — I607 Nontraumatic subarachnoid hemorrhage from unspecified intracranial artery: Secondary | ICD-10-CM

## 2021-07-08 DIAGNOSIS — R413 Other amnesia: Secondary | ICD-10-CM

## 2021-07-08 DIAGNOSIS — R42 Dizziness and giddiness: Secondary | ICD-10-CM

## 2021-07-08 DIAGNOSIS — M1711 Unilateral primary osteoarthritis, right knee: Secondary | ICD-10-CM

## 2021-07-08 DIAGNOSIS — F0280 Dementia in other diseases classified elsewhere without behavioral disturbance: Secondary | ICD-10-CM

## 2021-07-08 NOTE — Chronic Care Management (AMB) (Signed)
Chronic Care Management    Clinical Social Work Note  07/08/2021 Name: Belinda Anderson MRN: [id number] DOB: Jul 02, 1932  Belinda Anderson is a 87 y.o. year old female who is a primary care patient of [name], DO. The CCM team was consulted to assist the patient with chronic disease management and/or care coordination needs related to: Level of Care Concerns and Caregiver Stress.   Engaged with patient's daughter by telephone for initial visit in response to provider referral for social work chronic care management and care coordination services.   Consent to Services:  The patient was given information about Chronic Care Management services, agreed to services, and gave verbal consent prior to initiation of services.  Please see initial visit note for detailed documentation.   Patient agreed to services and consent obtained.   Assessment: Review of patient past medical history, allergies, medications, and health status, including review of relevant consultants reports was performed today as part of a comprehensive evaluation and provision of chronic care management and care coordination services.     SDOH (Social Determinants of Health) assessments and interventions performed:    Advanced Directives Status: See Care Plan for related entries.  CCM Care Plan  No Known Allergies  Outpatient Encounter Medications as of 07/08/2021  Medication Sig Note   acetaminophen (TYLENOL) 650 MG CR tablet Take 1,300 mg by mouth daily as needed for pain (arthritis).     COVID-19 mRNA Vac-TriS, Pfizer, SUSP injection Inject into the muscle.    diclofenac Sodium (VOLTAREN) 1 % GEL Apply 2 g topically daily as needed (pain).    diltiazem (CARDIZEM) 60 MG tablet Take 1 tablet (60 mg total) by mouth daily.    furosemide (LASIX) 20 MG tablet TAKE 2 TABLETS BY MOUTH EVERY DAY, MAY TAKE AN ADDITIONAL TABLET AS NEEDED FOR SWELLING. 04/22/2021: Reports taking 40 mg before breakfast and 20 mg with lunch  for a total of 60 mg daily   Glycerin-Hypromellose-PEG 400 ([employer] DRY EYE RELIEF) 0.2-0.2-1 % SOLN Place 1 drop into both eyes daily as needed (Dry eye).    haloperidol (HALDOL) 1 MG tablet TAKE 1 TABLET(1 MG) BY MOUTH EVERY 8 HOURS AS NEEDED FOR AGITATION    levothyroxine (SYNTHROID) 75 MCG tablet Take 1 tablet (75 mcg total) by mouth daily before breakfast.    NONFORMULARY OR COMPOUNDED ITEM rollator walker  #1  As directed -------  Dx  CHF,  DOE,  OA    nystatin (MYCOSTATIN/NYSTOP) powder Apply 1 application topically 3 (three) times daily.    potassium chloride SA (KLOR-CON) 20 MEQ tablet Take 1 tablet (20 mEq total) by mouth daily.    sertraline (ZOLOFT) 50 MG tablet TAKE 1 TABLET(50 MG) BY MOUTH DAILY    spironolactone (ALDACTONE) 25 MG tablet Take 1 tablet (25 mg total) by mouth every other day.    sulfamethoxazole-trimethoprim (BACTRIM) 400-80 MG tablet Take 1 tablet by mouth 2 (two) times daily.    XARELTO 15 MG TABS tablet TAKE 1 TABLET(15 MG) BY MOUTH DAILY WITH BREAKFAST. DISCONTINUE ELIQUIS    No facility-administered encounter medications on file as of 07/08/2021.    Patient Active Problem List   Diagnosis Date Noted   Pressure ulcer of sacral region, stage 1 04/23/2021   Intertrigo 04/23/2021   Abnormal urine odor 04/23/2021   Left foot pain 02/10/2021   Weakness 02/10/2021   Urinary tract infection without hematuria 02/10/2021   Cellulitis 02/10/2021   Dementia (HCC) 02/10/2021   Gout 02/10/2021   SOB (shortness  of breath) 02/05/2021   CKD (chronic kidney disease), stage III (HCC) 02/05/2021   Anxiety 12/24/2020   Impacted cerumen of right ear 12/24/2020   Acute on chronic diastolic CHF (congestive heart failure) (HCC) 11/26/2020   Bilateral leg edema 01/30/2020   COVID-19 virus infection 11/21/2019   Cough 11/21/2019   External hemorrhoid, bleeding 10/12/2019   AF (paroxysmal atrial fibrillation) (HCC) 10/02/2019   Stroke due to embolism of right middle cerebral  artery (HCC) s/p IR R MCA M2 09/30/2019   Middle cerebral artery embolism, right 09/30/2019   Contusion of foot including toes, right, initial encounter 08/10/2018   Primary osteoarthritis of right knee 07/31/2018   Right leg swelling 07/17/2018   Pain of right calf 07/17/2018   Preventative health care 09/18/2017   Low back pain radiating to left leg 11/28/2016   Hyperlipidemia 02/02/2015   Sciatica 07/16/2014   Knee pain, acute 07/16/2014   Lower extremity pain, right 07/16/2014   Skin infection 07/16/2014   Incontinence of urine in female 06/19/2013   Cerebral aneurysm rupture (HCC) 04/08/2013   LEG EDEMA, RIGHT 07/06/2010   OTHER DRUG ALLERGY 07/06/2010   DIARRHEA 02/12/2010   HEMATURIA, HX OF 11/24/2009   ANEURYSM, HX OF 08/03/2009   B12 deficiency 07/24/2009   ACQUIRED HEMOLYTIC ANEMIA UNSPECIFIED 07/24/2009   SYNCOPE 07/20/2009   DIZZINESS 07/20/2009   Memory loss 07/20/2009   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  Limited Social Support, Level of Care Concerns, ADL/IADL Limitations, Mental Health Concerns, Social Isolation, Limited Access to Caregiver, Cognitive Deficits, Memory Deficits, and Lacks Knowledge of [employer].  Care Plan : LCSW Plan of Care  Updates made by Karolee Stamps, LCSW since 07/08/2021 12:00 AM     Problem: Maintain My Quality of Life through Placement into a Higher Level of Care.   Priority: High     Goal: Maintain My Quality of Life through Placement into a Higher Level of Care.   Start Date: 07/08/2021  Expected End Date: 09/07/2021  This Visit's Progress: On track  Recent Progress: On track  Priority: High  Note:   Current Barriers:   Patient with  Alzheimer's Dementia without Behavioral Disturbance, Unspecified Timing of Dementia Onset, Congestive Heart Failure, Unspecified HF Chronicity, Unspecified Heart Failure Type, Dizziness, Shortness of Breath, Memory Loss, Primary Osteoarthritis of Right Knee, Cerebral Aneurysm Rupture and Anxiety needs Support, Education, Referrals, Resources and Care Coordination to resolve unmet personal care needs in the home through placement into a higher level of care. Clinical Goal(s):  Patient's daughter will work with LCSW to obtain placement for patient into a long-term memory care assisted living facility.   LCSW Interventions:  Inter-disciplinary care team collaboration (see longitudinal plan of care). Collaboration with Primary Care Physician, Dr. Seabron Spates, regarding development and update of comprehensive plan of care as evidenced by provider attestation and co-signature. Collaboration with Primary Care Physician, Dr. Seabron Spates, to request completion of an FL-2 Form for placement purposes. Level of care options, assisted living facility placement process and insurance coverage discussed with patient's daughter, and completion of a Special Assistance Long-Term Care Medicaid application was encouraged.   Patient Goals/Self-Care Activities: Daughter will accept weekly/bi-weekly calls from LCSW, in an effort to coordinate long-term care placement for  you in a memory care assisted living facility.   Daughter agreed to tour all facilities of interest, which include [facility] in [place], [place] and [place]. Keep home assessment with Admissions Coordinator from [facility], scheduled for 12:00 noon on 07/13/2021. Daughter will contact LCSW directly (# [phone]) if she has questions, needs assistance, or if additional social work needs are identified between now and our next scheduled telephone outreach call.    Follow-Up Plan:  LCSW will make contact with patient's on 07/13/2021 at 2:45pm      Danford Bad LCSW Licensed Clinical Social Worker [facility] [phone]

## 2021-07-08 NOTE — Patient Instructions (Signed)
Visit Information   PATIENT GOALS:   Goals Addressed             This Visit's Progress    Maintain My Quality of Life through Placement into a Higher Level of Care.   On track    Timeframe:  Short-Term Goal Priority:  High Start Date:  07/08/2021                        Expected End Date:  09/07/2021              Follow-Up Date:  07/13/2021 at 2:45pm  Patient Goals/Self-Care Activities:  Continue to receive in-home care services through [facility], to provide 24 hour care and supervision, as well as assist with ADL's/IADL's. Continue to receive Palliative Care Services through [place], Kathi Ludwig with [employer]. Daughter will accept weekly/bi-weekly calls from LCSW, in an effort to coordinate long-term care placement for you in a memory care assisted living facility.   Daughter agreed to tour all facilities of interest, which include [facility] in [place], [place]. Keep home assessment with Admissions Coordinator from [facility], scheduled for 12:00 noon on 07/13/2021. Daughter will contact LCSW directly (# [phone]) if she has questions, needs assistance, or if additional social work needs are identified between now and our next scheduled telephone outreach call.            Consent to CCM Services: Ms. Blyden was given information about Chronic Care Management services including:  CCM service includes personalized support from designated clinical staff supervised by her physician, including individualized plan of care and coordination with other care providers 24/7 contact phone numbers for assistance for urgent and routine care needs. Service will only be billed when office clinical staff spend 20 minutes or more in a month to coordinate care. Only one practitioner may furnish and bill the service in a calendar month. The patient may stop CCM  services at any time (effective at the end of the month) by phone call to the office staff. The patient will be responsible for cost sharing (co-pay) of up to 20% of the service fee (after annual deductible is met).  Patient agreed to services and verbal consent obtained.   Patient verbalizes understanding of instructions provided today and agrees to view in [place].   Telephone follow up appointment with care management team member scheduled for:  07/13/2021 at 2:45pm  [place] Licensed Clinical Social Worker [facility] [phone]   CLINICAL CARE PLAN: Patient Care Plan: Cardiovascular Disease     Problem Identified: Symptom managment of cardivascular disease   Priority: Medium     Long-Range Goal: Symptom Exacerbation Prevented or Minimized   Start Date: 01/07/2021  Expected End Date: 09/24/2021  This Visit's Progress: On track  Recent Progress: On track  Priority: Medium  Note:   Current Barriers:  Knowledge deficit related to long term care plan management of cardiovascular disease. Admission 02/04/21-02/08/21 with acute on chronic CHF, A-fib, HTN, UTI, CKD stage III, dementia. RNCM spoke with Madelyn Brunner, daughter/DPR. Ms. Priscille Heidelberg reports client is unchanged. Denies any signs/symptoms of heart failure exacerbation. Denies edema. Reports blood pressure ranges 104/153/63-93. Continues to have in-home care giver who assist with monitoring patient's blood pressure and weight. She reports positive feedback regarding implementation of palliative care service. She reports wounds healed. Mrs. Priscille Heidelberg reports patient's appetite is good.  Case Manager Clinical Goal(s):  Patient/caregiver will verbalize understanding of management of cardiovascular disease and when to call doctor Patient will take medications as prescribed. Patient/caregiver will have appropriate community resources/support Interventions:  Collaboration with [name], [name], DO  regarding development and update of comprehensive plan of care as evidenced by provider attestation and co-signature Inter-disciplinary care team collaboration (see longitudinal plan of care) Reviewed medications with daughter.  Reviewed upcoming appointment with cardiologist: July 22, 2021.  Reviewed blood pressure readings with daughter and encouraged to take readings to cardiologist visit.  Discussed eating pattern and Encouraged healthy eating. Discussed calendar/organizer and the benefits of its use. Patient Goals: - Know when to call the doctor: increased weight gain more than 3 pounds overnight or 5 pounds in a week, increased swelling in stomach, hands, feet or legs, increased shortness of breath, if it is harder for you to breathe when lying down and need to sit up to breath or you feel that something is not right. New or worsening dizziness, racing or irregular heartbeat that may be uncomfortable - continue to eat healthy: low salt, whole grains, fruits and vegetables, lean meats and healthy fats. May eat smaller portions (4-6 times a day) if needed to get nutrients in.  - continue to attend provider appointments as recommended - continue to take medications as prescribed.  - maintain fluid intake per provider recommendation.  - continue to be as active as possible - continue to work with palliative care service. - call your provider for any new or worsening symptoms.  Follow Up Plan: Telephone follow up appointment with care management team member scheduled for: 08/03/21 The patient has been provided with contact information for the care management team and has been advised to call with any health related questions or concerns.       Patient Care Plan: Anxiety/Quality of Life     Problem Identified: Quality of life affected by anxiety in patient with Chronic condition: Heart failure, HTN, atrial fibrillation,memory loss   Priority: High     Long-Range Goal: Anxiety Symptoms  Monitored and Managed Completed 04/22/2021  Start Date: 01/07/2021  Expected End Date: 09/21/2021  Recent Progress: On track  Priority: High  Note:   Current Barriers:  Long term care plan for management of Quality of Life. RNCM spoke with DPR, daughter Madelyn Brunner, who reports continued support from siblings. She reports client's anxiety is currently managed. Nurse Case Manager Clinical Goal(s):  Patient/caregiver will verbalize understanding of plan for management of health, anxiety and disease processes Patient/caregiver will attend scheduled medical appointments  Interventions:  1:1 collaboration with [name], [name], DO regarding development and update of comprehensive plan of care as evidenced by provider attestation and co-signature Inter-disciplinary care team collaboration (see longitudinal plan of care) Evaluation of current treatment plan related to disease processes and patient's adherence to plan as established by provider. Encouraged daughter to continue to  attend provider visits as scheduled, contact provider for any new or worsening symptoms. Reviewed medications Discussed plans with patient for ongoing care management follow up and provided patient with direct contact information for care management team embedded social work referral - currently keeping client in the home for as long as possible, but would like to know the placement process. Patient Goals/Self-Care Activities Continue to take medications as prescribed Continue to attend all scheduled provider appointments Continue to call pharmacy for medication refills Continue to provide meaningful experiences spending outdoor time or time in [place] and outings as tolerated.  Continue to maintain Fall prevention strategies: keep walkways clear, limit or take out throw rugs (could easily become trip hazards), keep good lighting in the home, keep a flashlight near your bed, arrange solid pieces of furniture so  that there may be a place to rest throughout the house if needed; change positions slowly. Grab bars in shower and/or next to toilet are useful. Avoid walking around barefoot and  don't walk around in slippers, stocking or socks. Instead wear low-heeled, comfortable shoes with rubber soles. Contact provider if you have fallen, even if there was no injury. Continue to work with home health agency staff as scheduled.  Patient will call provider office for new concerns or questions Work with Education officer, museum as needed Follow Up Plan: daughter reports anxiety currently managed. Will include anxiety follow up under "quality of life maintained"    Long-Range Goal: Quality of Life Maintained   Start Date: 04/22/2021  Expected End Date: 09/22/2021  Recent Progress: On track  Priority: High  Note:   Current Barriers: Long term care plan for management of Quality of Life in a patient with chronic CHF, A-fib, HTN, UTI, CKD stage III, dementia, anxiety. RNCM called to discuss in home foot care management with Mrs. Priscille Heidelberg. She reports caregiver service not available today, no consistent care giver service. She states she has had to change agencies 3 times. She would like to go ahead with plans for placement in a memory care. Her choice is [facility]. She states she is going to call [facility] today.  Ineffective Self Health Maintenance  Clinical Goal(s):  Collaboration with [name], [name], DO regarding development and update of comprehensive plan of care as evidenced by provider attestation and co-signature Inter-disciplinary care team collaboration (see longitudinal plan of care) Patient/caregiver will work with care management team to address care coordination and chronic disease management needs related to Disease Management, Educational Needs, Care Coordination, Dementia and Caregiver Support, Level of Care Concerns   Interventions:  1:1 collaboration with [name], [name], DO regarding  development and update of comprehensive plan of care as evidenced by provider attestation and co-signature Inter-disciplinary care team collaboration (see longitudinal plan of care) Evaluation of current treatment plan related to disease processes and patient's adherence to plan as established by provider. and patient's adherence to plan as established by provider. Re-iterated the importance of good skin care.   Medications reviewed. RNCM will attempt to locate resources for foot care management. Discussed upcoming appointments Palliative services discussed-daughter reports positive feedback regarding the implementation of palliative care services. Discussed plans with daughter/caregiver/DPR for ongoing care management follow up and provided patient with direct contact information for care management team. Update 07/02/21: RNCM will re-consult clinical LCSW for assistance with placement and update PCP. Provided list of personal care service agencies. Patient Goals/Self-Care Activities Plan to receive a call from social worker to assist with placement. Continue to change position at least every two hours Continue to provide good skin care and monitor skin for any new or worsening areas Continue good nutrition: eat foods high in calories and protein, stay hydrated,  Continue to take medications as prescribed Continue to attend all scheduled provider appointments Continue to provide meaningful experiences  Continue to maintain Fall prevention strategies: keep walkways clear, limit or take out throw rugs (could easily become trip hazards), keep good lighting in the home, keep a flashlight near your bed, arrange solid pieces of furniture so that there may be a place to rest throughout the  house if needed; change positions slowly. Grab bars in shower and/or next to toilet are useful. Avoid walking around barefoot and  don't walk around in slippers, stocking or socks. Instead wear low-heeled, comfortable  shoes with rubber soles. Contact provider if you have fallen, even if there was no injury. Continue to be as active as possible Call your provider office for new concerns or questions Continue to work with Chronic care management team for ongoing case management and care coordination needs.  Follow Up Plan: Telephone follow up appointment with care management team member scheduled for: 08/03/21 The patient has been provided with contact information for the care management team and has been advised to call with any health related questions or concerns.     Patient Care Plan: LCSW Plan of Care     Problem Identified: Maintain My Quality of Life through Placement into a Higher Level of Care.   Priority: High     Goal: Maintain My Quality of Life through Placement into a Higher Level of Care.   Start Date: 07/08/2021  Expected End Date: 09/07/2021  This Visit's Progress: On track  Recent Progress: On track  Priority: High  Note:   Current Barriers:   Patient with Alzheimer's Dementia without Behavioral Disturbance, Unspecified Timing of Dementia Onset, Congestive Heart Failure, Unspecified HF Chronicity, Unspecified Heart Failure Type, Dizziness, Shortness of Breath, Memory Loss, Primary Osteoarthritis of Right Knee, Cerebral Aneurysm Rupture and Anxiety needs Support, Education, Referrals, Resources and Care Coordination to resolve unmet personal care needs in the home through placement into a higher level of care. Clinical Goal(s):  Patient's daughter will work with LCSW to obtain placement for patient into a long-term memory care assisted living facility.   LCSW Interventions:  Inter-disciplinary care team collaboration (see longitudinal plan of care). Collaboration with Primary Care Physician, Dr. Roma Schanz, regarding development and update of comprehensive plan of care as evidenced by provider attestation and co-signature. Collaboration with Primary Care Physician, Dr. Roma Schanz, to request completion of an FL-2 Form for placement purposes. Level of care options, assisted living facility placement process and insurance coverage discussed with patient's daughter, and completion of a [place] Medicaid application was encouraged.   Patient Goals/Self-Care Activities: Daughter will accept weekly/bi-weekly calls from LCSW, in an effort to coordinate long-term care placement for you in a memory care assisted living facility.   Daughter agreed to tour all facilities of interest, which include [facility] in [place], [place]. Keep home assessment with Admissions Coordinator from [facility], scheduled for 12:00 noon on 07/13/2021. Daughter will contact LCSW directly (# [phone]) if she has questions, needs assistance, or if additional social work needs are identified between now and our next scheduled telephone outreach call.   Follow-Up Plan:  LCSW will make contact with patient's on 07/13/2021 at 2:45pm

## 2021-07-13 ENCOUNTER — Telehealth: Payer: Self-pay | Admitting: Family Medicine

## 2021-07-13 ENCOUNTER — Ambulatory Visit: Payer: Medicare Other | Admitting: *Deleted

## 2021-07-13 DIAGNOSIS — R413 Other amnesia: Secondary | ICD-10-CM

## 2021-07-13 DIAGNOSIS — F0280 Dementia in other diseases classified elsewhere without behavioral disturbance: Secondary | ICD-10-CM

## 2021-07-13 DIAGNOSIS — M1711 Unilateral primary osteoarthritis, right knee: Secondary | ICD-10-CM

## 2021-07-13 DIAGNOSIS — G309 Alzheimer's disease, unspecified: Secondary | ICD-10-CM

## 2021-07-13 DIAGNOSIS — I509 Heart failure, unspecified: Secondary | ICD-10-CM

## 2021-07-13 DIAGNOSIS — I10 Essential (primary) hypertension: Secondary | ICD-10-CM

## 2021-07-13 NOTE — Patient Instructions (Signed)
Visit Information  PATIENT GOALS:  Goals Addressed             This Visit's Progress    Maintain My Quality of Life through Placement into a Higher Level of Care.   On track    Timeframe:  Short-Term Goal Priority:  High Start Date:  07/08/2021                        Expected End Date:  09/07/2021              Follow-Up Date:  07/19/2021 at 12:30pm  Patient Goals/Self-Care Activities:  Daughter will accept weekly/bi-weekly calls from LCSW, in an effort to coordinate long-term care placement for you in a memory care assisted living facility.   Confirmation received from daughter that you will be moving into [facility] [phone] in [place]) on 07/16/2021. Daughter will contact LCSW directly (# [phone]) if she has questions, needs assistance, or if additional social work needs are identified between now and our next scheduled telephone outreach call.            Patient verbalizes understanding of instructions provided today and agrees to view in MyChart.   Telephone follow up appointment with care management team member scheduled for:  07/19/2021 at 12:30pm  Danford Bad LCSW Licensed Clinical Social Worker [facility] [phone]

## 2021-07-13 NOTE — Telephone Encounter (Signed)
Patients daughter brought in form for [facility]  Placed into lowne bin up front  Call [phone] when ready to pick up

## 2021-07-13 NOTE — Chronic Care Management (AMB) (Signed)
Chronic Care Management    Clinical Social Work Note  07/13/2021 Name: Belinda Anderson MRN: [id number] DOB: Sep 30, 1932  Belinda Anderson is a 87 y.o. year old female who is a primary care patient of [name], DO. The CCM team was consulted to assist the patient with chronic disease management and/or care coordination needs related to: Level of Care Concerns.   Engaged with patient's daughter by telephone for follow-up visit in response to provider referral for social work chronic care management and care coordination services.   Consent to Services:  The patient was given information about Chronic Care Management services, agreed to services, and gave verbal consent prior to initiation of services.  Please see initial visit note for detailed documentation.   Patient agreed to services and consent obtained.   Assessment: Review of patient past medical history, allergies, medications, and health status, including review of relevant consultants reports was performed today as part of a comprehensive evaluation and provision of chronic care management and care coordination services.     SDOH (Social Determinants of Health) assessments and interventions performed:    Advanced Directives Status: Not addressed in this encounter.  CCM Care Plan  No Known Allergies  Outpatient Encounter Medications as of 07/13/2021  Medication Sig Note   acetaminophen (TYLENOL) 650 MG CR tablet Take 1,300 mg by mouth daily as needed for pain (arthritis).     COVID-19 mRNA Vac-TriS, Pfizer, SUSP injection Inject into the muscle.    diclofenac Sodium (VOLTAREN) 1 % GEL Apply 2 g topically daily as needed (pain).    diltiazem (CARDIZEM) 60 MG tablet Take 1 tablet (60 mg total) by mouth daily.    furosemide (LASIX) 20 MG tablet TAKE 2 TABLETS BY MOUTH EVERY DAY, MAY TAKE AN ADDITIONAL TABLET AS NEEDED FOR SWELLING. 04/22/2021: Reports taking 40 mg before breakfast and 20 mg with lunch for a total of 60 mg  daily   Glycerin-Hypromellose-PEG 400 ([employer] DRY EYE RELIEF) 0.2-0.2-1 % SOLN Place 1 drop into both eyes daily as needed (Dry eye).    haloperidol (HALDOL) 1 MG tablet TAKE 1 TABLET(1 MG) BY MOUTH EVERY 8 HOURS AS NEEDED FOR AGITATION    levothyroxine (SYNTHROID) 75 MCG tablet Take 1 tablet (75 mcg total) by mouth daily before breakfast.    NONFORMULARY OR COMPOUNDED ITEM rollator walker  #1  As directed -------  Dx  CHF,  DOE,  OA    nystatin (MYCOSTATIN/NYSTOP) powder Apply 1 application topically 3 (three) times daily.    potassium chloride SA (KLOR-CON) 20 MEQ tablet Take 1 tablet (20 mEq total) by mouth daily.    sertraline (ZOLOFT) 50 MG tablet TAKE 1 TABLET(50 MG) BY MOUTH DAILY    spironolactone (ALDACTONE) 25 MG tablet Take 1 tablet (25 mg total) by mouth every other day.    sulfamethoxazole-trimethoprim (BACTRIM) 400-80 MG tablet Take 1 tablet by mouth 2 (two) times daily.    XARELTO 15 MG TABS tablet TAKE 1 TABLET(15 MG) BY MOUTH DAILY WITH BREAKFAST. DISCONTINUE ELIQUIS    No facility-administered encounter medications on file as of 07/13/2021.    Patient Active Problem List   Diagnosis Date Noted   Pressure ulcer of sacral region, stage 1 04/23/2021   Intertrigo 04/23/2021   Abnormal urine odor 04/23/2021   Left foot pain 02/10/2021   Weakness 02/10/2021   Urinary tract infection without hematuria 02/10/2021   Cellulitis 02/10/2021   Dementia (HCC) 02/10/2021   Gout 02/10/2021   SOB (shortness of breath) 02/05/2021  CKD (chronic kidney disease), stage III (HCC) 02/05/2021   Anxiety 12/24/2020   Impacted cerumen of right ear 12/24/2020   Acute on chronic diastolic CHF (congestive heart failure) (HCC) 11/26/2020   Bilateral leg edema 01/30/2020   COVID-19 virus infection 11/21/2019   Cough 11/21/2019   External hemorrhoid, bleeding 10/12/2019   AF (paroxysmal atrial fibrillation) (HCC) 10/02/2019   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 (HCC) s/p IR R MCA  M2 09/30/2019   Middle cerebral artery embolism, right 09/30/2019   Contusion of foot including toes, right, initial encounter 08/10/2018   Primary osteoarthritis of right knee 07/31/2018   Right leg swelling 07/17/2018   Pain of right calf 07/17/2018   Preventative health care 09/18/2017   Low back pain radiating to left leg 11/28/2016   Hyperlipidemia 02/02/2015   Sciatica 07/16/2014   Knee pain, acute 07/16/2014   Lower extremity pain, right 07/16/2014   Skin infection 07/16/2014   Incontinence of urine in female 06/19/2013   Cerebral aneurysm rupture (HCC) 04/08/2013   LEG EDEMA, RIGHT 07/06/2010   OTHER DRUG ALLERGY 07/06/2010   DIARRHEA 02/12/2010   HEMATURIA, HX OF 11/24/2009   ANEURYSM, HX OF 08/03/2009   B12 deficiency 07/24/2009   ACQUIRED HEMOLYTIC ANEMIA UNSPECIFIED 07/24/2009   SYNCOPE 07/20/2009   DIZZINESS 07/20/2009   Memory loss 07/20/2009   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  Limited Social Support, Level of Care Concerns, ADL/IADL Limitations, Limited Access to Caregiver, Cognitive Deficits, Memory Deficits, and Lacks Knowledge of [employer].  Care Plan : LCSW Plan of Care  Updates made by Karolee Stamps, LCSW since 07/13/2021 12:00 AM     Problem: Maintain My Quality of Life through Placement into a Higher Level of Care.   Priority: High     Goal: Maintain My Quality of Life through Placement into a Higher Level of Care.   Start Date: 07/08/2021  Expected End Date: 09/07/2021  This Visit's Progress: On track  Recent Progress: On track  Priority: High  Note:   Current Barriers:   Patient with Alzheimer's Dementia without Behavioral Disturbance, Unspecified  Timing of Dementia Onset, Congestive Heart Failure, Unspecified HF Chronicity, Unspecified Heart Failure Type, Dizziness, Shortness of Breath, Memory Loss, Primary Osteoarthritis of Right Knee, Cerebral Aneurysm Rupture and Anxiety needs Support, Education, Referrals, Resources and Care Coordination to resolve unmet personal care needs in the home through placement into a higher level of care. Clinical Goal(s):  Patient's daughter will work with LCSW to obtain placement for patient into a long-term memory care assisted living facility.   LCSW Interventions:  Collaboration with Primary Care Physician, Dr. Seabron Spates, regarding development and update of comprehensive plan of care as evidenced by provider attestation and co-signature. Collaboration with Primary Care Physician, Dr. Seabron Spates, to request completion of an FL-2 Form for placement purposes. Patient Goals/Self-Care Activities: Daughter will accept weekly/bi-weekly calls from LCSW, in an effort to coordinate long-term care placement for you in a memory care assisted living facility.   Confirmation received from daughter that you will be moving into [facility] Facility [phone] in [place]) on 07/16/2021. Daughter will contact LCSW directly (# [phone]) if she has questions, needs  assistance, or if additional social work needs are identified between now and our next scheduled telephone outreach call.   Follow-Up Plan:  LCSW will make contact with patient's on 07/19/2021 at 12:00pm.     Danford Bad LCSW Licensed Clinical Social Worker [facility] [phone]

## 2021-07-14 NOTE — Telephone Encounter (Signed)
Received

## 2021-07-15 ENCOUNTER — Telehealth: Payer: Self-pay | Admitting: Family Medicine

## 2021-07-15 ENCOUNTER — Ambulatory Visit: Payer: Medicare Other | Admitting: Podiatrist

## 2021-07-15 ENCOUNTER — Other Ambulatory Visit: Payer: Self-pay

## 2021-07-15 ENCOUNTER — Encounter: Payer: Self-pay | Admitting: Podiatrist

## 2021-07-15 DIAGNOSIS — B351 Tinea unguium: Secondary | ICD-10-CM

## 2021-07-15 DIAGNOSIS — Z0279 Encounter for issue of other medical certificate: Secondary | ICD-10-CM

## 2021-07-15 DIAGNOSIS — M79609 Pain in unspecified limb: Secondary | ICD-10-CM | POA: Diagnosis not present

## 2021-07-15 NOTE — Telephone Encounter (Signed)
Left message for patient to call back and schedule Medicare Annual Wellness Visit (AWV) in office.   If not able to come in office, please offer to do virtually or by telephone.  Left office number and my jabber [phone].  Last AWV:04/06/2018  Please schedule at anytime with Nurse Health Advisor.

## 2021-07-15 NOTE — Progress Notes (Signed)
HPI: Patient is 87 y.o. female who presents today for elongated and thickened toenails which are difficult to trim.  They are painful in shoes and ambulation when they become this long.  She is also on a blood thinner.  She presents today with her caregiver.  Patient Active Problem List   Diagnosis Date Noted   Pressure ulcer of sacral region, stage 1 04/23/2021   Intertrigo 04/23/2021   Abnormal urine odor 04/23/2021   Left foot pain 02/10/2021   Weakness 02/10/2021   Urinary tract infection without hematuria 02/10/2021   Cellulitis 02/10/2021   Dementia (HCC) 02/10/2021   Gout 02/10/2021   SOB (shortness of breath) 02/05/2021   CKD (chronic kidney disease), stage III (HCC) 02/05/2021   Anxiety 12/24/2020   Impacted cerumen of right ear 12/24/2020   Acute on chronic diastolic CHF (congestive heart failure) (HCC) 11/26/2020   Bilateral leg edema 01/30/2020   COVID-19 virus infection 11/21/2019   Cough 11/21/2019   External hemorrhoid, bleeding 10/12/2019   AF (paroxysmal atrial fibrillation) (HCC) 10/02/2019   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 09/30/2019   Middle cerebral artery embolism, right 09/30/2019   Contusion of foot including toes, right, initial encounter 08/10/2018   Primary osteoarthritis of right knee 07/31/2018   Right leg swelling 07/17/2018   Pain of right calf 07/17/2018   Preventative health care 09/18/2017   Low back pain radiating to left leg 11/28/2016   Hyperlipidemia 02/02/2015   Sciatica 07/16/2014   Knee pain, acute 07/16/2014   Lower extremity pain, right 07/16/2014   Skin infection 07/16/2014   Incontinence of urine in female 06/19/2013   Cerebral aneurysm rupture (HCC) 04/08/2013   LEG EDEMA, RIGHT 07/06/2010   OTHER DRUG ALLERGY 07/06/2010   DIARRHEA 02/12/2010   HEMATURIA, HX OF 11/24/2009   ANEURYSM, HX OF 08/03/2009   B12 deficiency 07/24/2009   ACQUIRED HEMOLYTIC ANEMIA UNSPECIFIED  07/24/2009   SYNCOPE 07/20/2009   DIZZINESS 07/20/2009   Memory loss 07/20/2009   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      COVID-19 mRNA Vac-TriS, Pfizer, SUSP injection Inject into the muscle. 0.3 mL 0   diclofenac Sodium (VOLTAREN) 1 % GEL Apply 2 g topically daily as needed (pain). 150 g 3   diltiazem (CARDIZEM) 60 MG tablet Take 1 tablet (60 mg total) by mouth daily. 30 tablet 3   furosemide (LASIX) 20 MG tablet TAKE 2 TABLETS BY MOUTH EVERY DAY, MAY TAKE AN ADDITIONAL TABLET AS NEEDED FOR SWELLING. 270 tablet 2   Glycerin-Hypromellose-PEG 400 ([employer] DRY EYE RELIEF) 0.2-0.2-1 % SOLN Place 1 drop into both eyes daily as needed (Dry eye).     haloperidol (HALDOL) 1 MG tablet TAKE 1 TABLET(1 MG) BY MOUTH EVERY 8 HOURS AS NEEDED FOR AGITATION 60 tablet 1   levothyroxine (SYNTHROID) 75 MCG tablet Take 1 tablet (75 mcg total) by mouth daily before breakfast. 90 tablet 0   NONFORMULARY OR COMPOUNDED ITEM rollator walker  #1  As directed -------  Dx  CHF,  DOE,  OA 1 each 0   nystatin (MYCOSTATIN/NYSTOP) powder Apply 1 application topically 3 (three) times daily. 60 g 3   potassium chloride SA (KLOR-CON) 20 MEQ tablet Take 1 tablet (20 mEq total) by mouth daily. 90 tablet 3   sertraline (ZOLOFT) 50 MG tablet TAKE 1 TABLET(50 MG) BY MOUTH DAILY 90 tablet 0   spironolactone (ALDACTONE) 25 MG tablet Take 1 tablet (25 mg total) by mouth every other day. 45 tablet 3   sulfamethoxazole-trimethoprim (BACTRIM) 400-80 MG tablet Take 1 tablet by mouth 2 (two) times daily. 10 tablet 0   XARELTO  15 MG TABS tablet TAKE 1 TABLET(15 MG) BY MOUTH DAILY WITH BREAKFAST. DISCONTINUE ELIQUIS 90 tablet 0   No current facility-administered medications on file prior to visit.    No Known Allergies  Review of Systems No fevers, chills, nausea, muscle aches, no difficulty breathing, no calf pain, no chest pain or shortness of breath.   Physical Exam  GENERAL APPEARANCE: Alert, conversant. Appropriately groomed. No acute distress.   VASCULAR: Pedal pulses palpable DP and PT bilateral.  Capillary refill time is immediate to all digits,  Proximal to distal cooling is cool to cool purplish discoloration around the base of the hallux is seen consistent with what appears to be Raynaud's phenomenon.  NEUROLOGIC: sensation is intact to 5.07 monofilament at 5/5 sites bilateral.  Light touch is intact bilateral, vibratory sensation intact bilateral  MUSCULOSKELETAL: acceptable muscle strength, tone and stability bilateral.  No gross boney pedal deformities noted.  No pain, crepitus or limitation noted with foot and ankle range of motion bilateral.   DERMATOLOGIC: skin is warm, supple, and dry.  No open lesions noted.  No rash, no pre ulcerative lesions.  Digital nails are elongated, thickened, dystrophic, brittle and clinically mycotic bilateral.  Some discomfort with dorsal plantar pressure is also elicited with exam.    Assessment   1. Pain due to onychomycosis of nail      Plan  Debridement of toenails was recommended.  Onychoreduction of symptomatic toenails was performed via nail nipper and power burr without iatrogenic incident.  Patient was instructed on signs and symptoms of infection and was told to call immediately should any of these arise.  Recommended follow up in 3 months or instructed to call sooner if any pedal concerns arise.

## 2021-07-15 NOTE — Patient Instructions (Signed)

## 2021-07-16 DIAGNOSIS — I509 Heart failure, unspecified: Secondary | ICD-10-CM

## 2021-07-16 DIAGNOSIS — G309 Alzheimer's disease, unspecified: Secondary | ICD-10-CM

## 2021-07-16 DIAGNOSIS — I10 Essential (primary) hypertension: Secondary | ICD-10-CM | POA: Diagnosis not present

## 2021-07-16 DIAGNOSIS — F0280 Dementia in other diseases classified elsewhere without behavioral disturbance: Secondary | ICD-10-CM

## 2021-07-16 DIAGNOSIS — I607 Nontraumatic subarachnoid hemorrhage from unspecified intracranial artery: Secondary | ICD-10-CM

## 2021-07-16 DIAGNOSIS — M1711 Unilateral primary osteoarthritis, right knee: Secondary | ICD-10-CM

## 2021-07-18 ENCOUNTER — Other Ambulatory Visit: Payer: Self-pay | Admitting: Family Medicine

## 2021-07-18 DIAGNOSIS — E039 Hypothyroidism, unspecified: Secondary | ICD-10-CM

## 2021-07-19 ENCOUNTER — Ambulatory Visit (INDEPENDENT_AMBULATORY_CARE_PROVIDER_SITE_OTHER): Payer: Medicare Other | Admitting: *Deleted

## 2021-07-19 DIAGNOSIS — R413 Other amnesia: Secondary | ICD-10-CM

## 2021-07-19 DIAGNOSIS — F419 Anxiety disorder, unspecified: Secondary | ICD-10-CM

## 2021-07-19 DIAGNOSIS — R531 Weakness: Secondary | ICD-10-CM

## 2021-07-19 DIAGNOSIS — I607 Nontraumatic subarachnoid hemorrhage from unspecified intracranial artery: Secondary | ICD-10-CM

## 2021-07-19 DIAGNOSIS — R42 Dizziness and giddiness: Secondary | ICD-10-CM

## 2021-07-19 DIAGNOSIS — M1711 Unilateral primary osteoarthritis, right knee: Secondary | ICD-10-CM

## 2021-07-19 DIAGNOSIS — I509 Heart failure, unspecified: Secondary | ICD-10-CM

## 2021-07-19 NOTE — Chronic Care Management (AMB) (Signed)
Chronic Care Management    Clinical Social Work Note  07/19/2021 Name: Belinda Anderson MRN: [id number] DOB: 1932-09-25  Belinda Anderson is a 87 y.o. year old female who is a primary care patient of [name], DO. The CCM team was consulted to assist the patient with chronic disease management and/or care coordination needs related to: Level of Care Concerns.   Engaged with patient's daughter on the phone for follow-up visit in response to provider referral for social work chronic care management and care coordination services.   Consent to Services:  The patient was given information about Chronic Care Management services, agreed to services, and gave verbal consent prior to initiation of services.  Please see initial visit note for detailed documentation.   Patient agreed to services and consent obtained.   Assessment: Review of patient past medical history, allergies, medications, and health status, including review of relevant consultants reports was performed today as part of a comprehensive evaluation and provision of chronic care management and care coordination services.     SDOH (Social Determinants of Health) assessments and interventions performed:    Advanced Directives Status: Not addressed in this encounter.  CCM Care Plan  No Known Allergies  Outpatient Encounter Medications as of 07/19/2021  Medication Sig Note   acetaminophen (TYLENOL) 650 MG CR tablet Take 1,300 mg by mouth daily as needed for pain (arthritis).     COVID-19 mRNA Vac-TriS, Pfizer, SUSP injection Inject into the muscle.    diclofenac Sodium (VOLTAREN) 1 % GEL Apply 2 g topically daily as needed (pain).    diltiazem (CARDIZEM) 60 MG tablet Take 1 tablet (60 mg total) by mouth daily.    furosemide (LASIX) 20 MG tablet TAKE 2 TABLETS BY MOUTH EVERY DAY, MAY TAKE AN ADDITIONAL TABLET AS NEEDED FOR SWELLING. 04/22/2021: Reports taking 40 mg before breakfast and 20 mg with lunch for a total of 60 mg  daily   Glycerin-Hypromellose-PEG 400 ([employer] DRY EYE RELIEF) 0.2-0.2-1 % SOLN Place 1 drop into both eyes daily as needed (Dry eye).    haloperidol (HALDOL) 1 MG tablet TAKE 1 TABLET(1 MG) BY MOUTH EVERY 8 HOURS AS NEEDED FOR AGITATION    levothyroxine (SYNTHROID) 75 MCG tablet Take 1 tablet (75 mcg total) by mouth daily before breakfast.    NONFORMULARY OR COMPOUNDED ITEM rollator walker  #1  As directed -------  Dx  CHF,  DOE,  OA    nystatin (MYCOSTATIN/NYSTOP) powder Apply 1 application topically 3 (three) times daily.    potassium chloride SA (KLOR-CON) 20 MEQ tablet Take 1 tablet (20 mEq total) by mouth daily.    sertraline (ZOLOFT) 50 MG tablet TAKE 1 TABLET(50 MG) BY MOUTH DAILY    spironolactone (ALDACTONE) 25 MG tablet Take 1 tablet (25 mg total) by mouth every other day.    sulfamethoxazole-trimethoprim (BACTRIM) 400-80 MG tablet Take 1 tablet by mouth 2 (two) times daily.    XARELTO 15 MG TABS tablet TAKE 1 TABLET(15 MG) BY MOUTH DAILY WITH BREAKFAST. DISCONTINUE ELIQUIS    No facility-administered encounter medications on file as of 07/19/2021.    Patient Active Problem List   Diagnosis Date Noted   Pressure ulcer of sacral region, stage 1 04/23/2021   Intertrigo 04/23/2021   Abnormal urine odor 04/23/2021   Left foot pain 02/10/2021   Weakness 02/10/2021   Urinary tract infection without hematuria 02/10/2021   Cellulitis 02/10/2021   Dementia (HCC) 02/10/2021   Gout 02/10/2021   SOB (shortness of breath) 02/05/2021  CKD (chronic kidney disease), stage III (HCC) 02/05/2021   Anxiety 12/24/2020   Impacted cerumen of right ear 12/24/2020   Acute on chronic diastolic CHF (congestive heart failure) (HCC) 11/26/2020   Bilateral leg edema 01/30/2020   COVID-19 virus infection 11/21/2019   Cough 11/21/2019   External hemorrhoid, bleeding 10/12/2019   AF (paroxysmal atrial fibrillation) (HCC) 10/02/2019   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 (HCC) s/p IR R MCA  M2 09/30/2019   Middle cerebral artery embolism, right 09/30/2019   Contusion of foot including toes, right, initial encounter 08/10/2018   Primary osteoarthritis of right knee 07/31/2018   Right leg swelling 07/17/2018   Pain of right calf 07/17/2018   Preventative health care 09/18/2017   Low back pain radiating to left leg 11/28/2016   Hyperlipidemia 02/02/2015   Sciatica 07/16/2014   Knee pain, acute 07/16/2014   Lower extremity pain, right 07/16/2014   Skin infection 07/16/2014   Incontinence of urine in female 06/19/2013   Cerebral aneurysm rupture (HCC) 04/08/2013   LEG EDEMA, RIGHT 07/06/2010   OTHER DRUG ALLERGY 07/06/2010   DIARRHEA 02/12/2010   HEMATURIA, HX OF 11/24/2009   ANEURYSM, HX OF 08/03/2009   B12 deficiency 07/24/2009   ACQUIRED HEMOLYTIC ANEMIA UNSPECIFIED 07/24/2009   SYNCOPE 07/20/2009   DIZZINESS 07/20/2009   Memory loss 07/20/2009   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  Level of Care Concerns.  Care Plan : LCSW Plan of Care  Updates made by Karolee Stamps, LCSW since 07/19/2021 12:00 AM     Problem: Maintain My Quality of Life through Placement into a Higher Level of Care.   Priority: High     Goal: Maintain My Quality of Life through Placement into a Higher Level of Care.   Start Date: 07/08/2021  Expected End Date: 09/07/2021  This Visit's Progress: On track  Recent Progress: On track  Priority: High  Note:   Current Barriers:   Patient with Alzheimer's Dementia without Behavioral Disturbance, Unspecified Timing of Dementia Onset, Congestive Heart Failure, Unspecified HF Chronicity, Unspecified Heart Failure Type, Dizziness, Shortness of Breath, Memory Loss, Primary  Osteoarthritis of Right Knee, Cerebral Aneurysm Rupture and Anxiety needs Support, Education, Referrals, Resources and Care Coordination to resolve unmet personal care needs in the home through placement into a higher level of care. Clinical Goal(s):  Patient's daughter will work with LCSW to obtain placement for patient into a long-term memory care assisted living facility.   LCSW Interventions:  Collaboration with Primary Care Physician, Dr. Seabron Spates, regarding development and update of comprehensive plan of care as evidenced by provider attestation and co-signature. Patient Goals/Self-Care Activities: [name] will accept weekly/bi-weekly calls from LCSW, in an effort to coordinate long-term care placement in a memory care assisted living facility, contacting LCSW directly (# [phone]) for questions, assistance, or if additional social work needs are identified between now and our next scheduled telephone outreach call.   Confirmation received from [name] that a bed will be available at [facility] - [address] in [place], [place] [phone]), on 07/22/2021. LCSW collaboration with Primary Care Physician, Dr. Seabron Spates to request that updated, completed and signed FL-2 Form be faxed directly  to [employer], Admissions Coordinator at [facility] (Fax # [phone]), at earliest convenience. Follow-Up Plan:  LCSW will make contact with [name] on 07/26/2021 at 4:00pm     Danford Bad LCSW Licensed Clinical Social Worker [facility] [phone]

## 2021-07-19 NOTE — Patient Instructions (Signed)
Visit Information  PATIENT GOALS:  Goals Addressed             This Visit's Progress    Maintain My Quality of Life through Placement into a Higher Level of Care.   On track    Timeframe:  Short-Term Goal Priority:  High Start Date:  07/08/2021                        Expected End Date:  09/07/2021              Follow-Up Date:  07/26/2021 at 4:00pm  Patient Goals/Self-Care Activities:  Mrs. Belinda Anderson will accept weekly/bi-weekly calls from LCSW, in an effort to coordinate long-term care placement in a memory care assisted living facility, contacting LCSW directly (# [phone]) for questions, assistance, or if additional social work needs are identified between now and our next scheduled telephone outreach call.   Confirmation received from Mrs. Belinda Anderson that a bed will be available at [facility] - [address] in [place], [place] [phone]), on 07/22/2021. LCSW collaboration with Primary Care Physician, Dr. Seabron Spates to request that updated, completed and signed FL-2 Form be faxed directly to [facility], Admissions Coordinator at [facility] (Fax #  [phone]), at earliest convenience.          Patient verbalizes understanding of instructions provided today and agrees to view in MyChart.   Telephone follow up appointment with care management team member scheduled for:  07/26/2021 at 4:00pm  Danford Bad LCSW Licensed Clinical Social Worker [facility] [phone]

## 2021-07-21 NOTE — Telephone Encounter (Signed)
Corrected forms faxed again

## 2021-07-21 NOTE — Progress Notes (Signed)
Cardiology Office Note Date:  07/21/2021  Patient ID:  Belinda Anderson, Belinda Anderson 1932-06-12, MRN [id number] PCP:  [name], Grayling Congress, DO  Electrophysiologist: Dr. Elberta Fortis    Chief Complaint:  planned f/u  History of Present Illness: Belinda Anderson is a 87 y.o. female with history of HTN, HLD, hypothyroid,  stroke, permanent Afib, chronic CHF (diastolic), CKD (III), depression, dementia.  She was admitted to [facility] 02/04/21 with acute/chronic CHF manged with the IM team. BNP of 306, troponin negative x 2.  EKG shows A. Fib,  rate controlled no ST-T wave changes. She was admitted for acute on chronic diastolic heart failure exacerbation.  She is also found to have a UTI and started on IV ceftriaxone. Patient continued to have clinical improvement, Lasix deescalated to home dosing. UTI treated. Her mentation returned to her baseline dementia.  Discharged 02/08/21  She saw Dr. Abner Greenspan the day after admission, noting family having increasing difficulty in caring for the pt at home, needs to consider SNF.   L foot was painful and swollen planned for abx for cellulitis.  Plans for home health RN, PT, OT eval with steps towards placement  I saw her 02/16/21 She is accompanied by her daughter today PT has helped, walking with a walker, doing a bit better. She sleeps in bed with one pillow, no reports of symptoms of PND or orthopnea by the pt or her daughter though her daughter says her sister reports that in the morning she has a congested cough that will clear All in all breathing is much better, edema and abd bloating are much better as well Gained 1.5lbs from yesterday L foot erythema and swelling is resolved to her baseline, reports ultimately was gout and treated with colchicine and the antibiotic with some diarrhea resulting Both completed now and hopefully bowel regime will return to normal No near syncope or syncope No bleeding or signs of bleeding They have a CNA during the day to help along with  family and family is with her nights We reviewed daily weights, instructions on use of a PRN lasix dose, planned for 2 days of this with some weight gain and trace edema at her visit, and another early follow visit. BMET this day was stable   And again 03/01/21 She is again accompanied by her daughter Generally about the same. Her weight at home fluctuating, will gain 3 pounds in a day or so the extra lasix helps and comes back down though within a few days again starts to creep back up. They are quite diligent about low sodium food for her Her PT last week noted crackles in L lung and PMD had a CXR done though unknown result She has SOB intermittently her daughter suspects may be a component of anxiety as well. Today gave her a Haldol and helps her. No CP, palpitations or cardiac awareness are reported No near fainting or fainting Sleep is interrupted with need to urinate, no reports of symptoms of PND or orthopnea No bleeding or signs of bleeding She has excellent family support and an aid to help them at home as well.  Is living with her other daughter now. added spironolactone, Reduced her Kdur in 1/2 to one tab daily Planned for labs and early follow up  She saw Dr. Elberta Fortis 04/01/21, not clearly volume OL and did not think CHF explained all of her SOB, recommended discussion with her PMD for consideration of pulmonary evaluation.  At her last PMD visit, did not c/o SOB,  concerns were of some wounds she had.  TODAY Again accompanied by her daughter Planning transition to senior/assisted living soon. Doing well at home overall, still seems to get SOB sometimes without clear provocation, of late noteed after dinner. She walks in the house with her rolling walker, takes the cat for walks around the house in the basket Generally doing OK with this No near syncope or syncope No CP, palpitations, cardiac awareness  Weight is stable at home  No bleeding or signs of bleeding    Past  Medical History:  Diagnosis Date   Arthritis    Atrial fibrillation (HCC)    Hypertension    Thyroid disease    TIA (transient ischemic attack)     Past Surgical History:  Procedure Laterality Date   CARDIOVERSION N/A 04/15/2020   Procedure: CARDIOVERSION;  Surgeon: Parke Poisson, MD;  Location: [facility] ENDOSCOPY;  Service: Cardiovascular;  Laterality: N/A;   EYE SURGERY     lens implant   IR ANGIO VERTEBRAL SEL SUBCLAVIAN INNOMINATE UNI R MOD SED  09/30/2019   IR CT HEAD LTD  09/30/2019   IR PERCUTANEOUS ART THROMBECTOMY/INFUSION INTRACRANIAL INC DIAG ANGIO  09/30/2019   RADIOLOGY WITH ANESTHESIA N/A 09/30/2019   Procedure: IR WITH ANESTHESIA;  Surgeon: Julieanne Cotton, MD;  Location: [facility] OR;  Service: Radiology;  Laterality: N/A;   TEE WITHOUT CARDIOVERSION N/A 04/15/2020   Procedure: TRANSESOPHAGEAL ECHOCARDIOGRAM (TEE);  Surgeon: Parke Poisson, MD;  Location: [facility] ENDOSCOPY;  Service: Cardiovascular;  Laterality: N/A;    Current Outpatient Medications  Medication Sig Dispense Refill   levothyroxine (SYNTHROID) 75 MCG tablet TAKE 1 TABLET(75 MCG) BY MOUTH DAILY 90 tablet 0   acetaminophen (TYLENOL) 650 MG CR tablet Take 1,300 mg by mouth daily as needed for pain (arthritis).      COVID-19 mRNA Vac-TriS, Pfizer, SUSP injection Inject into the muscle. 0.3 mL 0   diclofenac Sodium (VOLTAREN) 1 % GEL Apply 2 g topically daily as needed (pain). 150 g 3   diltiazem (CARDIZEM) 60 MG tablet Take 1 tablet (60 mg total) by mouth daily. 30 tablet 3   furosemide (LASIX) 20 MG tablet TAKE 2 TABLETS BY MOUTH EVERY DAY, MAY TAKE AN ADDITIONAL TABLET AS NEEDED FOR SWELLING. 270 tablet 2   Glycerin-Hypromellose-PEG 400 ([employer] DRY EYE RELIEF) 0.2-0.2-1 % SOLN Place 1 drop into both eyes daily as needed (Dry eye).     haloperidol (HALDOL) 1 MG tablet TAKE 1 TABLET(1 MG) BY MOUTH EVERY 8 HOURS AS NEEDED FOR AGITATION 60 tablet 1   NONFORMULARY OR COMPOUNDED ITEM rollator walker  #1  As directed  -------  Dx  CHF,  DOE,  OA 1 each 0   nystatin (MYCOSTATIN/NYSTOP) powder Apply 1 application topically 3 (three) times daily. 60 g 3   potassium chloride SA (KLOR-CON) 20 MEQ tablet Take 1 tablet (20 mEq total) by mouth daily. 90 tablet 3   sertraline (ZOLOFT) 50 MG tablet TAKE 1 TABLET(50 MG) BY MOUTH DAILY 90 tablet 0   spironolactone (ALDACTONE) 25 MG tablet Take 1 tablet (25 mg total) by mouth every other day. 45 tablet 3   sulfamethoxazole-trimethoprim (BACTRIM) 400-80 MG tablet Take 1 tablet by mouth 2 (two) times daily. 10 tablet 0   XARELTO 15 MG TABS tablet TAKE 1 TABLET(15 MG) BY MOUTH DAILY WITH BREAKFAST. DISCONTINUE ELIQUIS 90 tablet 0   No current facility-administered medications for this visit.    Allergies:   Patient has no known allergies.   Social History:  The patient  reports that she has never smoked. She has never used smokeless tobacco. She reports that she does not drink alcohol and does not use drugs.   Family History:  The patient's family history includes Arthritis in her mother; Coronary artery disease in an other family member; Diabetes in her brother; Heart disease in her mother; Heart disease (age of onset: 32) in her father.  ROS:  Please see the history of present illness.    All other systems are reviewed and otherwise negative.   PHYSICAL EXAM:  VS:  There were no vitals taken for this visit. BMI: There is no height or weight on file to calculate BMI. Well nourished, well developed, in no acute distress HEENT: normocephalic, atraumatic Neck: no JVD, carotid bruits or masses Cardiac:  irreg-irreg; no significant murmurs, no rubs, or gallops Lungs:  CTA b/l, no wheezing, rhonchi or rales Abd: soft, nontender MS: no deformity, age appropriate atrophy Ext: no edema Skin: warm and dry, no rash Neuro:  No gross deficits appreciated Psych: euthymic mood, full affect   EKG:  Not done today  02/05/21: TTE IMPRESSIONS   1. Left ventricular systolic  function is normal. EF 55-60%. No regional  wall motion abnormalities. There is mild focal hypertrophy of the basilar  septum. Diastolic function is indeterminate due to atrial fibrillation.  Findings suggestive of restrictive  cardiomyopathy.   2. Right ventricular systolic function is mildly reduced. The right  ventricular size is moderately enlarged. There is normal pulmonary artery  systolic pressure.   3. Left atrial size was severely dilated. (23mm)  4. Right atrial size was severely dilated.   5. The mitral valve is normal in structure. Mild mitral valve  regurgitation. No evidence of mitral stenosis.   6. Tricuspid valve regurgitation is severe.   7. The aortic valve is tricuspid. There is moderate calcification of the  aortic valve. Aortic valve regurgitation is not visualized. No aortic  stenosis is present.   8. The inferior vena cava is normal in size with greater than 50%  respiratory variability, suggesting right atrial pressure of 3 mmHg.    Recent Labs: 11/26/2020: Pro B Natriuretic peptide (BNP) 411.0 02/04/2021: B Natriuretic Peptide 305.9 02/05/2021: Magnesium 2.0 05/07/2021: ALT 9; BUN 28; Creatinine, Ser 1.88; Hemoglobin 14.2; Platelets 274.0; Potassium 4.7; Sodium 139; TSH 1.60  No results found for requested labs within last 8760 hours.   CrCl cannot be calculated (Patient's most recent lab result is older than the maximum 21 days allowed.).   Wt Readings from Last 3 Encounters:  05/07/21 149 lb 6.4 oz (67.8 kg)  04/23/21 149 lb (67.6 kg)  04/01/21 145 lb 9.6 oz (66 kg)     Other studies reviewed: Additional studies/records reviewed today include: summarized above  ASSESSMENT AND PLAN:  1. Permanent AFib     CHAD2DS2Vasc is 7, on  xarelto, appropriately dosed at 15mg  daily     LA described as severely dilated, measured 53mm     Rate a little elevated today at 100 with initial vitals, better after rest, appears controlled     Labs today  2. Chronic CHF  (diastolic, ?restrictive) 3. Mild RV failue, dilated RV 4. VHD, severe TR     Euvolemic today  Revisit the idea of pulmonary evaluation via her PMD Perhaps component of deconditioning Encourage to straighten up when seated and take nice deep breaths periodically  5. HTN    Looks ok      Disposition: they would like to  keep every 9mo check ins, will see her then, sooner if needed  Current medicines are reviewed at length with the patient today.  The patient did not have any concerns regarding medicines.  Norma Fredrickson, PA-C 07/21/2021 4:54 PM     [facility] [address] [place] [address] [phone] (office)  [phone] (fax)

## 2021-07-22 ENCOUNTER — Other Ambulatory Visit: Payer: Self-pay

## 2021-07-22 ENCOUNTER — Telehealth: Payer: Self-pay | Admitting: Family Medicine

## 2021-07-22 ENCOUNTER — Ambulatory Visit: Payer: Medicare Other | Admitting: Physician Assistant

## 2021-07-22 ENCOUNTER — Ambulatory Visit: Payer: Medicare Other | Admitting: Cardiology

## 2021-07-22 ENCOUNTER — Encounter: Payer: Self-pay | Admitting: Physician Assistant

## 2021-07-22 VITALS — BP 110/64 | HR 100 | Ht 64.0 in | Wt 147.4 lb

## 2021-07-22 DIAGNOSIS — I4821 Permanent atrial fibrillation: Secondary | ICD-10-CM

## 2021-07-22 DIAGNOSIS — I10 Essential (primary) hypertension: Secondary | ICD-10-CM

## 2021-07-22 DIAGNOSIS — Z79899 Other long term (current) drug therapy: Secondary | ICD-10-CM

## 2021-07-22 DIAGNOSIS — I5032 Chronic diastolic (congestive) heart failure: Secondary | ICD-10-CM | POA: Diagnosis not present

## 2021-07-22 LAB — BASIC METABOLIC PANEL
BUN/Creatinine Ratio: 15 (ref 12–28)
BUN: 25 mg/dL (ref 8–27)
CO2: 23 mmol/L (ref 20–29)
Calcium: 9.2 mg/dL (ref 8.7–10.3)
Chloride: 101 mmol/L (ref 96–106)
Creatinine, Ser: 1.72 mg/dL — ABNORMAL HIGH (ref 0.57–1.00)
Glucose: 85 mg/dL (ref 70–99)
Potassium: 3.8 mmol/L (ref 3.5–5.2)
Sodium: 141 mmol/L (ref 134–144)
eGFR: 28 mL/min/{1.73_m2} — ABNORMAL LOW (ref >59)

## 2021-07-22 LAB — CBC
Hematocrit: 44.5 % (ref 34.0–46.6)
Hemoglobin: 15.2 g/dL (ref 11.1–15.9)
MCH: 30.8 pg (ref 26.6–33.0)
MCHC: 34.2 g/dL (ref 31.5–35.7)
MCV: 90 fL (ref 79–97)
Platelets: 215 10*3/uL (ref 150–450)
RBC: 4.93 x10E6/uL (ref 3.77–5.28)
RDW: 15.2 % (ref 11.7–15.4)
WBC: 6.9 10*3/uL (ref 3.4–10.8)

## 2021-07-22 MED ORDER — POTASSIUM CHLORIDE CRYS ER 20 MEQ PO TBCR: 20.0000 meq | EXTENDED_RELEASE_TABLET | Freq: Every day | ORAL | 3 refills | Status: AC

## 2021-07-22 MED ORDER — FUROSEMIDE 20 MG PO TABS: 60.0000 mg | ORAL_TABLET | Freq: Every day | ORAL | 2 refills | Status: DC

## 2021-07-22 MED ORDER — RIVAROXABAN 15 MG PO TABS: ORAL_TABLET | ORAL | 2 refills | Status: AC

## 2021-07-22 MED ORDER — DILTIAZEM HCL 60 MG PO TABS: 60.0000 mg | ORAL_TABLET | Freq: Every day | ORAL | 3 refills | Status: AC

## 2021-07-22 MED ORDER — FUROSEMIDE 20 MG PO TABS: ORAL_TABLET | ORAL | 2 refills | Status: DC

## 2021-07-22 MED ORDER — SPIRONOLACTONE 25 MG PO TABS: 25.0000 mg | ORAL_TABLET | ORAL | 3 refills | Status: DC

## 2021-07-22 NOTE — Telephone Encounter (Signed)
Form faxed and received to be fixed  Placed in lownes bin up front

## 2021-07-22 NOTE — Patient Instructions (Signed)
Medication Instructions:   Your physician recommends that you continue on your current medications as directed. Please refer to the Current Medication list given to you today.  *If you need a refill on your cardiac medications before your next appointment, please call your pharmacy*   Lab Work: BMET AND CBC TODAY   If you have labs (blood work) drawn today and your tests are completely normal, you will receive your results only by: MyChart Message (if you have MyChart) OR A paper copy in the mail If you have any lab test that is abnormal or we need to change your treatment, we will call you to review the results.   Testing/Procedures: NONE ORDERED  TODAY    Follow-Up: At [facility], you and your health needs are our priority.  As part of our continuing mission to provide you with exceptional heart care, we have created designated Provider Care Teams.  These Care Teams include your primary Cardiologist (physician) and Advanced Practice Providers (APPs -  Physician Assistants and Nurse Practitioners) who all work together to provide you with the care you need, when you need it.  We recommend signing up for the patient portal called "MyChart".  Sign up information is provided on this After Visit Summary.  MyChart is used to connect with patients for Virtual Visits (Telemedicine).  Patients are able to view lab/test results, encounter notes, upcoming appointments, etc.  Non-urgent messages can be sent to your provider as well.   To learn more about what you can do with MyChart, go to [email or web address].    Your next appointment:   4 month(s)  The format for your next appointment:   In Person  Provider:   You will see one of the following Advanced Practice Providers on your designated Care Team:   Francis Dowse, [place]

## 2021-07-22 NOTE — Telephone Encounter (Signed)
Caller: denita hicks Call back [phone]  Stating forms that were faxed in(FL2) need to state that patient has dementia  for her to be placed in to [facility]

## 2021-07-23 ENCOUNTER — Encounter: Payer: Self-pay | Admitting: Family Medicine

## 2021-07-23 NOTE — Telephone Encounter (Signed)
Corrected form faxed.  

## 2021-07-26 ENCOUNTER — Ambulatory Visit: Payer: Medicare Other | Admitting: *Deleted

## 2021-07-26 DIAGNOSIS — R531 Weakness: Secondary | ICD-10-CM

## 2021-07-26 DIAGNOSIS — I10 Essential (primary) hypertension: Secondary | ICD-10-CM

## 2021-07-26 DIAGNOSIS — I509 Heart failure, unspecified: Secondary | ICD-10-CM

## 2021-07-26 DIAGNOSIS — I607 Nontraumatic subarachnoid hemorrhage from unspecified intracranial artery: Secondary | ICD-10-CM

## 2021-07-26 DIAGNOSIS — F419 Anxiety disorder, unspecified: Secondary | ICD-10-CM

## 2021-07-26 DIAGNOSIS — R413 Other amnesia: Secondary | ICD-10-CM

## 2021-07-26 DIAGNOSIS — R42 Dizziness and giddiness: Secondary | ICD-10-CM

## 2021-07-26 NOTE — Chronic Care Management (AMB) (Signed)
Chronic Care Management    Clinical Social Work Note  07/26/2021 Name: Belinda Anderson MRN: [id number] DOB: 1932/01/11  Belinda Anderson is a 87 y.o. year old female who is a primary care patient of [name], DO. The CCM team was consulted to assist the patient with chronic disease management and/or care coordination needs related to: Level of Care Concerns and Caregiver Stress.   Engaged with patient's daughter by telephone for follow-up visit in response to provider referral for social work chronic care management and care coordination services.   Consent to Services:  The patient was given information about Chronic Care Management services, agreed to services, and gave verbal consent prior to initiation of services.  Please see initial visit note for detailed documentation.   Patient agreed to services and consent obtained.   Assessment: Review of patient past medical history, allergies, medications, and health status, including review of relevant consultants reports was performed today as part of a comprehensive evaluation and provision of chronic care management and care coordination services.     SDOH (Social Determinants of Health) assessments and interventions performed:    Advanced Directives Status: Not addressed in this encounter.  CCM Care Plan  No Known Allergies  Outpatient Encounter Medications as of 07/26/2021  Medication Sig   acetaminophen (TYLENOL) 650 MG CR tablet Take 1,300 mg by mouth daily as needed for pain (arthritis).    COVID-19 mRNA Vac-TriS, Pfizer, SUSP injection Inject into the muscle.   diclofenac Sodium (VOLTAREN) 1 % GEL Apply 2 g topically daily as needed (pain).   diltiazem (CARDIZEM) 60 MG tablet Take 1 tablet (60 mg total) by mouth daily.   furosemide (LASIX) 20 MG tablet Take 3 tablets (60 mg total) by mouth daily. MAY TAKE AN ADDITIONAL TABLET AS NEEDED FOR SWELLING.   Glycerin-Hypromellose-PEG 400 ([employer] DRY EYE RELIEF) 0.2-0.2-1 %  SOLN Place 1 drop into both eyes daily as needed (Dry eye).   haloperidol (HALDOL) 1 MG tablet TAKE 1 TABLET(1 MG) BY MOUTH EVERY 8 HOURS AS NEEDED FOR AGITATION   levothyroxine (SYNTHROID) 75 MCG tablet TAKE 1 TABLET(75 MCG) BY MOUTH DAILY   NONFORMULARY OR COMPOUNDED ITEM rollator walker  #1  As directed -------  Dx  CHF,  DOE,  OA   nystatin (MYCOSTATIN/NYSTOP) powder Apply 1 application topically 3 (three) times daily.   potassium chloride SA (KLOR-CON) 20 MEQ tablet Take 1 tablet (20 mEq total) by mouth daily.   Rivaroxaban (XARELTO) 15 MG TABS tablet TAKE 1 TABLET(15 MG) BY MOUTH DAILY WITH BREAKFAST. DISCONTINUE ELIQUIS   sertraline (ZOLOFT) 50 MG tablet TAKE 1 TABLET(50 MG) BY MOUTH DAILY   spironolactone (ALDACTONE) 25 MG tablet Take 1 tablet (25 mg total) by mouth every other day.   sulfamethoxazole-trimethoprim (BACTRIM) 400-80 MG tablet Take 1 tablet by mouth 2 (two) times daily.   No facility-administered encounter medications on file as of 07/26/2021.    Patient Active Problem List   Diagnosis Date Noted   Pressure ulcer of sacral region, stage 1 04/23/2021   Intertrigo 04/23/2021   Abnormal urine odor 04/23/2021   Left foot pain 02/10/2021   Weakness 02/10/2021   Urinary tract infection without hematuria 02/10/2021   Cellulitis 02/10/2021   Dementia (HCC) 02/10/2021   Gout 02/10/2021   SOB (shortness of breath) 02/05/2021   CKD (chronic kidney disease), stage III (HCC) 02/05/2021   Anxiety 12/24/2020   Impacted cerumen of right ear 12/24/2020   Acute on chronic diastolic CHF (congestive heart failure) (HCC)  11/26/2020   Bilateral leg edema 01/30/2020   COVID-19 virus infection 11/21/2019   Cough 11/21/2019   External hemorrhoid, bleeding 10/12/2019   AF (paroxysmal atrial fibrillation) (HCC) 10/02/2019   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 09/30/2019   Middle cerebral artery embolism, right 09/30/2019   Contusion of foot  including toes, right, initial encounter 08/10/2018   Primary osteoarthritis of right knee 07/31/2018   Right leg swelling 07/17/2018   Pain of right calf 07/17/2018   Preventative health care 09/18/2017   Low back pain radiating to left leg 11/28/2016   Hyperlipidemia 02/02/2015   Sciatica 07/16/2014   Knee pain, acute 07/16/2014   Lower extremity pain, right 07/16/2014   Skin infection 07/16/2014   Incontinence of urine in female 06/19/2013   Cerebral aneurysm rupture (HCC) 04/08/2013   LEG EDEMA, RIGHT 07/06/2010   OTHER DRUG ALLERGY 07/06/2010   DIARRHEA 02/12/2010   HEMATURIA, HX OF 11/24/2009   ANEURYSM, HX OF 08/03/2009   B12 deficiency 07/24/2009   ACQUIRED HEMOLYTIC ANEMIA UNSPECIFIED 07/24/2009   SYNCOPE 07/20/2009   DIZZINESS 07/20/2009   Memory loss 07/20/2009   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  Limited Social Support, Level of Care Concerns, Medication Procurement, ADL/IADL Limitations, Social Isolation, Limited Access to Caregiver, Cognitive Deficits, Memory Deficits, and Lacks Knowledge of [employer].  Care Plan : LCSW Plan of Care  Updates made by Karolee Stamps, LCSW since 07/26/2021 12:00 AM     Problem: Maintain My Quality of Life through Placement into a Higher Level of Care.   Priority: High     Goal: Maintain My Quality of Life through Placement into a Higher Level of Care.   Start Date: 07/08/2021  Expected End Date: 09/07/2021  This Visit's Progress: On track  Recent Progress: On track  Priority: High  Note:   Current Barriers:   Patient with Alzheimer's Dementia without Behavioral Disturbance, Unspecified Timing of Dementia Onset, Congestive Heart Failure,  Unspecified HF Chronicity, Unspecified Heart Failure Type, Dizziness, Shortness of Breath, Memory Loss, Primary Osteoarthritis of Right Knee, Cerebral Aneurysm Rupture and Anxiety needs Support, Education, Referrals, Resources and Care Coordination to resolve unmet personal care needs in the home through placement into a higher level of care. Clinical Goal(s):  Patient's daughter will work with LCSW to obtain placement for patient into a long-term memory care assisted living facility.   LCSW Interventions:  Collaboration with Primary Care Physician, Dr. Seabron Spates, regarding development and update of comprehensive plan of care as evidenced by provider attestation and co-signature. Patient Goals/Self-Care Activities: LCSW collaboration with daughter, [name] to ensure that transition from home to [facility] [place] was a success and that you were able to move in on 07/22/2021, as scheduled. LCSW collaboration with Primary Care Physician, Dr. Seabron Spates to confirm that all appropriate paperwork has been submitted to [facility], as well as [employer] [facility] Policy), for processing and payment reimbursement. Contact LCSW directly (# [phone]) if you have questions, need assistance, or if additional social work needs are identified between now and our next scheduled telephone outreach call. Follow-Up Plan:  LCSW will make contact with [name] on 07/28/2021 at 3:00pm     Danford Bad LCSW Licensed Clinical Social Worker [facility] [phone]

## 2021-07-26 NOTE — Patient Instructions (Signed)
Visit Information  PATIENT GOALS:  Goals Addressed             This Visit's Progress    Maintain My Quality of Life through Placement into a Higher Level of Care.   On track    Timeframe:  Short-Term Goal Priority:  High Start Date:  07/08/2021                        Expected End Date:  09/07/2021              Follow-Up Date:  07/28/2021 at 3:00pm  Patient Goals/Self-Care Activities:  LCSW collaboration with daughter, [name] to ensure that transition from home to [facility] in [place] was a success and that you were able to move in on 07/22/2021, as scheduled. LCSW collaboration with Primary Care Physician, Dr. Seabron Spates to confirm that all appropriate paperwork has been submitted to [facility], as well as [employer] [facility] Policy), for processing and payment reimbursement. Contact LCSW directly (# [phone]) if you have questions, need assistance, or if additional social work needs are identified between now and our next scheduled telephone outreach call.        Patient verbalizes understanding of instructions provided today and agrees to view in MyChart.   Telephone follow up appointment with care management team member scheduled for:  07/28/2021 at 3:00pm  Danford Bad LCSW Licensed Clinical Social Worker [facility] [phone]

## 2021-07-28 ENCOUNTER — Ambulatory Visit: Payer: Medicare Other | Admitting: *Deleted

## 2021-07-28 DIAGNOSIS — I10 Essential (primary) hypertension: Secondary | ICD-10-CM

## 2021-07-28 DIAGNOSIS — R413 Other amnesia: Secondary | ICD-10-CM

## 2021-07-28 DIAGNOSIS — R531 Weakness: Secondary | ICD-10-CM

## 2021-07-28 DIAGNOSIS — F419 Anxiety disorder, unspecified: Secondary | ICD-10-CM

## 2021-07-28 DIAGNOSIS — I607 Nontraumatic subarachnoid hemorrhage from unspecified intracranial artery: Secondary | ICD-10-CM

## 2021-07-28 DIAGNOSIS — I509 Heart failure, unspecified: Secondary | ICD-10-CM

## 2021-07-28 DIAGNOSIS — M1711 Unilateral primary osteoarthritis, right knee: Secondary | ICD-10-CM

## 2021-07-28 DIAGNOSIS — R42 Dizziness and giddiness: Secondary | ICD-10-CM

## 2021-07-28 NOTE — Patient Instructions (Signed)
Visit Information  PATIENT GOALS:  Goals Addressed             This Visit's Progress    COMPLETED: Maintain My Quality of Life through Placement into a Higher Level of Care.   On track    Timeframe:  Short-Term Goal Priority:  High Start Date:  07/08/2021                        Expected End Date:  07/28/2021            Follow-Up Date:  No Follow-Up Required.  Patient Goals/Self-Care Activities:  LCSW collaboration with daughter, [name] to ensure secure placement at [facility] in [facility], for long-term care services.    Contact LCSW directly (# [phone]) if you have questions, need assistance, or if additional social work needs are identified in the near future.          Patient verbalizes understanding of instructions provided today and agrees to view in MyChart.   No Follow-Up Required.  Danford Bad LCSW Licensed Clinical Social Worker [facility] [employer] [phone]

## 2021-07-28 NOTE — Chronic Care Management (AMB) (Signed)
Chronic Care Management    Clinical Social Work Note  07/28/2021 Name: Norah Devin MRN: [id number] DOB: 08-18-1932  Herberta Pickron is a 87 y.o. year old female who is a primary care patient of [name], DO. The CCM team was consulted to assist the patient with chronic disease management and/or care coordination needs related to: Level of Care Concerns.   Engaged with patient's daughter by telephone for follow-up visit in response to provider referral for social work chronic care management and care coordination services.   Consent to Services:  The patient was given information about Chronic Care Management services, agreed to services, and gave verbal consent prior to initiation of services.  Please see initial visit note for detailed documentation.   Patient agreed to services and consent obtained.   Assessment: Review of patient past medical history, allergies, medications, and health status, including review of relevant consultants reports was performed today as part of a comprehensive evaluation and provision of chronic care management and care coordination services.     SDOH (Social Determinants of Health) assessments and interventions performed:    Advanced Directives Status: Not addressed in this encounter.  CCM Care Plan  No Known Allergies  Outpatient Encounter Medications as of 07/28/2021  Medication Sig   acetaminophen (TYLENOL) 650 MG CR tablet Take 1,300 mg by mouth daily as needed for pain (arthritis).    COVID-19 mRNA Vac-TriS, Pfizer, SUSP injection Inject into the muscle.   diclofenac Sodium (VOLTAREN) 1 % GEL Apply 2 g topically daily as needed (pain).   diltiazem (CARDIZEM) 60 MG tablet Take 1 tablet (60 mg total) by mouth daily.   furosemide (LASIX) 20 MG tablet Take 3 tablets (60 mg total) by mouth daily. MAY TAKE AN ADDITIONAL TABLET AS NEEDED FOR SWELLING.   Glycerin-Hypromellose-PEG 400 ([employer] DRY EYE RELIEF) 0.2-0.2-1 % SOLN Place 1 drop into  both eyes daily as needed (Dry eye).   haloperidol (HALDOL) 1 MG tablet TAKE 1 TABLET(1 MG) BY MOUTH EVERY 8 HOURS AS NEEDED FOR AGITATION   levothyroxine (SYNTHROID) 75 MCG tablet TAKE 1 TABLET(75 MCG) BY MOUTH DAILY   NONFORMULARY OR COMPOUNDED ITEM rollator walker  #1  As directed -------  Dx  CHF,  DOE,  OA   nystatin (MYCOSTATIN/NYSTOP) powder Apply 1 application topically 3 (three) times daily.   potassium chloride SA (KLOR-CON) 20 MEQ tablet Take 1 tablet (20 mEq total) by mouth daily.   Rivaroxaban (XARELTO) 15 MG TABS tablet TAKE 1 TABLET(15 MG) BY MOUTH DAILY WITH BREAKFAST. DISCONTINUE ELIQUIS   sertraline (ZOLOFT) 50 MG tablet TAKE 1 TABLET(50 MG) BY MOUTH DAILY   spironolactone (ALDACTONE) 25 MG tablet Take 1 tablet (25 mg total) by mouth every other day.   sulfamethoxazole-trimethoprim (BACTRIM) 400-80 MG tablet Take 1 tablet by mouth 2 (two) times daily.   No facility-administered encounter medications on file as of 07/28/2021.    Patient Active Problem List   Diagnosis Date Noted   Pressure ulcer of sacral region, stage 1 04/23/2021   Intertrigo 04/23/2021   Abnormal urine odor 04/23/2021   Left foot pain 02/10/2021   Weakness 02/10/2021   Urinary tract infection without hematuria 02/10/2021   Cellulitis 02/10/2021   Dementia (HCC) 02/10/2021   Gout 02/10/2021   SOB (shortness of breath) 02/05/2021   CKD (chronic kidney disease), stage III (HCC) 02/05/2021   Anxiety 12/24/2020   Impacted cerumen of right ear 12/24/2020   Acute on chronic diastolic CHF (congestive heart failure) (HCC) 11/26/2020  Bilateral leg edema 01/30/2020   COVID-19 virus infection 11/21/2019   Cough 11/21/2019   External hemorrhoid, bleeding 10/12/2019   AF (paroxysmal atrial fibrillation) (HCC) 10/02/2019   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 09/30/2019   Middle cerebral artery embolism, right 09/30/2019   Contusion of foot including toes, right,  initial encounter 08/10/2018   Primary osteoarthritis of right knee 07/31/2018   Right leg swelling 07/17/2018   Pain of right calf 07/17/2018   Preventative health care 09/18/2017   Low back pain radiating to left leg 11/28/2016   Hyperlipidemia 02/02/2015   Sciatica 07/16/2014   Knee pain, acute 07/16/2014   Lower extremity pain, right 07/16/2014   Skin infection 07/16/2014   Incontinence of urine in female 06/19/2013   Cerebral aneurysm rupture (HCC) 04/08/2013   LEG EDEMA, RIGHT 07/06/2010   OTHER DRUG ALLERGY 07/06/2010   DIARRHEA 02/12/2010   HEMATURIA, HX OF 11/24/2009   ANEURYSM, HX OF 08/03/2009   B12 deficiency 07/24/2009   ACQUIRED HEMOLYTIC ANEMIA UNSPECIFIED 07/24/2009   SYNCOPE 07/20/2009   DIZZINESS 07/20/2009   Memory loss 07/20/2009   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  Level of Care Concerns.  Care Plan : LCSW Plan of Care  Updates made by Karolee Stamps, LCSW since 07/28/2021 12:00 AM     Problem: Maintain My Quality of Life through Placement into a Higher Level of Care. Resolved 07/28/2021  Priority: High     Goal: Maintain My Quality of Life through Placement into a Higher Level of Care. Completed 07/28/2021  Start Date: 07/08/2021  Expected End Date: 07/28/2021  This Visit's Progress: On track  Recent Progress: On track  Priority: High  Note:   Current Barriers:   Patient with Alzheimer's Dementia without Behavioral Disturbance, Unspecified Timing of Dementia Onset, Congestive Heart Failure, Unspecified HF Chronicity, Unspecified Heart Failure Type, Dizziness, Shortness of Breath, Memory Loss, Primary Osteoarthritis of Right Knee, Cerebral Aneurysm Rupture and Anxiety  needs Support, Education, Referrals, Resources and Care Coordination to resolve unmet personal care needs in the home through placement into a higher level of care. Clinical Goal(s):  Patient's daughter will work with LCSW to obtain placement for patient into a long-term memory care assisted living facility.   LCSW Interventions:  Collaboration with Primary Care Physician, Dr. Seabron Spates, regarding development and update of comprehensive plan of care as evidenced by provider attestation and co-signature. Patient Goals/Self-Care Activities: LCSW collaboration with daughter, [name] to ensure secure placement at [facility] in [facility], for long-term care services.    Contact LCSW directly (# [phone]) if you have questions, need assistance, or if additional social work needs are identified in the near future.   Follow-Up Plan:  No Follow-Up Required.     Danford Bad LCSW Licensed Clinical Social Worker [facility] [employer] [phone]

## 2021-08-03 ENCOUNTER — Ambulatory Visit: Payer: Medicare Other

## 2021-08-03 DIAGNOSIS — I10 Essential (primary) hypertension: Secondary | ICD-10-CM

## 2021-08-03 DIAGNOSIS — R413 Other amnesia: Secondary | ICD-10-CM

## 2021-08-03 DIAGNOSIS — E785 Hyperlipidemia, unspecified: Secondary | ICD-10-CM

## 2021-08-03 NOTE — Chronic Care Management (AMB) (Signed)
Chronic Care Management   CCM RN Visit Note  08/03/2021 Name: Belinda Anderson MRN: [id number] DOB: September 05, 1932  Subjective: Belinda Anderson is a 87 y.o. year old female who is a primary care patient of Belinda Schultz, DO. The care management team was consulted for assistance with disease management and care coordination needs.    Engaged with patient by telephone for follow up visit in response to provider referral for case management and/or care coordination services.   Consent to Services:  The patient was given information about Chronic Care Management services, agreed to services, and gave verbal consent prior to initiation of services.  Please see initial visit note for detailed documentation.   Patient agreed to services and verbal consent obtained.   Assessment: Review of patient past medical history, allergies, medications, health status, including review of consultants reports, laboratory and other test data, was performed as part of comprehensive evaluation and provision of chronic care management services.   SDOH (Social Determinants of Health) assessments and interventions performed:    CCM Care Plan  No Known Allergies  Outpatient Encounter Medications as of 08/03/2021  Medication Sig   acetaminophen (TYLENOL) 650 MG CR tablet Take 1,300 mg by mouth daily as needed for pain (arthritis).    COVID-19 mRNA Vac-TriS, Pfizer, SUSP injection Inject into the muscle.   diclofenac Sodium (VOLTAREN) 1 % GEL Apply 2 g topically daily as needed (pain).   diltiazem (CARDIZEM) 60 MG tablet Take 1 tablet (60 mg total) by mouth daily.   furosemide (LASIX) 20 MG tablet Take 3 tablets (60 mg total) by mouth daily. MAY TAKE AN ADDITIONAL TABLET AS NEEDED FOR SWELLING.   Glycerin-Hypromellose-PEG 400 ([employer] DRY EYE RELIEF) 0.2-0.2-1 % SOLN Place 1 drop into both eyes daily as needed (Dry eye).   haloperidol (HALDOL) 1 MG tablet TAKE 1 TABLET(1 MG) BY MOUTH EVERY 8 HOURS AS NEEDED FOR  AGITATION   levothyroxine (SYNTHROID) 75 MCG tablet TAKE 1 TABLET(75 MCG) BY MOUTH DAILY   NONFORMULARY OR COMPOUNDED ITEM rollator walker  #1  As directed -------  Dx  CHF,  DOE,  OA   nystatin (MYCOSTATIN/NYSTOP) powder Apply 1 application topically 3 (three) times daily.   potassium chloride SA (KLOR-CON) 20 MEQ tablet Take 1 tablet (20 mEq total) by mouth daily.   Rivaroxaban (XARELTO) 15 MG TABS tablet TAKE 1 TABLET(15 MG) BY MOUTH DAILY WITH BREAKFAST. DISCONTINUE ELIQUIS   sertraline (ZOLOFT) 50 MG tablet TAKE 1 TABLET(50 MG) BY MOUTH DAILY   spironolactone (ALDACTONE) 25 MG tablet Take 1 tablet (25 mg total) by mouth every other day.   sulfamethoxazole-trimethoprim (BACTRIM) 400-80 MG tablet Take 1 tablet by mouth 2 (two) times daily.   No facility-administered encounter medications on file as of 08/03/2021.    Patient Active Problem List   Diagnosis Date Noted   Pressure ulcer of sacral region, stage 1 04/23/2021   Intertrigo 04/23/2021   Abnormal urine odor 04/23/2021   Left foot pain 02/10/2021   Weakness 02/10/2021   Urinary tract infection without hematuria 02/10/2021   Cellulitis 02/10/2021   Dementia (HCC) 02/10/2021   Gout 02/10/2021   SOB (shortness of breath) 02/05/2021   CKD (chronic kidney disease), stage III (HCC) 02/05/2021   Anxiety 12/24/2020   Impacted cerumen of right ear 12/24/2020   Acute on chronic diastolic CHF (congestive heart failure) (HCC) 11/26/2020   Bilateral leg edema 01/30/2020   COVID-19 virus infection 11/21/2019   Cough 11/21/2019   External hemorrhoid, bleeding 10/12/2019  AF (paroxysmal atrial fibrillation) (HCC) 10/02/2019   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 09/30/2019   Middle cerebral artery embolism, right 09/30/2019   Contusion of foot including toes, right, initial encounter 08/10/2018   Primary osteoarthritis of right knee 07/31/2018   Right leg swelling 07/17/2018   Pain of right  calf 07/17/2018   Preventative health care 09/18/2017   Low back pain radiating to left leg 11/28/2016   Hyperlipidemia 02/02/2015   Sciatica 07/16/2014   Knee pain, acute 07/16/2014   Lower extremity pain, right 07/16/2014   Skin infection 07/16/2014   Incontinence of urine in female 06/19/2013   Cerebral aneurysm rupture (HCC) 04/08/2013   LEG EDEMA, RIGHT 07/06/2010   OTHER DRUG ALLERGY 07/06/2010   DIARRHEA 02/12/2010   HEMATURIA, HX OF 11/24/2009   ANEURYSM, HX OF 08/03/2009   B12 deficiency 07/24/2009   ACQUIRED HEMOLYTIC ANEMIA UNSPECIFIED 07/24/2009   SYNCOPE 07/20/2009   DIZZINESS 07/20/2009   Memory loss 07/20/2009   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   Hypothyroidism 12/14/2007   Essential hypertension 12/14/2007   DIVERTICULITIS, HX OF 12/14/2007   GOITER 01/30/2007   HYPERTENSION, BENIGN 01/30/2007   UTERINE POLYP 01/30/2007   THYROIDECTOMY, HX OF 01/30/2007    Conditions to be addressed/monitored:CHF, HTN, Dementia, and Level of Care  Care Plan : Cardiovascular Disease  Updates made by Colletta Maryland, RN since 08/03/2021 12:00 AM  Completed 08/03/2021   Problem: Symptom managment of cardivascular disease Resolved 08/03/2021  Priority: Medium     Long-Range Goal: Symptom Exacerbation Prevented or Minimized Completed 08/03/2021  Start Date: 01/07/2021  Expected End Date: 09/24/2021  Recent Progress: On track  Priority: Medium  Note:   Current Barriers:  Knowledge deficit related to long term care plan management of cardiovascular disease. Admission 02/04/21-02/08/21 with acute on chronic CHF, A-fib, HTN, UTI, CKD stage III, dementia. RNCM spoke with Belinda Anderson, daughter/DPR. Reports patient is residing at [facility] [place] for long term care services. Although patient and family are still adjusting to the transition, Belinda Anderson reports positive feedback regarding patient's progress-patient is beginning to  attend activities, she has made a friend and is starting physical therapy sessions to improve balance. Per Belinda Anderson, patient will continue to see PCP for follow up care.  Case Manager Clinical Goal(s):  Patient/caregiver will verbalize understanding of management of cardiovascular disease and when to call doctor Patient will take medications as prescribed. Patient/caregiver will have appropriate community resources/support Interventions:  Collaboration with [name], Grayling Congress, DO regarding development and update of comprehensive plan of care as evidenced by provider attestation and co-signature Inter-disciplinary care team collaboration (see longitudinal plan of care) Discussed patient transition to facility Patient Goals: -transition to [facility] Follow Up Plan: no further follow up indicated.    Care Plan : Anxiety/Quality of Life  Updates made by Colletta Maryland, RN since 08/03/2021 12:00 AM  Completed 08/03/2021   Problem: Quality of life affected by anxiety in patient with Chronic condition: Heart failure, HTN, atrial fibrillation,memory loss Resolved 08/03/2021  Priority: High     Long-Range Goal: Quality of Life Maintained Completed 08/03/2021  Start Date: 04/22/2021  Expected End Date: 09/22/2021  Recent Progress: On track  Priority: High  Note:   Current Barriers: Long term care plan for management of Quality of Life in a patient with chronic CHF, A-fib, HTN, UTI, CKD stage III, dementia, anxiety. Patient is residing  at [facility] [place] for long term care services. Although patient and family are still adjusting to the transition, Belinda Anderson reports positive feedback regarding patient's progress-patient is beginning to attend activities, she has made a friend and is starting physical therapy sessions to improve balance. Per Belinda Anderson, patient will continue to see PCP for follow up care.   Ineffective Self Health Maintenance  Clinical Goal(s):  Collaboration with [name], Grayling Congress, DO regarding development and update of comprehensive plan of care as evidenced by provider attestation and co-signature Inter-disciplinary care team collaboration (see longitudinal plan of care) Patient/caregiver will work with care management team to address care coordination and chronic disease management needs related to Disease Management, Educational Needs, Care Coordination, Dementia and Caregiver Support, Level of Care Concerns   Interventions:  1:1 collaboration with [name], Grayling Congress, DO regarding development and update of comprehensive plan of care as evidenced by provider attestation and co-signature Inter-disciplinary care team collaboration (see longitudinal plan of care) Discussed patient's transition to [facility]  Patient Goals/Self-Care Activities Transition to [facility] unit Follow Up Plan: No follow up required.   Plan:No further follow up required.  Kathyrn Sheriff, RN, MSN, BSN, CCM Care Management Coordinator [facility] [phone]

## 2021-08-03 NOTE — Patient Instructions (Signed)
Visit Information  PATIENT GOALS:  Goals Addressed             This Visit's Progress    COMPLETED: Quality of Life Maintained/Improved       Timeframe:  Long-Range Goal Priority:  High Start Date:   01/07/21                          Expected End Date:   09/21/21                     Patient Goals: Transitioned to [facility].     COMPLETED: Track and Manage cardiac signs/symptoms       Timeframe:  Long-Range Goal Priority:  Medium Start Date:  01/07/21                         Expected End Date:  09/21/21                       Patient Goals: Transitioned to [facility]        Patient verbalizes understanding of instructions provided today and agrees to view in MyChart.   No further follow up required.  Kathyrn Sheriff, RN, MSN, BSN, CCM Care Management Coordinator [facility] [phone]

## 2021-08-05 ENCOUNTER — Other Ambulatory Visit: Payer: Self-pay | Admitting: Family Medicine

## 2021-08-09 ENCOUNTER — Other Ambulatory Visit: Payer: Self-pay | Admitting: Family Medicine

## 2021-08-16 DIAGNOSIS — E785 Hyperlipidemia, unspecified: Secondary | ICD-10-CM

## 2021-08-16 DIAGNOSIS — I607 Nontraumatic subarachnoid hemorrhage from unspecified intracranial artery: Secondary | ICD-10-CM

## 2021-08-16 DIAGNOSIS — I509 Heart failure, unspecified: Secondary | ICD-10-CM

## 2021-08-16 DIAGNOSIS — M1711 Unilateral primary osteoarthritis, right knee: Secondary | ICD-10-CM | POA: Diagnosis not present

## 2021-08-16 DIAGNOSIS — I10 Essential (primary) hypertension: Secondary | ICD-10-CM | POA: Diagnosis not present

## 2021-08-21 ENCOUNTER — Emergency Department (HOSPITAL_BASED_OUTPATIENT_CLINIC_OR_DEPARTMENT_OTHER)
Admission: EM | Admit: 2021-08-21 | Discharge: 2021-08-21 | Disposition: A | Discharge status: Home / Self Care | Payer: Medicare Other | Attending: Emergency Medicine | Admitting: Emergency Medicine

## 2021-08-21 ENCOUNTER — Encounter (HOSPITAL_BASED_OUTPATIENT_CLINIC_OR_DEPARTMENT_OTHER): Payer: Self-pay | Admitting: *Deleted

## 2021-08-21 ENCOUNTER — Emergency Department (HOSPITAL_BASED_OUTPATIENT_CLINIC_OR_DEPARTMENT_OTHER): Payer: Medicare Other

## 2021-08-21 DIAGNOSIS — E039 Hypothyroidism, unspecified: Secondary | ICD-10-CM | POA: Diagnosis not present

## 2021-08-21 DIAGNOSIS — Z79899 Other long term (current) drug therapy: Secondary | ICD-10-CM | POA: Diagnosis not present

## 2021-08-21 DIAGNOSIS — N183 Chronic kidney disease, stage 3 unspecified: Secondary | ICD-10-CM | POA: Diagnosis not present

## 2021-08-21 DIAGNOSIS — S01311A Laceration without foreign body of right ear, initial encounter: Secondary | ICD-10-CM | POA: Insufficient documentation

## 2021-08-21 DIAGNOSIS — R600 Localized edema: Secondary | ICD-10-CM | POA: Diagnosis not present

## 2021-08-21 DIAGNOSIS — R519 Headache, unspecified: Secondary | ICD-10-CM | POA: Insufficient documentation

## 2021-08-21 DIAGNOSIS — X58XXXA Exposure to other specified factors, initial encounter: Secondary | ICD-10-CM | POA: Insufficient documentation

## 2021-08-21 DIAGNOSIS — I129 Hypertensive chronic kidney disease with stage 1 through stage 4 chronic kidney disease, or unspecified chronic kidney disease: Secondary | ICD-10-CM | POA: Insufficient documentation

## 2021-08-21 DIAGNOSIS — Z8616 Personal history of COVID-19: Secondary | ICD-10-CM | POA: Diagnosis not present

## 2021-08-21 DIAGNOSIS — W19XXXA Unspecified fall, initial encounter: Secondary | ICD-10-CM

## 2021-08-21 DIAGNOSIS — S0991XA Unspecified injury of ear, initial encounter: Secondary | ICD-10-CM | POA: Diagnosis present

## 2021-08-21 DIAGNOSIS — R1030 Lower abdominal pain, unspecified: Secondary | ICD-10-CM | POA: Diagnosis not present

## 2021-08-21 DIAGNOSIS — I4891 Unspecified atrial fibrillation: Secondary | ICD-10-CM | POA: Insufficient documentation

## 2021-08-21 DIAGNOSIS — M79641 Pain in right hand: Secondary | ICD-10-CM | POA: Insufficient documentation

## 2021-08-21 LAB — CBC WITH DIFFERENTIAL/PLATELET
Abs Immature Granulocytes: 0.03 10*3/uL (ref 0.00–0.07)
Basophils Absolute: 0 10*3/uL (ref 0.0–0.1)
Basophils Relative: 0 %
Eosinophils Absolute: 0.1 10*3/uL (ref 0.0–0.5)
Eosinophils Relative: 1 %
HCT: 43.9 % (ref 36.0–46.0)
Hemoglobin: 14.5 g/dL (ref 12.0–15.0)
Immature Granulocytes: 0 %
Lymphocytes Relative: 18 %
Lymphs Abs: 1.3 10*3/uL (ref 0.7–4.0)
MCH: 32.1 pg (ref 26.0–34.0)
MCHC: 33 g/dL (ref 30.0–36.0)
MCV: 97.1 fL (ref 80.0–100.0)
Monocytes Absolute: 0.7 10*3/uL (ref 0.1–1.0)
Monocytes Relative: 9 %
Neutro Abs: 5.2 10*3/uL (ref 1.7–7.7)
Neutrophils Relative %: 72 %
Platelets: 212 10*3/uL (ref 150–400)
RBC: 4.52 MIL/uL (ref 3.87–5.11)
RDW: 15.9 % — ABNORMAL HIGH (ref 11.5–15.5)
WBC: 7.3 10*3/uL (ref 4.0–10.5)
nRBC: 0 % (ref 0.0–0.2)

## 2021-08-21 LAB — URINALYSIS, ROUTINE W REFLEX MICROSCOPIC
Bilirubin Urine: NEGATIVE
Glucose, UA: NEGATIVE mg/dL
Hgb urine dipstick: NEGATIVE
Ketones, ur: NEGATIVE mg/dL
Leukocytes,Ua: NEGATIVE
Nitrite: POSITIVE — AB
Protein, ur: NEGATIVE mg/dL
Specific Gravity, Urine: 1.02 (ref 1.005–1.030)
pH: 5.5 (ref 5.0–8.0)

## 2021-08-21 LAB — COMPREHENSIVE METABOLIC PANEL
ALT: 11 U/L (ref 0–44)
AST: 20 U/L (ref 15–41)
Albumin: 3.8 g/dL (ref 3.5–5.0)
Alkaline Phosphatase: 116 U/L (ref 38–126)
Anion gap: 8 (ref 5–15)
BUN: 23 mg/dL (ref 8–23)
CO2: 27 mmol/L (ref 22–32)
Calcium: 8.8 mg/dL — ABNORMAL LOW (ref 8.9–10.3)
Chloride: 103 mmol/L (ref 98–111)
Creatinine, Ser: 1.64 mg/dL — ABNORMAL HIGH (ref 0.44–1.00)
GFR, Estimated: 30 mL/min — ABNORMAL LOW (ref >60)
Glucose, Bld: 98 mg/dL (ref 70–99)
Potassium: 3.9 mmol/L (ref 3.5–5.1)
Sodium: 138 mmol/L (ref 135–145)
Total Bilirubin: 0.5 mg/dL (ref 0.3–1.2)
Total Protein: 6.8 g/dL (ref 6.5–8.1)

## 2021-08-21 LAB — URINALYSIS, MICROSCOPIC (REFLEX)

## 2021-08-21 MED ORDER — CEPHALEXIN 500 MG PO CAPS: 500.0000 mg | ORAL_CAPSULE | Freq: Four times a day (QID) | ORAL | 0 refills | Status: DC

## 2021-08-21 NOTE — ED Triage Notes (Signed)
Had fall today at Assisted Living Facility, Head rt ear, rt side of head on a bedside table, some bleeding noted from rt ear, bleeding is controlled. Rt Hand appears swollen as well. Rt ankle is swollen as well. Family states ankle swelling is normal for her, but rt hand swelling is new since last Sunday. No LOC, mechanical fall due to weakness she has been having. All per family statement

## 2021-08-21 NOTE — Discharge Instructions (Addendum)
Your mother seen in the emergency department today after a fall.  As we discussed the CT imaging of her head did not show any bleeds or any fractures.  We did find a urinary tract infection on her labs, and we will treat this with antibiotics and I have sent this prescription to her pharmacy for you to pick up tomorrow.  The imaging of her hand also showed no acute fractures or dislocation.  I do not think that this is likely gout, does not follow that symptom pattern.  However I would like to switch her from the tramadol that she is currently on to Tylenol.  Tramadol in a patient of this age can have significant adverse effects.  It was a pleasure seeing and taking care of her today, and I hope she starts to feel better soon!

## 2021-08-21 NOTE — ED Notes (Signed)
High Fall Risk Client, sr x 2 up, fall risk bracelet on, bed in lowest position, family at bedside

## 2021-08-21 NOTE — ED Provider Notes (Signed)
[facility] EMERGENCY DEPARTMENT Provider Note   CSN: [id number] Arrival date & time: 08/21/21  1311     History Chief Complaint  Patient presents with   Marletta Lor    Belinda Anderson is a 87 y.o. female with history of atrial fibrillation on rivaroxaban, stage III CKD, dementia, and multiple brain aneurysms who presents to the emergency department today after a fall.  Patient lives in a memory care unit at an assisted living facility, and there appears to have been a unwitnessed fall earlier today.  Reported patient struck her right ear and the right side of her head on a bedside table, and there was some bleeding noted from her right ear.  There was no loss of consciousness.  Family reports that her right hand also appears swollen, and she was complaining of this for the past 3 days.  She had a televisit in her assisted living facility, and she was placed on tramadol at that time.  Family states that she has right ankle swelling, but also states this is normal for her.  Level 5 caveat due to dementia   Fall      Past Medical History:  Diagnosis Date   Arthritis    Atrial fibrillation (HCC)    Hypertension    Thyroid disease    TIA (transient ischemic attack)     Patient Active Problem List   Diagnosis Date Noted   Pressure ulcer of sacral region, stage 1 04/23/2021   Intertrigo 04/23/2021   Abnormal urine odor 04/23/2021   Left foot pain 02/10/2021   Weakness 02/10/2021   Urinary tract infection without hematuria 02/10/2021   Cellulitis 02/10/2021   Dementia (HCC) 02/10/2021   Gout 02/10/2021   SOB (shortness of breath) 02/05/2021   CKD (chronic kidney disease), stage III (HCC) 02/05/2021   Anxiety 12/24/2020   Impacted cerumen of right ear 12/24/2020   Acute on chronic diastolic CHF (congestive heart failure) (HCC) 11/26/2020   Bilateral leg edema 01/30/2020   COVID-19 virus infection 11/21/2019   Cough 11/21/2019   External hemorrhoid, bleeding 10/12/2019    AF (paroxysmal atrial fibrillation) (HCC) 10/02/2019   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 09/30/2019   Middle cerebral artery embolism, right 09/30/2019   Contusion of foot including toes, right, initial encounter 08/10/2018   Primary osteoarthritis of right knee 07/31/2018   Right leg swelling 07/17/2018   Pain of right calf 07/17/2018   Preventative health care 09/18/2017   Low back pain radiating to left leg 11/28/2016   Hyperlipidemia 02/02/2015   Sciatica 07/16/2014   Knee pain, acute 07/16/2014   Lower extremity pain, right 07/16/2014   Skin infection 07/16/2014   Incontinence of urine in female 06/19/2013   Cerebral aneurysm rupture (HCC) 04/08/2013   LEG EDEMA, RIGHT 07/06/2010   OTHER DRUG ALLERGY 07/06/2010   DIARRHEA 02/12/2010   HEMATURIA, HX OF 11/24/2009   ANEURYSM, HX OF 08/03/2009   B12 deficiency 07/24/2009   ACQUIRED HEMOLYTIC ANEMIA UNSPECIFIED 07/24/2009   SYNCOPE 07/20/2009   DIZZINESS 07/20/2009   Memory loss 07/20/2009   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   Hypothyroidism 12/14/2007   Essential hypertension 12/14/2007   DIVERTICULITIS, HX OF 12/14/2007   GOITER 01/30/2007   HYPERTENSION, BENIGN 01/30/2007   UTERINE POLYP 01/30/2007   THYROIDECTOMY, HX OF 01/30/2007    Past Surgical History:  Procedure Laterality Date   CARDIOVERSION N/A 04/15/2020   Procedure: CARDIOVERSION;  Surgeon: Parke Poisson, MD;  Location: [facility];  Service: Cardiovascular;  Laterality: N/A;   EYE SURGERY     lens implant   IR ANGIO VERTEBRAL SEL SUBCLAVIAN INNOMINATE UNI R MOD SED  09/30/2019   IR CT HEAD LTD  09/30/2019   IR PERCUTANEOUS ART THROMBECTOMY/INFUSION INTRACRANIAL INC DIAG ANGIO  09/30/2019   RADIOLOGY WITH ANESTHESIA N/A 09/30/2019   Procedure: IR WITH ANESTHESIA;  Surgeon: Luanne Bras, MD;  Location: [facility];  Service: Radiology;  Laterality: N/A;   TEE WITHOUT CARDIOVERSION N/A 04/15/2020    Procedure: TRANSESOPHAGEAL ECHOCARDIOGRAM (TEE);  Surgeon: Elouise Munroe, MD;  Location: [facility];  Service: Cardiovascular;  Laterality: N/A;     OB History   No obstetric history on file.     Family History  Problem Relation Age of Onset   Arthritis Mother    Heart disease Mother    Heart disease Father 83       MI   Coronary artery disease Other    Diabetes Brother        DI    Social History   Tobacco Use   Smoking status: Never   Smokeless tobacco: Never  Vaping Use   Vaping Use: Never used  Substance Use Topics   Alcohol use: No   Drug use: No    Home Medications Prior to Admission medications   Medication Sig Start Date End Date Taking? Authorizing Provider  cephALEXin (KEFLEX) 500 MG capsule Take 1 capsule (500 mg total) by mouth 4 (four) times daily. 08/21/21  Yes Cobie Leidner T, PA-C  sertraline (ZOLOFT) 50 MG tablet TAKE 1 TABLET(50 MG) BY MOUTH DAILY 08/05/21   [name], [name], DO  acetaminophen (TYLENOL) 650 MG CR tablet Take 1,300 mg by mouth daily as needed for pain (arthritis).     [provider]  COVID-19 mRNA Vac-TriS, Pfizer, SUSP injection Inject into the muscle. 04/23/21   Carlyle Basques, MD  diclofenac Sodium (VOLTAREN) 1 % GEL Apply 2 g topically daily as needed (pain). 11/26/20   [name], DO  diltiazem (CARDIZEM) 60 MG tablet Take 1 tablet (60 mg total) by mouth daily. 07/22/21   Baldwin Jamaica, PA-C  furosemide (LASIX) 20 MG tablet Take 3 tablets (60 mg total) by mouth daily. MAY TAKE AN ADDITIONAL TABLET AS NEEDED FOR SWELLING. 07/22/21   Baldwin Jamaica, PA-C  Glycerin-Hypromellose-PEG 400 ([employer] DRY EYE RELIEF) 0.2-0.2-1 % SOLN Place 1 drop into both eyes daily as needed (Dry eye).    [provider]  haloperidol (HALDOL) 1 MG tablet TAKE 1 TABLET(1 MG) BY MOUTH EVERY 8 HOURS AS NEEDED FOR AGITATION 05/10/21   [name], [name], DO  levothyroxine (SYNTHROID) 75 MCG tablet TAKE 1 TABLET(75  MCG) BY MOUTH DAILY 07/19/21   [name], DO  NONFORMULARY OR COMPOUNDED ITEM rollator walker  #1  As directed -------  Dx  CHF,  DOE,  OA 11/26/20   [name], DO  nystatin (MYCOSTATIN/NYSTOP) powder Apply 1 application topically 3 (three) times daily. 05/07/21   [name], DO  potassium chloride SA (KLOR-CON) 20 MEQ tablet Take 1 tablet (20 mEq total) by mouth daily. 07/22/21   Baldwin Jamaica, PA-C  Rivaroxaban (XARELTO) 15 MG TABS tablet TAKE 1 TABLET(15 MG) BY MOUTH DAILY WITH BREAKFAST. DISCONTINUE ELIQUIS 07/22/21   Baldwin Jamaica, PA-C  spironolactone (ALDACTONE) 25 MG tablet Take 1 tablet (25 mg total) by mouth every other day. 07/22/21   Charlcie Cradle,  Brien Few, PA-C  sulfamethoxazole-trimethoprim (BACTRIM) 400-80 MG tablet Take 1 tablet by mouth 2 (two) times daily. 04/23/21   Debbrah Alar, NP    Allergies    Patient has no known allergies.  Review of Systems   Review of Systems  Unable to perform ROS: Dementia (Level 5 caveat due to dementia)   Physical Exam Updated Vital Signs BP 133/89   Pulse 82   Temp 98.3 F (36.8 C) (Oral)   Resp 18   Ht 5\' 7"  (1.702 m)   Wt 74.9 kg   SpO2 97%   BMI 25.86 kg/m   Physical Exam Constitutional:      Appearance: Normal appearance.  HENT:     Head: Normocephalic.     Left Ear: External ear normal.     Ears:     Comments: Mild bruising of the right helix with small laceration, bleeding is controlled.  Otherwise external ear is normal.    Mouth/Throat:     Mouth: Mucous membranes are moist.  Eyes:     Pupils: Pupils are equal, round, and reactive to light.  Cardiovascular:     Rate and Rhythm: Normal rate. Rhythm irregularly irregular.     Comments: Pulses equal in all extremities.  Mild nonpitting edema to the right ankle, with no overlying skin changes. Pulmonary:     Effort: Pulmonary effort is normal. No respiratory distress.     Breath sounds: Normal breath sounds.  Abdominal:      General: There is no distension.     Palpations: Abdomen is soft.     Tenderness: There is abdominal tenderness in the suprapubic area. There is no guarding or rebound.  Musculoskeletal:     Cervical back: Neck supple.     Comments: Mild swelling of the dorsum of the right hand, with no obvious palpated deformity.  Good capillary refill.  Sensation intact in all extremities.  Skin:    General: Skin is warm and dry.  Neurological:     Mental Status: She is alert. Mental status is at baseline.     Comments: Neuro: Speech is clear, able to follow basic commands. PERRLA. Sensation intact throughout.  5/5 grip strength.    ED Results / Procedures / Treatments   Labs (all labs ordered are listed, but only abnormal results are displayed) Labs Reviewed  CBC WITH DIFFERENTIAL/PLATELET - Abnormal; Notable for the following components:      Result Value   RDW 15.9 (*)    All other components within normal limits  COMPREHENSIVE METABOLIC PANEL - Abnormal; Notable for the following components:   Creatinine, Ser 1.64 (*)    Calcium 8.8 (*)    GFR, Estimated 30 (*)    All other components within normal limits  URINALYSIS, ROUTINE W REFLEX MICROSCOPIC - Abnormal; Notable for the following components:   Nitrite POSITIVE (*)    All other components within normal limits  URINALYSIS, MICROSCOPIC (REFLEX) - Abnormal; Notable for the following components:   Bacteria, UA MANY (*)    All other components within normal limits    EKG EKG Interpretation  Date/Time:  Saturday August 21 2021 13:36:38 EDT Ventricular Rate:  92 PR Interval:    QRS Duration: 128 QT Interval:  403 QTC Calculation: 499 R Axis:   65 Text Interpretation: Atrial fibrillation Right bundle branch block Afib Confirmed by Lavenia Atlas [phone]) on 08/21/2021 3:22:56 PM  Radiology CT Head Wo Contrast  Result Date: 08/21/2021 CLINICAL DATA:  Fall today. Headache and neck pain.  Initial encounter. EXAM: CT HEAD WITHOUT CONTRAST  CT CERVICAL SPINE WITHOUT CONTRAST TECHNIQUE: Multidetector CT imaging of the head and cervical spine was performed following the standard protocol without intravenous contrast. Multiplanar CT image reconstructions of the cervical spine were also generated. COMPARISON:  None. FINDINGS: CT HEAD FINDINGS Brain: No evidence of acute infarction, hemorrhage, hydrocephalus, extra-axial collection, or mass lesion/mass effect. Moderate diffuse cerebral atrophy and chronic small vessel disease are noted. Old left basal ganglia and thalamic lacunar infarcts are seen. Vascular:  No hyperdense vessel or other acute findings. Skull: No evidence of fracture or other significant bone abnormality. Sinuses/Orbits:  No acute findings. Other: None. CT CERVICAL SPINE FINDINGS Alignment: Grade 1 anterolisthesis is seen at C7-T1, attributable to degenerative changes seen at this level. Skull base and vertebrae: No acute fracture. No primary bone lesion or focal pathologic process. Soft tissues and spinal canal: No prevertebral fluid or swelling. No visible canal hematoma. Disc levels: Multilevel degenerative disc disease is seen which is most severe from levels of C3 through C6. Bilateral facet DJD is also seen, left side greater than right. Upper chest: No acute findings. Other: None. IMPRESSION: No acute intracranial abnormality. Moderate cerebral atrophy, chronic small vessel disease, and old left basal ganglia and thalamic lacunar infarcts. No evidence of acute cervical spine fracture. Degenerative spondylosis, as described above. Electronically Signed   By: Marlaine Hind M.D.   On: 08/21/2021 15:33   CT Cervical Spine Wo Contrast  Result Date: 08/21/2021 CLINICAL DATA:  Fall today. Headache and neck pain. Initial encounter. EXAM: CT HEAD WITHOUT CONTRAST CT CERVICAL SPINE WITHOUT CONTRAST TECHNIQUE: Multidetector CT imaging of the head and cervical spine was performed following the standard protocol without intravenous contrast.  Multiplanar CT image reconstructions of the cervical spine were also generated. COMPARISON:  None. FINDINGS: CT HEAD FINDINGS Brain: No evidence of acute infarction, hemorrhage, hydrocephalus, extra-axial collection, or mass lesion/mass effect. Moderate diffuse cerebral atrophy and chronic small vessel disease are noted. Old left basal ganglia and thalamic lacunar infarcts are seen. Vascular:  No hyperdense vessel or other acute findings. Skull: No evidence of fracture or other significant bone abnormality. Sinuses/Orbits:  No acute findings. Other: None. CT CERVICAL SPINE FINDINGS Alignment: Grade 1 anterolisthesis is seen at C7-T1, attributable to degenerative changes seen at this level. Skull base and vertebrae: No acute fracture. No primary bone lesion or focal pathologic process. Soft tissues and spinal canal: No prevertebral fluid or swelling. No visible canal hematoma. Disc levels: Multilevel degenerative disc disease is seen which is most severe from levels of C3 through C6. Bilateral facet DJD is also seen, left side greater than right. Upper chest: No acute findings. Other: None. IMPRESSION: No acute intracranial abnormality. Moderate cerebral atrophy, chronic small vessel disease, and old left basal ganglia and thalamic lacunar infarcts. No evidence of acute cervical spine fracture. Degenerative spondylosis, as described above. Electronically Signed   By: Marlaine Hind M.D.   On: 08/21/2021 15:33   DG Hand 2 View Right  Result Date: 08/21/2021 CLINICAL DATA:  Fall today EXAM: RIGHT HAND - 2 VIEW COMPARISON:  None. FINDINGS: Negative for fracture or dislocation Advanced diffuse osteoarthritis. IMPRESSION: Negative for fracture. Electronically Signed   By: Franchot Gallo M.D.   On: 08/21/2021 15:28    Procedures Procedures   Medications Ordered in ED Medications - No data to display  ED Course  I have reviewed the triage vital signs and the nursing notes.  Pertinent labs & imaging results that  were available  during my care of the patient were reviewed by me and considered in my medical decision making (see chart for details).    MDM Rules/Calculators/A&P                           Patient is 87 year old female with history of atrial fibrillation on rivaroxaban, dementia, brain aneurysms who presents to the emergency department after a fall occurring earlier today.  Fall was unwitnessed at the memory care unit where she lives, but staff found her directly after.  There was no loss of consciousness, but patient was found to be bleeding from her right ear.  Daughter at bedside states that she is otherwise at baseline.  Daughter also reports patient was recently placed on tramadol after a suppose it did right hand injury, and wonders if this medication contributed to her symptoms today.  On my exam patient appears to be at baseline without neurologic deficit.  She follows basic commands.  5/5 grip strength in bilateral hands.  No obvious deformities noted to the right hand.  Sensation intact in all extremities.  There is a small laceration over the right helix, bleeding is controlled.  There is also mild ecchymoses behind the ear, with no deformities palpated.  CT imaging of the head and neck showed no acute intracranial abnormalities.  X-ray imaging of the right hand showed no acute fractures or dislocations.  Urinalysis showed urinary tract infection.  Lab work otherwise unremarkable.  Fall could possibly been related to the urinary tract infection as well as new tramadol dosing in an elderly patient.  She is otherwise hemodynamically stable and not requiring admission or inpatient treatment for symptoms at this time.  Plan to treat UTI with antibiotics, and change current pain regimen from tramadol to Tylenol and will make living facility aware of this change.  Discussed reasons to return to the emergency department, daughter agreeable to plan.    Final Clinical Impression(s) / ED  Diagnoses Final diagnoses:  Fall, initial encounter  Right hand pain    Rx / DC Orders ED Discharge Orders          Ordered    cephALEXin (KEFLEX) 500 MG capsule  4 times daily        08/21/21 1927             Dola Lunsford T, PA-C 08/21/21 1942    Lorelle Gibbs, DO 08/22/21 [id number]

## 2021-09-13 ENCOUNTER — Other Ambulatory Visit: Payer: Self-pay

## 2021-09-13 ENCOUNTER — Encounter: Payer: Self-pay | Admitting: Family Medicine

## 2021-09-13 ENCOUNTER — Ambulatory Visit (INDEPENDENT_AMBULATORY_CARE_PROVIDER_SITE_OTHER): Payer: Medicare Other | Admitting: Family Medicine

## 2021-09-13 VITALS — BP 106/80 | HR 84 | Temp 97.5°F | Resp 20 | Ht 67.0 in

## 2021-09-13 DIAGNOSIS — T148XXA Other injury of unspecified body region, initial encounter: Secondary | ICD-10-CM | POA: Diagnosis not present

## 2021-09-13 DIAGNOSIS — M2540 Effusion, unspecified joint: Secondary | ICD-10-CM | POA: Diagnosis not present

## 2021-09-13 DIAGNOSIS — R296 Repeated falls: Secondary | ICD-10-CM

## 2021-09-13 DIAGNOSIS — F419 Anxiety disorder, unspecified: Secondary | ICD-10-CM | POA: Diagnosis not present

## 2021-09-13 DIAGNOSIS — I10 Essential (primary) hypertension: Secondary | ICD-10-CM

## 2021-09-13 LAB — CBC WITH DIFFERENTIAL/PLATELET
Basophils Absolute: 0 10*3/uL (ref 0.0–0.1)
Basophils Relative: 0.2 % (ref 0.0–3.0)
Eosinophils Absolute: 0 10*3/uL (ref 0.0–0.7)
Eosinophils Relative: 0.3 % (ref 0.0–5.0)
HCT: 44.9 % (ref 36.0–46.0)
Hemoglobin: 14.8 g/dL (ref 12.0–15.0)
Lymphocytes Relative: 16.7 % (ref 12.0–46.0)
Lymphs Abs: 1.3 10*3/uL (ref 0.7–4.0)
MCHC: 33 g/dL (ref 30.0–36.0)
MCV: 94.5 fl (ref 78.0–100.0)
Monocytes Absolute: 0.6 10*3/uL (ref 0.1–1.0)
Monocytes Relative: 7.8 % (ref 3.0–12.0)
Neutro Abs: 6 10*3/uL (ref 1.4–7.7)
Neutrophils Relative %: 75 % (ref 43.0–77.0)
Platelets: 236 10*3/uL (ref 150.0–400.0)
RBC: 4.75 Mil/uL (ref 3.87–5.11)
RDW: 15.9 % — ABNORMAL HIGH (ref 11.5–15.5)
WBC: 8 10*3/uL (ref 4.0–10.5)

## 2021-09-13 LAB — COMPREHENSIVE METABOLIC PANEL
ALT: 8 U/L (ref 0–35)
AST: 14 U/L (ref 0–37)
Albumin: 4.3 g/dL (ref 3.5–5.2)
Alkaline Phosphatase: 123 U/L — ABNORMAL HIGH (ref 39–117)
BUN: 25 mg/dL — ABNORMAL HIGH (ref 6–23)
CO2: 23 mEq/L (ref 19–32)
Calcium: 9.5 mg/dL (ref 8.4–10.5)
Chloride: 104 mEq/L (ref 96–112)
Creatinine, Ser: 1.69 mg/dL — ABNORMAL HIGH (ref 0.40–1.20)
GFR: 27.02 mL/min — ABNORMAL LOW (ref >60.00)
Glucose, Bld: 82 mg/dL (ref 70–99)
Potassium: 4.5 mEq/L (ref 3.5–5.1)
Sodium: 140 mEq/L (ref 135–145)
Total Bilirubin: 0.8 mg/dL (ref 0.2–1.2)
Total Protein: 6.6 g/dL (ref 6.0–8.3)

## 2021-09-13 LAB — LIPID PANEL
Cholesterol: 177 mg/dL (ref 0–200)
HDL: 40.3 mg/dL (ref >39.00)
LDL Cholesterol: 112 mg/dL — ABNORMAL HIGH (ref 0–99)
NonHDL: 137.13
Total CHOL/HDL Ratio: 4
Triglycerides: 128 mg/dL (ref 0.0–149.0)
VLDL: 25.6 mg/dL (ref 0.0–40.0)

## 2021-09-13 LAB — TSH: TSH: 1.82 u[IU]/mL (ref 0.35–5.50)

## 2021-09-13 LAB — SEDIMENTATION RATE: Sed Rate: 13 mm/hr (ref 0–30)

## 2021-09-13 LAB — URIC ACID: Uric Acid, Serum: 7.1 mg/dL — ABNORMAL HIGH (ref 2.4–7.0)

## 2021-09-13 MED ORDER — VALACYCLOVIR HCL 1 G PO TABS
1000.0000 mg | ORAL_TABLET | Freq: Three times a day (TID) | ORAL | 0 refills | Status: AC
Start: 2021-09-13 — End: 2021-09-20

## 2021-09-13 MED ORDER — SERTRALINE HCL 50 MG PO TABS: ORAL_TABLET | ORAL | 3 refills | Status: DC

## 2021-09-13 NOTE — Assessment & Plan Note (Signed)
Restart zoloft 50 mg  Worsened after dose lowered

## 2021-09-13 NOTE — Progress Notes (Addendum)
Established Patient Office Visit  Subjective:  Patient ID: Belinda Anderson, female    DOB: 1932/07/16  Age: 87 y.o. MRN: [id number]  CC:  Chief Complaint  Patient presents with  . ED visit follow up    Fall, no injury, Pt currently at [facility]. Pt having swelling in right hand and sores on her bottom.     HPI Belinda Anderson presents for ed f/u for fall.  No injury.  Her daughter is with her and c/o R hand swelling and sores on her bottom.   She had a fall at the facility and virtual dr did a visit with her and felt her zoloft was part of the problem-- so they lowered her zoloft dose.  Her anxiety has been worse since.    Past Medical History:  Diagnosis Date  . Arthritis   . Atrial fibrillation ([place])   . Hypertension   . Thyroid disease   . TIA (transient ischemic attack)     Past Surgical History:  Procedure Laterality Date  . CARDIOVERSION N/A 04/15/2020   Procedure: CARDIOVERSION;  Surgeon: Elouise Munroe, MD;  Location: [facility] ENDOSCOPY;  Service: Cardiovascular;  Laterality: N/A;  . EYE SURGERY     lens implant  . IR ANGIO VERTEBRAL SEL SUBCLAVIAN INNOMINATE UNI R MOD SED  09/30/2019  . IR CT HEAD LTD  09/30/2019  . IR PERCUTANEOUS ART THROMBECTOMY/INFUSION INTRACRANIAL INC DIAG ANGIO  09/30/2019  . RADIOLOGY WITH ANESTHESIA N/A 09/30/2019   Procedure: IR WITH ANESTHESIA;  Surgeon: Luanne Bras, MD;  Location: [place];  Service: Radiology;  Laterality: N/A;  . TEE WITHOUT CARDIOVERSION N/A 04/15/2020   Procedure: TRANSESOPHAGEAL ECHOCARDIOGRAM (TEE);  Surgeon: Elouise Munroe, MD;  Location: [facility] ENDOSCOPY;  Service: Cardiovascular;  Laterality: N/A;    Family History  Problem Relation Age of Onset  . Arthritis Mother   . Heart disease Mother   . Heart disease Father 61       MI  . Coronary artery disease Other   . Diabetes Brother        DI    Social History   Socioeconomic History  . Marital status: Widowed    Spouse name: Not on file  . Number of  children: 3  . Years of education: 65  . Highest education level: 12th grade  Occupational History  . Not on file  Tobacco Use  . Smoking status: Never  . Smokeless tobacco: Never  Vaping Use  . Vaping Use: Never used  Substance and Sexual Activity  . Alcohol use: No  . Drug use: No  . Sexual activity: Not Currently    Partners: Male  Other Topics Concern  . Not on file  Social History Narrative   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, TEFL teacher   Social Determinants of Health   Financial Resource Strain: Low Risk   . Difficulty of Paying Living Expenses: Not hard at all  Food Insecurity: No Food Insecurity  . Worried About Charity fundraiser in the Last Year: Never true  . Ran Out of Food in the Last Year: Never true  Transportation Needs: No Transportation Needs  . Lack of Transportation (Medical): No  . Lack of Transportation (Non-Medical): No  Physical Activity: Insufficiently Active  . Days of Exercise per Week: 3 days  . Minutes of Exercise per Session: 20 min  Stress: No Stress Concern Present  . Feeling of Stress : Only a little  Social Connections: Moderately Isolated  .  Frequency of Communication with Friends and Family: More than three times a week  . Frequency of Social Gatherings with Friends and Family: More than three times a week  . Attends Religious Services: More than 4 times per year  . Active Member of Clubs or Organizations: No  . Attends Archivist Meetings: Never  . Marital Status: Widowed  Intimate Partner Violence: Not At Risk  . Fear of Current or Ex-Partner: No  . Emotionally Abused: No  . Physically Abused: No  . Sexually Abused: No    Outpatient Medications Prior to Visit  Medication Sig Dispense Refill  . acetaminophen (TYLENOL) 650 MG CR tablet Take 1,300 mg by mouth daily as needed for pain (arthritis).     Marland Kitchen diclofenac Sodium (VOLTAREN) 1 % GEL Apply 2 g topically daily as needed (pain). 150 g 3  .  diltiazem (CARDIZEM) 60 MG tablet Take 1 tablet (60 mg total) by mouth daily. 30 tablet 3  . furosemide (LASIX) 20 MG tablet Take 3 tablets (60 mg total) by mouth daily. MAY TAKE AN ADDITIONAL TABLET AS NEEDED FOR SWELLING. 270 tablet 2  . Glycerin-Hypromellose-PEG 400 ([facility] DRY EYE RELIEF) 0.2-0.2-1 % SOLN Place 1 drop into both eyes daily as needed (Dry eye).    . haloperidol (HALDOL) 1 MG tablet TAKE 1 TABLET(1 MG) BY MOUTH EVERY 8 HOURS AS NEEDED FOR AGITATION 60 tablet 1  . levothyroxine (SYNTHROID) 75 MCG tablet TAKE 1 TABLET(75 MCG) BY MOUTH DAILY 90 tablet 0  . NONFORMULARY OR COMPOUNDED ITEM rollator walker  #1  As directed -------  Dx  CHF,  DOE,  OA 1 each 0  . nystatin (MYCOSTATIN/NYSTOP) powder Apply 1 application topically 3 (three) times daily. 60 g 3  . potassium chloride SA (KLOR-CON) 20 MEQ tablet Take 1 tablet (20 mEq total) by mouth daily. 90 tablet 3  . Rivaroxaban (XARELTO) 15 MG TABS tablet TAKE 1 TABLET(15 MG) BY MOUTH DAILY WITH BREAKFAST. DISCONTINUE ELIQUIS 90 tablet 2  . spironolactone (ALDACTONE) 25 MG tablet Take 1 tablet (25 mg total) by mouth every other day. 45 tablet 3  . sertraline (ZOLOFT) 50 MG tablet TAKE 1 TABLET(50 MG) BY MOUTH DAILY 90 tablet 0  . cephALEXin (KEFLEX) 500 MG capsule Take 1 capsule (500 mg total) by mouth 4 (four) times daily. (Patient not taking: Reported on 09/13/2021) 20 capsule 0  . COVID-19 mRNA Vac-TriS, Pfizer, SUSP injection Inject into the muscle. (Patient not taking: Reported on 09/13/2021) 0.3 mL 0  . sulfamethoxazole-trimethoprim (BACTRIM) 400-80 MG tablet Take 1 tablet by mouth 2 (two) times daily. 10 tablet 0   No facility-administered medications prior to visit.    No Known Allergies  ROS Review of Systems  Constitutional:  Negative for appetite change, diaphoresis, fatigue and unexpected weight change.  Eyes:  Negative for pain, redness and visual disturbance.  Respiratory:  Negative for cough, chest tightness, shortness  of breath and wheezing.   Cardiovascular:  Negative for chest pain, palpitations and leg swelling.  Endocrine: Negative for cold intolerance, heat intolerance, polydipsia, polyphagia and polyuria.  Genitourinary:  Negative for difficulty urinating, dysuria and frequency.  Musculoskeletal:  Positive for joint swelling.  Skin:  Positive for wound.  Neurological:  Negative for dizziness, light-headedness, numbness and headaches.  Psychiatric/Behavioral:  Positive for confusion. The patient is nervous/anxious.      Objective:    Physical Exam Vitals and nursing note reviewed.  Constitutional:      Appearance: She is well-developed.  HENT:  Head: Normocephalic and atraumatic.  Eyes:     Conjunctiva/sclera: Conjunctivae normal.  Neck:     Thyroid: No thyromegaly.     Vascular: No carotid bruit or JVD.  Cardiovascular:     Rate and Rhythm: Normal rate and regular rhythm.     Heart sounds: Normal heart sounds. No murmur heard. Pulmonary:     Effort: Pulmonary effort is normal. No respiratory distress.     Breath sounds: Normal breath sounds. No wheezing or rales.  Chest:     Chest wall: No tenderness.  Musculoskeletal:        General: Swelling present.     Right hand: Swelling present. No tenderness.     Left hand: No swelling or tenderness.     Cervical back: Normal range of motion and neck supple.  Skin:    Findings: Erythema and rash present. No lesion.          Comments: Blisters on buttocks ? Friction blisters   Neurological:     Mental Status: She is alert and oriented to person, place, and time.    BP 106/80 (BP Location: Left Arm, Patient Position: Sitting, Cuff Size: Normal)   Pulse 84   Temp (!) 97.5 F (36.4 C) (Oral)   Resp 20   Ht 5' 7" (1.702 m)   SpO2 100%   BMI 25.86 kg/m  Wt Readings from Last 3 Encounters:  08/21/21 165 lb 2 oz (74.9 kg)  07/22/21 147 lb 6.4 oz (66.9 kg)  05/07/21 149 lb 6.4 oz (67.8 kg)     Health Maintenance Due  Topic Date  Due  . FOOT EXAM  Never done  . OPHTHALMOLOGY EXAM  Never done  . URINE MICROALBUMIN  Never done  . TETANUS/TDAP  Never done  . Zoster Vaccines- Shingrix (1 of 2) Never done  . DEXA SCAN  Never done  . MAMMOGRAM  03/28/2013  . HEMOGLOBIN A1C  05/26/2021  . COVID-19 Vaccine (5 - Booster for Pfizer series) 06/18/2021    There are no preventive care reminders to display for this patient.  Lab Results  Component Value Date   TSH 1.60 05/07/2021   Lab Results  Component Value Date   WBC 7.3 08/21/2021   HGB 14.5 08/21/2021   HCT 43.9 08/21/2021   MCV 97.1 08/21/2021   PLT 212 08/21/2021   Lab Results  Component Value Date   NA 138 08/21/2021   K 3.9 08/21/2021   CO2 27 08/21/2021   GLUCOSE 98 08/21/2021   BUN 23 08/21/2021   CREATININE 1.64 (H) 08/21/2021   BILITOT 0.5 08/21/2021   ALKPHOS 116 08/21/2021   AST 20 08/21/2021   ALT 11 08/21/2021   PROT 6.8 08/21/2021   ALBUMIN 3.8 08/21/2021   CALCIUM 8.8 (L) 08/21/2021   ANIONGAP 8 08/21/2021   EGFR 28 (L) 07/22/2021   GFR 23.84 (L) 05/07/2021   Lab Results  Component Value Date   CHOL 117 01/30/2020   Lab Results  Component Value Date   HDL 33.80 (L) 01/30/2020   Lab Results  Component Value Date   LDLCALC 68 01/30/2020   Lab Results  Component Value Date   TRIG 75.0 01/30/2020   Lab Results  Component Value Date   CHOLHDL 3 01/30/2020   Lab Results  Component Value Date   HGBA1C 6.1 11/26/2020      Assessment & Plan:   Problem List Items Addressed This Visit       Unprioritized   Anxiety  Restart zoloft 50 mg  Worsened after dose lowered       Relevant Medications   sertraline (ZOLOFT) 50 MG tablet   Other Relevant Orders   Comprehensive metabolic panel   CBC with Differential/Platelet   Lipid panel   TSH   Antinuclear Antib (ANA)   Rheumatoid Factor   Sedimentation rate   Friction blisters of the skin    Wound care ordered  Powder refilled       Relevant Medications    valACYclovir (VALTREX) 1000 MG tablet   Other Relevant Orders   Ambulatory referral to [place] swelling - Primary    Cold compresses prn Check labs Consider ortho + arthritis on xray       Relevant Orders   Comprehensive metabolic panel   CBC with Differential/Platelet   Lipid panel   TSH   Antinuclear Antib (ANA)   Rheumatoid Factor   Sedimentation rate   Uric acid   Other Visit Diagnoses     Primary hypertension       Relevant Orders   Comprehensive metabolic panel   CBC with Differential/Platelet   Lipid panel   TSH   Antinuclear Antib (ANA)   Rheumatoid Factor   Sedimentation rate       Meds ordered this encounter  Medications  . sertraline (ZOLOFT) 50 MG tablet    Sig: TAKE 1 TABLET(50 MG) BY MOUTH DAILY    Dispense:  90 tablet    Refill:  3  . valACYclovir (VALTREX) 1000 MG tablet    Sig: Take 1 tablet (1,000 mg total) by mouth 3 (three) times daily for 7 days.    Dispense:  21 tablet    Refill:  0    Follow-up: Return in about 4 weeks (around 10/11/2021), or if symptoms worsen or fail to improve, for anxiety.    [name], DO

## 2021-09-13 NOTE — Assessment & Plan Note (Signed)
Wound care ordered  Powder refilled

## 2021-09-13 NOTE — Patient Instructions (Signed)

## 2021-09-13 NOTE — Assessment & Plan Note (Signed)
Cold compresses prn Check labs Consider ortho + arthritis on xray

## 2021-09-14 ENCOUNTER — Telehealth: Payer: Self-pay | Admitting: Family Medicine

## 2021-09-14 NOTE — Telephone Encounter (Signed)
Marcelino Duster from [facility] called regarding verbal orders for skill nursing once a week for 4 weeks. Tele: [phone]

## 2021-09-15 LAB — ANTI-NUCLEAR AB-TITER (ANA TITER): ANA Titer 1: 1:80 {titer} — ABNORMAL HIGH

## 2021-09-15 LAB — RHEUMATOID FACTOR: Rheumatoid fact SerPl-aCnc: <14 IU/mL (ref <14)

## 2021-09-15 LAB — ANA: Anti Nuclear Antibody (ANA): POSITIVE — AB

## 2021-09-15 NOTE — Telephone Encounter (Signed)
Verbal given 

## 2021-09-17 ENCOUNTER — Telehealth: Payer: Self-pay | Admitting: Family Medicine

## 2021-09-17 NOTE — Telephone Encounter (Signed)
Cecilia: [facility]: [phone] No decline with mobility and also pt declined PT services. Daughter is also in agreement.

## 2021-09-21 ENCOUNTER — Other Ambulatory Visit: Payer: Self-pay | Admitting: Family Medicine

## 2021-09-21 ENCOUNTER — Encounter: Payer: Self-pay | Admitting: Family Medicine

## 2021-09-22 NOTE — Telephone Encounter (Signed)
FYI

## 2021-09-24 ENCOUNTER — Other Ambulatory Visit: Payer: Self-pay

## 2021-09-24 DIAGNOSIS — N289 Disorder of kidney and ureter, unspecified: Secondary | ICD-10-CM

## 2021-09-28 NOTE — Telephone Encounter (Signed)
Order was sent

## 2021-09-30 NOTE — Addendum Note (Signed)
Addended by: Seabron Spates R on: 09/30/2021 02:20 PM   Modules accepted: Orders

## 2021-10-04 ENCOUNTER — Other Ambulatory Visit: Payer: Self-pay | Admitting: Family Medicine

## 2021-10-04 DIAGNOSIS — F02818 Dementia in other diseases classified elsewhere, unspecified severity, with other behavioral disturbance: Secondary | ICD-10-CM

## 2021-10-04 MED ORDER — HALOPERIDOL 1 MG PO TABS
1.0000 mg | ORAL_TABLET | Freq: Three times a day (TID) | ORAL | 1 refills | Status: DC
Start: 2021-10-04 — End: 2021-10-26

## 2021-10-04 MED ORDER — HALOPERIDOL 1 MG PO TABS
1.0000 mg | ORAL_TABLET | Freq: Three times a day (TID) | ORAL | 1 refills | Status: DC
Start: 2021-10-04 — End: 2021-10-04

## 2021-10-13 ENCOUNTER — Other Ambulatory Visit: Payer: Self-pay

## 2021-10-13 ENCOUNTER — Ambulatory Visit: Payer: Medicare Other | Admitting: Podiatry

## 2021-10-13 ENCOUNTER — Encounter: Payer: Self-pay | Admitting: Podiatry

## 2021-10-13 DIAGNOSIS — M79676 Pain in unspecified toe(s): Secondary | ICD-10-CM | POA: Diagnosis not present

## 2021-10-13 DIAGNOSIS — B351 Tinea unguium: Secondary | ICD-10-CM

## 2021-10-13 DIAGNOSIS — D689 Coagulation defect, unspecified: Secondary | ICD-10-CM

## 2021-10-13 NOTE — Progress Notes (Signed)
HPI: Patient is 87 y.o. female who presents today for elongated and thickened toenails which are difficult to trim.  They are painful in shoes and ambulation when they become this long.  She is also on a blood thinner.  She presents today with her caregiver.  Patient Active Problem List   Diagnosis Date Noted   Joint swelling 09/13/2021   Friction blisters of the skin 09/13/2021   Pressure ulcer of sacral region, stage 1 04/23/2021   Intertrigo 04/23/2021   Abnormal urine odor 04/23/2021   Left foot pain 02/10/2021   Weakness 02/10/2021   Urinary tract infection without hematuria 02/10/2021   Cellulitis 02/10/2021   Dementia (HCC) 02/10/2021   Gout 02/10/2021   SOB (shortness of breath) 02/05/2021   CKD (chronic kidney disease), stage III (HCC) 02/05/2021   Anxiety 12/24/2020   Impacted cerumen of right ear 12/24/2020   Acute on chronic diastolic CHF (congestive heart failure) (HCC) 11/26/2020   Bilateral leg edema 01/30/2020   COVID-19 virus infection 11/21/2019   Cough 11/21/2019   External hemorrhoid, bleeding 10/12/2019   AF (paroxysmal atrial fibrillation) (HCC) 10/02/2019   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 09/30/2019   Middle cerebral artery embolism, right 09/30/2019   Contusion of foot including toes, right, initial encounter 08/10/2018   Primary osteoarthritis of right knee 07/31/2018   Right leg swelling 07/17/2018   Pain of right calf 07/17/2018   Preventative health care 09/18/2017   Low back pain radiating to left leg 11/28/2016   Hyperlipidemia 02/02/2015   Sciatica 07/16/2014   Knee pain, acute 07/16/2014   Lower extremity pain, right 07/16/2014   Skin infection 07/16/2014   Incontinence of urine in female 06/19/2013   Cerebral aneurysm rupture (HCC) 04/08/2013   LEG EDEMA, RIGHT 07/06/2010   OTHER DRUG ALLERGY 07/06/2010   DIARRHEA 02/12/2010   HEMATURIA, HX OF 11/24/2009   ANEURYSM, HX OF 08/03/2009    B12 deficiency 07/24/2009   ACQUIRED HEMOLYTIC ANEMIA UNSPECIFIED 07/24/2009   SYNCOPE 07/20/2009   DIZZINESS 07/20/2009   Memory loss 07/20/2009   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      diclofenac Sodium (VOLTAREN) 1 % GEL Apply 2 g topically daily as needed (pain). 150 g 3   diltiazem (CARDIZEM) 60 MG tablet Take 1 tablet (60 mg total) by mouth daily. 30 tablet 3   furosemide (LASIX) 20 MG tablet Take 3 tablets (60 mg total) by mouth daily. MAY TAKE AN ADDITIONAL TABLET AS NEEDED FOR SWELLING. 270 tablet 2   Glycerin-Hypromellose-PEG 400 ([facility] DRY EYE RELIEF) 0.2-0.2-1 % SOLN Place 1 drop into both eyes daily as needed (Dry eye).     haloperidol (HALDOL) 1 MG tablet Take 1 tablet (1 mg total) by mouth 3 (three) times daily. 90 tablet 1   levothyroxine (SYNTHROID) 75 MCG tablet TAKE 1 TABLET(75 MCG) BY MOUTH DAILY 90 tablet 0   NONFORMULARY OR COMPOUNDED ITEM rollator walker  #1  As directed -------  Dx  CHF,  DOE,  OA 1 each 0  nystatin (MYCOSTATIN/NYSTOP) powder Apply 1 application topically 3 (three) times daily. 60 g 3   potassium chloride SA (KLOR-CON) 20 MEQ tablet Take 1 tablet (20 mEq total) by mouth daily. 90 tablet 3   Rivaroxaban (XARELTO) 15 MG TABS tablet TAKE 1 TABLET(15 MG) BY MOUTH DAILY WITH BREAKFAST. DISCONTINUE ELIQUIS 90 tablet 2   sertraline (ZOLOFT) 50 MG tablet TAKE 1 TABLET(50 MG) BY MOUTH DAILY 90 tablet 3   spironolactone (ALDACTONE) 25 MG tablet Take 1 tablet (25 mg total) by mouth every other day. 45 tablet 3   No current facility-administered  medications on file prior to visit.    No Known Allergies  Review of Systems No fevers, chills, nausea, muscle aches, no difficulty breathing, no calf pain, no chest pain or shortness of breath.   Physical Exam  GENERAL APPEARANCE: Alert, conversant. Appropriately groomed. No acute distress.   VASCULAR: Pedal pulses palpable DP and PT bilateral.  Capillary refill time is immediate to all digits,  Proximal to distal cooling is cool to cool purplish discoloration around the base of the hallux is seen consistent with what appears to be Raynaud's phenomenon.  NEUROLOGIC: sensation is intact to 5.07 monofilament at 5/5 sites bilateral.  Light touch is intact bilateral, vibratory sensation intact bilateral  MUSCULOSKELETAL: acceptable muscle strength, tone and stability bilateral.  No gross boney pedal deformities noted.  No pain, crepitus or limitation noted with foot and ankle range of motion bilateral.   DERMATOLOGIC: skin is warm, supple, and dry.  No open lesions noted.  No rash, no pre ulcerative lesions.  Digital nails are elongated, thickened, dystrophic, brittle and clinically mycotic bilateral.  Some discomfort with dorsal plantar pressure is also elicited with exam.    Assessment   1. Pain due to onychomycosis of nail   2. Coagulation defect ([place])       Plan -Discussed and educated patient onfoot care, especially with regards to the vascular, neurological and musculoskeletal systems.  -Discussed supportive shoes at all times and checking feet regularly.  -Mechanically debrided all nails 1-5 bilateral using sterile nail nipper and filed with dremel without incident  -Answered all patient questions -Patient to return  in 3 months for at risk foot care -Patient advised to call the office if any problems or questions arise in the meantime.   Lorenda Peck, DPM

## 2021-10-19 ENCOUNTER — Telehealth: Payer: Self-pay | Admitting: Family Medicine

## 2021-10-19 ENCOUNTER — Encounter: Payer: Self-pay | Admitting: Family Medicine

## 2021-10-19 ENCOUNTER — Ambulatory Visit (INDEPENDENT_AMBULATORY_CARE_PROVIDER_SITE_OTHER): Payer: Medicare Other | Admitting: Family Medicine

## 2021-10-19 VITALS — BP 108/80 | HR 75 | Temp 98.0°F | Resp 20 | Ht 67.0 in

## 2021-10-19 DIAGNOSIS — I480 Paroxysmal atrial fibrillation: Secondary | ICD-10-CM

## 2021-10-19 DIAGNOSIS — I63411 Cerebral infarction due to embolism of right middle cerebral artery: Secondary | ICD-10-CM

## 2021-10-19 DIAGNOSIS — D599 Acquired hemolytic anemia, unspecified: Secondary | ICD-10-CM | POA: Diagnosis not present

## 2021-10-19 DIAGNOSIS — I5032 Chronic diastolic (congestive) heart failure: Secondary | ICD-10-CM

## 2021-10-19 DIAGNOSIS — R531 Weakness: Secondary | ICD-10-CM

## 2021-10-19 DIAGNOSIS — F419 Anxiety disorder, unspecified: Secondary | ICD-10-CM

## 2021-10-19 DIAGNOSIS — L89301 Pressure ulcer of unspecified buttock, stage 1: Secondary | ICD-10-CM | POA: Diagnosis not present

## 2021-10-19 DIAGNOSIS — M2540 Effusion, unspecified joint: Secondary | ICD-10-CM

## 2021-10-19 DIAGNOSIS — R296 Repeated falls: Secondary | ICD-10-CM

## 2021-10-19 DIAGNOSIS — I10 Essential (primary) hypertension: Secondary | ICD-10-CM

## 2021-10-19 DIAGNOSIS — I5033 Acute on chronic diastolic (congestive) heart failure: Secondary | ICD-10-CM

## 2021-10-19 DIAGNOSIS — I4821 Permanent atrial fibrillation: Secondary | ICD-10-CM

## 2021-10-19 MED ORDER — NYSTATIN 100000 UNIT/GM EX POWD: 1.0000 "application " | Freq: Three times a day (TID) | CUTANEOUS | 3 refills | Status: DC

## 2021-10-19 NOTE — Assessment & Plan Note (Signed)
Improved since zoloft restarted

## 2021-10-19 NOTE — Assessment & Plan Note (Signed)
stable °

## 2021-10-19 NOTE — Assessment & Plan Note (Signed)
Well controlled, no changes to meds. Encouraged heart healthy diet such as the DASH diet and exercise as tolerated.  °

## 2021-10-19 NOTE — Patient Instructions (Signed)

## 2021-10-19 NOTE — Assessment & Plan Note (Signed)
improved

## 2021-10-19 NOTE — Progress Notes (Addendum)
Established Patient Office Visit  Subjective:  Patient ID: Belinda Anderson, female    DOB: [birth year]  Age: 88 y.o. MRN: [id number]  CC:  Chief Complaint  Patient presents with   Joint Swelling   Anxiety   Fall   Follow-up    HPI Belinda Anderson presents for f/u anxiety and swelling   the anxiety has improved since restarted her zoloft.  Her daughter is with her.  No more falls but she does c/o weakness In the left leg and the daughter is requesting PT referral.  It her daughter is not with her or she does not have her walker she would fall  No other new problems   Past Medical History:  Diagnosis Date   Arthritis    Atrial fibrillation ([place])    Hypertension    Thyroid disease    TIA (transient ischemic attack)     Past Surgical History:  Procedure Laterality Date   CARDIOVERSION N/A 04/15/2020   Procedure: CARDIOVERSION;  Surgeon: Elouise Munroe, MD;  Location: [place];  Service: Cardiovascular;  Laterality: N/A;   EYE SURGERY     lens implant   IR ANGIO VERTEBRAL SEL SUBCLAVIAN INNOMINATE UNI R MOD SED  09/30/2019   IR CT HEAD LTD  09/30/2019   IR PERCUTANEOUS ART THROMBECTOMY/INFUSION INTRACRANIAL INC DIAG ANGIO  09/30/2019   RADIOLOGY WITH ANESTHESIA N/A 09/30/2019   Procedure: IR WITH ANESTHESIA;  Surgeon: Luanne Bras, MD;  Location: [place];  Service: Radiology;  Laterality: N/A;   TEE WITHOUT CARDIOVERSION N/A 04/15/2020   Procedure: TRANSESOPHAGEAL ECHOCARDIOGRAM (TEE);  Surgeon: Elouise Munroe, MD;  Location: [facility] ENDOSCOPY;  Service: Cardiovascular;  Laterality: N/A;    Family History  Problem Relation Age of Onset   Arthritis Mother    Heart disease Mother    Heart disease Father 39       MI   Coronary artery disease Other    Diabetes Brother        DI    Social History   Socioeconomic History   Marital status: Widowed    Spouse name: Not on file   Number of children: 3   Years of education: 73   Highest education level: 12th  grade  Occupational History   Not on file  Tobacco Use   Smoking status: Never   Smokeless tobacco: Never  Vaping Use   Vaping Use: Never used  Substance and Sexual Activity   Alcohol use: No   Drug use: No   Sexual activity: Not Currently    Partners: Male  Other Topics Concern   Not on file  Social History Narrative   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, TEFL teacher   Social Determinants of Health   Financial Resource Strain: Low Risk    Difficulty of Paying Living Expenses: Not hard at all  Food Insecurity: No Food Insecurity   Worried About Charity fundraiser in the Last Year: Never true   Arboriculturist in the Last Year: Never true  Transportation Needs: No Transportation Needs   Lack of Transportation (Medical): No   Lack of Transportation (Non-Medical): No  Physical Activity: Insufficiently Active   Days of Exercise per Week: 3 days   Minutes of Exercise per Session: 20 min  Stress: No Stress Concern Present   Feeling of Stress : Only a little  Social Connections: Moderately Isolated   Frequency of Communication with Friends and Family: More than three times a week  Frequency of Social Gatherings with Friends and Family: More than three times a week   Attends Religious Services: More than 4 times per year   Active Member of Clubs or Organizations: No   Attends Archivist Meetings: Never   Marital Status: Widowed  Human resources officer Violence: Not At Risk   Fear of Current or Ex-Partner: No   Emotionally Abused: No   Physically Abused: No   Sexually Abused: No    Outpatient Medications Prior to Visit  Medication Sig Dispense Refill   acetaminophen (TYLENOL) 650 MG CR tablet Take 1,300 mg by mouth daily as needed for pain (arthritis).      diclofenac Sodium (VOLTAREN) 1 % GEL Apply 2 g topically daily as needed (pain). 150 g 3   diltiazem (CARDIZEM) 60 MG tablet Take 1 tablet (60 mg total) by mouth daily. 30 tablet 3   furosemide  (LASIX) 20 MG tablet Take 3 tablets (60 mg total) by mouth daily. MAY TAKE AN ADDITIONAL TABLET AS NEEDED FOR SWELLING. 270 tablet 2   Glycerin-Hypromellose-PEG 400 ([facility] DRY EYE RELIEF) 0.2-0.2-1 % SOLN Place 1 drop into both eyes daily as needed (Dry eye).     levothyroxine (SYNTHROID) 75 MCG tablet TAKE 1 TABLET(75 MCG) BY MOUTH DAILY 90 tablet 0   NONFORMULARY OR COMPOUNDED ITEM rollator walker  #1  As directed -------  Dx  CHF,  DOE,  OA 1 each 0   potassium chloride SA (KLOR-CON) 20 MEQ tablet Take 1 tablet (20 mEq total) by mouth daily. 90 tablet 3   Rivaroxaban (XARELTO) 15 MG TABS tablet TAKE 1 TABLET(15 MG) BY MOUTH DAILY WITH BREAKFAST. DISCONTINUE ELIQUIS 90 tablet 2   sertraline (ZOLOFT) 50 MG tablet TAKE 1 TABLET(50 MG) BY MOUTH DAILY 90 tablet 3   spironolactone (ALDACTONE) 25 MG tablet Take 1 tablet (25 mg total) by mouth every other day. 45 tablet 3   haloperidol (HALDOL) 1 MG tablet Take 1 tablet (1 mg total) by mouth 3 (three) times daily. 90 tablet 1   nystatin (MYCOSTATIN/NYSTOP) powder Apply 1 application topically 3 (three) times daily. 60 g 3   No facility-administered medications prior to visit.    No Known Allergies  ROS Review of Systems  Constitutional:  Positive for fatigue. Negative for activity change, appetite change and unexpected weight change.  Respiratory:  Negative for cough and shortness of breath.   Cardiovascular:  Negative for chest pain and palpitations.  Psychiatric/Behavioral:  Negative for behavioral problems and dysphoric mood. The patient is not nervous/anxious.      Objective:    Physical Exam Vitals and nursing note reviewed.  Constitutional:      Appearance: She is well-developed.  HENT:     Head: Normocephalic and atraumatic.  Eyes:     Conjunctiva/sclera: Conjunctivae normal.  Neck:     Thyroid: No thyromegaly.     Vascular: No carotid bruit or JVD.  Cardiovascular:     Rate and Rhythm: Normal rate and regular rhythm.     Heart  sounds: Normal heart sounds. No murmur heard. Pulmonary:     Effort: Pulmonary effort is normal. No respiratory distress.     Breath sounds: Normal breath sounds. No wheezing or rales.  Chest:     Chest wall: No tenderness.  Musculoskeletal:     Cervical back: Normal range of motion and neck supple.     Comments: Swelling in hand and ankle much improved   Neurological:     General: No focal deficit  present.     Mental Status: She is alert and oriented to person, place, and time.  Psychiatric:        Mood and Affect: Mood normal.        Behavior: Behavior normal.        Thought Content: Thought content normal.        Judgment: Judgment normal.    BP 108/80 (BP Location: Right Arm, Patient Position: Sitting, Cuff Size: Normal)    Pulse 75    Temp 98 F (36.7 C) (Oral)    Resp 20    Ht _0  (1.702 m)    SpO2 98%    BMI 25.86 kg/m  Wt Readings from Last 3 Encounters:  08/21/21 165 lb 2 oz (74.9 kg)  07/22/21 147 lb 6.4 oz (66.9 kg)  05/07/21 149 lb 6.4 oz (67.8 kg)     Health Maintenance Due  Topic Date Due   FOOT EXAM  Never done   OPHTHALMOLOGY EXAM  Never done   URINE MICROALBUMIN  Never done   TETANUS/TDAP  Never done   Zoster Vaccines- Shingrix (1 of 2) Never done   DEXA SCAN  Never done   MAMMOGRAM  03/28/2013   HEMOGLOBIN A1C  05/26/2021   COVID-19 Vaccine (5 - Booster for [place] series) 06/18/2021    There are no preventive care reminders to display for this patient.  Lab Results  Component Value Date   TSH 1.82 09/13/2021   Lab Results  Component Value Date   WBC 8.0 09/13/2021   HGB 14.8 09/13/2021   HCT 44.9 09/13/2021   MCV 94.5 09/13/2021   PLT 236.0 09/13/2021   Lab Results  Component Value Date   NA 140 09/13/2021   K 4.5 09/13/2021   CO2 23 09/13/2021   GLUCOSE 82 09/13/2021   BUN 25 (H) 09/13/2021   CREATININE 1.69 (H) 09/13/2021   BILITOT 0.8 09/13/2021   ALKPHOS 123 (H) 09/13/2021   AST 14 09/13/2021   ALT 8 09/13/2021   PROT 6.6  09/13/2021   ALBUMIN 4.3 09/13/2021   CALCIUM 9.5 09/13/2021   ANIONGAP 8 08/21/2021   EGFR 28 (L) 07/22/2021   GFR 27.02 (L) 09/13/2021   Lab Results  Component Value Date   CHOL 177 09/13/2021   Lab Results  Component Value Date   HDL 40.30 09/13/2021   Lab Results  Component Value Date   LDLCALC 112 (H) 09/13/2021   Lab Results  Component Value Date   TRIG 128.0 09/13/2021   Lab Results  Component Value Date   CHOLHDL 4 09/13/2021   Lab Results  Component Value Date   HGBA1C 6.1 11/26/2020      Assessment & Plan:   Problem List Items Addressed This Visit       Unprioritized   ACQUIRED HEMOLYTIC ANEMIA UNSPECIFIED   Anxiety    Improved since zoloft restarted      Essential hypertension    Well controlled, no changes to meds. Encouraged heart healthy diet such as the DASH diet and exercise as tolerated.       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    Dispense:  60 g    Refill:  3    Follow-up: Return in about 3 months (around 01/17/2022), or if symptoms worsen or fail to improve.     [name], DO

## 2021-10-19 NOTE — Assessment & Plan Note (Signed)
Pt daughter asking to try PT again

## 2021-10-19 NOTE — Telephone Encounter (Signed)
Arisa called regarding start date for haldol 3 times a day and dc prn for haldol. would like documents faxed to [phone]. any questions please contact [phone]. please advise.

## 2021-10-20 NOTE — Telephone Encounter (Signed)
Arisa called again, to follow up on orders. Please advise.

## 2021-10-20 NOTE — Telephone Encounter (Signed)
Order faxed over with d/c date of prn and start date of the 1tab tid

## 2021-10-20 NOTE — Telephone Encounter (Signed)
Faxed back over to correct number

## 2021-10-20 NOTE — Addendum Note (Signed)
Addended by: Seabron Spates R on: 10/20/2021 12:23 PM   Modules accepted: Orders

## 2021-10-20 NOTE — Telephone Encounter (Signed)
She would like orders faxed to, [phone]. Stated she thought it may have been going to her cell phone instead.

## 2021-10-24 ENCOUNTER — Encounter: Payer: Self-pay | Admitting: Family Medicine

## 2021-10-26 ENCOUNTER — Encounter: Payer: Self-pay | Admitting: Family Medicine

## 2021-10-26 ENCOUNTER — Ambulatory Visit (INDEPENDENT_AMBULATORY_CARE_PROVIDER_SITE_OTHER): Payer: Medicare Other | Admitting: Family Medicine

## 2021-10-26 VITALS — BP 100/80 | HR 66 | Temp 97.7°F | Resp 20 | Ht 67.0 in

## 2021-10-26 DIAGNOSIS — G309 Alzheimer's disease, unspecified: Secondary | ICD-10-CM

## 2021-10-26 DIAGNOSIS — F02818 Dementia in other diseases classified elsewhere, unspecified severity, with other behavioral disturbance: Secondary | ICD-10-CM

## 2021-10-26 DIAGNOSIS — B029 Zoster without complications: Secondary | ICD-10-CM | POA: Diagnosis not present

## 2021-10-26 DIAGNOSIS — Z23 Encounter for immunization: Secondary | ICD-10-CM

## 2021-10-26 MED ORDER — VALACYCLOVIR HCL 1 G PO TABS: 1000.0000 mg | ORAL_TABLET | Freq: Three times a day (TID) | ORAL | 0 refills | Status: AC

## 2021-10-26 MED ORDER — HALOPERIDOL 1 MG PO TABS: 1.0000 mg | ORAL_TABLET | Freq: Two times a day (BID) | ORAL | 1 refills | Status: DC

## 2021-10-26 MED ORDER — ZOSTER VAC RECOMB ADJUVANTED 50 MCG/0.5ML IM SUSR: 0.5000 mL | Freq: Once | INTRAMUSCULAR | 1 refills | Status: AC

## 2021-10-26 MED ORDER — HALOPERIDOL 1 MG PO TABS: 1.0000 mg | ORAL_TABLET | Freq: Three times a day (TID) | ORAL | 1 refills | Status: DC

## 2021-10-26 NOTE — Progress Notes (Signed)
Subjective:   By signing my name below, I, [name], attest that this documentation has been prepared under the direction and in the presence of Dr. Roma Schanz, DO. 10/26/2020    Patient ID: Belinda Anderson, female    DOB: 1932-08-28, 88 y.o.   MRN: [id number]  Chief Complaint  Patient presents with   Rash    Rash is on pt's bottom. Rash was noticed this past weekend. Rash is raised and not itchy.     Rash Pertinent negatives include no congestion, fever or shortness of breath.   Patient is in today for a office visit. She is present with her daughter during this visit.  She complains of a sores on her right buttocks. She reports having no pain. Her daughter reports she complains of pain when sitting down without a pillow for support. She does not have the shingles vaccines.    Past Medical History:  Diagnosis Date   Arthritis    Atrial fibrillation ([place])    Hypertension    Thyroid disease    TIA (transient ischemic attack)     Past Surgical History:  Procedure Laterality Date   CARDIOVERSION N/A 04/15/2020   Procedure: CARDIOVERSION;  Surgeon: Elouise Munroe, MD;  Location: [place];  Service: Cardiovascular;  Laterality: N/A;   EYE SURGERY     lens implant   IR ANGIO VERTEBRAL SEL SUBCLAVIAN INNOMINATE UNI R MOD SED  09/30/2019   IR CT HEAD LTD  09/30/2019   IR PERCUTANEOUS ART THROMBECTOMY/INFUSION INTRACRANIAL INC DIAG ANGIO  09/30/2019   RADIOLOGY WITH ANESTHESIA N/A 09/30/2019   Procedure: IR WITH ANESTHESIA;  Surgeon: Luanne Bras, MD;  Location: [place];  Service: Radiology;  Laterality: N/A;   TEE WITHOUT CARDIOVERSION N/A 04/15/2020   Procedure: TRANSESOPHAGEAL ECHOCARDIOGRAM (TEE);  Surgeon: Elouise Munroe, MD;  Location: [facility] ENDOSCOPY;  Service: Cardiovascular;  Laterality: N/A;    Family History  Problem Relation Age of Onset   Arthritis Mother    Heart disease Mother    Heart disease Father 64       MI    Coronary artery disease Other    Diabetes Brother        DI    Social History   Socioeconomic History   Marital status: Widowed    Spouse name: Not on file   Number of children: 3   Years of education: 80   Highest education level: 12th grade  Occupational History   Not on file  Tobacco Use   Smoking status: Never   Smokeless tobacco: Never  Vaping Use   Vaping Use: Never used  Substance and Sexual Activity   Alcohol use: No   Drug use: No   Sexual activity: Not Currently    Partners: Male  Other Topics Concern   Not on file  Social History Narrative   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, TEFL teacher   Social Determinants of Health   Financial Resource Strain: Low Risk    Difficulty of Paying Living Expenses: Not hard at all  Food Insecurity: No Food Insecurity   Worried About Charity fundraiser in the Last Year: Never true   Arboriculturist in the Last Year: Never true  Transportation Needs: No Transportation Needs   Lack of Transportation (Medical): No   Lack of Transportation (Non-Medical): No  Physical Activity: Insufficiently Active   Days of Exercise per Week: 3 days   Minutes of Exercise per Session: 20 min  Stress: No Stress Concern Present   Feeling of Stress : Only a little  Social Connections: Moderately Isolated   Frequency of Communication with Friends and Family: More than three times a week   Frequency of Social Gatherings with Friends and Family: More than three times a week   Attends Religious Services: More than 4 times per year   Active Member of [employer] or Organizations: No   Attends Archivist Meetings: Never   Marital Status: Widowed  Human resources officer Violence: Not At Risk   Fear of Current or Ex-Partner: No   Emotionally Abused: No   Physically Abused: No   Sexually Abused: No    Outpatient Medications Prior to Visit  Medication Sig Dispense Refill   acetaminophen (TYLENOL)  650 MG CR tablet Take 1,300 mg by mouth daily as needed for pain (arthritis).      diclofenac Sodium (VOLTAREN) 1 % GEL Apply 2 g topically daily as needed (pain). 150 g 3   diltiazem (CARDIZEM) 60 MG tablet Take 1 tablet (60 mg total) by mouth daily. 30 tablet 3   furosemide (LASIX) 20 MG tablet Take 3 tablets (60 mg total) by mouth daily. MAY TAKE AN ADDITIONAL TABLET AS NEEDED FOR SWELLING. 270 tablet 2   Glycerin-Hypromellose-PEG 400 ([facility] DRY EYE RELIEF) 0.2-0.2-1 % SOLN Place 1 drop into both eyes daily as needed (Dry eye).     haloperidol (HALDOL) 1 MG tablet Take 1 tablet (1 mg total) by mouth 3 (three) times daily. 90 tablet 1   levothyroxine (SYNTHROID) 75 MCG tablet TAKE 1 TABLET(75 MCG) BY MOUTH DAILY 90 tablet 0   NONFORMULARY OR COMPOUNDED ITEM rollator walker  #1  As directed -------  Dx  CHF,  DOE,  OA 1 each 0   nystatin (MYCOSTATIN/NYSTOP) powder Apply 1 application topically 3 (three) times daily. 60 g 3   potassium chloride SA (KLOR-CON) 20 MEQ tablet Take 1 tablet (20 mEq total) by mouth daily. 90 tablet 3   Rivaroxaban (XARELTO) 15 MG TABS tablet TAKE 1 TABLET(15 MG) BY MOUTH DAILY WITH BREAKFAST. DISCONTINUE ELIQUIS 90 tablet 2   sertraline (ZOLOFT) 50 MG tablet TAKE 1 TABLET(50 MG) BY MOUTH DAILY 90 tablet 3   spironolactone (ALDACTONE) 25 MG tablet Take 1 tablet (25 mg total) by mouth every other day. 45 tablet 3   No facility-administered medications prior to visit.    No Known Allergies  Review of Systems  Constitutional:  Negative for fever and malaise/fatigue.  HENT:  Negative for congestion.   Eyes:  Negative for blurred vision.  Respiratory:  Negative for shortness of breath.   Cardiovascular:  Negative for chest pain, palpitations and leg swelling.  Gastrointestinal:  Negative for abdominal pain, blood in stool and nausea.  Genitourinary:  Negative for dysuria and frequency.  Musculoskeletal:  Negative for falls.  Skin:  Positive for rash (on  right buttock).  Neurological:  Negative for dizziness, loss of consciousness and headaches.  Endo/Heme/Allergies:  Negative for environmental allergies.  Psychiatric/Behavioral:  Negative for depression. The patient is not nervous/anxious.       Objective:    Physical Exam Vitals and nursing note reviewed.  Constitutional:      Appearance: She is well-developed.  HENT:     Head: Normocephalic and atraumatic.  Eyes:     Conjunctiva/sclera: Conjunctivae normal.  Neck:     Thyroid: No thyromegaly.     Vascular: No carotid bruit or JVD.  Cardiovascular:     Rate and Rhythm:  Normal rate and regular rhythm.     Heart sounds: Normal heart sounds. No murmur heard. Pulmonary:     Effort: Pulmonary effort is normal. No respiratory distress.     Breath sounds: Normal breath sounds. No wheezing or rales.  Chest:     Chest wall: No tenderness.  Musculoskeletal:     Cervical back: Normal range of motion and neck supple.  Skin:    Findings: Rash present.     Comments: Blisters on lower back and right buttock  c/w shingles   Neurological:     Mental Status: She is alert and oriented to person, place, and time.    BP 100/80 (BP Location: Left Arm, Patient Position: Sitting, Cuff Size: Normal)    Pulse 66    Temp 97.7 F (36.5 C) (Oral)    Resp 20    Ht _0  (1.702 m)    SpO2 98%    BMI 25.86 kg/m  Wt Readings from Last 3 Encounters:  08/21/21 165 lb 2 oz (74.9 kg)  07/22/21 147 lb 6.4 oz (66.9 kg)  05/07/21 149 lb 6.4 oz (67.8 kg)    Diabetic Foot Exam - Simple   No data filed    Lab Results  Component Value Date   WBC 8.0 09/13/2021   HGB 14.8 09/13/2021   HCT 44.9 09/13/2021   PLT 236.0 09/13/2021   GLUCOSE 82 09/13/2021   CHOL 177 09/13/2021   TRIG 128.0 09/13/2021   HDL 40.30 09/13/2021   LDLDIRECT 97.0 04/06/2018   LDLCALC 112 (H) 09/13/2021   ALT 8 09/13/2021   AST 14 09/13/2021   NA 140 09/13/2021   K 4.5 09/13/2021   CL 104 09/13/2021   CREATININE 1.69 (H)  09/13/2021   BUN 25 (H) 09/13/2021   CO2 23 09/13/2021   TSH 1.82 09/13/2021   INR 1.6 (H) 02/04/2021   HGBA1C 6.1 11/26/2020    Lab Results  Component Value Date   TSH 1.82 09/13/2021   Lab Results  Component Value Date   WBC 8.0 09/13/2021   HGB 14.8 09/13/2021   HCT 44.9 09/13/2021   MCV 94.5 09/13/2021   PLT 236.0 09/13/2021   Lab Results  Component Value Date   NA 140 09/13/2021   K 4.5 09/13/2021   CO2 23 09/13/2021   GLUCOSE 82 09/13/2021   BUN 25 (H) 09/13/2021   CREATININE 1.69 (H) 09/13/2021   BILITOT 0.8 09/13/2021   ALKPHOS 123 (H) 09/13/2021   AST 14 09/13/2021   ALT 8 09/13/2021   PROT 6.6 09/13/2021   ALBUMIN 4.3 09/13/2021   CALCIUM 9.5 09/13/2021   ANIONGAP 8 08/21/2021   EGFR 28 (L) 07/22/2021   GFR 27.02 (L) 09/13/2021   Lab Results  Component Value Date   CHOL 177 09/13/2021   Lab Results  Component Value Date   HDL 40.30 09/13/2021   Lab Results  Component Value Date   LDLCALC 112 (H) 09/13/2021   Lab Results  Component Value Date   TRIG 128.0 09/13/2021   Lab Results  Component Value Date   CHOLHDL 4 09/13/2021   Lab Results  Component Value Date   HGBA1C 6.1 11/26/2020       Assessment & Plan:   Problem List Items Addressed This Visit   None    No orders of the defined types were placed in this encounter.   I, Dr. Roma Schanz, DO, personally preformed the services described in this documentation.  All medical record entries made  by the scribe were at my direction and in my presence.  I have reviewed the chart and discharge instructions (if applicable) and agree that the record reflects my personal performance and is accurate and complete. 10/26/2021   I,[name],acting as a scribe for [name], DO.,have documented all relevant documentation on the behalf of [name], DO,as directed by  [name], DO while in the presence of [name], DO.   Shehryar  [employer]

## 2021-10-26 NOTE — Patient Instructions (Signed)
Shingles ?Shingles, which is also known as herpes zoster, is an infection that causes a painful skin rash and fluid-filled blisters. It is caused by a virus. ?Shingles only develops in people who: ?Have had chickenpox. ?Have been vaccinated against chickenpox. Shingles is rare in this group. ?What are the causes? ?Shingles is caused by varicella-zoster virus. This is the same virus that causes chickenpox. After a person is exposed to the virus, it stays in the body in an inactive (dormant) state. Shingles develops if the virus is reactivated. This can happen many years after the first (initial) exposure to the virus. It is not known what causes this virus to be reactivated. ?What increases the risk? ?People who have had chickenpox or received the chickenpox vaccine are at risk for shingles. Shingles infection is more common in people who: ?Are older than 86 years of age. ?Have a weakened disease-fighting system (immune system), such as people with: ?HIV (human immunodeficiency virus). ?AIDS (acquired immunodeficiency syndrome). ?Cancer. ?Are taking medicines that weaken the immune system, such as organ transplant medicines. ?Are experiencing a lot of stress. ?What are the signs or symptoms? ?Early symptoms of this condition include itching, tingling, and pain in an area on your skin. Pain may be described as burning, stabbing, or throbbing. ?A few days or weeks after early symptoms start, a painful red rash appears. The rash is usually on one side of the body and has a band-like or belt-like pattern. The rash eventually turns into fluid-filled blisters that break open, change into scabs, and dry up in about 2-3 weeks. ?At any time during the infection, you may also develop: ?A fever. ?Chills. ?A headache. ?Nausea. ?How is this diagnosed? ?This condition is diagnosed with a skin exam. Skin or fluid samples (a culture) may be taken from the blisters before a diagnosis is made. ?How is this treated? ?The rash may last  for several weeks. There is not a specific cure for this condition. Your health care provider may prescribe medicines to help you manage pain, recover more quickly, and avoid long-term problems. Medicines may include: ?Antiviral medicines. ?Anti-inflammatory medicines. ?Pain medicines. ?Anti-itching medicines (antihistamines). ?If the area involved is on your face, you may be referred to a specialist, such as an eye doctor (ophthalmologist) or an ear, nose, and throat (ENT) doctor (otorhinolaryngologist) to help you avoid eye problems, chronic pain, or disability. ?Follow these instructions at home: ?Medicines ?Take over-the-counter and prescription medicines only as told by your health care provider. ?Apply an anti-itch cream or numbing cream to the affected area as told by your health care provider. ?Relieving itching and discomfort ? ?Apply cold, wet cloths (cold compresses) to the area of the rash or blisters as told by your health care provider. ?Cool baths can be soothing. Try adding baking soda or dry oatmeal to the water to reduce itching. Do not bathe in hot water. ?Use calamine lotion as recommended by your health care provider. This is an over-the-counter lotion that helps to relieve itchiness. ?Blister and rash care ?Keep your rash covered with a loose bandage (dressing). Wear loose-fitting clothing to help ease the pain of material rubbing against the rash. ?Wash your hands with soap and water for at least 20 seconds before and after you change your dressing. If soap and water are not available, use hand sanitizer. ?Change your dressing as told by your health care provider. ?Keep your rash and blisters clean by washing the area with mild soap and cool water as told by your health   care provider. ?Check your rash every day for signs of infection. Check for: ?More redness, swelling, or pain. ?Fluid or blood. ?Warmth. ?Pus or a bad smell. ?Do not scratch your rash or pick at your blisters. To help avoid  scratching: ?Keep your fingernails clean and cut short. ?Wear gloves or mittens while you sleep, if scratching is a problem. ?General instructions ?Rest as told by your health care provider. ?Wash your hands often with soap and water for at least 20 seconds. If soap and water are not available, use hand sanitizer. Doing this lowers your chance of getting a bacterial skin infection. ?Before your blisters change into scabs, your shingles infection can cause chickenpox in people who have never had it or have never been vaccinated against it. To prevent this from happening, avoid contact with other people, especially: ?Babies. ?Pregnant women. ?Children who have eczema. ?Older people who have transplants. ?People who have chronic illnesses, such as cancer or AIDS. ?Keep all follow-up visits. This is important. ?How is this prevented? ?Getting vaccinated is the best way to prevent shingles and protect against shingles complications. If you have not been vaccinated, talk with your health care provider about getting the vaccine. ?Where to find more information ?Centers for Disease Control and Prevention: www.cdc.gov ?Contact a health care provider if: ?Your pain is not relieved with prescribed medicines. ?Your pain does not get better after the rash heals. ?You have any of these signs of infection: ?More redness, swelling, or pain around the rash. ?Fluid or blood coming from the rash. ?Warmth coming from your rash. ?Pus or a bad smell coming from the rash. ?A fever. ?Get help right away if: ?The rash is on your face or nose. ?You have facial pain, pain around your eye area, or loss of feeling on one side of your face. ?You have difficulty seeing. ?You have ear pain or have ringing in your ear. ?You have a loss of taste. ?Your condition gets worse. ?Summary ?Shingles, also known as herpes zoster, is an infection that causes a painful skin rash and fluid-filled blisters. ?This condition is diagnosed with a skin exam. Skin or  fluid samples (a culture) may be taken from the blisters. ?Keep your rash covered with a loose bandage (dressing). Wear loose-fitting clothing to help ease the pain of material rubbing against the rash. ?Before your blisters change into scabs, your shingles infection can cause chickenpox in people who have never had it or have never been vaccinated against it. ?This information is not intended to replace advice given to you by your health care provider. Make sure you discuss any questions you have with your health care provider. ?Document Revised: 09/28/2020 Document Reviewed: 09/28/2020 ?Elsevier Patient Education ? 2022 Elsevier Inc. ? ?

## 2021-10-28 DIAGNOSIS — R296 Repeated falls: Secondary | ICD-10-CM | POA: Insufficient documentation

## 2021-10-28 NOTE — Assessment & Plan Note (Signed)
Per cardiology 

## 2021-10-28 NOTE — Assessment & Plan Note (Signed)
Still with weakness Pt referral placed

## 2021-10-28 NOTE — Assessment & Plan Note (Signed)
Stable per cardiology.

## 2021-10-28 NOTE — Assessment & Plan Note (Signed)
Pt referral placed

## 2021-11-02 ENCOUNTER — Telehealth: Payer: Self-pay | Admitting: Family Medicine

## 2021-11-02 ENCOUNTER — Other Ambulatory Visit: Payer: Self-pay | Admitting: Family Medicine

## 2021-11-02 DIAGNOSIS — L89159 Pressure ulcer of sacral region, unspecified stage: Secondary | ICD-10-CM

## 2021-11-02 NOTE — Telephone Encounter (Signed)
Pt's daughter called and stated pt has an open wound on her tailbone. The nurses at [facility] need to know if Dr. Etter Sjogren wants to put in orders for Home health, since the nursing facility does not usually deal with open wounds. Please advise. Fax #: [phone]

## 2021-11-15 ENCOUNTER — Other Ambulatory Visit: Payer: Self-pay

## 2021-11-15 ENCOUNTER — Non-Acute Institutional Stay: Payer: Medicare Other

## 2021-11-15 DIAGNOSIS — Z515 Encounter for palliative care: Secondary | ICD-10-CM

## 2021-11-15 NOTE — Progress Notes (Signed)
COMMUNITY PALLIATIVE CARE SW NOTE  PATIENT NAME: Belinda Anderson DOB: [birth year] MRN: [id number]  PRIMARY CARE PROVIDER: [name], DO  RESPONSIBLE PARTY:  Acct ID - Guarantor Home Phone Work Phone Relationship Acct Type  [id number] [name], [name]  Self P/F     [address], [place],  [address]     PLAN OF CARE and INTERVENTIONS:             GOALS OF CARE/ ADVANCE CARE PLANNING:  Goal is for patient to remain in the memory care facility as safe as possible. Patient is a DNR SOCIAL/EMOTIONAL/SPIRITUAL ASSESSMENT/ INTERVENTIONS:  SW and RN-B. Senor (virtually) completed a joint visit with patient at [facility] facility. Patient was in the model room, visiting with her best friend, brother and sister-in-law. Patient was cordial and was engaged with SW. Memory deficits were present as she had difficulty recalling words or social history and looked to her family to assist her. Patient denied pain. SW observed some swelling in her right hand as it was also red in color.  Her brother report that the swelling appeared to be much better. Patient ambulates with her walker independently. Patient and staff report that her appetite is good. Patient has current weight of 146 lbs. Patient's family report that patient seemed agitated when they arrived and they asked for her to have her Haldol. The med tech was consulted and patient no longer has a  PRN Haldol; it is ordered 2x/day and patient had received what was ordered. The med tech reported that patient is having increased intermittent agitation that is displayed as increased  shortness breath and pacing. The staff report that patient was having increased SOB today where they had to take her lunch in the wheelchair. Patient is in a temporary room that is further away from the dinning room.  Patient's brother advised that patient was recovering from shingles and seemed to be doing better. Patient affirmed  that she no longer have shingles or the pain associated with it. Patient verbalized no concerns  and stated to the team several times "I'm alright". SW provided assessment of patient needs and comfort, supportive presence, social stimulation and consultation with staff. PATIENT/CAREGIVER EDUCATION/ COPING:  Patient appears to be coping well. She was in good spirits and was engaged with SW. Patient has a supportive family. PERSONAL EMERGENCY PLAN:  Per facility protocol. COMMUNITY RESOURCES COORDINATION/ HEALTH CARE NAVIGATION:  None. FINANCIAL/LEGAL CONCERNS/INTERVENTIONS:  None.     SOCIAL HX:  Social History   Tobacco Use   Smoking status: Never   Smokeless tobacco: Never  Substance Use Topics   Alcohol use: No    CODE STATUS: DNR ADVANCED DIRECTIVES: Yes MOST FORM COMPLETE:  No HOSPICE EDUCATION PROVIDED: No  PPS: Patient is alert and oriented x3 with some memory deficits. Patient ambulates with a walker. SW observed some swelling in patient's right hand. Staff report patient is having increased intermittent SOB and agitation.   Duration of visit and documentation: 30 minutes  Katheren Puller, LCSW

## 2021-11-18 NOTE — Progress Notes (Signed)
PATIENT NAME: Belinda Anderson DOB: 06-23-1932 MRN: [id number]  PRIMARY CARE PROVIDER: [name], DO  RESPONSIBLE PARTY:  Acct ID - Guarantor Home Phone Work Phone Relationship Acct Type  [id number] Belinda Anderson, Belinda Anderson* [phone]  Self P/F     [address], [place], [place] [address]  COMMUNITY PALLIATIVE CARE RN NOTE    PLAN OF CARE and INTERVENTION:  ADVANCE CARE PLANNING/GOALS OF CARE: Remain in Memory care, DNR, comfort, safety PATIENT/CAREGIVER EDUCATION: Palliative care DISEASE STATUS: Virtually connected with [facility]-Palliative care SW who was present at facility. Patient's brother, sister-in-law, and best friend. Patient is awake and verbally responsive to questions, although cognitive impairment observed as evidenced by difficulty finding words and relying on family for assistance. Patient has history of anxiety and is displayed by restlessness and respirations become quick. Brother asked for patient to have her prescribed Haldol. Med tech shared that frequency was decreased to BID from TID. Meal intake is good, and weight has remained stable. Patient utilized w/c today. She is currently staying in the model room and is a further distance from dining room. Patient had recently had dx of Shingles, now resolved. Patient denies any residual pain from shingles. Right hand swelling noted, arthritis present in hand. No new concerns verbalized. HISTORY OF PRESENT ILLNESS: This is a 88 year old female with diagnosis of Dementia, Vitamin D deficiency and constipation. Palliative care has been asked to follow for additional support, care and complex decision making.   CODE STATUS: DNR PPS: 40%     PHYSICAL EXAM: deferred    Duration:       Denton Meek, BSN  Estanislado Pandy, RN

## 2021-11-20 NOTE — Progress Notes (Signed)
Cardiology Office Note Date:  11/22/2021  Patient ID:  Belinda Anderson, Belinda Anderson [birth year], MRN [id number] PCP:  [name], [name], DO  Electrophysiologist: Dr. Curt Bears    Chief Complaint:   planned f/u  History of Present Illness: Belinda Anderson is a 88 y.o. female with history of HTN, HLD, hypothyroid,  stroke, permanent Afib, chronic CHF (diastolic), CKD (III), depression, dementia.  She was admitted to [facility] 02/04/21 with acute/chronic CHF manged with the IM team. BNP of 306, troponin negative x 2.  EKG shows A. Fib,  rate controlled no ST-T wave changes. She was admitted for acute on chronic diastolic heart failure exacerbation.  She is also found to have a UTI and started on IV ceftriaxone. Patient continued to have clinical improvement, Lasix deescalated to home dosing. UTI treated. Her mentation returned to her baseline dementia.  Discharged 02/08/21  She saw Dr. Charlett Blake the day after admission, noting family having increasing difficulty in caring for the pt at home, needs to consider SNF.   L foot was painful and swollen planned for abx for cellulitis.  Plans for home health RN, PT, OT eval with steps towards placement  I saw her 02/16/21 She is accompanied by her daughter today PT has helped, walking with a walker, doing a bit better. She sleeps in bed with one pillow, no reports of symptoms of PND or orthopnea by the pt or her daughter though her daughter says her sister reports that in the morning she has a congested cough that will clear All in all breathing is much better, edema and abd bloating are much better as well Gained 1.5lbs from yesterday L foot erythema and swelling is resolved to her baseline, reports ultimately was gout and treated with colchicine and the antibiotic with some diarrhea resulting Both completed now and hopefully bowel regime will return to normal No near syncope or syncope No bleeding or signs of bleeding They have a CNA during the day to help along with  family and family is with her nights We reviewed daily weights, instructions on use of a PRN lasix dose, planned for 2 days of this with some weight gain and trace edema at her visit, and another early follow visit. BMET this day was stable   And again 03/01/21 She is again accompanied by her daughter Generally about the same. Her weight at home fluctuating, will gain 3 pounds in a day or so the extra lasix helps and comes back down though within a few days again starts to creep back up. They are quite diligent about low sodium food for her Her PT last week noted crackles in L lung and PMD had a CXR done though unknown result She has SOB intermittently her daughter suspects may be a component of anxiety as well. Today gave her a Haldol and helps her. No CP, palpitations or cardiac awareness are reported No near fainting or fainting Sleep is interrupted with need to urinate, no reports of symptoms of PND or orthopnea No bleeding or signs of bleeding She has excellent family support and an aid to help them at home as well.  Is living with her other daughter now. added spironolactone, Reduced her Kdur in 1/2 to one tab daily Planned for labs and early follow up  She saw Dr. Curt Bears 04/01/21, not clearly volume OL and did not think CHF explained all of her SOB, recommended discussion with her PMD for consideration of pulmonary evaluation.  At her last PMD visit, did not c/o  SOB, concerns were of some wounds she had.  I saw her 07/22/21 Again accompanied by her daughter Planning transition to senior/assisted living soon. Doing well at home overall, still seems to get SOB sometimes without clear provocation, of late noteed after dinner. She walks in the house with her rolling walker, takes the cat for walks around the house in the basket Generally doing OK with this No near syncope or syncope No CP, palpitations, cardiac awareness Weight is stable at home No bleeding or signs of  bleeding Discussed: Revisit the idea of pulmonary evaluation via her PMD Perhaps component of deconditioning Encourage to straighten up when seated and take nice deep breaths periodically They preferred to keep Q20mo check in visits  TODAY She is in memory care at [facility] now, comes accompanied by her daughter She is doing fairly well. She still gets SOB with minimal exertion, no CP, no syncope, no cardiac awareness. She has no rest SOB or symptoms In d/w the patient/her daughter, not too inclined to press hard with medicines, she is quite comfortable at rest and will stop/rest as she needs to.  No bleeding or signs of bleeding She is completely incontinent of urine now, and reports that her PMD has referred her to a nephrologist after her last labs   Past Medical History:  Diagnosis Date   Arthritis    Atrial fibrillation ([facility])    Hypertension    Thyroid disease    TIA (transient ischemic attack)     Past Surgical History:  Procedure Laterality Date   CARDIOVERSION N/A 04/15/2020   Procedure: CARDIOVERSION;  Surgeon: Elouise Munroe, MD;  Location: [facility];  Service: Cardiovascular;  Laterality: N/A;   EYE SURGERY     lens implant   IR ANGIO VERTEBRAL SEL SUBCLAVIAN INNOMINATE UNI R MOD SED  09/30/2019   IR CT HEAD LTD  09/30/2019   IR PERCUTANEOUS ART THROMBECTOMY/INFUSION INTRACRANIAL INC DIAG ANGIO  09/30/2019   RADIOLOGY WITH ANESTHESIA N/A 09/30/2019   Procedure: IR WITH ANESTHESIA;  Surgeon: Luanne Bras, MD;  Location: [facility];  Service: Radiology;  Laterality: N/A;   TEE WITHOUT CARDIOVERSION N/A 04/15/2020   Procedure: TRANSESOPHAGEAL ECHOCARDIOGRAM (TEE);  Surgeon: Elouise Munroe, MD;  Location: [facility] ENDOSCOPY;  Service: Cardiovascular;  Laterality: N/A;    Current Outpatient Medications  Medication Sig Dispense Refill   acetaminophen (TYLENOL) 650 MG CR tablet Take 1,300 mg by mouth daily as needed for pain (arthritis).      diclofenac Sodium  (VOLTAREN) 1 % GEL Apply 2 g topically daily as needed (pain). 150 g 3   diltiazem (CARDIZEM) 60 MG tablet Take 1 tablet (60 mg total) by mouth daily. 30 tablet 3   furosemide (LASIX) 20 MG tablet Take 3 tablets (60 mg total) by mouth daily. MAY TAKE AN ADDITIONAL TABLET AS NEEDED FOR SWELLING. 270 tablet 2   Glycerin-Hypromellose-PEG 400 ([employer] DRY EYE RELIEF) 0.2-0.2-1 % SOLN Place 1 drop into both eyes daily as needed (Dry eye).     haloperidol (HALDOL) 1 MG tablet Take 1 tablet (1 mg total) by mouth 2 (two) times daily. 60 tablet 1   levothyroxine (SYNTHROID) 75 MCG tablet TAKE 1 TABLET(75 MCG) BY MOUTH DAILY 90 tablet 0   NONFORMULARY OR COMPOUNDED ITEM rollator walker  #1  As directed -------  Dx  CHF,  DOE,  OA 1 each 0   nystatin (MYCOSTATIN/NYSTOP) powder Apply 1 application topically 3 (three) times daily. 60 g 3   potassium chloride SA (KLOR-CON)  20 MEQ tablet Take 1 tablet (20 mEq total) by mouth daily. 90 tablet 3   Rivaroxaban (XARELTO) 15 MG TABS tablet TAKE 1 TABLET(15 MG) BY MOUTH DAILY WITH BREAKFAST. DISCONTINUE ELIQUIS 90 tablet 2   sertraline (ZOLOFT) 50 MG tablet TAKE 1 TABLET(50 MG) BY MOUTH DAILY 90 tablet 3   spironolactone (ALDACTONE) 25 MG tablet Take 1 tablet (25 mg total) by mouth every other day. 45 tablet 3   No current facility-administered medications for this visit.    Allergies:   Patient has no known allergies.   Social History:  The patient  reports that she has never smoked. She has never used smokeless tobacco. She reports that she does not drink alcohol and does not use drugs.   Family History:  The patient's family history includes Arthritis in her mother; Coronary artery disease in an other family member; Diabetes in her brother; Heart disease in her mother; Heart disease (age of onset: 27) in her father.  ROS:  Please see the history of present illness.    All other systems are reviewed and otherwise negative.   PHYSICAL EXAM:  VS:  BP (!) 118/52 (BP  Location: Right Arm, Patient Position: Sitting, Cuff Size: Normal)    Pulse 75    Ht 5\' 7"  (1.702 m)    Wt 147 lb (66.7 kg)    SpO2 97%    BMI 23.02 kg/m  BMI: Body mass index is 23.02 kg/m. Well nourished, well developed, in no acute distress HEENT: normocephalic, atraumatic Neck: no JVD, carotid bruits or masses Cardiac:  irreg-irreg; no significant murmurs, no rubs, or gallops Lungs:  CTA b/l, no wheezing, rhonchi or rales Abd: soft, nontender MS: no deformity, age appropriate atrophy Ext: no edema Skin: warm and dry, no rash Neuro:  No gross deficits appreciated Psych: euthymic mood, full affect   EKG:  Not done today   02/05/21: TTE IMPRESSIONS   1. Left ventricular systolic function is normal. EF 55-60%. No regional  wall motion abnormalities. There is mild focal hypertrophy of the basilar  septum. Diastolic function is indeterminate due to atrial fibrillation.  Findings suggestive of restrictive  cardiomyopathy.   2. Right ventricular systolic function is mildly reduced. The right  ventricular size is moderately enlarged. There is normal pulmonary artery  systolic pressure.   3. Left atrial size was severely dilated. (53mm)  4. Right atrial size was severely dilated.   5. The mitral valve is normal in structure. Mild mitral valve  regurgitation. No evidence of mitral stenosis.   6. Tricuspid valve regurgitation is severe.   7. The aortic valve is tricuspid. There is moderate calcification of the  aortic valve. Aortic valve regurgitation is not visualized. No aortic  stenosis is present.   8. The inferior vena cava is normal in size with greater than 50%  respiratory variability, suggesting right atrial pressure of 3 mmHg.    Recent Labs: 11/26/2020: Pro B Natriuretic peptide (BNP) 411.0 02/04/2021: B Natriuretic Peptide 305.9 02/05/2021: Magnesium 2.0 09/13/2021: ALT 8; BUN 25; Creatinine, Ser 1.69; Hemoglobin 14.8; Platelets 236.0; Potassium 4.5; Sodium 140; TSH 1.82   09/13/2021: Cholesterol 177; HDL 40.30; LDL Cholesterol 112; Total CHOL/HDL Ratio 4; Triglycerides 128.0; VLDL 25.6   CrCl cannot be calculated (Patient's most recent lab result is older than the maximum 21 days allowed.).   Wt Readings from Last 3 Encounters:  11/22/21 147 lb (66.7 kg)  08/21/21 165 lb 2 oz (74.9 kg)  07/22/21 147 lb 6.4 oz (66.9  kg)     Other studies reviewed: Additional studies/records reviewed today include: summarized above  ASSESSMENT AND PLAN:  1. Permanent AFib     CHAD2DS2Vasc is 7, on xarelto, appropriately dosed at 15mg  daily     LA described as severely dilated, measured 87mm     controlled rate by her visits  2. Chronic CHF (diastolic, ?restrictive) 3. Mild RV failue, dilated RV 4. VHD, severe TR     appears Euvolemic today  In d/w them, as long as she is comfortable, not too inclined to pursue stronger, more medicines, testing She does not look volume OL VSS and not having any rest/nocturnal symptoms  5. HTN     Looks ok      Disposition: she sees her PMD regularly, avery 72mo or more, we can see her in 17mo, sooner if needed   Current medicines are reviewed at length with the patient today.  The patient did not have any concerns regarding medicines.  Venetia Night, PA-C 11/22/2021 11:56 AM     [facility] [place] [address] [phone] (office)  [phone] (fax)

## 2021-11-22 ENCOUNTER — Encounter: Payer: Self-pay | Admitting: Physician Assistant

## 2021-11-22 ENCOUNTER — Ambulatory Visit: Payer: Medicare Other | Admitting: Physician Assistant

## 2021-11-22 ENCOUNTER — Other Ambulatory Visit: Payer: Self-pay

## 2021-11-22 VITALS — BP 118/52 | HR 75 | Ht 67.0 in | Wt 147.0 lb

## 2021-11-22 DIAGNOSIS — I5042 Chronic combined systolic (congestive) and diastolic (congestive) heart failure: Secondary | ICD-10-CM

## 2021-11-22 DIAGNOSIS — I4821 Permanent atrial fibrillation: Secondary | ICD-10-CM | POA: Diagnosis not present

## 2021-11-22 DIAGNOSIS — I10 Essential (primary) hypertension: Secondary | ICD-10-CM

## 2021-11-22 NOTE — Patient Instructions (Signed)

## 2021-12-06 ENCOUNTER — Telehealth: Payer: Self-pay

## 2021-12-06 NOTE — Telephone Encounter (Signed)
Orders received and placed in folder for sig

## 2021-12-06 NOTE — Telephone Encounter (Signed)
Belinda Anderson with [facility] called to follow up on the Personal Service Plan that was sent over for Dr. Laury Axon to review and sign.  I did provide to her the fax number in the clinical area in case the previously faxed PSP was not received.

## 2021-12-13 ENCOUNTER — Encounter: Payer: Self-pay | Admitting: Family Medicine

## 2021-12-13 DIAGNOSIS — B0239 Other herpes zoster eye disease: Secondary | ICD-10-CM

## 2021-12-14 ENCOUNTER — Other Ambulatory Visit: Payer: Self-pay | Admitting: Family Medicine

## 2021-12-14 MED ORDER — CEPHALEXIN 500 MG PO CAPS: 500.0000 mg | ORAL_CAPSULE | Freq: Two times a day (BID) | ORAL | 0 refills | Status: DC

## 2021-12-23 NOTE — Telephone Encounter (Signed)
Arlisa called for update on personal service plan  ? ?Forms were faxed again on 3/6 ? ?Please advise  ?[phone] ?

## 2021-12-27 NOTE — Telephone Encounter (Signed)
Faxed back to today ?

## 2022-01-06 ENCOUNTER — Telehealth: Payer: Self-pay | Admitting: Family Medicine

## 2022-01-06 ENCOUNTER — Other Ambulatory Visit: Payer: Self-pay

## 2022-01-06 DIAGNOSIS — R300 Dysuria: Secondary | ICD-10-CM

## 2022-01-06 NOTE — Telephone Encounter (Signed)
Pt's daughter thinks her mom has a UTI and would like to come by to pick up urine sample cup. Please advise  ?

## 2022-01-06 NOTE — Telephone Encounter (Signed)
Spoke with patient's daughter. Cup placed at the front and orders placed ?

## 2022-01-07 ENCOUNTER — Other Ambulatory Visit (INDEPENDENT_AMBULATORY_CARE_PROVIDER_SITE_OTHER): Payer: Medicare Other

## 2022-01-07 DIAGNOSIS — R300 Dysuria: Secondary | ICD-10-CM

## 2022-01-07 LAB — POC URINALSYSI DIPSTICK (AUTOMATED)
Blood, UA: NEGATIVE
Glucose, UA: NEGATIVE
Ketones, UA: NEGATIVE
Leukocytes, UA: NEGATIVE
Nitrite, UA: NEGATIVE
Protein, UA: POSITIVE — AB
Spec Grav, UA: 1.015 (ref 1.010–1.025)
Urobilinogen, UA: 1 E.U./dL
pH, UA: 7 (ref 5.0–8.0)

## 2022-01-09 LAB — URINE CULTURE
MICRO NUMBER:: 13176544
SPECIMEN QUALITY:: ADEQUATE

## 2022-01-12 ENCOUNTER — Ambulatory Visit: Payer: Medicare Other | Admitting: Podiatry

## 2022-01-12 ENCOUNTER — Encounter: Payer: Self-pay | Admitting: Podiatry

## 2022-01-12 DIAGNOSIS — M79609 Pain in unspecified limb: Secondary | ICD-10-CM | POA: Diagnosis not present

## 2022-01-12 DIAGNOSIS — B351 Tinea unguium: Secondary | ICD-10-CM | POA: Diagnosis not present

## 2022-01-12 DIAGNOSIS — D689 Coagulation defect, unspecified: Secondary | ICD-10-CM

## 2022-01-12 NOTE — Progress Notes (Signed)
? ?  ? ?HPI: Patient is 88 y.o. female who presents today for elongated and thickened toenails which are difficult to trim.  They are painful in shoes and ambulation when they become this long.  She is also on a blood thinner.  She presents today with her caregiver. ? ?Patient Active Problem List  ? Diagnosis Date Noted  ? Frequent falls 10/28/2021  ? Joint swelling 09/13/2021  ? Friction blisters of the skin 09/13/2021  ? Pressure ulcer of sacral region, stage 1 04/23/2021  ? Intertrigo 04/23/2021  ? Abnormal urine odor 04/23/2021  ? Left foot pain 02/10/2021  ? Weakness 02/10/2021  ? Urinary tract infection without hematuria 02/10/2021  ? Cellulitis 02/10/2021  ? Dementia ([place]) 02/10/2021  ? Gout 02/10/2021  ? SOB (shortness of breath) 02/05/2021  ? CKD (chronic kidney disease), stage III ([place]) 02/05/2021  ? Anxiety 12/24/2020  ? Impacted cerumen of right ear 12/24/2020  ? Acute on chronic diastolic CHF (congestive heart failure) ([place]) 11/26/2020  ? Bilateral leg edema 01/30/2020  ? COVID-19 virus infection 11/21/2019  ? Cough 11/21/2019  ? External hemorrhoid, bleeding 10/12/2019  ? AF (paroxysmal atrial fibrillation) ([place]) 10/02/2019  ? 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 ([place]) s/p IR R MCA M2 09/30/2019  ? Middle cerebral artery embolism, right 09/30/2019  ? Contusion of foot including toes, right, initial encounter 08/10/2018  ? Primary osteoarthritis of right knee 07/31/2018  ? Right leg swelling 07/17/2018  ? Pain of right calf 07/17/2018  ? Preventative health care 09/18/2017  ? Low back pain radiating to left leg 11/28/2016  ? Hyperlipidemia 02/02/2015  ? Sciatica 07/16/2014  ? Knee pain, acute 07/16/2014  ? Lower extremity pain, right 07/16/2014  ? Skin infection 07/16/2014  ? Incontinence of urine in female 06/19/2013  ? Cerebral aneurysm rupture ([place]) 04/08/2013  ? LEG EDEMA, RIGHT 07/06/2010  ? OTHER DRUG ALLERGY 07/06/2010  ? DIARRHEA 02/12/2010  ? HEMATURIA, HX OF 11/24/2009  ?  ANEURYSM, HX OF 08/03/2009  ? B12 deficiency 07/24/2009  ? ACQUIRED HEMOLYTIC ANEMIA UNSPECIFIED 07/24/2009  ? SYNCOPE 07/20/2009  ? DIZZINESS 07/20/2009  ? Memory loss 07/20/2009  ? 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 Hypothyroidism 12/14/2007  ? Essential hypertension 12/14/2007  ? DIVERTICULITIS, HX OF 12/14/2007  ? GOITER 01/30/2007  ? HYPERTENSION, BENIGN 01/30/2007  ? UTERINE POLYP 01/30/2007  ? THYROIDECTOMY, HX OF 01/30/2007  ? ? ?Current Outpatient Medications on File Prior to Visit  ?Medication Sig Dispense Refill  ? acetaminophen (TYLENOL) 650 MG CR tablet Take 1,300 mg by mouth daily as needed for pain (arthritis).     ? cephALEXin (KEFLEX) 500 MG capsule Take 1 capsule (500 mg total) by mouth 2 (two) times daily. 20 capsule 0  ? diclofenac Sodium (VOLTAREN) 1 % GEL Apply 2 g topically daily as needed (pain). 150 g 3  ? diltiazem (CARDIZEM) 60 MG tablet Take 1 tablet (60 mg total) by mouth daily. 30 tablet 3  ? furosemide (LASIX) 20 MG tablet Take 3 tablets (60 mg total) by mouth daily. MAY TAKE AN ADDITIONAL TABLET AS NEEDED FOR SWELLING. 270 tablet 2  ? Glycerin-Hypromellose-PEG 400 ([facility] DRY EYE RELIEF) 0.2-0.2-1 % SOLN Place 1 drop into both eyes daily as needed (Dry eye).    ? haloperidol (HALDOL) 1 MG tablet Take 1 tablet (1 mg total) by mouth 2 (two) times daily. 60 tablet 1  ? levothyroxine (SYNTHROID) 75 MCG tablet TAKE 1 TABLET(75 MCG) BY MOUTH DAILY 90 tablet  0  ? NONFORMULARY OR COMPOUNDED ITEM rollator walker  #1  As directed -------  Dx  CHF,  DOE,  OA 1 each 0  ? nystatin (MYCOSTATIN/NYSTOP) powder Apply 1 application topically 3 (three) times daily. 60 g 3  ? potassium chloride SA (KLOR-CON) 20 MEQ tablet Take 1 tablet (20 mEq total) by mouth daily. 90 tablet 3  ? Rivaroxaban (XARELTO) 15 MG TABS tablet TAKE 1 TABLET(15 MG) BY MOUTH DAILY WITH BREAKFAST. DISCONTINUE ELIQUIS 90 tablet 2  ? sertraline (ZOLOFT) 50 MG tablet TAKE 1 TABLET(50 MG) BY MOUTH DAILY 90 tablet 3  ?  spironolactone (ALDACTONE) 25 MG tablet Take 1 tablet (25 mg total) by mouth every other day. 45 tablet 3  ? ?No current facility-administered medications on file prior to visit.  ? ? ?No Known Allergies ? ?Review of Systems ?No fevers, chills, nausea, muscle aches, no difficulty breathing, no calf pain, no chest pain or shortness of breath. ? ? ?Physical Exam ? ?GENERAL APPEARANCE: Alert, conversant. Appropriately groomed. No acute distress.  ? ?VASCULAR: Pedal pulses palpable DP and PT bilateral.  Capillary refill time is immediate to all digits,  Proximal to distal cooling is cool to cool purplish discoloration around the base of the hallux is seen consistent with what appears to be Raynaud's phenomenon. ? ?NEUROLOGIC: sensation is intact to 5.07 monofilament at 5/5 sites bilateral.  Light touch is intact bilateral, vibratory sensation intact bilateral ? ?MUSCULOSKELETAL: acceptable muscle strength, tone and stability bilateral.  No gross boney pedal deformities noted.  No pain, crepitus or limitation noted with foot and ankle range of motion bilateral.  ? ?DERMATOLOGIC: skin is warm, supple, and dry.  No open lesions noted.  No rash, no pre ulcerative lesions.  Digital nails are elongated, thickened, dystrophic, brittle and clinically mycotic bilateral.  Some discomfort with dorsal plantar pressure is also elicited with exam. ? ? ? ?Assessment  ? ?1. Pain due to onychomycosis of nail   ?2. Coagulation defect ([place])   ? ? ? ? ? ?Plan ?-Discussed and educated patient onfoot care, especially with regards to the vascular, neurological and musculoskeletal systems.  ?-Discussed supportive shoes at all times and checking feet regularly.  ?-Mechanically debrided all nails 1-5 bilateral using sterile nail nipper and filed with dremel without incident  ?-Answered all patient questions ?-Patient to return  in 3 months for at risk foot care ?-Patient advised to call the office if any problems or questions arise in the  meantime. ? ? ?Lorenda Peck, DPM  ?

## 2022-01-17 ENCOUNTER — Telehealth: Payer: Self-pay | Admitting: Family Medicine

## 2022-01-17 ENCOUNTER — Ambulatory Visit: Payer: Medicare Other | Admitting: Family Medicine

## 2022-01-17 ENCOUNTER — Encounter: Payer: Self-pay | Admitting: Family Medicine

## 2022-01-17 VITALS — BP 120/70 | HR 67 | Temp 97.8°F | Resp 16 | Ht 67.0 in

## 2022-01-17 DIAGNOSIS — F419 Anxiety disorder, unspecified: Secondary | ICD-10-CM

## 2022-01-17 DIAGNOSIS — Z87440 Personal history of urinary (tract) infections: Secondary | ICD-10-CM

## 2022-01-17 DIAGNOSIS — I10 Essential (primary) hypertension: Secondary | ICD-10-CM

## 2022-01-17 DIAGNOSIS — F02818 Dementia in other diseases classified elsewhere, unspecified severity, with other behavioral disturbance: Secondary | ICD-10-CM

## 2022-01-17 DIAGNOSIS — E538 Deficiency of other specified B group vitamins: Secondary | ICD-10-CM

## 2022-01-17 DIAGNOSIS — L89301 Pressure ulcer of unspecified buttock, stage 1: Secondary | ICD-10-CM

## 2022-01-17 DIAGNOSIS — G309 Alzheimer's disease, unspecified: Secondary | ICD-10-CM

## 2022-01-17 DIAGNOSIS — I4891 Unspecified atrial fibrillation: Secondary | ICD-10-CM

## 2022-01-17 DIAGNOSIS — E039 Hypothyroidism, unspecified: Secondary | ICD-10-CM | POA: Diagnosis not present

## 2022-01-17 DIAGNOSIS — M1990 Unspecified osteoarthritis, unspecified site: Secondary | ICD-10-CM

## 2022-01-17 LAB — CBC WITH DIFFERENTIAL/PLATELET
Basophils Absolute: 0 10*3/uL (ref 0.0–0.1)
Basophils Relative: 0.6 % (ref 0.0–3.0)
Eosinophils Absolute: 0.1 10*3/uL (ref 0.0–0.7)
Eosinophils Relative: 0.9 % (ref 0.0–5.0)
HCT: 45.8 % (ref 36.0–46.0)
Hemoglobin: 15.2 g/dL — ABNORMAL HIGH (ref 12.0–15.0)
Lymphocytes Relative: 16.1 % (ref 12.0–46.0)
Lymphs Abs: 1.1 10*3/uL (ref 0.7–4.0)
MCHC: 33.1 g/dL (ref 30.0–36.0)
MCV: 94.9 fl (ref 78.0–100.0)
Monocytes Absolute: 0.5 10*3/uL (ref 0.1–1.0)
Monocytes Relative: 8.2 % (ref 3.0–12.0)
Neutro Abs: 4.9 10*3/uL (ref 1.4–7.7)
Neutrophils Relative %: 74.2 % (ref 43.0–77.0)
Platelets: 233 10*3/uL (ref 150.0–400.0)
RBC: 4.83 Mil/uL (ref 3.87–5.11)
RDW: 15.6 % — ABNORMAL HIGH (ref 11.5–15.5)
WBC: 6.7 10*3/uL (ref 4.0–10.5)

## 2022-01-17 LAB — TSH: TSH: 1.38 u[IU]/mL (ref 0.35–5.50)

## 2022-01-17 LAB — COMPREHENSIVE METABOLIC PANEL
ALT: 10 U/L (ref 0–35)
AST: 19 U/L (ref 0–37)
Albumin: 4.2 g/dL (ref 3.5–5.2)
Alkaline Phosphatase: 113 U/L (ref 39–117)
BUN: 19 mg/dL (ref 6–23)
CO2: 28 mEq/L (ref 19–32)
Calcium: 9.5 mg/dL (ref 8.4–10.5)
Chloride: 104 mEq/L (ref 96–112)
Creatinine, Ser: 1.53 mg/dL — ABNORMAL HIGH (ref 0.40–1.20)
GFR: 30.37 mL/min — ABNORMAL LOW (ref >60.00)
Glucose, Bld: 96 mg/dL (ref 70–99)
Potassium: 3.7 mEq/L (ref 3.5–5.1)
Sodium: 143 mEq/L (ref 135–145)
Total Bilirubin: 0.8 mg/dL (ref 0.2–1.2)
Total Protein: 6.3 g/dL (ref 6.0–8.3)

## 2022-01-17 LAB — VITAMIN B12: Vitamin B-12: 210 pg/mL — ABNORMAL LOW (ref 211–911)

## 2022-01-17 MED ORDER — LEVOTHYROXINE SODIUM 75 MCG PO TABS: ORAL_TABLET | ORAL | 0 refills | Status: DC

## 2022-01-17 MED ORDER — HALOPERIDOL 1 MG PO TABS: 1.0000 mg | ORAL_TABLET | Freq: Two times a day (BID) | ORAL | 1 refills | Status: DC

## 2022-01-17 MED ORDER — SERTRALINE HCL 50 MG PO TABS: ORAL_TABLET | ORAL | 3 refills | Status: DC

## 2022-01-17 MED ORDER — NYSTATIN 100000 UNIT/GM EX POWD: 1.0000 "application " | Freq: Three times a day (TID) | CUTANEOUS | 3 refills | Status: AC

## 2022-01-17 MED ORDER — DICLOFENAC SODIUM 1 % EX GEL: 2.0000 g | Freq: Every day | CUTANEOUS | 3 refills | Status: AC | PRN

## 2022-01-17 MED ORDER — NYSTATIN 100000 UNIT/GM EX POWD: 1.0000 "application " | Freq: Three times a day (TID) | CUTANEOUS | 3 refills | Status: DC

## 2022-01-17 NOTE — Telephone Encounter (Signed)
Belinda Anderson is needing ordewrs for urine sample. ?

## 2022-01-17 NOTE — Patient Instructions (Signed)
Urinary Tract Infection, Adult A urinary tract infection (UTI) is an infection of any part of the urinary tract. The urinary tract includes the kidneys, ureters, bladder, and urethra. These organs make, store, and get rid of urine in the body. An upper UTI affects the ureters and kidneys. A lower UTI affects the bladder and urethra. What are the causes? Most urinary tract infections are caused by bacteria in your genital area around your urethra, where urine leaves your body. These bacteria grow and cause inflammation of your urinary tract. What increases the risk? You are more likely to develop this condition if: You have a urinary catheter that stays in place. You are not able to control when you urinate or have a bowel movement (incontinence). You are female and you: Use a spermicide or diaphragm for birth control. Have low estrogen levels. Are pregnant. You have certain genes that increase your risk. You are sexually active. You take antibiotic medicines. You have a condition that causes your flow of urine to slow down, such as: An enlarged prostate, if you are female. Blockage in your urethra. A kidney stone. A nerve condition that affects your bladder control (neurogenic bladder). Not getting enough to drink, or not urinating often. You have certain medical conditions, such as: Diabetes. A weak disease-fighting system (immunesystem). Sickle cell disease. Gout. Spinal cord injury. What are the signs or symptoms? Symptoms of this condition include: Needing to urinate right away (urgency). Frequent urination. This may include small amounts of urine each time you urinate. Pain or burning with urination. Blood in the urine. Urine that smells bad or unusual. Trouble urinating. Cloudy urine. Vaginal discharge, if you are female. Pain in the abdomen or the lower back. You may also have: Vomiting or a decreased appetite. Confusion. Irritability or tiredness. A fever or  chills. Diarrhea. The first symptom in older adults may be confusion. In some cases, they may not have any symptoms until the infection has worsened. How is this diagnosed? This condition is diagnosed based on your medical history and a physical exam. You may also have other tests, including: Urine tests. Blood tests. Tests for STIs (sexually transmitted infections). If you have had more than one UTI, a cystoscopy or imaging studies may be done to determine the cause of the infections. How is this treated? Treatment for this condition includes: Antibiotic medicine. Over-the-counter medicines to treat discomfort. Drinking enough water to stay hydrated. If you have frequent infections or have other conditions such as a kidney stone, you may need to see a health care provider who specializes in the urinary tract (urologist). In rare cases, urinary tract infections can cause sepsis. Sepsis is a life-threatening condition that occurs when the body responds to an infection. Sepsis is treated in the hospital with IV antibiotics, fluids, and other medicines. Follow these instructions at home: Medicines Take over-the-counter and prescription medicines only as told by your health care provider. If you were prescribed an antibiotic medicine, take it as told by your health care provider. Do not stop using the antibiotic even if you start to feel better. General instructions Make sure you: Empty your bladder often and completely. Do not hold urine for long periods of time. Empty your bladder after sex. Wipe from front to back after urinating or having a bowel movement if you are female. Use each tissue only one time when you wipe. Drink enough fluid to keep your urine pale yellow. Keep all follow-up visits. This is important. Contact a health care provider   if: Your symptoms do not get better after 1-2 days. Your symptoms go away and then return. Get help right away if: You have severe pain in your  back or your lower abdomen. You have a fever or chills. You have nausea or vomiting. Summary A urinary tract infection (UTI) is an infection of any part of the urinary tract, which includes the kidneys, ureters, bladder, and urethra. Most urinary tract infections are caused by bacteria in your genital area. Treatment for this condition often includes antibiotic medicines. If you were prescribed an antibiotic medicine, take it as told by your health care provider. Do not stop using the antibiotic even if you start to feel better. Keep all follow-up visits. This is important. This information is not intended to replace advice given to you by your health care provider. Make sure you discuss any questions you have with your health care provider. Document Revised: 05/15/2020 Document Reviewed: 05/15/2020 Elsevier Patient Education  2022 Elsevier Inc.  

## 2022-01-17 NOTE — Telephone Encounter (Signed)
They can be faxed to [phone].  ?

## 2022-01-17 NOTE — Progress Notes (Signed)
? ?Subjective:  ? ?By signing my name below, I, [name], attest that this documentation has been prepared under the direction and in the presence of Roma Schanz DO, 01/17/2022   ? ? Patient ID: Belinda Anderson, female    DOB: [birth year], 88 y.o.   MRN: [id number] ? ?No chief complaint on file. ? ? ?HPI ?Patient is in today for an office visit. She is with her daughter.  ? ?She is requesting a refill of 50 Mg of Zoloft, 75 MCG of Synthroid, and 1 MG of Haldol, and Voltaren.  ? ?Patient complains of persistent odor in urine. She has been wearing diapers. ? ?She continues to have a rash on her lower back. She is continuing to take Nysatin 3 times daily.  ? ?She is currently taking 12.5 MG of Carvedilol.  ? ?The swelling in her legs has been decreasing. She is currently taking 12.5 MG of Lasix. ? ?As of today's visit, her blood pressure is well. ?BP Readings from Last 3 Encounters:  ?01/17/22 120/70  ?11/22/21 (!) 118/52  ?10/26/21 100/80  ? ? ?Past Medical History:  ?Diagnosis Date  ? Arthritis   ? Atrial fibrillation ([place])   ? Hypertension   ? Thyroid disease   ? TIA (transient ischemic attack)   ? ? ?Past Surgical History:  ?Procedure Laterality Date  ? CARDIOVERSION N/A 04/15/2020  ? Procedure: CARDIOVERSION;  Surgeon: Elouise Munroe, MD;  Location: [facility];  Service: Cardiovascular;  Laterality: N/A;  ? EYE SURGERY    ? lens implant  ? IR ANGIO VERTEBRAL SEL SUBCLAVIAN INNOMINATE UNI R MOD SED  09/30/2019  ? IR CT HEAD LTD  09/30/2019  ? IR PERCUTANEOUS ART THROMBECTOMY/INFUSION INTRACRANIAL INC DIAG ANGIO  09/30/2019  ? RADIOLOGY WITH ANESTHESIA N/A 09/30/2019  ? Procedure: IR WITH ANESTHESIA;  Surgeon: Luanne Bras, MD;  Location: [place];  Service: Radiology;  Laterality: N/A;  ? TEE WITHOUT CARDIOVERSION N/A 04/15/2020  ? Procedure: TRANSESOPHAGEAL ECHOCARDIOGRAM (TEE);  Surgeon: Elouise Munroe, MD;  Location: [place];  Service: Cardiovascular;  Laterality: N/A;  ? ? ?Family  History  ?Problem Relation Age of Onset  ? Arthritis Mother   ? Heart disease Mother   ? Heart disease Father 76  ?     MI  ? Coronary artery disease Other   ? Diabetes Brother   ?     DI  ? ? ?Social History  ? ?Socioeconomic History  ? Marital status: Widowed  ?  Spouse name: Not on file  ? Number of children: 3  ? Years of education: 5  ? Highest education level: 12th grade  ?Occupational History  ? Not on file  ?Tobacco Use  ? Smoking status: Never  ? Smokeless tobacco: Never  ?Vaping Use  ? Vaping Use: Never used  ?Substance and Sexual Activity  ? Alcohol use: No  ? Drug use: No  ? Sexual activity: Not Currently  ?  Partners: Male  ?Other Topics Concern  ? Not on file  ?Social History Narrative  ? 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 ?Social Determinants of Health  ? ?Financial Resource Strain: Low Risk   ? Difficulty of Paying Living Expenses: Not hard at all  ?Food Insecurity: No Food Insecurity  ? Worried About Charity fundraiser in the Last Year: Never true  ? Ran Out of Food in the Last Year: Never true  ?Transportation Needs: No Transportation Needs  ? Lack of Transportation (  Medical): No  ? Lack of Transportation (Non-Medical): No  ?Physical Activity: Insufficiently Active  ? Days of Exercise per Week: 3 days  ? Minutes of Exercise per Session: 20 min  ?Stress: No Stress Concern Present  ? Feeling of Stress : Only a little  ?Social Connections: Moderately Isolated  ? Frequency of Communication with Friends and Family: More than three times a week  ? Frequency of Social Gatherings with Friends and Family: More than three times a week  ? Attends Religious Services: More than 4 times per year  ? Active Member of Clubs or Organizations: No  ? Attends Archivist Meetings: Never  ? Marital Status: Widowed  ?Intimate Partner Violence: Not At Risk  ? Fear of Current or Ex-Partner: No  ? Emotionally Abused: No  ? Physically Abused: No  ? Sexually Abused: No   ? ? ?Outpatient Medications Prior to Visit  ?Medication Sig Dispense Refill  ? acetaminophen (TYLENOL) 650 MG CR tablet Take 1,300 mg by mouth daily as needed for pain (arthritis).     ? cephALEXin (KEFLEX) 500 MG capsule Take 1 capsule (500 mg total) by mouth 2 (two) times daily. 20 capsule 0  ? diclofenac Sodium (VOLTAREN) 1 % GEL Apply 2 g topically daily as needed (pain). 150 g 3  ? diltiazem (CARDIZEM) 60 MG tablet Take 1 tablet (60 mg total) by mouth daily. 30 tablet 3  ? furosemide (LASIX) 20 MG tablet Take 3 tablets (60 mg total) by mouth daily. MAY TAKE AN ADDITIONAL TABLET AS NEEDED FOR SWELLING. 270 tablet 2  ? Glycerin-Hypromellose-PEG 400 ([facility] DRY EYE RELIEF) 0.2-0.2-1 % SOLN Place 1 drop into both eyes daily as needed (Dry eye).    ? haloperidol (HALDOL) 1 MG tablet Take 1 tablet (1 mg total) by mouth 2 (two) times daily. 60 tablet 1  ? levothyroxine (SYNTHROID) 75 MCG tablet TAKE 1 TABLET(75 MCG) BY MOUTH DAILY 90 tablet 0  ? NONFORMULARY OR COMPOUNDED ITEM rollator walker  #1  As directed -------  Dx  CHF,  DOE,  OA 1 each 0  ? nystatin (MYCOSTATIN/NYSTOP) powder Apply 1 application topically 3 (three) times daily. 60 g 3  ? potassium chloride SA (KLOR-CON) 20 MEQ tablet Take 1 tablet (20 mEq total) by mouth daily. 90 tablet 3  ? Rivaroxaban (XARELTO) 15 MG TABS tablet TAKE 1 TABLET(15 MG) BY MOUTH DAILY WITH BREAKFAST. DISCONTINUE ELIQUIS 90 tablet 2  ? sertraline (ZOLOFT) 50 MG tablet TAKE 1 TABLET(50 MG) BY MOUTH DAILY 90 tablet 3  ? spironolactone (ALDACTONE) 25 MG tablet Take 1 tablet (25 mg total) by mouth every other day. 45 tablet 3  ? ?No facility-administered medications prior to visit.  ? ? ?No Known Allergies ? ?Review of Systems  ?Constitutional:  Negative for fever and malaise/fatigue.  ?HENT:  Negative for congestion.   ?Eyes:  Negative for blurred vision.  ?Respiratory:  Negative for shortness of breath.   ?Cardiovascular:  Negative for chest pain, palpitations and leg swelling.   ?Gastrointestinal:  Negative for abdominal pain, blood in stool and nausea.  ?Genitourinary:  Negative for dysuria and frequency.  ?     (+) Odor in Urine  ?Musculoskeletal:  Negative for falls.  ?Skin:  Positive for rash (Lower Back).  ?Neurological:  Negative for dizziness, loss of consciousness and headaches.  ?Endo/Heme/Allergies:  Negative for environmental allergies.  ?Psychiatric/Behavioral:  Negative for depression. The patient is not nervous/anxious.   ? ?   ?Objective:  ?  ?Physical  Exam ?Vitals and nursing note reviewed.  ?Constitutional:   ?   General: She is not in acute distress. ?   Appearance: Normal appearance. She is well-developed. She is not ill-appearing.  ?HENT:  ?   Head: Normocephalic and atraumatic.  ?   Right Ear: External ear normal.  ?   Left Ear: External ear normal.  ?Eyes:  ?   Extraocular Movements: Extraocular movements intact.  ?   Conjunctiva/sclera: Conjunctivae normal.  ?   Pupils: Pupils are equal, round, and reactive to light.  ?Neck:  ?   Thyroid: No thyromegaly.  ?   Vascular: No carotid bruit or JVD.  ?Cardiovascular:  ?   Rate and Rhythm: Normal rate and regular rhythm.  ?   Heart sounds: Normal heart sounds. No murmur heard. ?  No gallop.  ?Pulmonary:  ?   Effort: Pulmonary effort is normal. No respiratory distress.  ?   Breath sounds: Normal breath sounds. No wheezing or rales.  ?Chest:  ?   Chest wall: No tenderness.  ?Musculoskeletal:  ?   Cervical back: Normal range of motion and neck supple.  ?Skin: ?   General: Skin is warm and dry.  ?Neurological:  ?   Mental Status: She is alert and oriented to person, place, and time.  ?Psychiatric:     ?   Judgment: Judgment normal.  ? ? ?There were no vitals taken for this visit. ?Wt Readings from Last 3 Encounters:  ?11/22/21 147 lb (66.7 kg)  ?08/21/21 165 lb 2 oz (74.9 kg)  ?07/22/21 147 lb 6.4 oz (66.9 kg)  ? ? ?Diabetic Foot Exam - Simple   ?No data filed ?  ? ?Lab Results  ?Component Value Date  ? WBC 8.0 09/13/2021  ?  HGB 14.8 09/13/2021  ? HCT 44.9 09/13/2021  ? PLT 236.0 09/13/2021  ? GLUCOSE 82 09/13/2021  ? CHOL 177 09/13/2021  ? TRIG 128.0 09/13/2021  ? HDL 40.30 09/13/2021  ? LDLDIRECT 97.0 04/06/2018  ? LDLCALC 112 (H) 11/28/202

## 2022-01-18 MED ORDER — NONFORMULARY OR COMPOUNDED ITEM: 0 refills | Status: AC

## 2022-01-18 NOTE — Telephone Encounter (Signed)
Pt's daughter states that [facility] is telling her they never received the urine lab orders. She would like to be faxed to [phone] , attn Leeann Must med room. Please advice. ?

## 2022-01-18 NOTE — Telephone Encounter (Signed)
Orders sent

## 2022-01-19 ENCOUNTER — Other Ambulatory Visit (INDEPENDENT_AMBULATORY_CARE_PROVIDER_SITE_OTHER): Payer: Medicare Other

## 2022-01-19 DIAGNOSIS — Z87440 Personal history of urinary (tract) infections: Secondary | ICD-10-CM | POA: Diagnosis not present

## 2022-01-19 DIAGNOSIS — R8290 Unspecified abnormal findings in urine: Secondary | ICD-10-CM

## 2022-01-19 LAB — POC URINALSYSI DIPSTICK (AUTOMATED)
Bilirubin, UA: NEGATIVE
Blood, UA: NEGATIVE
Glucose, UA: NEGATIVE
Ketones, UA: NEGATIVE
Leukocytes, UA: NEGATIVE
Nitrite, UA: POSITIVE
Protein, UA: NEGATIVE
Spec Grav, UA: 1.015 (ref 1.010–1.025)
Urobilinogen, UA: 0.2 E.U./dL
pH, UA: 6 (ref 5.0–8.0)

## 2022-01-20 LAB — URINE CULTURE
MICRO NUMBER:: 13225772
SPECIMEN QUALITY:: ADEQUATE

## 2022-01-26 ENCOUNTER — Non-Acute Institutional Stay: Payer: Medicare Other | Admitting: Internal Medicine

## 2022-01-26 VITALS — BP 115/79 | HR 77 | Temp 97.2°F | Resp 19 | Wt 144.0 lb

## 2022-01-26 DIAGNOSIS — I5042 Chronic combined systolic (congestive) and diastolic (congestive) heart failure: Secondary | ICD-10-CM

## 2022-01-26 DIAGNOSIS — N1832 Chronic kidney disease, stage 3b: Secondary | ICD-10-CM

## 2022-01-26 DIAGNOSIS — Z515 Encounter for palliative care: Secondary | ICD-10-CM

## 2022-01-26 DIAGNOSIS — F01C3 Vascular dementia, severe, with mood disturbance: Secondary | ICD-10-CM

## 2022-01-26 NOTE — Progress Notes (Signed)
[facility] Pall[facility] Care Follow-Up Visit Telephone: [phone]  Fax: [phone]   Date of encounter: 01/26/22 10:58 AM PATIENT NAME: Belinda Anderson [address] [name]   [phone] (home)  DOB: Jun 15, 1932 MRN: [id number] PRIMARY CARE PROVIDER:    [name], DO,  [address] [place] [address] [phone]  REFERRING PROVIDER:   [name], DO [address] [place],  [place] [address] [phone]  RESPONSIBLE PARTY:    Contact Information     Name Relation Home Work [name] [phone]  [phone]   Belinda Anderson, Belinda Anderson   [phone]        I met face to face with patient and family in [facility]. Palliative Care was asked to follow this patient by consultation request of  Belinda Anderson, * to address advance care planning and complex medical decision making. This is follow-up visit.                                     ASSESSMENT AND PLAN / RECOMMENDATIONS:   Advance Care Planning/Goals of Care: Goals include to maximize quality of life and symptom management. Patient/health care surrogate gave his/her permission to discuss.Our advance care planning conversation included a discussion about:    The value and importance of advance care planning  Experiences with loved ones who have been seriously ill or have died  Exploration of personal, cultural or spiritual beliefs that might influence medical decisions  Exploration of goals of care in the event of a sudden injury or illness  Identification  of a healthcare agent--daughter Review and updating or creation of an  advance directive document . Decision not to resuscitate or to de-escalate disease focused treatments due to poor prognosis. CODE STATUS:  DNR  Symptom Management/Plan: 1. Severe vascular dementia with mood disturbance  (HCC) -continues on haldol 70m po bid for agitation and sertraline for depression--seen lying in early am, seemed to prefer to be left alone -staff reported no concerns   2. Chronic combined systolic and diastolic CHF (congestive heart failure) (HCC) -continues on lasix 443mdaily plus 2029mdditional for weight gain, edema, sob but appears euvolemic to hypovolemic this am  3. Stage 3b chronic kidney disease (HCC) -GFR stable at 30 on labs with PCP earlier this month, cont to monitor and avoid nephrotoxic meds, encourage water as form of hydration  4. Palliative care encounter -need to speak with her daughter to clarify that there are not any needs as far as completing MOST form or other concerns, but staff did not have concerns  Follow up Palliative Care Visit: Palliative care will continue to follow for complex medical decision making, advance care planning, and clarification of goals. Return 12 weeks or prn.    This visit was coded based on medical decision making (MDM).  PPS: 40%  HOSPICE ELIGIBILITY/DIAGNOSIS: TBD  Chief Complaint: Follow-up palliative visit  HISTORY OF PRESENT ILLNESS:  Belinda Anderson a [age]o. year old female  with vascular dementia with behaviors and prior strokes, dry eyes, depression, anxiety, and afib seen for palliative care follow-up.  The resident care director had no concerns about her.    History obtained from review of EMR, discussion with primary team, and interview with family, facility staff/caregiver and/or Belinda Anderson.  I  reviewed available labs, medications, imaging, studies and related documents from the EMR.  Records reviewed and summarized above.   ROS  General: NAD EYES: denies vision changes ENMT: denies dysphagia--on regular diet with thin liquids Cardiovascular: denies chest pain, denies DOE Pulmonary: denies cough, denies increased SOB Abdomen: endorses good appetite, denies constipation, endorses incontinence of bowel GU: denies  dysuria, endorses incontinence of urine MSK:  has increased weakness,  no falls reported Skin: denies rashes or wounds Neurological: denies pain, denies insomnia Psych: Endorses positive mood Heme/lymph/immuno: denies bruises, abnormal bleeding  Physical Exam: Today's Vitals   01/26/22 0811  BP: 115/79  Pulse: 77  Resp: 19  Temp: (!) 97.2 F (36.2 C)  SpO2: 95%  Weight: 144 lb (65.3 kg)   Body mass index is 22.55 kg/m.  Current and past weights: wt down 3 lbs from feb visit Constitutional: NAD General: frail appearing, thin EYES: anicteric sclera, lids intact, no discharge  ENMT: hard of hearing, oral mucous membranes moist, dentition intact CV: irreg irreg, no LE edema Pulmonary: LCTA, no increased work of breathing, no cough, room air Abdomen: intake fair, normo-active BS + 4 quadrants, soft and non tender, no ascites GU: deferred MSK: sarcopenia, resting in bed and not wanting to be bothered Skin: warm and dry, no rashes or wounds on visible skin Neuro:  generalized weakness,  advanced cognitive impairment Psych: non-anxious affect, did arouse when touched Hem/lymph/immuno: no widespread bruising  CURRENT PROBLEM LIST:  Patient Active Problem List   Diagnosis Date Noted   Frequent falls 10/28/2021   Joint swelling 09/13/2021   Friction blisters of the skin 09/13/2021   Pressure ulcer of sacral region, stage 1 04/23/2021   Intertrigo 04/23/2021   Abnormal urine odor 04/23/2021   Left foot pain 02/10/2021   Weakness 02/10/2021   Urinary tract infection without hematuria 02/10/2021   Cellulitis 02/10/2021   Dementia ([place]) 02/10/2021   Gout 02/10/2021   SOB (shortness of breath) 02/05/2021   CKD (chronic kidney disease), stage III ([place]) 02/05/2021   Anxiety 12/24/2020   Impacted cerumen of right ear 12/24/2020   Acute on chronic diastolic CHF (congestive heart failure) ([place]) 11/26/2020   Bilateral leg edema 01/30/2020   COVID-19 virus infection 11/21/2019    Cough 11/21/2019   External hemorrhoid, bleeding 10/12/2019   AF (paroxysmal atrial fibrillation) ([place]) 10/02/2019   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 ([place]) s/p IR R MCA M2 09/30/2019   Middle cerebral artery embolism, right 09/30/2019   Contusion of foot including toes, right, initial encounter 08/10/2018   Primary osteoarthritis of right knee 07/31/2018   Right leg swelling 07/17/2018   Pain of right calf 07/17/2018   Preventative health care 09/18/2017   Low back pain radiating to left leg 11/28/2016   Hyperlipidemia 02/02/2015   Sciatica 07/16/2014   Knee pain, acute 07/16/2014   Lower extremity pain, right 07/16/2014   Skin infection 07/16/2014   Incontinence of urine in female 06/19/2013   Cerebral aneurysm rupture ([place]) 04/08/2013   LEG EDEMA, RIGHT 07/06/2010   OTHER DRUG ALLERGY 07/06/2010   DIARRHEA 02/12/2010   HEMATURIA, HX OF 11/24/2009   ANEURYSM, HX OF 08/03/2009   B12 deficiency 07/24/2009   ACQUIRED HEMOLYTIC ANEMIA UNSPECIFIED 07/24/2009   SYNCOPE 07/20/2009   DIZZINESS 07/20/2009   Memory loss 07/20/2009   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   Hypothyroidism 12/14/2007   Essential hypertension 12/14/2007   DIVERTICULITIS, HX OF 12/14/2007   GOITER 01/30/2007   HYPERTENSION, BENIGN 01/30/2007  UTERINE POLYP 01/30/2007   THYROIDECTOMY, HX OF 01/30/2007   PAST MEDICAL HISTORY:  Active Ambulatory Problems    Diagnosis Date Noted   GOITER 01/30/2007   Hypothyroidism 12/14/2007   B12 deficiency 07/24/2009   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   ACQUIRED HEMOLYTIC ANEMIA UNSPECIFIED 07/24/2009   HYPERTENSION, BENIGN 01/30/2007   Essential hypertension 12/14/2007   UTERINE POLYP 01/30/2007   SYNCOPE 07/20/2009   DIZZINESS 07/20/2009   Memory loss 07/20/2009   LEG EDEMA, RIGHT 07/06/2010   DIARRHEA 02/12/2010   OTHER DRUG ALLERGY 07/06/2010   ANEURYSM, HX OF 08/03/2009   DIVERTICULITIS, HX OF 12/14/2007    HEMATURIA, HX OF 11/24/2009   THYROIDECTOMY, HX OF 01/30/2007   Cerebral aneurysm rupture (HCC) 04/08/2013   Incontinence of urine in female 06/19/2013   Sciatica 07/16/2014   Knee pain, acute 07/16/2014   Lower extremity pain, right 07/16/2014   Skin infection 07/16/2014   Hyperlipidemia 02/02/2015   Low back pain radiating to left leg 11/28/2016   Preventative health care 09/18/2017   Right leg swelling 07/17/2018   Pain of right calf 07/17/2018   Primary osteoarthritis of right knee 07/31/2018   Contusion of foot including toes, right, initial encounter 08/10/2018   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 ([place]) s/p IR R MCA M2 09/30/2019   Middle cerebral artery embolism, right 09/30/2019   AF (paroxysmal atrial fibrillation) ([place]) 10/02/2019   External hemorrhoid, bleeding 10/12/2019   COVID-19 virus infection 11/21/2019   Cough 11/21/2019   Bilateral leg edema 01/30/2020   Acute on chronic diastolic CHF (congestive heart failure) ([place]) 11/26/2020   Anxiety 12/24/2020   Impacted cerumen of right ear 12/24/2020   SOB (shortness of breath) 02/05/2021   CKD (chronic kidney disease), stage III ([place]) 02/05/2021   Left foot pain 02/10/2021   Weakness 02/10/2021   Urinary tract infection without hematuria 02/10/2021   Cellulitis 02/10/2021   Dementia ([place]) 02/10/2021   Gout 02/10/2021   Pressure ulcer of sacral region, stage 1 04/23/2021   Intertrigo 04/23/2021   Abnormal urine odor 04/23/2021   Joint swelling 09/13/2021   Friction blisters of the skin 09/13/2021   Frequent falls 10/28/2021   Resolved Ambulatory Problems    Diagnosis Date Noted   Hyperlipidemia associated with type 2 diabetes mellitus ([place]) 10/12/2019   Acute on chronic diastolic (congestive) heart failure ([place]) 02/04/2021   Past Medical History:  Diagnosis Date   Arthritis    Atrial fibrillation ([place])    Hypertension    Thyroid disease    TIA (transient ischemic attack)    SOCIAL HX:   Social History   Tobacco Use   Smoking status: Never   Smokeless tobacco: Never  Substance Use Topics   Alcohol use: No     ALLERGIES: No Known Allergies   PERTINENT MEDICATIONS:  Outpatient Encounter Medications as of 01/26/2022  Medication Sig   acetaminophen (TYLENOL) 650 MG CR tablet Take 1,300 mg by mouth daily as needed for pain (arthritis).    cephALEXin (KEFLEX) 500 MG capsule Take 1 capsule (500 mg total) by mouth 2 (two) times daily.   diclofenac Sodium (VOLTAREN) 1 % GEL Apply 2 g topically daily as needed (pain).   diltiazem (CARDIZEM) 60 MG tablet Take 1 tablet (60 mg total) by mouth daily.   furosemide (LASIX) 20 MG tablet Take 3 tablets (60 mg total) by mouth daily. MAY TAKE AN ADDITIONAL TABLET AS NEEDED FOR SWELLING.   Glycerin-Hypromellose-PEG 400 ([employer] DRY EYE RELIEF) 0.2-0.2-1 %  SOLN Place 1 drop into both eyes daily as needed (Dry eye).   haloperidol (HALDOL) 1 MG tablet Take 1 tablet (1 mg total) by mouth 2 (two) times daily.   levothyroxine (SYNTHROID) 75 MCG tablet TAKE 1 TABLET(75 MCG) BY MOUTH DAILY   NONFORMULARY OR COMPOUNDED ITEM rollator walker  #1  As directed -------  Dx  CHF,  DOE,  OA   NONFORMULARY OR COMPOUNDED ITEM Standing orders for Urinalysis and urine culture   nystatin (MYCOSTATIN/NYSTOP) powder Apply 1 application. topically 3 (three) times daily.   potassium chloride SA (KLOR-CON) 20 MEQ tablet Take 1 tablet (20 mEq total) by mouth daily.   Rivaroxaban (XARELTO) 15 MG TABS tablet TAKE 1 TABLET(15 MG) BY MOUTH DAILY WITH BREAKFAST. DISCONTINUE ELIQUIS   sertraline (ZOLOFT) 50 MG tablet TAKE 1 TABLET(50 MG) BY MOUTH DAILY   spironolactone (ALDACTONE) 25 MG tablet Take 1 tablet (25 mg total) by mouth every other day. (Patient taking differently: Take 12.5 mg by mouth every other day.)   No facility-administered encounter medications on file as of 01/26/2022.   Thank you for the opportunity to participate in the care of Ms. Koepke.  The palliative  care team will continue to follow. Please call our office at [phone] if we can be of additional assistance.   Hollace Kinnier, DO  COVID-19 PATIENT SCREENING TOOL Asked and negative response unless otherwise noted:  Have you had symptoms of covid, tested positive or been in contact with someone with symptoms/positive test in the past 5-10 days? no

## 2022-01-28 ENCOUNTER — Encounter: Payer: Self-pay | Admitting: Internal Medicine

## 2022-02-02 MED ORDER — CARBOXYMETHYLCELLULOSE SODIUM 1 % OP GEL: 1.0000 [drp] | Freq: Every day | OPHTHALMIC | 11 refills | Status: AC

## 2022-02-03 ENCOUNTER — Ambulatory Visit (INDEPENDENT_AMBULATORY_CARE_PROVIDER_SITE_OTHER): Payer: Medicare Other

## 2022-02-03 DIAGNOSIS — E538 Deficiency of other specified B group vitamins: Secondary | ICD-10-CM

## 2022-02-03 MED ORDER — CYANOCOBALAMIN 1000 MCG/ML IJ SOLN
1000.0000 ug | Freq: Once | INTRAMUSCULAR | Status: AC
  Administered 2022-02-03: 1000 ug via INTRAMUSCULAR

## 2022-02-03 NOTE — Progress Notes (Signed)
Belinda Anderson is a 88 y.o. female presents to the office today for B12:  injections # 1 of 4 weely, per physician's orders. ?Original order: Per Dr. [name] on lab resutls from 01-23-22 to start B12 once a week for 4 weeks and monthly after that.  ?Cyanocobalamin (med), 1000 mg/ml (dose),  im (route) was administered left deltoid (location) today. Patient tolerated injection. ? ?Patient next injection due: in one week, appt made 02/10/22. ? ?[place] ? ?

## 2022-02-10 ENCOUNTER — Ambulatory Visit (INDEPENDENT_AMBULATORY_CARE_PROVIDER_SITE_OTHER): Payer: Medicare Other

## 2022-02-10 DIAGNOSIS — E538 Deficiency of other specified B group vitamins: Secondary | ICD-10-CM | POA: Diagnosis not present

## 2022-02-10 MED ORDER — CYANOCOBALAMIN 1000 MCG/ML IJ SOLN
1000.0000 ug | Freq: Once | INTRAMUSCULAR | Status: AC
  Administered 2022-02-10: 1000 ug via INTRAMUSCULAR

## 2022-02-10 NOTE — Progress Notes (Signed)
Belinda Anderson is a 88 y.o. female presents to the office today for 2/4 Weekly B12 injection, per physician's orders. ?Original order: Per Dr. [name] on lab resutls from 01-23-22 to start B12 once a week for 4 weeks and monthly after that.  ?Cyanocobalamin 1000 mg/ml, IM was administered R deltoid today. Patient tolerated injection. ?Patient next injection due: in one week, appt made NO- appt is already pending.  ? ?[place], Melton Alar L ? ?

## 2022-02-17 ENCOUNTER — Telehealth: Payer: Self-pay | Admitting: Family Medicine

## 2022-02-17 ENCOUNTER — Ambulatory Visit: Payer: Medicare Other

## 2022-02-17 NOTE — Telephone Encounter (Signed)
FYI

## 2022-02-17 NOTE — Telephone Encounter (Signed)
Patient's daughter called to cancel pts B12 appt today and make Dr. Laury Axon aware that her mom fell at [facility] living and she is getting a x-ray of leg. ?

## 2022-02-24 ENCOUNTER — Ambulatory Visit: Payer: Medicare Other

## 2022-03-07 ENCOUNTER — Non-Acute Institutional Stay: Payer: Medicare Other | Admitting: *Deleted

## 2022-03-07 DIAGNOSIS — Z515 Encounter for palliative care: Secondary | ICD-10-CM

## 2022-03-07 NOTE — Progress Notes (Signed)
[facility] RN NOTE  PATIENT NAME: Destani Schlack DOB: 1932-09-22 MRN: [id number]  PRIMARY CARE PROVIDER: [name], [name], DO  RESPONSIBLE PARTY: Madelyn Brunner (daughter) Acct ID - Guarantor Home Phone Work Phone Relationship Acct Type  [id number] RAAYA, [phone]  Self P/F     [address], [place], [place] [address]   Due to the COVID-19 crisis, this virtual check-in visit was done via telephone from my office and it was initiated and consent by this patient and or family.  RN telephone encounter completed with patient's daughter-Marie. Received a call from [place] at [facility] in [place] stating that patient has been experiencing cough and nasal congestion. Spoke with Lelan Pons who states she was visiting with patient yesterday and she was not feeling well. She has a lot of congestion that appears to be getting worse. Patient has a history of bronchitis. Marie's sister visited later in the day and told her patient was feeling worse during her visit. She would like for patient to be assessed to see if patient may need a chest x-ray. Advised I will visit with patient tomorrow at the facility and provide her with an update after visit. She is agreeable to this plan. Palliative care will continue to follow.   (Duration of visit and documentation 20 minutes)    Daryl Eastern, RN BSN

## 2022-03-09 ENCOUNTER — Other Ambulatory Visit: Payer: Medicare Other | Admitting: *Deleted

## 2022-03-09 VITALS — BP 122/64 | HR 61 | Temp 97.7°F | Resp 20

## 2022-03-09 DIAGNOSIS — Z515 Encounter for palliative care: Secondary | ICD-10-CM

## 2022-03-10 NOTE — Progress Notes (Signed)
[facility] PALLIATIVE CARE RN NOTE  PATIENT NAME: Belinda Anderson DOB: [birth year] MRN: [id number]  PRIMARY CARE PROVIDER: [name], Grayling Congress, DO  RESPONSIBLE PARTY: [name] (daughter) Acct ID - Guarantor Home Phone Work Phone Relationship Acct Type  [id number] RHIANON, ZABAWA* [phone]  Self P/F     [address], [place], [place] [address]   RN face to face visit completed with patient in her room at [facility]. Visit made as requested by her daughter-Marie. Patient has been experiencing increased cough and congestion per daughter. Patient sitting up in a chair in her room. Reports feeling tired and not wanting to do anything but sleep. She denies pain. She has a congested cough, but lungs sound clear upon auscultation. Sounds like congestion is mainly in throat region. Med aide reports that Kelli Hope NP visited with patient today and ordered a chest x-ray. Patient remains ambulatory using her walker. She has no other complaints other than coughing and increased fatigue. Palliative care will continue to follow.   PHYSICAL EXAM:   VITALS: Today's Vitals   03/09/22 1356  BP: 122/64  Pulse: 61  Resp: 20  Temp: 97.7 F (36.5 C)  TempSrc: Temporal  SpO2: 100%  PainSc: 0-No pain    LUNGS: clear to auscultation , congested nonproductive cough CARDIAC: Cor RRR EXTREMITIES: No edema SKIN:  Exposed skin is dry and intact   NEURO:  Alert and oriented to person/place, intermittent confusion, forgetful, generalized weakness, ambulatory w/rollator walker   (Duration of visit and documentation 30 minutes)    Candiss Norse, RN BSN

## 2022-04-13 ENCOUNTER — Ambulatory Visit: Payer: Medicare Other | Admitting: Podiatry

## 2022-04-13 ENCOUNTER — Encounter: Payer: Self-pay | Admitting: Podiatry

## 2022-04-13 DIAGNOSIS — D689 Coagulation defect, unspecified: Secondary | ICD-10-CM | POA: Diagnosis not present

## 2022-04-13 DIAGNOSIS — B351 Tinea unguium: Secondary | ICD-10-CM | POA: Diagnosis not present

## 2022-04-13 DIAGNOSIS — M79609 Pain in unspecified limb: Secondary | ICD-10-CM

## 2022-04-13 NOTE — Progress Notes (Signed)
HPI: Patient is 88 y.o. female who presents today for elongated and thickened toenails which are difficult to trim.  They are painful in shoes and ambulation when they become this long.  She is also on a blood thinner.  She presents today with her caregiver.  Patient Active Problem List   Diagnosis Date Noted   Frequent falls 10/28/2021   Joint swelling 09/13/2021   Friction blisters of the skin 09/13/2021   Pressure ulcer of sacral region, stage 1 04/23/2021   Intertrigo 04/23/2021   Abnormal urine odor 04/23/2021   Left foot pain 02/10/2021   Weakness 02/10/2021   Urinary tract infection without hematuria 02/10/2021   Cellulitis 02/10/2021   Dementia (HCC) 02/10/2021   Gout 02/10/2021   SOB (shortness of breath) 02/05/2021   CKD (chronic kidney disease), stage III (HCC) 02/05/2021   Anxiety 12/24/2020   Impacted cerumen of right ear 12/24/2020   Acute on chronic diastolic CHF (congestive heart failure) (HCC) 11/26/2020   Bilateral leg edema 01/30/2020   COVID-19 virus infection 11/21/2019   Cough 11/21/2019   External hemorrhoid, bleeding 10/12/2019   AF (paroxysmal atrial fibrillation) (HCC) 10/02/2019   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 09/30/2019   Middle cerebral artery embolism, right 09/30/2019   Contusion of foot including toes, right, initial encounter 08/10/2018   Primary osteoarthritis of right knee 07/31/2018   Right leg swelling 07/17/2018   Pain of right calf 07/17/2018   Preventative health care 09/18/2017   Low back pain radiating to left leg 11/28/2016   Hyperlipidemia 02/02/2015   Sciatica 07/16/2014   Knee pain, acute 07/16/2014   Lower extremity pain, right 07/16/2014   Skin infection 07/16/2014   Incontinence of urine in female 06/19/2013   Cerebral aneurysm rupture (HCC) 04/08/2013   LEG EDEMA, RIGHT 07/06/2010   OTHER DRUG ALLERGY 07/06/2010   DIARRHEA 02/12/2010   HEMATURIA, HX OF 11/24/2009    ANEURYSM, HX OF 08/03/2009   B12 deficiency 07/24/2009   ACQUIRED HEMOLYTIC ANEMIA UNSPECIFIED 07/24/2009   SYNCOPE 07/20/2009   DIZZINESS 07/20/2009   Memory loss 07/20/2009   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      Carboxymethylcellulose Sodium (REFRESH LIQUIGEL) 1 % GEL Apply 1 drop to eye at bedtime. 15 mL 11   cephALEXin (KEFLEX) 500 MG capsule Take 1 capsule (500 mg total) by mouth 2 (two) times daily. 20 capsule 0   diclofenac Sodium (VOLTAREN) 1 % GEL Apply 2 g topically daily as needed (pain). 150 g 3   diltiazem (CARDIZEM) 60 MG tablet Take 1 tablet (60 mg total) by mouth daily. 30 tablet 3   furosemide (LASIX) 20 MG tablet Take 2 tablets (40 mg total) by mouth daily. MAY TAKE AN ADDITIONAL TABLET AS NEEDED FOR SWELLING. 270 tablet 2   Glycerin-Hypromellose-PEG 400 ([employer] DRY EYE RELIEF) 0.2-0.2-1 % SOLN Place 1 drop into both eyes daily as needed (Dry eye).     haloperidol (HALDOL) 1 MG tablet Take 1 tablet (1 mg total) by mouth 2 (two) times daily.  60 tablet 1   levothyroxine (SYNTHROID) 75 MCG tablet TAKE 1 TABLET(75 MCG) BY MOUTH DAILY 90 tablet 0   NONFORMULARY OR COMPOUNDED ITEM rollator walker  #1  As directed -------  Dx  CHF,  DOE,  OA 1 each 0   NONFORMULARY OR COMPOUNDED ITEM Standing orders for Urinalysis and urine culture 1 each 0   nystatin (MYCOSTATIN/NYSTOP) powder Apply 1 application. topically 3 (three) times daily. 60 g 3   Polyethyl Glyc-Propyl Glyc PF (SYSTANE HYDRATION PF) 0.4-0.3 % SOLN Apply 1 drop to eye in the morning, at noon, in the evening,  and at bedtime. 30 each 11   potassium chloride SA (KLOR-CON) 20 MEQ tablet Take 1 tablet (20 mEq total) by mouth daily. 90 tablet 3   Rivaroxaban (XARELTO) 15 MG TABS tablet TAKE 1 TABLET(15 MG) BY MOUTH DAILY WITH BREAKFAST. DISCONTINUE ELIQUIS 90 tablet 2   sertraline (ZOLOFT) 50 MG tablet TAKE 1 TABLET(50 MG) BY MOUTH DAILY 90 tablet 3   spironolactone (ALDACTONE) 25 MG tablet Take 0.5 tablets (12.5 mg total) by mouth every other day. 15 tablet 3   No current facility-administered medications on file prior to visit.    No Known Allergies  Review of Systems No fevers, chills, nausea, muscle aches, no difficulty breathing, no calf pain, no chest pain or shortness of breath.   Physical Exam  GENERAL APPEARANCE: Alert, conversant. Appropriately groomed. No acute distress.   VASCULAR: Pedal pulses palpable DP and PT bilateral.  Capillary refill time is immediate to all digits,  Proximal to distal cooling is cool to cool purplish discoloration around the base of the hallux is seen consistent with what appears to be Raynaud's phenomenon.  NEUROLOGIC: sensation is intact to 5.07 monofilament at 5/5 sites bilateral.  Light touch is intact bilateral, vibratory sensation intact bilateral  MUSCULOSKELETAL: acceptable muscle strength, tone and stability bilateral.  No gross boney pedal deformities noted.  No pain, crepitus or limitation noted with foot and ankle range of motion bilateral.   DERMATOLOGIC: skin is warm, supple, and dry.  No open lesions noted.  No rash, no pre ulcerative lesions.  Digital nails are elongated, thickened, dystrophic, brittle and clinically mycotic bilateral.  Some discomfort with dorsal plantar pressure is also elicited with exam.    Assessment   1. Pain due to onychomycosis of nail   2. Coagulation defect (HCC)        Plan -Discussed and educated patient onfoot care, especially with regards to the vascular, neurological and musculoskeletal systems.   -Discussed supportive shoes at all times and checking feet regularly.  -Mechanically debrided all nails 1-5 bilateral using sterile nail nipper and filed with dremel without incident  -Answered all patient questions -Patient to return  in 3 months for at risk foot care -Patient advised to call the office if any problems or questions arise in the meantime.   Louann Sjogren, DPM

## 2022-04-16 ENCOUNTER — Encounter: Payer: Self-pay | Admitting: Family Medicine

## 2022-05-02 ENCOUNTER — Ambulatory Visit (INDEPENDENT_AMBULATORY_CARE_PROVIDER_SITE_OTHER): Payer: Medicare Other | Admitting: Family Medicine

## 2022-05-02 ENCOUNTER — Ambulatory Visit (HOSPITAL_BASED_OUTPATIENT_CLINIC_OR_DEPARTMENT_OTHER)
Admission: RE | Admit: 2022-05-02 | Discharge: 2022-05-02 | Disposition: A | Discharge status: Home / Self Care | Payer: Medicare Other | Source: Ambulatory Visit | Attending: Family Medicine | Admitting: Family Medicine

## 2022-05-02 ENCOUNTER — Encounter: Payer: Self-pay | Admitting: Family Medicine

## 2022-05-02 VITALS — BP 122/88 | HR 87 | Temp 97.9°F | Resp 18 | Ht 67.0 in

## 2022-05-02 DIAGNOSIS — L89151 Pressure ulcer of sacral region, stage 1: Secondary | ICD-10-CM

## 2022-05-02 DIAGNOSIS — E039 Hypothyroidism, unspecified: Secondary | ICD-10-CM | POA: Diagnosis not present

## 2022-05-02 DIAGNOSIS — R296 Repeated falls: Secondary | ICD-10-CM | POA: Diagnosis present

## 2022-05-02 DIAGNOSIS — Z8673 Personal history of transient ischemic attack (TIA), and cerebral infarction without residual deficits: Secondary | ICD-10-CM

## 2022-05-02 MED ORDER — NONFORMULARY OR COMPOUNDED ITEM: 0 refills | Status: AC

## 2022-05-02 NOTE — Patient Instructions (Signed)
Pressure Injury  A pressure injury is damage to the skin and underlying tissue that results from pressure being applied to an area of the body. It often affects people who must spend a long time in a bed or chair because of a medical condition. Pressure injuries usually occur: Over bony parts of the body, such as the tailbone, shoulders, elbows, hips, heels, spine, ankles, and back of the head. Under medical devices that make contact with the body, such as respiratory equipment, stockings, tubes, and splints. Pressure injuries start as reddened areas on the skin and can lead to pain and an open wound. What are the causes? This condition is caused by frequent or constant pressure to an area of the body. Decreased blood flow to the skin can eventually cause the skin tissue to die and break down, causing a wound. What increases the risk? You are more likely to develop this condition if you: Are in the hospital or an extended care facility. Are bedridden or in a wheelchair. Have an injury or disease that keeps you from: Moving normally. Feeling pain or pressure. Have a condition that: Makes you sleepy or less alert. Causes poor blood flow. Need to wear a medical device. Have poor control of your bladder or bowel functions (incontinence). Have poor nutrition (malnutrition). If you are at risk for pressure injuries, your health care provider may recommend certain types of mattresses, mattress covers, pillows, cushions, or boots to help prevent them. These may include products filled with air, foam, gel, or sand. What are the signs or symptoms? Symptoms of this condition depend on the severity of the injury. Symptoms may include: Red or dark areas of the skin. Pain, warmth, or a change of skin texture. Blisters. An open wound. How is this diagnosed? This condition is diagnosed with a medical history and physical exam. You may also have tests, such as: Blood tests. Imaging tests. Blood flow  tests. Your pressure injury will be staged based on its severity. Staging is based on: The depth of the tissue injury, including whether there is exposure of muscle, bone, or tendon. The cause of the pressure injury. How is this treated? This condition may be treated by: Relieving or redistributing pressure on your skin. This includes: Frequently changing your position. Avoiding positions that caused the wound or that can make the wound worse. Using specific bed mattresses, chair cushions, or protective boots. Moving medical devices from an area of pressure, or placing padding between the skin and the device. Using foams, creams, or powders to prevent rubbing (friction) on the skin. Keeping your skin clean and dry. This may include using a skin cleanser or skin barrier as told by your health care provider. Cleaning your injury and removing any dead tissue from the wound (debridement). Placing a bandage (dressing) over your injury. Using medicines for pain or to prevent or treat infection. Surgery may be needed if other treatments are not working or if your injury is very deep. Follow these instructions at home: Wound care Follow instructions from your health care provider about how to take care of your wound. Make sure you: Wash your hands with soap and water before and after you change your bandage (dressing). If soap and water are not available, use hand sanitizer. Change your dressing as told by your health care provider.  Check your wound every day for signs of infection. Have a caregiver do this for you if you are not able. Check for: Redness, swelling, or increased pain. More   fluid or blood. Warmth. Pus or a bad smell. Skin care Keep your skin clean and dry. Gently pat your skin dry. Do not rub or massage your skin. You or a caregiver should check your skin every day for any changes in color or any new blisters or sores (ulcers). Medicines Take over-the-counter and prescription  medicines only as told by your health care provider. If you were prescribed an antibiotic medicine, take or apply it as told by your health care provider. Do not stop using the antibiotic even if your condition improves. Reducing and redistributing pressure Do not lie or sit in one position for a long time. Move or change position every 1-2 hours, or as told by your health care provider. Use pillows or cushions to reduce pressure. Ask your health care provider to recommend cushions or pads for you. General instructions  Eat a healthy diet that includes lots of protein. Drink enough fluid to keep your urine pale yellow. Be as active as you can every day. Ask your health care provider to suggest safe exercises or activities. Do not abuse drugs or alcohol. Do not use any products that contain nicotine or tobacco, such as cigarettes, e-cigarettes, and chewing tobacco. If you need help quitting, ask your health care provider. Keep all follow-up visits as told by your health care provider. This is important. Contact a health care provider if: You have: A fever or chills. Pain that is not helped by medicine. Any changes in skin color. New blisters or sores. Pus or a bad smell coming from your wound. Redness, swelling, or pain around your wound. More fluid or blood coming from your wound. Your wound does not improve after 1-2 weeks of treatment. Summary A pressure injury is damage to the skin and underlying tissue that results from pressure being applied to an area of the body. Do not lie or sit in one position for a long time. Your health care provider may advise you to move or change position every 1-2 hours. Follow instructions from your health care provider about how to take care of your wound. Keep all follow-up visits as told by your health care provider. This is important. This information is not intended to replace advice given to you by your health care provider. Make sure you discuss  any questions you have with your health care provider. Document Revised: 07/29/2021 Document Reviewed: 07/29/2021 Elsevier Patient Education  2023 Elsevier Inc.  

## 2022-05-02 NOTE — Progress Notes (Signed)
Subjective:   By signing my name below, I, [name], attest that this documentation has been prepared under the direction and in the presence of [name] 05/02/2022   Patient ID: Belinda Anderson, female    DOB: 1932/03/17, 88 y.o.   MRN: [id number]  No chief complaint on file.   HPI Patient is in today for an office visit. She is accompanied by her daughter.   Her daughter reports that the patient had two recent falls. One of her falls involved her falling in the bathroom and hit her backside which resulted in back pain. She struggles to make long walks and is not aware if she's standing on her left foot. As a result, her daughter is requesting for her to have a wheelchair.   Her daughter is not concerned with the patient having an UTI. She is interested in receiving a UTI testing cup to keep in the patients room   Her daughter is afraid of the patient possibly developing a tear/sore in her intergluteal cleft. The patient also has thin skin and redness in the area. The medical aids at her location is looking after her skin.   Past Medical History:  Diagnosis Date  . Arthritis   . Atrial fibrillation ([place])   . Hypertension   . Thyroid disease   . TIA (transient ischemic attack)     Past Surgical History:  Procedure Laterality Date  . CARDIOVERSION N/A 04/15/2020   Procedure: CARDIOVERSION;  Surgeon: Elouise Munroe, MD;  Location: [facility] ENDOSCOPY;  Service: Cardiovascular;  Laterality: N/A;  . EYE SURGERY     lens implant  . IR ANGIO VERTEBRAL SEL SUBCLAVIAN INNOMINATE UNI R MOD SED  09/30/2019  . IR CT HEAD LTD  09/30/2019  . IR PERCUTANEOUS ART THROMBECTOMY/INFUSION INTRACRANIAL INC DIAG ANGIO  09/30/2019  . RADIOLOGY WITH ANESTHESIA N/A 09/30/2019   Procedure: IR WITH ANESTHESIA;  Surgeon: Luanne Bras, MD;  Location: [facility];  Service: Radiology;  Laterality: N/A;  . TEE WITHOUT CARDIOVERSION N/A 04/15/2020   Procedure: TRANSESOPHAGEAL ECHOCARDIOGRAM  (TEE);  Surgeon: Elouise Munroe, MD;  Location[facility];  Service: Cardiovascular;  Laterality: N/A;    Family History  Problem Relation Age of Onset  . Arthritis Mother   . Heart disease Mother   . Heart disease Father 84       MI  . Coronary artery disease Other   . Diabetes Brother        DI    Social History   Socioeconomic History  . Marital status: Widowed    Spouse name: Not on file  . Number of children: 3  . Years of education: 52  . Highest education level: 12th grade  Occupational History  . Not on file  Tobacco Use  . Smoking status: Never  . Smokeless tobacco: Never  Vaping Use  . Vaping Use: Never used  Substance and Sexual Activity  . Alcohol use: No  . Drug use: No  . Sexual activity: Not Currently    Partners: Male  Other Topics Concern  . Not on file  Social History Narrative   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, TEFL teacher   Social Determinants of Health   Financial Resource Strain: Low Risk  (04/29/2021)   Overall Financial Resource Strain (CARDIA)   . Difficulty of Paying Living Expenses: Not hard at all  Food Insecurity: No Food Insecurity (04/29/2021)   Hunger Vital Sign   . Worried About  Running Out of Food in the Last Year: Never true   . Ran Out of Food in the Last Year: Never true  Transportation Needs: No Transportation Needs (04/29/2021)   PRAPARE - Transportation   . Lack of Transportation (Medical): No   . Lack of Transportation (Non-Medical): No  Physical Activity: Insufficiently Active (04/29/2021)   Exercise Vital Sign   . Days of Exercise per Week: 3 days   . Minutes of Exercise per Session: 20 min  Stress: No Stress Concern Present (04/29/2021)   [place]   . Feeling of Stress : Only a little  Social Connections: Moderately Isolated (04/29/2021)   Social Connection and Isolation Panel [NHANES]   . Frequency of Communication with  Friends and Family: More than three times a week   . Frequency of Social Gatherings with Friends and Family: More than three times a week   . Attends Religious Services: More than 4 times per year   . Active Member of Clubs or Organizations: No   . Attends Archivist Meetings: Never   . Marital Status: Widowed  Intimate Partner Violence: Not At Risk (04/29/2021)   Humiliation, Afraid, Rape, and Kick questionnaire   . Fear of Current or Ex-Partner: No   . Emotionally Abused: No   . Physically Abused: No   . Sexually Abused: No    Outpatient Medications Prior to Visit  Medication Sig Dispense Refill  . acetaminophen (TYLENOL) 650 MG CR tablet Take 1,300 mg by mouth daily as needed for pain (arthritis).     . Carboxymethylcellulose Sodium (REFRESH LIQUIGEL) 1 % GEL Apply 1 drop to eye at bedtime. 15 mL 11  . cephALEXin (KEFLEX) 500 MG capsule Take 1 capsule (500 mg total) by mouth 2 (two) times daily. 20 capsule 0  . diclofenac Sodium (VOLTAREN) 1 % GEL Apply 2 g topically daily as needed (pain). 150 g 3  . diltiazem (CARDIZEM) 60 MG tablet Take 1 tablet (60 mg total) by mouth daily. 30 tablet 3  . furosemide (LASIX) 20 MG tablet Take 2 tablets (40 mg total) by mouth daily. MAY TAKE AN ADDITIONAL TABLET AS NEEDED FOR SWELLING. 270 tablet 2  . Glycerin-Hypromellose-PEG 400 ([facility] DRY EYE RELIEF) 0.2-0.2-1 % SOLN Place 1 drop into both eyes daily as needed (Dry eye).    . haloperidol (HALDOL) 1 MG tablet Take 1 tablet (1 mg total) by mouth 2 (two) times daily. 60 tablet 1  . levothyroxine (SYNTHROID) 75 MCG tablet TAKE 1 TABLET(75 MCG) BY MOUTH DAILY 90 tablet 0  . NONFORMULARY OR COMPOUNDED ITEM rollator walker  #1  As directed -------  Dx  CHF,  DOE,  OA 1 each 0  . NONFORMULARY OR COMPOUNDED ITEM Standing orders for Urinalysis and urine culture 1 each 0  . nystatin (MYCOSTATIN/NYSTOP) powder Apply 1 application. topically 3 (three) times daily. 60 g 3  . Polyethyl Glyc-Propyl Glyc PF  (SYSTANE HYDRATION PF) 0.4-0.3 % SOLN Apply 1 drop to eye in the morning, at noon, in the evening, and at bedtime. 30 each 11  . potassium chloride SA (KLOR-CON) 20 MEQ tablet Take 1 tablet (20 mEq total) by mouth daily. 90 tablet 3  . Rivaroxaban (XARELTO) 15 MG TABS tablet TAKE 1 TABLET(15 MG) BY MOUTH DAILY WITH BREAKFAST. DISCONTINUE ELIQUIS 90 tablet 2  . sertraline (ZOLOFT) 50 MG tablet TAKE 1 TABLET(50 MG) BY MOUTH DAILY 90 tablet 3  . spironolactone (ALDACTONE) 25 MG tablet Take  0.5 tablets (12.5 mg total) by mouth every other day. 15 tablet 3   No facility-administered medications prior to visit.    No Known Allergies  Review of Systems  Constitutional:  Negative for fever and malaise/fatigue.  HENT:  Negative for congestion.   Eyes:  Negative for blurred vision.  Respiratory:  Negative for shortness of breath.   Cardiovascular:  Negative for chest pain, palpitations and leg swelling.  Gastrointestinal:  Negative for abdominal pain, blood in stool and nausea.  Genitourinary:  Negative for dysuria and frequency.  Musculoskeletal:  Positive for back pain. Negative for falls.  Skin:  Negative for rash.       (+) tear/sore in intergluteal cleft  Neurological:  Positive for weakness. Negative for dizziness, loss of consciousness and headaches.  Endo/Heme/Allergies:  Negative for environmental allergies.  Psychiatric/Behavioral:  Negative for depression. The patient is not nervous/anxious.        Objective:    Physical Exam Vitals and nursing note reviewed.  Constitutional:      General: She is not in acute distress.    Appearance: Normal appearance. She is well-developed. She is not ill-appearing.  HENT:     Head: Normocephalic and atraumatic.     Right Ear: External ear normal.     Left Ear: External ear normal.  Eyes:     Extraocular Movements: Extraocular movements intact.     Conjunctiva/sclera: Conjunctivae normal.     Pupils: Pupils are equal, round, and reactive  to light.  Neck:     Thyroid: No thyromegaly.     Vascular: No carotid bruit or JVD.  Cardiovascular:     Rate and Rhythm: Normal rate and regular rhythm.     Heart sounds: Normal heart sounds. No murmur heard.    No gallop.  Pulmonary:     Effort: Pulmonary effort is normal. No respiratory distress.     Breath sounds: Normal breath sounds. No wheezing or rales.  Chest:     Chest wall: No tenderness.  Musculoskeletal:     Cervical back: Normal range of motion and neck supple.  Skin:    General: Skin is warm and dry.     Findings: Lesion present.     Comments: stage 1 sacral decubitus ulcer  Neurological:     Mental Status: She is alert and oriented to person, place, and time.  Psychiatric:        Judgment: Judgment normal.    There were no vitals taken for this visit. Wt Readings from Last 3 Encounters:  01/26/22 144 lb (65.3 kg)  11/22/21 147 lb (66.7 kg)  08/21/21 165 lb 2 oz (74.9 kg)    Diabetic Foot Exam - Simple   No data filed    Lab Results  Component Value Date   WBC 6.7 01/17/2022   HGB 15.2 (H) 01/17/2022   HCT 45.8 01/17/2022   PLT 233.0 01/17/2022   GLUCOSE 96 01/17/2022   CHOL 177 09/13/2021   TRIG 128.0 09/13/2021   HDL 40.30 09/13/2021   LDLDIRECT 97.0 04/06/2018   LDLCALC 112 (H) 09/13/2021   ALT 10 01/17/2022   AST 19 01/17/2022   NA 143 01/17/2022   K 3.7 01/17/2022   CL 104 01/17/2022   CREATININE 1.53 (H) 01/17/2022   BUN 19 01/17/2022   CO2 28 01/17/2022   TSH 1.38 01/17/2022   INR 1.6 (H) 02/04/2021   HGBA1C 6.1 11/26/2020    Lab Results  Component Value Date   TSH 1.38 01/17/2022  Lab Results  Component Value Date   WBC 6.7 01/17/2022   HGB 15.2 (H) 01/17/2022   HCT 45.8 01/17/2022   MCV 94.9 01/17/2022   PLT 233.0 01/17/2022   Lab Results  Component Value Date   NA 143 01/17/2022   K 3.7 01/17/2022   CO2 28 01/17/2022   GLUCOSE 96 01/17/2022   BUN 19 01/17/2022   CREATININE 1.53 (H) 01/17/2022   BILITOT 0.8  01/17/2022   ALKPHOS 113 01/17/2022   AST 19 01/17/2022   ALT 10 01/17/2022   PROT 6.3 01/17/2022   ALBUMIN 4.2 01/17/2022   CALCIUM 9.5 01/17/2022   ANIONGAP 8 08/21/2021   EGFR 28 (L) 07/22/2021   GFR 30.37 (L) 01/17/2022   Lab Results  Component Value Date   CHOL 177 09/13/2021   Lab Results  Component Value Date   HDL 40.30 09/13/2021   Lab Results  Component Value Date   LDLCALC 112 (H) 09/13/2021   Lab Results  Component Value Date   TRIG 128.0 09/13/2021   Lab Results  Component Value Date   CHOLHDL 4 09/13/2021   Lab Results  Component Value Date   HGBA1C 6.1 11/26/2020       Assessment & Plan:   Problem List Items Addressed This Visit   None   No orders of the defined types were placed in this encounter.   I, [name], personally preformed the services described in this documentation.  All medical record entries made by the scribe were at my direction and in my presence.  I have reviewed the chart and discharge instructions (if applicable) and agree that the record reflects my personal performance and is accurate and complete. 05/02/2022   I,Amber Collins,acting as a scribe for [employer], DO.,have documented all relevant documentation on the behalf of [name], DO,as directed by  [name], DO while in the presence of [name], DO.   [employer]

## 2022-05-03 LAB — TSH: TSH: 2.28 u[IU]/mL (ref 0.35–5.50)

## 2022-05-03 LAB — LIPID PANEL
Cholesterol: 154 mg/dL (ref 0–200)
HDL: 38 mg/dL — ABNORMAL LOW (ref >39.00)
LDL Cholesterol: 88 mg/dL (ref 0–99)
NonHDL: 115.5
Total CHOL/HDL Ratio: 4
Triglycerides: 138 mg/dL (ref 0.0–149.0)
VLDL: 27.6 mg/dL (ref 0.0–40.0)

## 2022-05-03 LAB — CBC WITH DIFFERENTIAL/PLATELET
Basophils Absolute: 0 10*3/uL (ref 0.0–0.1)
Basophils Relative: 0.6 % (ref 0.0–3.0)
Eosinophils Absolute: 0 10*3/uL (ref 0.0–0.7)
Eosinophils Relative: 0.7 % (ref 0.0–5.0)
HCT: 41.5 % (ref 36.0–46.0)
Hemoglobin: 13.9 g/dL (ref 12.0–15.0)
Lymphocytes Relative: 20.8 % (ref 12.0–46.0)
Lymphs Abs: 1.5 10*3/uL (ref 0.7–4.0)
MCHC: 33.4 g/dL (ref 30.0–36.0)
MCV: 95.4 fl (ref 78.0–100.0)
Monocytes Absolute: 0.6 10*3/uL (ref 0.1–1.0)
Monocytes Relative: 7.9 % (ref 3.0–12.0)
Neutro Abs: 5 10*3/uL (ref 1.4–7.7)
Neutrophils Relative %: 70 % (ref 43.0–77.0)
Platelets: 242 10*3/uL (ref 150.0–400.0)
RBC: 4.35 Mil/uL (ref 3.87–5.11)
RDW: 14.9 % (ref 11.5–15.5)
WBC: 7.2 10*3/uL (ref 4.0–10.5)

## 2022-05-03 LAB — COMPREHENSIVE METABOLIC PANEL
ALT: 8 U/L (ref 0–35)
AST: 17 U/L (ref 0–37)
Albumin: 4 g/dL (ref 3.5–5.2)
Alkaline Phosphatase: 104 U/L (ref 39–117)
BUN: 19 mg/dL (ref 6–23)
CO2: 28 mEq/L (ref 19–32)
Calcium: 9 mg/dL (ref 8.4–10.5)
Chloride: 104 mEq/L (ref 96–112)
Creatinine, Ser: 1.47 mg/dL — ABNORMAL HIGH (ref 0.40–1.20)
GFR: 31.8 mL/min — ABNORMAL LOW (ref >60.00)
Glucose, Bld: 123 mg/dL — ABNORMAL HIGH (ref 70–99)
Potassium: 3.5 mEq/L (ref 3.5–5.1)
Sodium: 141 mEq/L (ref 135–145)
Total Bilirubin: 0.6 mg/dL (ref 0.2–1.2)
Total Protein: 6.1 g/dL (ref 6.0–8.3)

## 2022-05-25 ENCOUNTER — Encounter: Payer: Self-pay | Admitting: Podiatry

## 2022-05-25 ENCOUNTER — Encounter: Payer: Self-pay | Admitting: Family Medicine

## 2022-06-22 ENCOUNTER — Non-Acute Institutional Stay: Payer: Medicare Other | Admitting: Internal Medicine

## 2022-06-22 ENCOUNTER — Encounter: Payer: Self-pay | Admitting: Internal Medicine

## 2022-06-22 VITALS — BP 118/90 | HR 77 | Temp 98.2°F | Resp 18 | Wt 135.5 lb

## 2022-06-22 DIAGNOSIS — N1832 Chronic kidney disease, stage 3b: Secondary | ICD-10-CM

## 2022-06-22 DIAGNOSIS — Z515 Encounter for palliative care: Secondary | ICD-10-CM

## 2022-06-22 DIAGNOSIS — F01C3 Vascular dementia, severe, with mood disturbance: Secondary | ICD-10-CM

## 2022-06-22 DIAGNOSIS — I5042 Chronic combined systolic (congestive) and diastolic (congestive) heart failure: Secondary | ICD-10-CM

## 2022-06-22 NOTE — Progress Notes (Signed)
[facility] Palliative Care Follow-Up Visit Telephone: [phone]  Fax: [phone]   Date of encounter: 06/22/22 12:38 PM PATIENT NAME: Belinda Anderson [address] [place] [address]   [phone] (home)  DOB: 02-May-1932 MRN: [id number] PRIMARY CARE PROVIDER:    [name], DO,  [address] [place] [address] [phone]  REFERRING PROVIDER:   [name], DO [address] [place],  [place] [address] [phone]  RESPONSIBLE PARTY:    Contact Information     Name Relation Home Work [place] Daughter [phone]  [phone]   Lakendra, [place]   [phone]        I met face to face with patient and family in her facility. Palliative Care was asked to follow this patient by consultation request of  [name], * to address advance care planning and complex medical decision making. This is follow-up visit.                                     ASSESSMENT AND PLAN / RECOMMENDATIONS:   Advance Care Planning/Goals of Care: Goals include to maximize quality of life and symptom management. Patient/health care surrogate gave his/her permission to discuss.Our advance care planning conversation included a discussion about:    The value and importance of advance care planning  Experiences with loved ones who have been seriously ill or have died  Exploration of personal, cultural or spiritual beliefs that might influence medical decisions  Exploration of goals of care in the event of a sudden injury or illness  Identification  of a healthcare agent  Review and updating or creation of an  advance directive document . Decision not to resuscitate or to de-escalate disease focused treatments due to poor prognosis. CODE STATUS:  DNR; MOST never got done at home before admission as intended.  Symptom Management/Plan: 1. Severe vascular dementia with mood  disturbance ([place]) -pt looks actually much better this visit than when I saw her last at which point she was lying in bed and minimally responded to me (apparently bad day and too early in am)--she was sitting up in a chair in her room, well dressed and groomed, talking with me, smiling and sociable -I'm told she has periods of tearfulness more often -requires a wheelchair now for mobility as she's struggled to ambulate with her walker over the past 3 mos -she eats 75-80% of her meals, has had no aspiration pneumonias or other infections per her caregivers -appears she's lost 9 lbs since april  2. Chronic combined systolic and diastolic CHF (congestive heart failure) (HCC) -no signs of acute chf, minimal nonpitting edema at this time even sitting up in chair upright  3. Stage 3b chronic kidney disease ([place]) -avoid nephrotoxic agents, careful balance with CHF at this point making her care challenging should she get an infection, high risk for sudden decline  4. Palliative care encounter -will continue to follow over time, need to get with her daughters to complete MOST officially for her record and to help guide future decisions   Follow up Palliative Care Visit: Palliative care will continue to follow for complex medical decision making, advance care planning, and clarification of goals. Return 12 weeks or prn.   This visit was coded based on medical decision making (MDM).  PPS: 40%  HOSPICE ELIGIBILITY/DIAGNOSIS: nearing/cerebrovascular disease with vascular dementia, weight loss  Chief Complaint: Follow-up palliative visit  HISTORY OF PRESENT ILLNESS:  Belinda Anderson is a 88 y.o. year old female  with h/o stroke, afib, htn, chronic diastolic chf, CKD, hypothyroidism, OA, prior sacral pressure injury and frequent falls among others seen today for palliative f/u.   The nursing staff report that she is no longer ambulatory with a walker, now using a wheelchair for mobility.  She has had  a decline in walking ability over the past three mos.  She is still eating 75-80% of meals, but has lost some weight from trend in epic.  She's had no infections reported.  No skin injuries at present.  She is crying more often not always appropriately.  History obtained from review of EMR, discussion with primary team, and interview with family, facility staff/caregiver and/or Ms. Kochan.   I reviewed available labs, medications, imaging, studies and related documents from the EMR.  Records reviewed and summarized above.   ROS Review of Systems  Constitutional:  Positive for activity change, appetite change, fatigue and unexpected weight change. Negative for chills and fever.  HENT:  Negative for trouble swallowing.   Respiratory:  Negative for chest tightness and shortness of breath.   Cardiovascular:  Negative for palpitations and leg swelling.  Gastrointestinal:  Negative for constipation.  Genitourinary:  Negative for dysuria.       Incontinence  Musculoskeletal:  Positive for gait problem. Negative for arthralgias.       Denies any pain  Neurological:  Positive for weakness. Negative for dizziness.  Psychiatric/Behavioral:  Positive for confusion. Negative for sleep disturbance.        Tearfulness     Physical Exam: Vitals:   06/22/22 1237  BP: (!) 118/90  Pulse: 77  Resp: 18  Temp: 98.2 F (36.8 C)  SpO2: 97%  Weight: 135 lb 8 oz (61.5 kg)   Body mass index is 21.22 kg/m. Wt Readings from Last 500 Encounters:  06/22/22 135 lb 8 oz (61.5 kg)  01/26/22 144 lb (65.3 kg)  11/22/21 147 lb (66.7 kg)  08/21/21 165 lb 2 oz (74.9 kg)  07/22/21 147 lb 6.4 oz (66.9 kg)  05/07/21 149 lb 6.4 oz (67.8 kg)  04/23/21 149 lb (67.6 kg)  04/01/21 145 lb 9.6 oz (66 kg)  03/09/21 153 lb 6.4 oz (69.6 kg)  03/01/21 160 lb 6.4 oz (72.8 kg)  02/16/21 157 lb 3.2 oz (71.3 kg)  02/08/21 151 lb 14.4 oz (68.9 kg)  11/05/20 154 lb 12.8 oz (70.2 kg)  10/22/20 156 lb (70.8 kg)  10/05/20 157 lb  6.4 oz (71.4 kg)  09/02/20 152 lb (68.9 kg)  06/25/20 148 lb 6.4 oz (67.3 kg)  05/06/20 150 lb (68 kg)  04/28/20 148 lb 9.6 oz (67.4 kg)  04/22/20 150 lb 3.2 oz (68.1 kg)  04/15/20 151 lb (68.5 kg)  04/08/20 155 lb 9.6 oz (70.6 kg)  04/06/20 151 lb (68.5 kg)  04/01/20 151 lb (68.5 kg)  02/27/20 145 lb 3.2 oz (65.9 kg)  01/30/20 147 lb 9.6 oz (67 kg)  12/03/19 145 lb 6.4 oz (66 kg)  12/02/19 144 lb (65.3 kg)  10/21/19 146 lb 9.6 oz (66.5 kg)  10/16/19 145 lb (65.8 kg)  10/08/19 148 lb 6.4 oz (67.3 kg)  09/30/19 149 lb (67.6 kg)  05/10/19 151 lb 3.2 oz (68.6 kg)  11/16/18 155 lb (70.3 kg)  08/10/18 152 lb (68.9 kg)  07/31/18 152 lb 3.2  oz (69 kg)  07/17/18 156 lb 9.6 oz (71 kg)  04/06/18 147 lb (66.7 kg)  04/06/18 147 lb 6.4 oz (66.9 kg)  03/29/18 140 lb (63.5 kg)  03/01/18 145 lb (65.8 kg)  09/18/17 150 lb (68 kg)  06/20/17 146 lb (66.2 kg)  04/14/17 145 lb (65.8 kg)  03/15/17 143 lb (64.9 kg)  11/23/16 146 lb (66.2 kg)  09/22/16 146 lb 12.8 oz (66.6 kg)  03/15/16 146 lb 9.6 oz (66.5 kg)  12/29/15 148 lb 12.8 oz (67.5 kg)  12/16/14 150 lb 6.4 oz (68.2 kg)  07/16/14 151 lb 9.6 oz (68.8 kg)  02/17/14 158 lb (71.7 kg)  07/10/13 160 lb (72.6 kg)  06/19/13 160 lb (72.6 kg)  04/08/13 158 lb 3.2 oz (71.8 kg)  02/18/13 163 lb 12.8 oz (74.3 kg)  08/20/12 165 lb 12.8 oz (75.2 kg)  02/16/12 161 lb 6.4 oz (73.2 kg)  01/28/11 167 lb (75.8 kg)   Physical Exam Constitutional:      General: She is not in acute distress. HENT:     Head: Normocephalic and atraumatic.  Eyes:     Conjunctiva/sclera: Conjunctivae normal.     Pupils: Pupils are equal, round, and reactive to light.  Cardiovascular:     Rate and Rhythm: Rhythm irregular.     Heart sounds: Murmur heard.  Pulmonary:     Effort: Pulmonary effort is normal.     Breath sounds: Normal breath sounds. No rales.  Abdominal:     General: Bowel sounds are normal.     Palpations: Abdomen is soft.  Musculoskeletal:      Right lower leg: No edema.     Left lower leg: No edema.     Comments: Sitting up in chair with legs crossed holding head in hand  Skin:    General: Skin is warm and dry.  Neurological:     Mental Status: She is alert.  Psychiatric:        Mood and Affect: Mood normal.     Comments: Not tearful during my visit, was pleasant and conversive though not clear if responses accurate     CURRENT PROBLEM LIST:  Patient Active Problem List   Diagnosis Date Noted   Frequent falls 10/28/2021   Joint swelling 09/13/2021   Friction blisters of the skin 09/13/2021   Pressure ulcer of sacral region, stage 1 04/23/2021   Intertrigo 04/23/2021   Abnormal urine odor 04/23/2021   Left foot pain 02/10/2021   Weakness 02/10/2021   Urinary tract infection without hematuria 02/10/2021   Cellulitis 02/10/2021   Dementia ([place]) 02/10/2021   Gout 02/10/2021   SOB (shortness of breath) 02/05/2021   CKD (chronic kidney disease), stage III ([place]) 02/05/2021   Anxiety 12/24/2020   Impacted cerumen of right ear 12/24/2020   Acute on chronic diastolic CHF (congestive heart failure) ([place]) 11/26/2020   Bilateral leg edema 01/30/2020   COVID-19 virus infection 11/21/2019   Cough 11/21/2019   External hemorrhoid, bleeding 10/12/2019   AF (paroxysmal atrial fibrillation) ([place]) 10/02/2019   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 ([place]) s/p IR R MCA M2 09/30/2019   Middle cerebral artery embolism, right 09/30/2019   Contusion of foot including toes, right, initial encounter 08/10/2018   Primary osteoarthritis of right knee 07/31/2018   Right leg swelling 07/17/2018   Pain of right calf 07/17/2018   Preventative health care 09/18/2017   Low back pain radiating to left leg 11/28/2016   Hyperlipidemia 02/02/2015  Sciatica 07/16/2014   Knee pain, acute 07/16/2014   Lower extremity pain, right 07/16/2014   Skin infection 07/16/2014   Incontinence of urine in female 06/19/2013   Cerebral  aneurysm rupture ([place]) 04/08/2013   LEG EDEMA, RIGHT 07/06/2010   OTHER DRUG ALLERGY 07/06/2010   DIARRHEA 02/12/2010   HEMATURIA, HX OF 11/24/2009   ANEURYSM, HX OF 08/03/2009   B12 deficiency 07/24/2009   ACQUIRED HEMOLYTIC ANEMIA UNSPECIFIED 07/24/2009   SYNCOPE 07/20/2009   DIZZINESS 07/20/2009   Memory loss 07/20/2009   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   Hypothyroidism 12/14/2007   Essential hypertension 12/14/2007   DIVERTICULITIS, HX OF 12/14/2007   GOITER 01/30/2007   HYPERTENSION, BENIGN 01/30/2007   UTERINE POLYP 01/30/2007   THYROIDECTOMY, HX OF 01/30/2007    PAST MEDICAL HISTORY:  Active Ambulatory Problems    Diagnosis Date Noted   GOITER 01/30/2007   Hypothyroidism 12/14/2007   B12 deficiency 07/24/2009   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   ACQUIRED HEMOLYTIC ANEMIA UNSPECIFIED 07/24/2009   HYPERTENSION, BENIGN 01/30/2007   Essential hypertension 12/14/2007   UTERINE POLYP 01/30/2007   SYNCOPE 07/20/2009   DIZZINESS 07/20/2009   Memory loss 07/20/2009   LEG EDEMA, RIGHT 07/06/2010   DIARRHEA 02/12/2010   OTHER DRUG ALLERGY 07/06/2010   ANEURYSM, HX OF 08/03/2009   DIVERTICULITIS, HX OF 12/14/2007   HEMATURIA, HX OF 11/24/2009   THYROIDECTOMY, HX OF 01/30/2007   Cerebral aneurysm rupture ([place]) 04/08/2013   Incontinence of urine in female 06/19/2013   Sciatica 07/16/2014   Knee pain, acute 07/16/2014   Lower extremity pain, right 07/16/2014   Skin infection 07/16/2014   Hyperlipidemia 02/02/2015   Low back pain radiating to left leg 11/28/2016   Preventative health care 09/18/2017   Right leg swelling 07/17/2018   Pain of right calf 07/17/2018   Primary osteoarthritis of right knee 07/31/2018   Contusion of foot including toes, right, initial encounter 08/10/2018   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 ([place]) s/p IR R MCA M2 09/30/2019   Middle cerebral artery embolism, right 09/30/2019   AF (paroxysmal atrial  fibrillation) ([place]) 10/02/2019   External hemorrhoid, bleeding 10/12/2019   COVID-19 virus infection 11/21/2019   Cough 11/21/2019   Bilateral leg edema 01/30/2020   Acute on chronic diastolic CHF (congestive heart failure) ([place]) 11/26/2020   Anxiety 12/24/2020   Impacted cerumen of right ear 12/24/2020   SOB (shortness of breath) 02/05/2021   CKD (chronic kidney disease), stage III ([place]) 02/05/2021   Left foot pain 02/10/2021   Weakness 02/10/2021   Urinary tract infection without hematuria 02/10/2021   Cellulitis 02/10/2021   Dementia ([place]) 02/10/2021   Gout 02/10/2021   Pressure ulcer of sacral region, stage 1 04/23/2021   Intertrigo 04/23/2021   Abnormal urine odor 04/23/2021   Joint swelling 09/13/2021   Friction blisters of the skin 09/13/2021   Frequent falls 10/28/2021   Resolved Ambulatory Problems    Diagnosis Date Noted   Hyperlipidemia associated with type 2 diabetes mellitus ([place]) 10/12/2019   Acute on chronic diastolic (congestive) heart failure ([place]) 02/04/2021   Past Medical History:  Diagnosis Date   Arthritis    Atrial fibrillation (HCC)    Hypertension    Thyroid disease    TIA (transient ischemic attack)     SOCIAL HX:  Social History   Tobacco Use   Smoking status: Never   Smokeless tobacco: Never  Substance Use Topics   Alcohol use: No  ALLERGIES: No Known Allergies    PERTINENT MEDICATIONS:  Outpatient Encounter Medications as of 06/22/2022  Medication Sig   acetaminophen (TYLENOL) 650 MG CR tablet Take 1,300 mg by mouth daily as needed for pain (arthritis).    Carboxymethylcellulose Sodium (REFRESH LIQUIGEL) 1 % GEL Apply 1 drop to eye at bedtime.   cephALEXin (KEFLEX) 500 MG capsule Take 1 capsule (500 mg total) by mouth 2 (two) times daily.   diclofenac Sodium (VOLTAREN) 1 % GEL Apply 2 g topically daily as needed (pain).   diltiazem (CARDIZEM) 60 MG tablet Take 1 tablet (60 mg total) by mouth daily.   furosemide (LASIX) 20 MG  tablet Take 2 tablets (40 mg total) by mouth daily. MAY TAKE AN ADDITIONAL TABLET AS NEEDED FOR SWELLING.   Glycerin-Hypromellose-PEG 400 ([facility] DRY EYE RELIEF) 0.2-0.2-1 % SOLN Place 1 drop into both eyes daily as needed (Dry eye).   haloperidol (HALDOL) 1 MG tablet Take 1 tablet (1 mg total) by mouth 2 (two) times daily.   levothyroxine (SYNTHROID) 75 MCG tablet TAKE 1 TABLET(75 MCG) BY MOUTH DAILY   NONFORMULARY OR COMPOUNDED ITEM rollator walker  #1  As directed -------  Dx  CHF,  DOE,  OA   NONFORMULARY OR COMPOUNDED ITEM Standing orders for Urinalysis and urine culture   NONFORMULARY OR COMPOUNDED ITEM Manual Wheel chair   #1  dx frequent falls,  weakness   nystatin (MYCOSTATIN/NYSTOP) powder Apply 1 application. topically 3 (three) times daily.   Polyethyl Glyc-Propyl Glyc PF (SYSTANE HYDRATION PF) 0.4-0.3 % SOLN Apply 1 drop to eye in the morning, at noon, in the evening, and at bedtime.   potassium chloride SA (KLOR-CON) 20 MEQ tablet Take 1 tablet (20 mEq total) by mouth daily.   Rivaroxaban (XARELTO) 15 MG TABS tablet TAKE 1 TABLET(15 MG) BY MOUTH DAILY WITH BREAKFAST. DISCONTINUE ELIQUIS   sertraline (ZOLOFT) 50 MG tablet TAKE 1 TABLET(50 MG) BY MOUTH DAILY   spironolactone (ALDACTONE) 25 MG tablet Take 0.5 tablets (12.5 mg total) by mouth every other day.   No facility-administered encounter medications on file as of 06/22/2022.    Thank you for the opportunity to participate in the care of Ms. Goodlin.  The palliative care team will continue to follow. Please call our office at [phone] if we can be of additional assistance.   Hollace Kinnier, DO  COVID-19 PATIENT SCREENING TOOL Asked and negative response unless otherwise noted:  Have you had symptoms of covid, tested positive or been in contact with someone with symptoms/positive test in the past 5-10 days? No

## 2022-07-07 ENCOUNTER — Telehealth: Payer: Self-pay | Admitting: Family Medicine

## 2022-07-07 ENCOUNTER — Other Ambulatory Visit (HOSPITAL_COMMUNITY): Payer: Self-pay

## 2022-07-07 NOTE — Telephone Encounter (Signed)
FYI

## 2022-07-07 NOTE — Telephone Encounter (Signed)
[facility] called to let us know that patient fell today and there were no injuries, just her bp is elevated at 148/104.

## 2022-07-18 ENCOUNTER — Ambulatory Visit (INDEPENDENT_AMBULATORY_CARE_PROVIDER_SITE_OTHER): Payer: Medicare Other | Admitting: Family Medicine

## 2022-07-18 ENCOUNTER — Ambulatory Visit: Payer: Medicare Other | Admitting: Family Medicine

## 2022-07-18 ENCOUNTER — Encounter: Payer: Self-pay | Admitting: Family Medicine

## 2022-07-18 VITALS — BP 110/80 | HR 91 | Temp 98.0°F | Resp 16 | Ht 67.0 in

## 2022-07-18 DIAGNOSIS — F02818 Dementia in other diseases classified elsewhere, unspecified severity, with other behavioral disturbance: Secondary | ICD-10-CM

## 2022-07-18 DIAGNOSIS — G309 Alzheimer's disease, unspecified: Secondary | ICD-10-CM | POA: Diagnosis not present

## 2022-07-18 DIAGNOSIS — E039 Hypothyroidism, unspecified: Secondary | ICD-10-CM

## 2022-07-18 DIAGNOSIS — F418 Other specified anxiety disorders: Secondary | ICD-10-CM

## 2022-07-18 DIAGNOSIS — F419 Anxiety disorder, unspecified: Secondary | ICD-10-CM

## 2022-07-18 DIAGNOSIS — Z23 Encounter for immunization: Secondary | ICD-10-CM

## 2022-07-18 MED ORDER — LEVOTHYROXINE SODIUM 75 MCG PO TABS: ORAL_TABLET | ORAL | 3 refills | Status: AC

## 2022-07-18 MED ORDER — ENSURE COMPLETE PO LIQD: ORAL | 12 refills | Status: AC

## 2022-07-18 MED ORDER — HALOPERIDOL 1 MG PO TABS: 1.0000 mg | ORAL_TABLET | Freq: Three times a day (TID) | ORAL | 1 refills | Status: AC | PRN

## 2022-07-18 MED ORDER — SERTRALINE HCL 100 MG PO TABS: 100.0000 mg | ORAL_TABLET | Freq: Every day | ORAL | 3 refills | Status: AC

## 2022-07-18 NOTE — Assessment & Plan Note (Signed)
Inc zoloft to 100 mg daily Inc haldol to tid prn  rto if symptoms do not improve or f/u palliative care dr

## 2022-07-18 NOTE — Assessment & Plan Note (Signed)
Lab Results  Component Value Date   TSH 2.28 05/02/2022   Refill synthroid

## 2022-07-18 NOTE — Progress Notes (Signed)
Subjective:   By signing my name below, I, [name], attest that this documentation has been prepared under the direction and in the presence of [name] 07/18/2022   Patient ID: Belinda Anderson, female    DOB: 1932-09-17, 88 y.o.   MRN: [id number]  Chief Complaint  Patient presents with   Hypothyroidism   Anxiety    Pt's daughter states patient cries all the time and doesn't know why   Follow-up    HPI Patient is in today for an office visit. She is accompanied by a family member.   She is requesting a refill of 75 MCG of Synthroid and 100 Mg of Zoloft.   The patient tends to be very confused and upset especially at night. There are periods when she crys. When upset she gets up and takes a fall. She takes her Haldol medication is once in the morning and once at night.   She has a decreased appetite which is causing about 20 lbs in weight loss. The patient is living in a nursing home and receives snacks in between her meals.  Wt Readings from Last 3 Encounters:  06/22/22 135 lb 8 oz (61.5 kg)  01/26/22 144 lb (65.3 kg)  11/22/21 147 lb (66.7 kg)   She is interested in receiving influenza vaccine this visit. Past Medical History:  Diagnosis Date   Arthritis    Atrial fibrillation ([place])    Hypertension    Thyroid disease    TIA (transient ischemic attack)     Past Surgical History:  Procedure Laterality Date   CARDIOVERSION N/A 04/15/2020   Procedure: CARDIOVERSION;  Surgeon: Elouise Munroe, MD;  Location: [facility];  Service: Cardiovascular;  Laterality: N/A;   EYE SURGERY     lens implant   IR ANGIO VERTEBRAL SEL SUBCLAVIAN INNOMINATE UNI R MOD SED  09/30/2019   IR CT HEAD LTD  09/30/2019   IR PERCUTANEOUS ART THROMBECTOMY/INFUSION INTRACRANIAL INC DIAG ANGIO  09/30/2019   RADIOLOGY WITH ANESTHESIA N/A 09/30/2019   Procedure: IR WITH ANESTHESIA;  Surgeon: Luanne Bras, MD;  Location: [facility];  Service: Radiology;  Laterality: N/A;   TEE  WITHOUT CARDIOVERSION N/A 04/15/2020   Procedure: TRANSESOPHAGEAL ECHOCARDIOGRAM (TEE);  Surgeon: Elouise Munroe, MD;  Location: [facility] ENDOSCOPY;  Service: Cardiovascular;  Laterality: N/A;    Family History  Problem Relation Age of Onset   Arthritis Mother    Heart disease Mother    Heart disease Father 61       MI   Coronary artery disease Other    Diabetes Brother        DI    Social History   Socioeconomic History   Marital status: Widowed    Spouse name: Not on file   Number of children: 3   Years of education: 30   Highest education level: 12th grade  Occupational History   Not on file  Tobacco Use   Smoking status: Never   Smokeless tobacco: Never  Vaping Use   Vaping Use: Never used  Substance and Sexual Activity   Alcohol use: No   Drug use: No   Sexual activity: Not Currently    Partners: Male  Other Topics Concern   Not on file  Social History Narrative   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, TEFL teacher   Social Determinants of Health   Financial Resource Strain: Low Risk  (04/29/2021)   Overall Financial Resource Strain (CARDIA)  Difficulty of Paying Living Expenses: Not hard at all  Food Insecurity: No Food Insecurity (04/29/2021)   Hunger Vital Sign    Worried About Running Out of Food in the Last Year: Never true    Ran Out of Food in the Last Year: Never true  Transportation Needs: No Transportation Needs (04/29/2021)   PRAPARE - Hydrologist (Medical): No    Lack of Transportation (Non-Medical): No  Physical Activity: Insufficiently Active (04/29/2021)   Exercise Vital Sign    Days of Exercise per Week: 3 days    Minutes of Exercise per Session: 20 min  Stress: No Stress Concern Present (04/29/2021)   [place]    Feeling of Stress : Only a little  Social Connections: Moderately Isolated (04/29/2021)   Social Connection and Isolation  Panel [NHANES]    Frequency of Communication with Friends and Family: More than three times a week    Frequency of Social Gatherings with Friends and Family: More than three times a week    Attends Religious Services: More than 4 times per year    Active Member of [employer] or Organizations: No    Attends Archivist Meetings: Never    Marital Status: Widowed  Intimate Partner Violence: Not At Risk (04/29/2021)   Humiliation, Afraid, Rape, and Kick questionnaire    Fear of Current or Ex-Partner: No    Emotionally Abused: No    Physically Abused: No    Sexually Abused: No    Outpatient Medications Prior to Visit  Medication Sig Dispense Refill   acetaminophen (TYLENOL) 650 MG CR tablet Take 1,300 mg by mouth daily as needed for pain (arthritis).      Carboxymethylcellulose Sodium (REFRESH LIQUIGEL) 1 % GEL Apply 1 drop to eye at bedtime. 15 mL 11   diclofenac Sodium (VOLTAREN) 1 % GEL Apply 2 g topically daily as needed (pain). 150 g 3   diltiazem (CARDIZEM) 60 MG tablet Take 1 tablet (60 mg total) by mouth daily. 30 tablet 3   furosemide (LASIX) 20 MG tablet Take 2 tablets (40 mg total) by mouth daily. MAY TAKE AN ADDITIONAL TABLET AS NEEDED FOR SWELLING. 270 tablet 2   Glycerin-Hypromellose-PEG 400 ([employer] DRY EYE RELIEF) 0.2-0.2-1 % SOLN Place 1 drop into both eyes daily as needed (Dry eye).     NONFORMULARY OR COMPOUNDED ITEM rollator walker  #1  As directed -------  Dx  CHF,  DOE,  OA 1 each 0   NONFORMULARY OR COMPOUNDED ITEM Standing orders for Urinalysis and urine culture 1 each 0   NONFORMULARY OR COMPOUNDED ITEM Manual Wheel chair   #1  dx frequent falls,  weakness 1 each 0   nystatin (MYCOSTATIN/NYSTOP) powder Apply 1 application. topically 3 (three) times daily. 60 g 3   Polyethyl Glyc-Propyl Glyc PF (SYSTANE HYDRATION PF) 0.4-0.3 % SOLN Apply 1 drop to eye in the morning, at noon, in the evening, and at bedtime. 30 each 11   potassium chloride SA (KLOR-CON) 20 MEQ tablet Take  1 tablet (20 mEq total) by mouth daily. 90 tablet 3   Rivaroxaban (XARELTO) 15 MG TABS tablet TAKE 1 TABLET(15 MG) BY MOUTH DAILY WITH BREAKFAST. DISCONTINUE ELIQUIS 90 tablet 2   spironolactone (ALDACTONE) 25 MG tablet Take 0.5 tablets (12.5 mg total) by mouth every other day. 15 tablet 3   cephALEXin (KEFLEX) 500 MG capsule Take 1 capsule (500 mg total) by mouth 2 (two) times  daily. 20 capsule 0   haloperidol (HALDOL) 1 MG tablet Take 1 tablet (1 mg total) by mouth 2 (two) times daily. 60 tablet 1   levothyroxine (SYNTHROID) 75 MCG tablet TAKE 1 TABLET(75 MCG) BY MOUTH DAILY 90 tablet 0   sertraline (ZOLOFT) 50 MG tablet TAKE 1 TABLET(50 MG) BY MOUTH DAILY 90 tablet 3   No facility-administered medications prior to visit.    No Known Allergies  Review of Systems  Constitutional:  Negative for fever and malaise/fatigue.  HENT:  Negative for congestion.   Eyes:  Negative for blurred vision.  Respiratory:  Negative for shortness of breath.   Cardiovascular:  Negative for chest pain, palpitations and leg swelling.  Gastrointestinal:  Negative for abdominal pain, blood in stool and nausea.  Genitourinary:  Negative for dysuria and frequency.  Musculoskeletal:  Negative for falls.  Skin:  Negative for rash.  Neurological:  Negative for dizziness, loss of consciousness and headaches.  Endo/Heme/Allergies:  Negative for environmental allergies.  Psychiatric/Behavioral:  Positive for depression and memory loss. The patient is nervous/anxious.        Objective:    Physical Exam Vitals and nursing note reviewed.  Constitutional:      General: She is not in acute distress.    Appearance: Normal appearance. She is not ill-appearing.  HENT:     Head: Normocephalic and atraumatic.     Right Ear: External ear normal.     Left Ear: External ear normal.  Eyes:     Extraocular Movements: Extraocular movements intact.     Pupils: Pupils are equal, round, and reactive to light.   Cardiovascular:     Rate and Rhythm: Normal rate and regular rhythm.     Heart sounds: Normal heart sounds. No murmur heard.    No gallop.  Pulmonary:     Effort: Pulmonary effort is normal. No respiratory distress.     Breath sounds: Normal breath sounds. No wheezing or rales.  Skin:    General: Skin is warm and dry.  Neurological:     Mental Status: She is oriented to person, place, and time.  Psychiatric:        Attention and Perception: She is inattentive.        Mood and Affect: Mood is anxious.        Cognition and Memory: Cognition is impaired. Memory is impaired.     BP 110/80 (BP Location: Left Arm, Patient Position: Sitting, Cuff Size: Normal)   Pulse 91   Temp 98 F (36.7 C) (Oral)   Resp 16   Ht '5\' 7"'  (1.702 m)   SpO2 98%   BMI 21.22 kg/m  Wt Readings from Last 3 Encounters:  06/22/22 135 lb 8 oz (61.5 kg)  01/26/22 144 lb (65.3 kg)  11/22/21 147 lb (66.7 kg)    Diabetic Foot Exam - Simple   No data filed    Lab Results  Component Value Date   WBC 7.2 05/02/2022   HGB 13.9 05/02/2022   HCT 41.5 05/02/2022   PLT 242.0 05/02/2022   GLUCOSE 123 (H) 05/02/2022   CHOL 154 05/02/2022   TRIG 138.0 05/02/2022   HDL 38.00 (L) 05/02/2022   LDLDIRECT 97.0 04/06/2018   LDLCALC 88 05/02/2022   ALT 8 05/02/2022   AST 17 05/02/2022   NA 141 05/02/2022   K 3.5 05/02/2022   CL 104 05/02/2022   CREATININE 1.47 (H) 05/02/2022   BUN 19 05/02/2022   CO2 28 05/02/2022   TSH  2.28 05/02/2022   INR 1.6 (H) 02/04/2021   HGBA1C 6.1 11/26/2020    Lab Results  Component Value Date   TSH 2.28 05/02/2022   Lab Results  Component Value Date   WBC 7.2 05/02/2022   HGB 13.9 05/02/2022   HCT 41.5 05/02/2022   MCV 95.4 05/02/2022   PLT 242.0 05/02/2022   Lab Results  Component Value Date   NA 141 05/02/2022   K 3.5 05/02/2022   CO2 28 05/02/2022   GLUCOSE 123 (H) 05/02/2022   BUN 19 05/02/2022   CREATININE 1.47 (H) 05/02/2022   BILITOT 0.6 05/02/2022    ALKPHOS 104 05/02/2022   AST 17 05/02/2022   ALT 8 05/02/2022   PROT 6.1 05/02/2022   ALBUMIN 4.0 05/02/2022   CALCIUM 9.0 05/02/2022   ANIONGAP 8 08/21/2021   EGFR 28 (L) 07/22/2021   GFR 31.80 (L) 05/02/2022   Lab Results  Component Value Date   CHOL 154 05/02/2022   Lab Results  Component Value Date   HDL 38.00 (L) 05/02/2022   Lab Results  Component Value Date   LDLCALC 88 05/02/2022   Lab Results  Component Value Date   TRIG 138.0 05/02/2022   Lab Results  Component Value Date   CHOLHDL 4 05/02/2022   Lab Results  Component Value Date   HGBA1C 6.1 11/26/2020       Assessment & Plan:   Problem List Items Addressed This Visit       Unprioritized   Hypothyroidism    Lab Results  Component Value Date   TSH 2.28 05/02/2022  Refill synthroid      Relevant Medications   levothyroxine (SYNTHROID) 75 MCG tablet   Anxiety    Inc zoloft to 100 mg daily Inc haldol to tid prn  rto if symptoms do not improve or f/u palliative care dr      Relevant Medications   sertraline (ZOLOFT) 100 MG tablet   Other Visit Diagnoses     Depression with anxiety    -  Primary   Relevant Medications   sertraline (ZOLOFT) 100 MG tablet   Alzheimer's dementia with behavioral disturbance (HCC)       Relevant Medications   sertraline (ZOLOFT) 100 MG tablet   haloperidol (HALDOL) 1 MG tablet   Need for influenza vaccination       Relevant Orders   Flu Vaccine QUAD High Dose(Fluad) (Completed)      Meds ordered this encounter  Medications   sertraline (ZOLOFT) 100 MG tablet    Sig: Take 1 tablet (100 mg total) by mouth daily.    Dispense:  30 tablet    Refill:  3   haloperidol (HALDOL) 1 MG tablet    Sig: Take 1 tablet (1 mg total) by mouth every 8 (eight) hours as needed for agitation.    Dispense:  90 tablet    Refill:  1   levothyroxine (SYNTHROID) 75 MCG tablet    Sig: TAKE 1 TABLET(75 MCG) BY MOUTH DAILY    Dispense:  90 tablet    Refill:  3   feeding  supplement, ENSURE COMPLETE, (ENSURE COMPLETE) LIQD    Sig: 1 bottle bid between meals    Dispense:  237 mL    Refill:  12    I, [name], DO, personally preformed the services described in this documentation.  All medical record entries made by the scribe were at my direction and in my presence.  I have reviewed the  chart and discharge instructions (if applicable) and agree that the record reflects my personal performance and is accurate and complete. 07/18/2022   I,Amber Collins,acting as a scribe for [name], DO.,have documented all relevant documentation on the behalf of [name], DO,as directed by  [name], DO while in the presence of [name], DO.    [name], DO

## 2022-07-20 ENCOUNTER — Telehealth: Payer: Self-pay | Admitting: Family Medicine

## 2022-07-20 ENCOUNTER — Ambulatory Visit: Payer: Medicare Other | Admitting: Podiatry

## 2022-07-20 ENCOUNTER — Encounter: Payer: Self-pay | Admitting: Family Medicine

## 2022-07-20 NOTE — Telephone Encounter (Signed)
Please confirm sig for Haldol please

## 2022-07-20 NOTE — Telephone Encounter (Signed)
Lelan Pons (daughter DPR OK) called stating that on the pt's Haldol, the PRN tag needs to be removed as she stated the memory care facility will not give her the meds if that is on there. Lelan Pons needs a corrected Rx sent over in order to make sure pt can get that medication.  F: [phone] E: [email or web address]

## 2022-07-22 ENCOUNTER — Telehealth: Payer: Self-pay | Admitting: Family Medicine

## 2022-07-22 NOTE — Telephone Encounter (Signed)
Patient's daughter would like to request the discontinuation of her mom's Potassium and Vit D medication. She states her mom is fighting them when taking that medication and daughter does not believe it's worth the fight and frustration is causing for her mom. Please advise.

## 2022-07-22 NOTE — Telephone Encounter (Signed)
Faxed back orders to [facility] with corrected sig for medication

## 2022-07-24 ENCOUNTER — Emergency Department (HOSPITAL_BASED_OUTPATIENT_CLINIC_OR_DEPARTMENT_OTHER)
Admission: EM | Admit: 2022-07-24 | Discharge: 2022-07-24 | Disposition: A | Discharge status: Home / Self Care | Payer: Medicare Other | Attending: Emergency Medicine | Admitting: Emergency Medicine

## 2022-07-24 ENCOUNTER — Encounter (HOSPITAL_BASED_OUTPATIENT_CLINIC_OR_DEPARTMENT_OTHER): Payer: Self-pay | Admitting: Emergency Medicine

## 2022-07-24 ENCOUNTER — Emergency Department (HOSPITAL_BASED_OUTPATIENT_CLINIC_OR_DEPARTMENT_OTHER): Payer: Medicare Other

## 2022-07-24 ENCOUNTER — Other Ambulatory Visit: Payer: Self-pay

## 2022-07-24 DIAGNOSIS — W01190A Fall on same level from slipping, tripping and stumbling with subsequent striking against furniture, initial encounter: Secondary | ICD-10-CM | POA: Insufficient documentation

## 2022-07-24 DIAGNOSIS — W19XXXA Unspecified fall, initial encounter: Secondary | ICD-10-CM

## 2022-07-24 DIAGNOSIS — Y92129 Unspecified place in nursing home as the place of occurrence of the external cause: Secondary | ICD-10-CM | POA: Diagnosis not present

## 2022-07-24 DIAGNOSIS — S0083XA Contusion of other part of head, initial encounter: Secondary | ICD-10-CM | POA: Insufficient documentation

## 2022-07-24 DIAGNOSIS — S0990XA Unspecified injury of head, initial encounter: Secondary | ICD-10-CM

## 2022-07-24 DIAGNOSIS — Z7901 Long term (current) use of anticoagulants: Secondary | ICD-10-CM | POA: Insufficient documentation

## 2022-07-24 DIAGNOSIS — N3000 Acute cystitis without hematuria: Secondary | ICD-10-CM | POA: Diagnosis not present

## 2022-07-24 DIAGNOSIS — F0390 Unspecified dementia without behavioral disturbance: Secondary | ICD-10-CM | POA: Diagnosis not present

## 2022-07-24 HISTORY — DX: Dementia in other diseases classified elsewhere, unspecified severity, without behavioral disturbance, psychotic disturbance, mood disturbance, and anxiety: F02.80

## 2022-07-24 LAB — BASIC METABOLIC PANEL
Anion gap: 9 (ref 5–15)
BUN: 29 mg/dL — ABNORMAL HIGH (ref 8–23)
CO2: 26 mmol/L (ref 22–32)
Calcium: 9.2 mg/dL (ref 8.9–10.3)
Chloride: 104 mmol/L (ref 98–111)
Creatinine, Ser: 1.37 mg/dL — ABNORMAL HIGH (ref 0.44–1.00)
GFR, Estimated: 37 mL/min — ABNORMAL LOW (ref >60)
Glucose, Bld: 100 mg/dL — ABNORMAL HIGH (ref 70–99)
Potassium: 3.9 mmol/L (ref 3.5–5.1)
Sodium: 139 mmol/L (ref 135–145)

## 2022-07-24 LAB — URINALYSIS, MICROSCOPIC (REFLEX)

## 2022-07-24 LAB — CBC WITH DIFFERENTIAL/PLATELET
Abs Immature Granulocytes: 0.02 10*3/uL (ref 0.00–0.07)
Basophils Absolute: 0 10*3/uL (ref 0.0–0.1)
Basophils Relative: 0 %
Eosinophils Absolute: 0.1 10*3/uL (ref 0.0–0.5)
Eosinophils Relative: 1 %
HCT: 44.1 % (ref 36.0–46.0)
Hemoglobin: 14.6 g/dL (ref 12.0–15.0)
Immature Granulocytes: 0 %
Lymphocytes Relative: 16 %
Lymphs Abs: 1.2 10*3/uL (ref 0.7–4.0)
MCH: 31.3 pg (ref 26.0–34.0)
MCHC: 33.1 g/dL (ref 30.0–36.0)
MCV: 94.4 fL (ref 80.0–100.0)
Monocytes Absolute: 0.8 10*3/uL (ref 0.1–1.0)
Monocytes Relative: 11 %
Neutro Abs: 5.4 10*3/uL (ref 1.7–7.7)
Neutrophils Relative %: 72 %
Platelets: 198 10*3/uL (ref 150–400)
RBC: 4.67 MIL/uL (ref 3.87–5.11)
RDW: 14.6 % (ref 11.5–15.5)
WBC: 7.5 10*3/uL (ref 4.0–10.5)
nRBC: 0 % (ref 0.0–0.2)

## 2022-07-24 LAB — URINALYSIS, ROUTINE W REFLEX MICROSCOPIC
Bilirubin Urine: NEGATIVE
Glucose, UA: NEGATIVE mg/dL
Hgb urine dipstick: NEGATIVE
Ketones, ur: NEGATIVE mg/dL
Leukocytes,Ua: NEGATIVE
Nitrite: POSITIVE — AB
Protein, ur: NEGATIVE mg/dL
Specific Gravity, Urine: 1.02 (ref 1.005–1.030)
pH: 5.5 (ref 5.0–8.0)

## 2022-07-24 MED ORDER — CEPHALEXIN 500 MG PO CAPS: 500.0000 mg | ORAL_CAPSULE | Freq: Two times a day (BID) | ORAL | 0 refills | Status: AC

## 2022-07-24 MED ORDER — CEPHALEXIN 250 MG PO CAPS
500.0000 mg | ORAL_CAPSULE | Freq: Once | ORAL | Status: AC
  Administered 2022-07-24: 500 mg via ORAL
  Filled 2022-07-24: qty 2

## 2022-07-24 MED ORDER — ACETAMINOPHEN 325 MG PO TABS
650.0000 mg | ORAL_TABLET | Freq: Four times a day (QID) | ORAL | Status: DC | PRN
  Administered 2022-07-24: 650 mg via ORAL
  Filled 2022-07-24: qty 2

## 2022-07-24 NOTE — ED Notes (Signed)
ED Provider at bedside. 

## 2022-07-24 NOTE — Discharge Instructions (Addendum)
Please give the patient her prescribed acetaminophen regularly for the next 3 days.  Go back to using as needed after that.  Please start Keflex for her urinary tract infection.  Hold her Xarelto for 2 days and then go back to using it as prescribed.  Return to the emergency room for any worsening symptoms.

## 2022-07-24 NOTE — ED Notes (Signed)
In and out cath completed. Pt cleaned of stool and given fresh brief.

## 2022-07-24 NOTE — ED Notes (Signed)
Pt transported to CT ?

## 2022-07-24 NOTE — ED Triage Notes (Signed)
Pt had unwitnessed fall at SNF; hit head on either door frame or wall; hematoma to LT side head; + thinners; pt does not indicate pain elsewhere

## 2022-07-24 NOTE — ED Provider Notes (Signed)
[facility] EMERGENCY DEPARTMENT Provider Note   CSN: [id number] Arrival date & time: 07/24/22  1726     History  Chief Complaint  Patient presents with   Belinda Anderson    Belinda Anderson is a 88 y.o. female.  Patient is a 88 year old female who presents after a fall.  She has a history of dementia so history is limited.  She also has a history of atrial fibrillation and is on Xarelto.  Family is at bedside who also give story.  She at times tries to walk to the bathroom on her own without her walker or assistance and she is fallen before related to this.  She was found at her nursing home room on the floor and it was thought that she was trying to walk to the bathroom unassisted.  She has a hematoma to her head.  She has not had any other apparent injuries.  This happened just prior to arrival.  The daughter states that she has been a little bit more confused than normal over the last week but otherwise no changes in her behavior.  No fevers.  No vomiting or diarrhea.       Home Medications Prior to Admission medications   Medication Sig Start Date End Date Taking? Authorizing Provider  cephALEXin (KEFLEX) 500 MG capsule Take 1 capsule (500 mg total) by mouth 2 (two) times daily. 07/24/22  Yes Malvin Johns, MD  acetaminophen (TYLENOL) 650 MG CR tablet Take 1,300 mg by mouth daily as needed for pain (arthritis).     [provider]  Carboxymethylcellulose Sodium (REFRESH LIQUIGEL) 1 % GEL Apply 1 drop to eye at bedtime. 02/02/22   Reed, Tiffany L, DO  diclofenac Sodium (VOLTAREN) 1 % GEL Apply 2 g topically daily as needed (pain). 01/17/22   [name], DO  diltiazem (CARDIZEM) 60 MG tablet Take 1 tablet (60 mg total) by mouth daily. 07/22/21   Baldwin Jamaica, PA-C  feeding supplement, ENSURE COMPLETE, (ENSURE COMPLETE) LIQD 1 bottle bid between meals 07/18/22   [name], [name], DO  furosemide (LASIX) 20 MG tablet Take 2 tablets (40 mg total) by mouth daily.  MAY TAKE AN ADDITIONAL TABLET AS NEEDED FOR SWELLING. 02/02/22   Reed, Tiffany L, DO  Glycerin-Hypromellose-PEG 400 ([facility] DRY EYE RELIEF) 0.2-0.2-1 % SOLN Place 1 drop into both eyes daily as needed (Dry eye).    [provider]  haloperidol (HALDOL) 1 MG tablet Take 1 tablet (1 mg total) by mouth every 8 (eight) hours as needed for agitation. 07/18/22   Roma Schanz R, DO  levothyroxine (SYNTHROID) 75 MCG tablet TAKE 1 TABLET(75 MCG) BY MOUTH DAILY 07/18/22   [name], DO  NONFORMULARY OR COMPOUNDED ITEM rollator walker  #1  As directed -------  Dx  CHF,  DOE,  OA 11/26/20   [name], DO  NONFORMULARY OR COMPOUNDED ITEM Standing orders for Urinalysis and urine culture 01/18/22   [name], DO  NONFORMULARY OR COMPOUNDED ITEM Manual Wheel chair   #1  dx frequent falls,  weakness 05/02/22   [name], [name], DO  nystatin (MYCOSTATIN/NYSTOP) powder Apply 1 application. topically 3 (three) times daily. 01/17/22   [name], [name], DO  Polyethyl Glyc-Propyl Glyc PF (SYSTANE HYDRATION PF) 0.4-0.3 % SOLN Apply 1 drop to eye in the morning, at noon, in the evening, and at bedtime. 02/02/22   Reed, Tiffany L, DO  potassium chloride SA (KLOR-CON)  20 MEQ tablet Take 1 tablet (20 mEq total) by mouth daily. 07/22/21   Sheilah Pigeon, PA-C  Rivaroxaban (XARELTO) 15 MG TABS tablet TAKE 1 TABLET(15 MG) BY MOUTH DAILY WITH BREAKFAST. DISCONTINUE ELIQUIS 07/22/21   Sheilah Pigeon, PA-C  sertraline (ZOLOFT) 100 MG tablet Take 1 tablet (100 mg total) by mouth daily. 07/18/22   [name], DO  spironolactone (ALDACTONE) 25 MG tablet Take 0.5 tablets (12.5 mg total) by mouth every other day. 02/02/22   Reed, Tiffany L, DO      Allergies    Patient has no known allergies.    Review of Systems   Review of Systems  Unable to perform ROS: Dementia    Physical Exam Updated Vital Signs BP (!) 159/134   Pulse 88   Temp 98.2 F (36.8 C) (Oral)   Resp  18   Wt 57.6 kg   SpO2 100%   BMI 19.89 kg/m  Physical Exam Constitutional:      Appearance: She is well-developed.  HENT:     Head: Normocephalic.     Comments: Large hematoma to the left forehead Eyes:     Extraocular Movements: Extraocular movements intact.     Pupils: Pupils are equal, round, and reactive to light.  Neck:     Comments: No noticeable tenderness to the cervical, thoracic or lumbosacral spine Cardiovascular:     Rate and Rhythm: Normal rate and regular rhythm.     Heart sounds: Normal heart sounds.  Pulmonary:     Effort: Pulmonary effort is normal. No respiratory distress.     Breath sounds: Normal breath sounds. No wheezing or rales.  Chest:     Chest wall: No tenderness.  Abdominal:     General: Bowel sounds are normal.     Palpations: Abdomen is soft.     Tenderness: There is no abdominal tenderness. There is no guarding or rebound.  Musculoskeletal:        General: Normal range of motion.     Comments: No pain on palpation or range of motion of the extremities, including the hips  Lymphadenopathy:     Cervical: No cervical adenopathy.  Skin:    General: Skin is warm and dry.     Findings: No rash.  Neurological:     Mental Status: She is alert.     Comments: Patient is awake and alert, no focal deficits noted     ED Results / Procedures / Treatments   Labs (all labs ordered are listed, but only abnormal results are displayed) Labs Reviewed  BASIC METABOLIC PANEL - Abnormal; Notable for the following components:      Result Value   Glucose, Bld 100 (*)    BUN 29 (*)    Creatinine, Ser 1.37 (*)    GFR, Estimated 37 (*)    All other components within normal limits  URINALYSIS, ROUTINE W REFLEX MICROSCOPIC - Abnormal; Notable for the following components:   Nitrite POSITIVE (*)    All other components within normal limits  URINALYSIS, MICROSCOPIC (REFLEX) - Abnormal; Notable for the following components:   Bacteria, UA MANY (*)    All other  components within normal limits  URINE CULTURE  CBC WITH DIFFERENTIAL/PLATELET    EKG None  Radiology CT Cervical Spine Wo Contrast  Result Date: 07/24/2022 CLINICAL DATA:  Status post trauma. EXAM: CT CERVICAL SPINE WITHOUT CONTRAST TECHNIQUE: Multidetector CT imaging of the cervical spine was performed without intravenous contrast. Multiplanar CT image  reconstructions were also generated. RADIATION DOSE REDUCTION: This exam was performed according to the departmental dose-optimization program which includes automated exposure control, adjustment of the mA and/or kV according to patient size and/or use of iterative reconstruction technique. COMPARISON:  August 21, 2021 FINDINGS: Alignment: Normal. Skull base and vertebrae: No acute fracture. No primary bone lesion or focal pathologic process. Soft tissues and spinal canal: No prevertebral fluid or swelling. No visible canal hematoma. Disc levels: Marked severity endplate sclerosis, moderate severity anterior osteophyte formation and moderate to marked severity posterior bony spurring are seen at the levels of C3-C4, C4-C5, C5-C6 and C6-C7. There is marked severity narrowing of the anterior atlantoaxial articulation. Marked severity intervertebral disc space narrowing is seen at C3-C4, C4-C5, C5-C6 and C6-C7. Bilateral marked severity multilevel facet joint hypertrophy is noted. Upper chest: Negative. Other: None. IMPRESSION: 1. No acute fracture or subluxation in the cervical spine. 2. Marked severity multilevel degenerative changes, as described above. Electronically Signed   By: Virgina Norfolk M.D.   On: 07/24/2022 18:25   CT Head Wo Contrast  Result Date: 07/24/2022 CLINICAL DATA:  Status post trauma. EXAM: CT HEAD WITHOUT CONTRAST TECHNIQUE: Contiguous axial images were obtained from the base of the skull through the vertex without intravenous contrast. RADIATION DOSE REDUCTION: This exam was performed according to the departmental  dose-optimization program which includes automated exposure control, adjustment of the mA and/or kV according to patient size and/or use of iterative reconstruction technique. COMPARISON:  None Available. FINDINGS: Brain: There is mild cerebral atrophy with widening of the extra-axial spaces and ventricular dilatation. There are areas of decreased attenuation within the white matter tracts of the supratentorial brain, consistent with microvascular disease changes. Vascular: No hyperdense vessel or unexpected calcification. Skull: Normal. Negative for fracture or focal lesion. Sinuses/Orbits: No acute finding. Other: There is moderate to marked severity left frontal scalp soft tissue swelling. An associated 2.0 cm x 1.6 cm left frontal scalp hematoma is seen. IMPRESSION: 1. Moderate to marked severity left frontal scalp soft tissue swelling with an associated 2.0 cm x 1.6 cm left frontal scalp hematoma. 2. No acute intracranial abnormality. 3. Generalized cerebral atrophy and microvascular disease changes of the supratentorial brain. Electronically Signed   By: Virgina Norfolk M.D.   On: 07/24/2022 18:23    Procedures Procedures    Medications Ordered in ED Medications  acetaminophen (TYLENOL) tablet 650 mg (650 mg Oral Given 07/24/22 1929)  cephALEXin (KEFLEX) capsule 500 mg (500 mg Oral Given 07/24/22 2007)    ED Course/ Medical Decision Making/ A&P                           Medical Decision Making Problems Addressed: Acute cystitis without hematuria: undiagnosed new problem with uncertain prognosis Injury of head, initial encounter: acute illness or injury  Amount and/or Complexity of Data Reviewed Labs: ordered. Decision-making details documented in ED Course. Radiology: ordered. Decision-making details documented in ED Course.  Risk OTC drugs. Prescription drug management. Decision regarding hospitalization.   Patient is a 88 year old female who presents after a fall.  It was  unwitnessed but apparently she has frequent falls trying to go to the bathroom unassisted.  Her main injury seems to be hematoma to her head.  She is on Xarelto.  She had a CT scan of her head and cervical spine.  No acute traumatic injuries were identified.  No evidence of intracranial hemorrhage.  No other injuries were identified on clinical exam.  Labs were obtained given that the daughter reports that she has had a little bit of increased confusion.  There is some suggestions of urinary tract infection.  We will start Keflex.  Her other labs are nonconcerning.  Patient was discharged back to the skilled nursing facility in good condition.  They were advised to hold her Xarelto for 2 days and then go back to using it as prescribed.  She will use acetaminophen for symptomatic relief.  She was prescribed Keflex for her urinary tract infection.  He was encouraged to have a follow-up appointment with her ECP.  Return precautions were given.  Final Clinical Impression(s) / ED Diagnoses Final diagnoses:  Fall, initial encounter  Injury of head, initial encounter  Acute cystitis without hematuria    Rx / DC Orders ED Discharge Orders          Ordered    cephALEXin (KEFLEX) 500 MG capsule  2 times daily        07/24/22 2022              Malvin Johns, MD 07/24/22 2025

## 2022-07-25 ENCOUNTER — Telehealth: Payer: Self-pay | Admitting: Family Medicine

## 2022-07-25 NOTE — Telephone Encounter (Signed)
Spoke with patient daughter to advised that there will be a little wait on getting back with her.  I also asked if she would think a liquid would work if available.  She stated that they will try that to see if it will work.  Will await til [place] return.

## 2022-07-25 NOTE — Telephone Encounter (Signed)
They also wanted to update Dr. Etter Sjogren that the patient fell yesterday around 4pm and was sent to the hospital. Patient is back with them but she has a big knot on her head. They just wanted to notify PCP.

## 2022-07-25 NOTE — Telephone Encounter (Signed)
This ok to do ?

## 2022-07-25 NOTE — Telephone Encounter (Signed)
Belinda Anderson, with [facility], calling to advise that patient was seen at hospital and they prescribed her Keflex but did not add a stop date. They would like to know if our office can fax an prescription order with a stop date on it to [phone] Attn: Arlisa.

## 2022-07-26 NOTE — Telephone Encounter (Signed)
Hold for the 2 days and restart if no signs of bleeding. If concerned would need to be seen so one of [place] can verify if there is concern for active bleed.

## 2022-07-26 NOTE — Telephone Encounter (Signed)
Patient's daughter stated that the ER doctor advised them to stop her xarelto for two days due to the fall, but her mom is showing a lot of bruising and they are not sure if she needs to start it again. She states [facility] need a confirmation of what they recommend, they can be reached [phone].  Please advise.

## 2022-07-27 LAB — URINE CULTURE: Culture: >=100000 — AB

## 2022-07-28 ENCOUNTER — Telehealth (HOSPITAL_BASED_OUTPATIENT_CLINIC_OR_DEPARTMENT_OTHER): Payer: Self-pay | Admitting: *Deleted

## 2022-07-28 NOTE — Telephone Encounter (Signed)
Post ED Visit - Positive Culture Follow-up  Culture report reviewed by antimicrobial stewardship pharmacist: [place] Team []  Elenor Quinones, Pharm.D. []  Heide Guile, Pharm.D., BCPS AQ-ID []  Parks Neptune, Pharm.D., BCPS []  Alycia Rossetti, Pharm.D., BCPS []  [place], [place].D., BCPS, AAHIVP []  Legrand Como, Pharm.D., BCPS, AAHIVP []  Salome Arnt, PharmD, BCPS []  Johnnette Gourd, PharmD, BCPS []  Hughes Better, PharmD, BCPS []  Leeroy Cha, PharmD []  Laqueta Linden, PharmD, BCPS []  Albertina Parr, PharmD  [place] Team []  Leodis Sias, PharmD []  Lindell Spar, PharmD []  Royetta Asal, PharmD []  Graylin Shiver, Rph []  Rema Fendt) Glennon Mac, PharmD []  Arlyn Dunning, PharmD []  Netta Cedars, PharmD []  Dia Sitter, PharmD []  Leone Haven, PharmD []  Gretta Arab, PharmD []  Theodis Shove, PharmD []  Peggyann Juba, PharmD []  Reuel Boom, PharmD   Positive urine culture Treated with Cephalexin, organism sensitive to the same and no further patient follow-up is required at this time. Louanne Belton., PharmD  Harlon Flor Talley 07/28/2022, 10:30 AM

## 2022-07-28 NOTE — Telephone Encounter (Signed)
Called [facility] and left directions on the directors voicemail.

## 2022-07-31 ENCOUNTER — Other Ambulatory Visit: Payer: Self-pay

## 2022-07-31 ENCOUNTER — Emergency Department (HOSPITAL_COMMUNITY): Payer: Medicare Other

## 2022-07-31 ENCOUNTER — Emergency Department (HOSPITAL_BASED_OUTPATIENT_CLINIC_OR_DEPARTMENT_OTHER): Payer: Medicare Other

## 2022-07-31 ENCOUNTER — Observation Stay (HOSPITAL_COMMUNITY)
Admission: EM | Admit: 2022-07-31 | Discharge: 2022-07-31 | Disposition: A | Discharge status: Home / Self Care | Payer: Medicare Other | Attending: Family Medicine | Admitting: Family Medicine

## 2022-07-31 ENCOUNTER — Encounter (HOSPITAL_COMMUNITY): Payer: Self-pay | Admitting: Pharmacy Technician

## 2022-07-31 ENCOUNTER — Emergency Department (HOSPITAL_BASED_OUTPATIENT_CLINIC_OR_DEPARTMENT_OTHER)
Admission: EM | Admit: 2022-07-31 | Discharge: 2022-07-31 | Disposition: A | Discharge status: Skilled Nursing Facility | Payer: Medicare Other | Source: Home / Self Care | Attending: Emergency Medicine | Admitting: Emergency Medicine

## 2022-07-31 DIAGNOSIS — Z8673 Personal history of transient ischemic attack (TIA), and cerebral infarction without residual deficits: Secondary | ICD-10-CM

## 2022-07-31 DIAGNOSIS — W19XXXA Unspecified fall, initial encounter: Secondary | ICD-10-CM

## 2022-07-31 DIAGNOSIS — N183 Chronic kidney disease, stage 3 unspecified: Secondary | ICD-10-CM

## 2022-07-31 DIAGNOSIS — D62 Acute posthemorrhagic anemia: Secondary | ICD-10-CM

## 2022-07-31 DIAGNOSIS — E872 Acidosis, unspecified: Secondary | ICD-10-CM

## 2022-07-31 DIAGNOSIS — E039 Hypothyroidism, unspecified: Secondary | ICD-10-CM

## 2022-07-31 DIAGNOSIS — I10 Essential (primary) hypertension: Secondary | ICD-10-CM

## 2022-07-31 DIAGNOSIS — S0990XA Unspecified injury of head, initial encounter: Secondary | ICD-10-CM

## 2022-07-31 DIAGNOSIS — R569 Unspecified convulsions: Secondary | ICD-10-CM

## 2022-07-31 DIAGNOSIS — S0101XA Laceration without foreign body of scalp, initial encounter: Secondary | ICD-10-CM

## 2022-07-31 DIAGNOSIS — I4891 Unspecified atrial fibrillation: Secondary | ICD-10-CM | POA: Diagnosis present

## 2022-07-31 DIAGNOSIS — I5032 Chronic diastolic (congestive) heart failure: Secondary | ICD-10-CM

## 2022-07-31 DIAGNOSIS — I700 Atherosclerosis of aorta: Secondary | ICD-10-CM | POA: Diagnosis not present

## 2022-07-31 DIAGNOSIS — R791 Abnormal coagulation profile: Secondary | ICD-10-CM | POA: Insufficient documentation

## 2022-07-31 DIAGNOSIS — F0390 Unspecified dementia without behavioral disturbance: Secondary | ICD-10-CM | POA: Insufficient documentation

## 2022-07-31 DIAGNOSIS — S0001XA Abrasion of scalp, initial encounter: Secondary | ICD-10-CM | POA: Insufficient documentation

## 2022-07-31 DIAGNOSIS — F0280 Dementia in other diseases classified elsewhere without behavioral disturbance: Secondary | ICD-10-CM | POA: Diagnosis not present

## 2022-07-31 DIAGNOSIS — G309 Alzheimer's disease, unspecified: Secondary | ICD-10-CM | POA: Diagnosis not present

## 2022-07-31 DIAGNOSIS — Z515 Encounter for palliative care: Secondary | ICD-10-CM

## 2022-07-31 DIAGNOSIS — D649 Anemia, unspecified: Secondary | ICD-10-CM | POA: Diagnosis not present

## 2022-07-31 DIAGNOSIS — Z66 Do not resuscitate: Secondary | ICD-10-CM | POA: Diagnosis not present

## 2022-07-31 DIAGNOSIS — K7689 Other specified diseases of liver: Secondary | ICD-10-CM | POA: Diagnosis not present

## 2022-07-31 DIAGNOSIS — Z7901 Long term (current) use of anticoagulants: Secondary | ICD-10-CM | POA: Diagnosis not present

## 2022-07-31 DIAGNOSIS — S0003XA Contusion of scalp, initial encounter: Secondary | ICD-10-CM | POA: Diagnosis not present

## 2022-07-31 DIAGNOSIS — R911 Solitary pulmonary nodule: Secondary | ICD-10-CM | POA: Diagnosis not present

## 2022-07-31 HISTORY — DX: Unspecified atrial fibrillation: I48.91

## 2022-07-31 LAB — COMPREHENSIVE METABOLIC PANEL
ALT: 10 U/L (ref 0–44)
AST: 23 U/L (ref 15–41)
Albumin: 3.3 g/dL — ABNORMAL LOW (ref 3.5–5.0)
Alkaline Phosphatase: 91 U/L (ref 38–126)
Anion gap: 11 (ref 5–15)
BUN: 30 mg/dL — ABNORMAL HIGH (ref 8–23)
CO2: 22 mmol/L (ref 22–32)
Calcium: 8.6 mg/dL — ABNORMAL LOW (ref 8.9–10.3)
Chloride: 105 mmol/L (ref 98–111)
Creatinine, Ser: 1.66 mg/dL — ABNORMAL HIGH (ref 0.44–1.00)
GFR, Estimated: 29 mL/min — ABNORMAL LOW (ref >60)
Glucose, Bld: 181 mg/dL — ABNORMAL HIGH (ref 70–99)
Potassium: 3.6 mmol/L (ref 3.5–5.1)
Sodium: 138 mmol/L (ref 135–145)
Total Bilirubin: 1.1 mg/dL (ref 0.3–1.2)
Total Protein: 5.4 g/dL — ABNORMAL LOW (ref 6.5–8.1)

## 2022-07-31 LAB — SAMPLE TO BLOOD BANK

## 2022-07-31 LAB — I-STAT CHEM 8, ED
BUN: 32 mg/dL — ABNORMAL HIGH (ref 8–23)
Calcium, Ion: 1.01 mmol/L — ABNORMAL LOW (ref 1.15–1.40)
Chloride: 105 mmol/L (ref 98–111)
Creatinine, Ser: 1.4 mg/dL — ABNORMAL HIGH (ref 0.44–1.00)
Glucose, Bld: 176 mg/dL — ABNORMAL HIGH (ref 70–99)
HCT: 29 % — ABNORMAL LOW (ref 36.0–46.0)
Hemoglobin: 9.9 g/dL — ABNORMAL LOW (ref 12.0–15.0)
Potassium: 3.7 mmol/L (ref 3.5–5.1)
Sodium: 139 mmol/L (ref 135–145)
TCO2: 22 mmol/L (ref 22–32)

## 2022-07-31 LAB — TYPE AND SCREEN
ABO/RH(D): O POS
Antibody Screen: NEGATIVE

## 2022-07-31 LAB — MAGNESIUM: Magnesium: 2.2 mg/dL (ref 1.7–2.4)

## 2022-07-31 LAB — CBC
HCT: 31.1 % — ABNORMAL LOW (ref 36.0–46.0)
Hemoglobin: 9.8 g/dL — ABNORMAL LOW (ref 12.0–15.0)
MCH: 32 pg (ref 26.0–34.0)
MCHC: 31.5 g/dL (ref 30.0–36.0)
MCV: 101.6 fL — ABNORMAL HIGH (ref 80.0–100.0)
Platelets: 223 10*3/uL (ref 150–400)
RBC: 3.06 MIL/uL — ABNORMAL LOW (ref 3.87–5.11)
RDW: 15 % (ref 11.5–15.5)
WBC: 12 10*3/uL — ABNORMAL HIGH (ref 4.0–10.5)
nRBC: 0 % (ref 0.0–0.2)

## 2022-07-31 LAB — PROTIME-INR
INR: 1.6 — ABNORMAL HIGH (ref 0.8–1.2)
Prothrombin Time: 18.9 seconds — ABNORMAL HIGH (ref 11.4–15.2)

## 2022-07-31 LAB — LACTIC ACID, PLASMA: Lactic Acid, Venous: 5 mmol/L (ref 0.5–1.9)

## 2022-07-31 LAB — TROPONIN I (HIGH SENSITIVITY): Troponin I (High Sensitivity): 10 ng/L (ref <18)

## 2022-07-31 MED ORDER — ONDANSETRON HCL 4 MG/2ML IJ SOLN: 4.0000 mg | Freq: Four times a day (QID) | INTRAMUSCULAR | Status: DC | PRN

## 2022-07-31 MED ORDER — FENTANYL CITRATE (PF) 100 MCG/2ML IJ SOLN
INTRAMUSCULAR | Status: AC
  Filled 2022-07-31: qty 2

## 2022-07-31 MED ORDER — BIOTENE DRY MOUTH MT LIQD: 15.0000 mL | OROMUCOSAL | Status: DC | PRN

## 2022-07-31 MED ORDER — MORPHINE SULFATE (PF) 2 MG/ML IV SOLN
1.0000 mg | INTRAVENOUS | Status: DC
  Administered 2022-07-31: 1 mg via INTRAVENOUS
  Filled 2022-07-31: qty 1

## 2022-07-31 MED ORDER — HALOPERIDOL 1 MG PO TABS
1.0000 mg | ORAL_TABLET | Freq: Once | ORAL | Status: DC
  Filled 2022-07-31: qty 1

## 2022-07-31 MED ORDER — LORAZEPAM 1 MG PO TABS: 1.0000 mg | ORAL_TABLET | ORAL | Status: DC | PRN

## 2022-07-31 MED ORDER — GLYCOPYRROLATE 1 MG PO TABS: 1.0000 mg | ORAL_TABLET | ORAL | Status: DC | PRN

## 2022-07-31 MED ORDER — ONDANSETRON 4 MG PO TBDP: 4.0000 mg | ORAL_TABLET | Freq: Four times a day (QID) | ORAL | Status: DC | PRN

## 2022-07-31 MED ORDER — LORAZEPAM 2 MG/ML IJ SOLN
0.5000 mg | INTRAMUSCULAR | Status: DC
  Administered 2022-07-31 (×2): 0.5 mg via INTRAVENOUS
  Filled 2022-07-31 (×2): qty 1

## 2022-07-31 MED ORDER — MORPHINE SULFATE (PF) 2 MG/ML IV SOLN
1.0000 mg | INTRAVENOUS | Status: DC | PRN
  Administered 2022-07-31: 1 mg via INTRAVENOUS
  Filled 2022-07-31: qty 1

## 2022-07-31 MED ORDER — ACETAMINOPHEN 500 MG PO TABS
1000.0000 mg | ORAL_TABLET | Freq: Once | ORAL | Status: AC
  Administered 2022-07-31: 1000 mg via ORAL
  Filled 2022-07-31: qty 2

## 2022-07-31 MED ORDER — IOHEXOL 350 MG/ML SOLN
80.0000 mL | Freq: Once | INTRAVENOUS | Status: AC | PRN
  Administered 2022-07-31: 80 mL via INTRAVENOUS

## 2022-07-31 MED ORDER — SODIUM CHLORIDE 0.9 % IV BOLUS
500.0000 mL | Freq: Once | INTRAVENOUS | Status: AC
  Administered 2022-07-31: 500 mL via INTRAVENOUS

## 2022-07-31 MED ORDER — ORAL CARE MOUTH RINSE: 15.0000 mL | OROMUCOSAL | Status: DC

## 2022-07-31 MED ORDER — ONDANSETRON HCL 4 MG/2ML IJ SOLN
4.0000 mg | Freq: Once | INTRAMUSCULAR | Status: AC
  Administered 2022-07-31: 4 mg via INTRAVENOUS

## 2022-07-31 MED ORDER — GLYCOPYRROLATE 0.2 MG/ML IJ SOLN: 0.2000 mg | INTRAMUSCULAR | Status: DC | PRN

## 2022-07-31 MED ORDER — ACETAMINOPHEN 325 MG PO TABS: 650.0000 mg | ORAL_TABLET | Freq: Four times a day (QID) | ORAL | Status: DC | PRN

## 2022-07-31 MED ORDER — LORAZEPAM 2 MG/ML PO CONC: 1.0000 mg | ORAL | Status: DC | PRN

## 2022-07-31 MED ORDER — CHLORHEXIDINE GLUCONATE 0.12% ORAL RINSE (MEDLINE KIT): 15.0000 mL | Freq: Two times a day (BID) | OROMUCOSAL | Status: DC

## 2022-07-31 MED ORDER — POLYVINYL ALCOHOL 1.4 % OP SOLN: 1.0000 [drp] | Freq: Four times a day (QID) | OPHTHALMIC | Status: DC | PRN

## 2022-07-31 MED ORDER — FENTANYL CITRATE PF 50 MCG/ML IJ SOSY
12.5000 ug | PREFILLED_SYRINGE | Freq: Once | INTRAMUSCULAR | Status: AC
  Administered 2022-07-31: 12.5 ug via INTRAVENOUS

## 2022-07-31 MED ORDER — LORAZEPAM 2 MG/ML IJ SOLN: 2.0000 mg | INTRAMUSCULAR | Status: DC | PRN

## 2022-07-31 MED ORDER — ACETAMINOPHEN 650 MG RE SUPP: 650.0000 mg | Freq: Four times a day (QID) | RECTAL | Status: DC | PRN

## 2022-07-31 MED ORDER — LORAZEPAM 2 MG/ML IJ SOLN: 1.0000 mg | INTRAMUSCULAR | Status: DC | PRN

## 2022-07-31 NOTE — ED Provider Notes (Signed)
Emergency Department Provider Note   I have reviewed the triage vital signs and the nursing notes.   HISTORY  Chief Complaint Bleeding/Bruising   HPI Belinda Anderson is a 88 y.o. female past history of dementia, A-fib on Xarelto, hypertension presents emergency department with bleeding from her scalp.  She sustained a fall with scalp hematoma and significant bruising to the face on 10/8.  She was seen in the emergency department but ultimately safe for discharge.  Staff noticed blood in the room this evening but no apparent fall.  EMS arrived on scene to find a small area to the left temporal scalp with some new appearing blood but no active bleeding.  They note blood on the floor and the bedside table with staff suspecting she may have scraped of the hematoma along her bedside table causing bleeding but patient cannot provide significant history. She is at her mental status baseline.    Past Medical History:  Diagnosis Date   Alzheimer's dementia ([place])    Arthritis    Atrial fibrillation ([place])    Hypertension    Thyroid disease    TIA (transient ischemic attack)     Review of Systems  Cardiovascular: Denies chest pain. Respiratory: Denies shortness of breath. Gastrointestinal: No abdominal pain. Neurological: Negative for headaches.  ____________________________________________   PHYSICAL EXAM:  VITAL SIGNS: ED Triage Vitals  Enc Vitals Group     BP 07/31/22 0255 (!) 138/109     Pulse Rate 07/31/22 0255 (!) 112     Resp 07/31/22 0255 (!) 22     Temp 07/31/22 0255 98.2 F (36.8 C)     Temp Source 07/31/22 0255 Oral     SpO2 07/31/22 0255 99 %     Weight 07/31/22 0256 136 lb 3.9 oz (61.8 kg)   Constitutional: Alert. Pleasant and talkative.  Eyes: Conjunctivae are normal. PERRL. EOMI. Head: Diffuse bruising to the scalp, face, neck which appears old.  Left temporal hematoma approximately 3 x 3 cm with no active bleeding but dried blood to this area in the hair.   Nose: No congestion/rhinnorhea. Mouth/Throat: Mucous membranes are moist.   Neck: No stridor.  No cervical spine tenderness to palpation. Cardiovascular: Normal rate, regular rhythm. Good peripheral circulation. Grossly normal heart sounds.   Respiratory: Normal respiratory effort.  No retractions. Lungs CTAB. Gastrointestinal: Soft and nontender. No distention.  Musculoskeletal: No gross deformities of extremities. Normal passive ROM of the bilateral upper/lower extremities.  Neurologic:  Normal speech and language.  Skin:  Skin is warm, dry and intact. No rash noted.  ____________________________________________  RADIOLOGY  CT Head Wo Contrast  Result Date: 07/31/2022 CLINICAL DATA:  History of prior fall with persistent hematoma and active bleeding EXAM: CT HEAD WITHOUT CONTRAST TECHNIQUE: Contiguous axial images were obtained from the base of the skull through the vertex without intravenous contrast. RADIATION DOSE REDUCTION: This exam was performed according to the departmental dose-optimization program which includes automated exposure control, adjustment of the mA and/or kV according to patient size and/or use of iterative reconstruction technique. COMPARISON:  07/24/2022 FINDINGS: Brain: No evidence of acute infarction, hemorrhage, hydrocephalus, extra-axial collection or mass lesion/mass effect. Chronic atrophic and ischemic changes are again identified similar to that seen on the prior exam. Vascular: No hyperdense vessel or unexpected calcification. Skull: Normal. Negative for fracture or focal lesion. Sinuses/Orbits: No acute finding. Other: Focal scalp hematoma is noted anteriorly on the left slightly more prominent than that seen on the recent exam. The hematoma measures 2.1  x 1.8 x 2.5 cm. IMPRESSION: Slightly more prominent left frontal hematoma when compared with the prior exam. No acute intracranial abnormality noted. Chronic atrophic and ischemic changes. Electronically Signed    By: Inez Catalina M.D.   On: 07/31/2022 03:31    ____________________________________________   PROCEDURES  Procedure(s) performed:   Procedures  None  ____________________________________________   INITIAL IMPRESSION / ASSESSMENT AND PLAN / ED COURSE  Pertinent labs & imaging results that were available during my care of the patient were reviewed by me and considered in my medical decision making (see chart for details).   This patient is Presenting for Evaluation of head injury, which does require a range of treatment options, and is a complaint that involves a high risk of morbidity and mortality.  The Differential Diagnoses includes subdural hematoma, epidural hematoma, acute concussion, traumatic subarachnoid hemorrhage, cerebral contusions, etc.   I did obtain Additional Historical Information from EMS.  I decided to review pertinent External Data, and in summary patient seen in the ED on 07/24/22 for initial fall with head injury.  Radiologic Tests Ordered, included CT head. I independently interpreted the images and agree with radiology interpretation.    Medical Decision Making: Summary:  Patient presents emergency department from nursing facility with bleeding from the scalp.  Hematoma decreasing in size per EMS/staff at the facility.  There does appear to be an area where the hematoma may have opened up from what appears to be fairly minor trauma but history is somewhat limited due to patient's underlying dementia.  Do plan for repeat head CT scan and wound care here.  No active bleeding requiring suture or other repair at this time.   Reevaluation with update and discussion with patient. CT reassuring. Wound cleaned. Remains hemostatic. No laceration requiring repair. Placed dressing for protection mainly and will leave in place for 24 hours. Will call for return to her facility.   Disposition: discharge  ____________________________________________  FINAL CLINICAL  IMPRESSION(S) / ED DIAGNOSES  Final diagnoses:  Abrasion of scalp, initial encounter    Note:  This document was prepared using Dragon voice recognition software and may include unintentional dictation errors.  Nanda Quinton, MD, [facility]    Jerl Munyan, [facility], MD 07/31/22 [phone]

## 2022-07-31 NOTE — ED Notes (Signed)
Per previous shift, report given to facility and family

## 2022-07-31 NOTE — ED Notes (Signed)
PTAR at bedside 

## 2022-07-31 NOTE — ED Notes (Signed)
Writer called daughter to inform her patient would be returning to facility. Writer attempted to call Jackelyn Poling, RN (palliative care) to update her, no answer.

## 2022-07-31 NOTE — Discharge Instructions (Signed)
You were seen in the emergency department after bleeding from the scalp.  I suspect your hematoma sustained an abrasion and this caused bleeding.  We repeated a CT scan of your head which does not show any new or worsening injury.  I cleaned your scalp wound have placed a fresh dressing on to help keep it protected.  Please keep this in place for 24 hours if you are able, after that you can remove and return to your normal activity.

## 2022-07-31 NOTE — ED Triage Notes (Signed)
Pt coming from [facility]. Fall on 8th. Hematoma on left side of head from fall, EMS reports that patient was found in bed today with hematoma open and bleeding as well as blood on nightstand beside her. Facility staff believes patient cut hematoma on nightstand. No fall today. (-) blood thinners. Bruising across face and on legs/thighs. Pt denies pain. BS 107.

## 2022-07-31 NOTE — ED Provider Notes (Signed)
[facility] EMERGENCY DEPARTMENT Provider Note   CSN: [id number] Arrival date & time: 07/31/22  0940     History  Chief Complaint  Patient presents with   Lytle Michaels    Belinda Anderson is a 88 y.o. female who presents as a level 1 trauma.  History is gathered by review of EMR, EMS at bedside, and patient's daughter.  She has a past medical history of dementia, A-fib on Xarelto.  She is DNR.  Patient has had multiple falls recently.  She was seen on October 8 for an initial fall, head imaging done, was noted to have a large hematoma on the scalp but was ultimately deemed safe for discharge.  Apparently per EMS she was found last night bleeding from a large scalp wound on the left side.  She was found to have soaked her sheets, and there was a large pool of blood around her.  She was evaluated at [facility], had repeat CT scans, was observed with normal vital signs and no evidence of intracranial bleeding and was ultimately discharged.  She was again found on the floor this morning with a large pool of blood around her.  She soaked through her sheets at the facility again per EMS.  Found to be hypotensive, 8 and A-fib with RVR and was reported to have seizure-like activity prior to arrival.  EMS reports that they have noticed some repetitive speech.  She is apparently not at baseline per her memory care staff  The history is provided by the EMS personnel.  Fall       Home Medications Prior to Admission medications   Medication Sig Start Date End Date Taking? Authorizing Provider  Carboxymethylcellulose Sodium (REFRESH LIQUIGEL) 1 % GEL Place 1 drop into both eyes at bedtime.   Yes [provider]  cephALEXin (KEFLEX) 500 MG capsule Take 500 mg by mouth 2 (two) times daily.   Yes [provider]  levothyroxine (SYNTHROID) 75 MCG tablet Take 75 mcg by mouth daily before breakfast. 07/27/22  Yes [provider]  liver oil-zinc oxide  (DESITIN) 40 % ointment Apply 1 Application topically in the morning, at noon, and at bedtime. Redness on sacrum   Yes [provider]  Polyethyl Glyc-Propyl Glyc PF (SYSTANE HYDRATION PF) 0.4-0.3 % SOLN Place 1 drop into both eyes 4 (four) times daily.   Yes [provider]  sertraline (ZOLOFT) 100 MG tablet Take 100 mg by mouth daily. 07/18/22  Yes [provider]      Allergies    Milk-related compounds and Nsaids    Review of Systems   Review of Systems  Physical Exam Updated Vital Signs BP 95/60   Pulse 96   Temp (!) 97.2 F (36.2 C) (Temporal)   Resp 17   Ht 5' 5"  (1.651 m)   Wt 61.7 kg   SpO2 99%   BMI 22.63 kg/m  Physical Exam Vitals reviewed.  Constitutional:      Appearance: She is not ill-appearing.  HENT:     Head: Normocephalic.     Comments: Patient has significant old facial bruising with dependent bruising in the cheeks and neck.  There is a large ulcerated hematoma over the left temple which does not appear to be actively bleeding.  Dried red blood in the hair and face.    Right Ear: Tympanic membrane normal.     Left Ear: Tympanic membrane normal.     Nose: Nose normal.  Eyes:  Extraocular Movements: Extraocular movements intact.     Pupils: Pupils are equal, round, and reactive to light.     Comments: Some conjunctival hemorrhage noted in the right eye  Cardiovascular:     Rate and Rhythm: Tachycardia present. Rhythm regularly irregular.  Pulmonary:     Effort: No tachypnea.     Breath sounds: Normal breath sounds.  Chest:     Chest wall: No lacerations, deformity, tenderness, crepitus or edema.  Abdominal:     General: There is no distension.     Tenderness: There is no abdominal tenderness.     Comments: No obvious signs of trauma to the abdomen, no tenderness.  Genitourinary:    Comments: Normal rectal tone with brown stool present Musculoskeletal:     Cervical back: No tenderness.     Comments: Moves extremities  without ataxia.  No obvious extremity deformities.  Deep bruising noted to the hips left greater than right appears old .reflexes 2+.  Hips are stable to pressure No obvious signs of trauma to the back, no midline spinal tenderness.  Neurological:     Mental Status: She is alert.     ED Results / Procedures / Treatments   Labs (all labs ordered are listed, but only abnormal results are displayed) Labs Reviewed  COMPREHENSIVE METABOLIC PANEL - Abnormal; Notable for the following components:      Result Value   Glucose, Bld 181 (*)    BUN 30 (*)    Creatinine, Ser 1.66 (*)    Calcium 8.6 (*)    Total Protein 5.4 (*)    Albumin 3.3 (*)    GFR, Estimated 29 (*)    All other components within normal limits  CBC - Abnormal; Notable for the following components:   WBC 12.0 (*)    RBC 3.06 (*)    Hemoglobin 9.8 (*)    HCT 31.1 (*)    MCV 101.6 (*)    All other components within normal limits  LACTIC ACID, PLASMA - Abnormal; Notable for the following components:   Lactic Acid, Venous 5.0 (*)    All other components within normal limits  PROTIME-INR - Abnormal; Notable for the following components:   Prothrombin Time 18.9 (*)    INR 1.6 (*)    All other components within normal limits  I-STAT CHEM 8, ED - Abnormal; Notable for the following components:   BUN 32 (*)    Creatinine, Ser 1.40 (*)    Glucose, Bld 176 (*)    Calcium, Ion 1.01 (*)    Hemoglobin 9.9 (*)    HCT 29.0 (*)    All other components within normal limits  MAGNESIUM  SAMPLE TO BLOOD BANK  TYPE AND SCREEN  ABO/RH  TROPONIN I (HIGH SENSITIVITY)    EKG None  Radiology CT CHEST ABDOMEN PELVIS W CONTRAST  Result Date: 07/31/2022 CLINICAL DATA:  Blunt poly trauma. EXAM: CT CHEST, ABDOMEN, AND PELVIS WITH CONTRAST TECHNIQUE: Multidetector CT imaging of the chest, abdomen and pelvis was performed following the standard protocol during bolus administration of intravenous contrast. RADIATION DOSE REDUCTION: This  exam was performed according to the departmental dose-optimization program which includes automated exposure control, adjustment of the mA and/or kV according to patient size and/or use of iterative reconstruction technique. CONTRAST:  47m OMNIPAQUE IOHEXOL 350 MG/ML SOLN COMPARISON:  CTA chest abdomen pelvis 03/04/2018. FINDINGS: CT CHEST FINDINGS Cardiovascular: The heart size is normal. No substantial pericardial effusion. Mild atherosclerotic calcification is noted in the wall of  the thoracic aorta. Mediastinum/Nodes: No mediastinal lymphadenopathy. There is no hilar lymphadenopathy. The esophagus has normal imaging features. There is no axillary lymphadenopathy. Lungs/Pleura: No evidence for pneumothorax or pleural effusion. No focal airspace consolidation. No pleural effusion 4 mm medial left lower lobe nodule on image 116/5 is new in the interval. Musculoskeletal: No worrisome lytic or sclerotic osseous abnormality. CT ABDOMEN PELVIS FINDINGS Hepatobiliary: Nodular hepatic contour suggests cirrhosis. No suspicious focal abnormality within the liver parenchyma. Gallbladder surgically absent. Common bile duct dilated up to 8 mm diameter, upper normal for patient age in similar to the 2019 exam. Pancreas: No focal mass lesion. No dilatation of the main duct. No intraparenchymal cyst. No peripancreatic edema. Spleen: No splenomegaly. No focal mass lesion. Adrenals/Urinary Tract: No adrenal nodule or mass. Cortical scarring noted both kidneys without hydronephrosis. No evidence for hydroureter. The urinary bladder appears normal for the degree of distention. Stomach/Bowel: Stomach is unremarkable. No gastric wall thickening. No evidence of outlet obstruction. Duodenum is normally positioned as is the ligament of Treitz. No small bowel wall thickening. No small bowel dilatation. The terminal ileum is normal. The appendix is normal. No gross colonic mass. No colonic wall thickening. Diverticular changes are noted  in the left colon without evidence of diverticulitis. Vascular/Lymphatic: There is mild atherosclerotic calcification of the abdominal aorta without aneurysm. There is no gastrohepatic or hepatoduodenal ligament lymphadenopathy. No retroperitoneal or mesenteric lymphadenopathy. No pelvic sidewall lymphadenopathy. Reproductive: The uterus is unremarkable.  There is no adnexal mass. Other: No intraperitoneal free fluid. Musculoskeletal: No worrisome lytic or sclerotic osseous abnormality. No evidence for lumbar spine or bony pelvis fracture. No femoral neck fracture evident. Incompletely visualized subcutaneous hematoma identified superficial to the left greater trochanter measuring at least 4.6 x 2.9 x 5.9 cm with surrounding edema/hemorrhage. IMPRESSION: 1. Incompletely visualized subcutaneous hematoma superficial to the left greater trochanter measuring at least 4.6 x 2.9 x 5.9 cm with surrounding edema/hemorrhage. 2. No other acute traumatic injury identified in the chest, abdomen, or pelvis. 3. 4 mm medial left lower lobe pulmonary nodule is new in the interval. No follow-up needed if patient is low-risk.This recommendation follows the consensus statement: Guidelines for Management of Incidental Pulmonary Nodules Detected on CT Images: From the Fleischner Society 2017; Radiology 2017; 284:228-243. 4. Nodular hepatic contour suggests cirrhosis. 5. Left colonic diverticulosis without diverticulitis. 6.  Aortic Atherosclerosis (ICD10-I70.0). Subcutaneous hematoma in the left hip with absence of other acute findings discussed with Dr. Lovena Neighbours at approximately 1039 hours on 07/31/2022. Electronically Signed   By: Misty Stanley M.D.   On: 07/31/2022 10:54   CT CERVICAL SPINE WO CONTRAST  Result Date: 07/31/2022 CLINICAL DATA:  Moderate to severe head trauma. EXAM: CT HEAD WITHOUT CONTRAST CT CERVICAL SPINE WITHOUT CONTRAST TECHNIQUE: Multidetector CT imaging of the head and cervical spine was performed following  the standard protocol without intravenous contrast. Multiplanar CT image reconstructions of the cervical spine were also generated. RADIATION DOSE REDUCTION: This exam was performed according to the departmental dose-optimization program which includes automated exposure control, adjustment of the mA and/or kV according to patient size and/or use of iterative reconstruction technique. COMPARISON:  Head CT earlier the same day at 0323 hours. Head and cervical spine CT from 07/24/2022. FINDINGS: CT HEAD FINDINGS Brain: There is no evidence for acute hemorrhage, hydrocephalus, mass lesion, or abnormal extra-axial fluid collection. No definite CT evidence for acute infarction. Diffuse loss of parenchymal volume is consistent with atrophy. Patchy low attenuation in the deep hemispheric and periventricular white matter  is nonspecific, but likely reflects chronic microvascular ischemic demyelination. Vascular: No hyperdense vessel or unexpected calcification. Skull: No evidence for fracture. No worrisome lytic or sclerotic lesion. Sinuses/Orbits: The visualized paranasal sinuses and mastoid air cells are clear. Visualized portions of the globes and intraorbital fat are unremarkable. Other: Left frontal scalp hematoma again noted, not substantially changed. CT CERVICAL SPINE FINDINGS Alignment: Straightening of normal cervical lordosis without evidence for traumatic subluxation. Skull base and vertebrae: No acute fracture. No primary bone lesion or focal pathologic process. Soft tissues and spinal canal: No prevertebral fluid or swelling. No visible canal hematoma. Disc levels: Diffuse loss of disc height noted from C3-4 down to C5-6. Facets are well aligned bilaterally. Upper chest: See dedicated chest CT performed at the same time. Other: None. IMPRESSION: 1. No acute intracranial abnormality. Similar appearance left frontal scalp hematoma. 2. Atrophy with chronic small vessel ischemic disease. 3. No evidence for cervical  spine fracture or traumatic subluxation. 4. Degenerative changes in the cervical spine as above. Electronically Signed   By: Misty Stanley M.D.   On: 07/31/2022 10:31   CT HEAD WO CONTRAST  Result Date: 07/31/2022 CLINICAL DATA:  Moderate to severe head trauma. EXAM: CT HEAD WITHOUT CONTRAST CT CERVICAL SPINE WITHOUT CONTRAST TECHNIQUE: Multidetector CT imaging of the head and cervical spine was performed following the standard protocol without intravenous contrast. Multiplanar CT image reconstructions of the cervical spine were also generated. RADIATION DOSE REDUCTION: This exam was performed according to the departmental dose-optimization program which includes automated exposure control, adjustment of the mA and/or kV according to patient size and/or use of iterative reconstruction technique. COMPARISON:  Head CT earlier the same day at 0323 hours. Head and cervical spine CT from 07/24/2022. FINDINGS: CT HEAD FINDINGS Brain: There is no evidence for acute hemorrhage, hydrocephalus, mass lesion, or abnormal extra-axial fluid collection. No definite CT evidence for acute infarction. Diffuse loss of parenchymal volume is consistent with atrophy. Patchy low attenuation in the deep hemispheric and periventricular white matter is nonspecific, but likely reflects chronic microvascular ischemic demyelination. Vascular: No hyperdense vessel or unexpected calcification. Skull: No evidence for fracture. No worrisome lytic or sclerotic lesion. Sinuses/Orbits: The visualized paranasal sinuses and mastoid air cells are clear. Visualized portions of the globes and intraorbital fat are unremarkable. Other: Left frontal scalp hematoma again noted, not substantially changed. CT CERVICAL SPINE FINDINGS Alignment: Straightening of normal cervical lordosis without evidence for traumatic subluxation. Skull base and vertebrae: No acute fracture. No primary bone lesion or focal pathologic process. Soft tissues and spinal canal: No  prevertebral fluid or swelling. No visible canal hematoma. Disc levels: Diffuse loss of disc height noted from C3-4 down to C5-6. Facets are well aligned bilaterally. Upper chest: See dedicated chest CT performed at the same time. Other: None. IMPRESSION: 1. No acute intracranial abnormality. Similar appearance left frontal scalp hematoma. 2. Atrophy with chronic small vessel ischemic disease. 3. No evidence for cervical spine fracture or traumatic subluxation. 4. Degenerative changes in the cervical spine as above. Electronically Signed   By: Misty Stanley M.D.   On: 07/31/2022 10:31   DG Pelvis Portable  Result Date: 07/31/2022 CLINICAL DATA:  Multiple falls EXAM: PORTABLE PELVIS 1-2 VIEWS COMPARISON:  Sacrum done on 05/02/2022 FINDINGS: No displaced fracture or dislocation is seen. Linear smooth marginated calcification adjacent to the greater trochanter of right femur may suggest ligament calcification from previous injury. Degenerative changes are noted in the visualized lower lumbar spine. Small sclerotic density in left ischium may  suggest benign bone island. IMPRESSION: No fracture or dislocation is seen.  Lumbar spondylosis. Electronically Signed   By: Elmer Picker M.D.   On: 07/31/2022 10:14   DG Chest Port 1 View  Result Date: 07/31/2022 CLINICAL DATA:  Trauma.  Falls. EXAM: PORTABLE CHEST 1 VIEW COMPARISON:  02/04/2021 FINDINGS: 0954 hours. The lungs are clear without focal pneumonia, edema, pneumothorax or pleural effusion. The cardiopericardial silhouette is within normal limits for size. The visualized bony structures of the thorax are unremarkable. Telemetry leads overlie the chest. IMPRESSION: No active disease. Electronically Signed   By: Misty Stanley M.D.   On: 07/31/2022 10:13    Procedures .Critical Care  Performed by: Margarita Mail, PA-C Authorized by: Margarita Mail, PA-C   Critical care provider statement:    Critical care time (minutes):  75   Critical care time  was exclusive of:  Separately billable procedures and treating other patients   Critical care was necessary to treat or prevent imminent or life-threatening deterioration of the following conditions:  Trauma   Critical care was time spent personally by me on the following activities:  Development of treatment plan with patient or surrogate, discussions with consultants, evaluation of patient's response to treatment, examination of patient, ordering and review of laboratory studies, ordering and review of radiographic studies, ordering and performing treatments and interventions, pulse oximetry, re-evaluation of patient's condition and review of old charts .Marland KitchenLaceration Repair  Date/Time: 07/31/2022 12:21 PM  Performed by: Margarita Mail, PA-C Authorized by: Margarita Mail, PA-C   Consent:    Consent obtained:  Emergent situation Universal protocol:    Procedure explained and questions answered to patient or proxy's satisfaction: yes     Relevant documents present and verified: yes     Test results available: yes     Imaging studies available: yes     Required blood products, implants, devices, and special equipment available: yes     Site/side marked: yes     Immediately prior to procedure, a time out was called: yes     Patient identity confirmed:  Verbally with patient Anesthesia:    Anesthesia method:  Local infiltration   Local anesthetic:  Lidocaine 2% WITH epi Laceration details:    Location:  Face   Face location:  Forehead   Length (cm):  4 Pre-procedure details:    Preparation:  Patient was prepped and draped in usual sterile fashion Exploration:    Hemostasis achieved with:  Tied off vessels   Wound exploration: wound explored through full range of motion and entire depth of wound visualized     Wound extent: vascular damage   Treatment:    Area cleansed with:  Povidone-iodine   Irrigation method:  Syringe Skin repair:    Repair method:  Sutures   Suture size:  3-0    Suture material:  Prolene   Suture technique:  Figure eight and horizontal mattress Approximation:    Approximation:  Close Repair type:    Repair type:  Simple Post-procedure details:    Dressing:  Open (no dressing)   Procedure completion:  Tolerated well, no immediate complications     Medications Ordered in ED Medications  fentaNYL (SUBLIMAZE) 100 MCG/2ML injection (  Not Given 07/31/22 1605)  acetaminophen (TYLENOL) tablet 650 mg (has no administration in time range)    Or  acetaminophen (TYLENOL) suppository 650 mg (has no administration in time range)  ondansetron (ZOFRAN-ODT) disintegrating tablet 4 mg (has no administration in time range)    Or  ondansetron (ZOFRAN) injection 4 mg (has no administration in time range)  glycopyrrolate (ROBINUL) tablet 1 mg (has no administration in time range)    Or  glycopyrrolate (ROBINUL) injection 0.2 mg (has no administration in time range)    Or  glycopyrrolate (ROBINUL) injection 0.2 mg (has no administration in time range)  antiseptic oral rinse (BIOTENE) solution 15 mL (has no administration in time range)  polyvinyl alcohol (LIQUIFILM TEARS) 1.4 % ophthalmic solution 1 drop (has no administration in time range)  morphine (PF) 2 MG/ML injection 1 mg (1 mg Intravenous Not Given 07/31/22 1606)  morphine (PF) 2 MG/ML injection 1 mg (has no administration in time range)  LORazepam (ATIVAN) tablet 1 mg (has no administration in time range)    Or  LORazepam (ATIVAN) 2 MG/ML concentrated solution 1 mg (has no administration in time range)    Or  LORazepam (ATIVAN) injection 1 mg (has no administration in time range)  LORazepam (ATIVAN) injection 0.5 mg (0.5 mg Intravenous Given 07/31/22 1611)  chlorhexidine gluconate (MEDLINE KIT) (PERIDEX) 0.12 % solution 15 mL (15 mLs Mouth Rinse Not Given 07/31/22 1350)  Oral care mouth rinse (15 mLs Mouth Rinse Not Given 07/31/22 1350)  LORazepam (ATIVAN) injection 2 mg (has no administration in  time range)  fentaNYL (SUBLIMAZE) injection 12.5 mcg (12.5 mcg Intravenous Given 07/31/22 0946)  ondansetron (ZOFRAN) injection 4 mg (4 mg Intravenous Given 07/31/22 0946)  iohexol (OMNIPAQUE) 350 MG/ML injection 80 mL (80 mLs Intravenous Contrast Given 07/31/22 1039)  sodium chloride 0.9 % bolus 500 mL (500 mLs Intravenous New Bag/Given 07/31/22 1109)    ED Course/ Medical Decision Making/ A&P Clinical Course as of 07/31/22 1628  Sun Jul 31, 2022  1041 I-Stat Chem 8, ED(!) [AH]  1041 Protime-INR(!) [AH]  1041 CBC(!) Patient's hemoglobin noted to be 9.8.  Review of nonemergent medical record shows last hemoglobin greater than 14 on 07/24/2022 [AH]  1041 CT CERVICAL SPINE WO CONTRAST [AH]  1041 CT HEAD WO CONTRAST Pressures soft, bolus orderd 500 ml I visualized and interpreted CT head and C-spine both of which show no acute findings [AH]  1210 Lactic Acid, Venous(!!): 5.0 [AH]  1213 DG Pelvis Portable [AH]  1213 DG Chest Port 1 View I visualized and interpreted the portable chest and pelvis at bedside in accordance with ATLS protocol [AH]  1213 CT CHEST ABDOMEN PELVIS W CONTRAST CT chest abdomen and pelvis shows a subcutaneous hematoma of the left hip as noted on physical examination.  No other significant findings noted. [AH]  2836 Patient's family at bedside.  Her daughter and healthcare decision-maker is present.  She has agreed to leave the IV in place but does not wish to proceed with any other interventions including administration of blood products.  She does not feel that any further interventions would be her mother's wishes.  She has agreed to admit for palliative care and comfort measures. [AH]  1215 Case discussed via telephone call with palliative care. Burt Knack NP) who will consult on the patient. [AH]    Clinical Course User Index [AH] Margarita Mail, PA-C                           Medical Decision Making Patient here as trauma. The emergent differential diagnosis for  trauma is extensive and requires complex medical decision making. The differential includes, but is not limited to traumatic brain injury, Orbital trauma, maxillofacial trauma, skull fracture, blunt/penetrating neck trauma, vertebral artery  dissection, whiplash, cervical fracture, neurogenic shock, spinal cord injury, thoracic trauma (blunt/penetrating) cardiac trauma, thoracic and lumbar spine trauma. Abdominal trauma (blunt. Penetrating), genitourinary trauma, extremity fractures, skin lacerations/ abrasions, vascular injuries.  After review of all data points patient does not appear to have any intracranial hemorrhage.  She does appear to have a significant drop in her hemoglobin.  This is evidenced by her lactic acid level being elevated.  Her bleeding stabilized with laceration repair of an open wound on her head.  She has multiple contusions, increasing weakness and confusion.  After review of all imaging which I personally interpreted there no acute intracranial cervical thoracic or abdominal abnormalities.  As per the ED course discussion with patient's daughter at bedside and after discussion with palliative care she will be made comfort care.  Discussed with Dr. Eliberto Ivory who will admit the patient.  Amount and/or Complexity of Data Reviewed Independent Historian:     Details: Daughter at bedside Labs: ordered. Decision-making details documented in ED Course. Radiology: ordered and independent interpretation performed. Decision-making details documented in ED Course.    Details: Visualized and interpreted portable chest and pelvis x-ray, CT head, CT C-spine, chest abdomen and pelvis CT. ECG/medicine tests: ordered and independent interpretation performed.  Risk Prescription drug management. Decision regarding hospitalization. Decision not to resuscitate or to de-escalate care because of poor prognosis.      Final Clinical Impression(s) / ED Diagnoses Final diagnoses:  Anemia due to  acute blood loss  Traumatic injury of head, initial encounter  Fall, initial encounter    Rx / DC Orders ED Discharge Orders          Ordered    Discharge wound care:       Comments: Will need sutures removed from forehead in 7-10 days   07/31/22 1439              Margarita Mail, PA-C 07/31/22 1628    Pattricia Boss, MD 08/01/22 [phone]

## 2022-07-31 NOTE — Assessment & Plan Note (Signed)
Likely secondary to possible seizure like activity, afib with RVR and trauma No s/sx of infectious source Received 500cc bolus  Family has opted for full comfort care at this time

## 2022-07-31 NOTE — Discharge Summary (Signed)
Physician Discharge Summary   Patient: Belinda Anderson MRN: [id number] DOB: 1932-07-06  Admit date:     07/31/2022  Discharge date: 07/31/22  Discharge Physician: Orland Mustard   PCP: [name], DO   Recommendations at discharge:    Patient being discharged to [facility] for inpatient hospice care.   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 *  Hospital Course: Belinda Anderson is a 88 y.o. female with medical history significant of atrial fibrillation on xarelto, dementia, hypothyroidism, diastolic CHF, hx of CVA, multiple falls who presented to ED after unwitnessed fall today and seizure like activity.  She had a fall on the 8th and was seen at [facility] and was found to have a large forehead hematoma. Head CT was okay and she was stable to return home. Xarelto was held at that time and has not been continued. She was found lats night bleeding from her scalp hematoma and was evaluated again at [facility]. Repeat head CT scans okay and she was observed and ultimately discharged.  Family states she hadn't been home an hour and fell again.Marland Kitchen She was found again today on the floor in a puddle of blood and witnessed with seizure like activity. She came in with afib with RVR and hypotensive. She can provide no history but family is present. They elected to do full comfort care and palliative care was consulted.   Palliative care consulted and have arranged  for her to be discharged today to [facility] for inpatient hospice care.   Assessment and Plan: * Atrial fibrillation with RVR (HCC) 88 year old female presenting to ED after unwitnessed fall with bleeding from hematoma, seizure like activity and found to be in afib with RVR -obs to palliative floor -family has opted for full comfort care at this time -in setting of acute anemia from bleeding, afib with rvr she is hypotensive. Palliative care is working on getting her placed in inpatient hospice care -rate now 90-100 with 500cc IVF bolus only, family does not want any other intervention done  -comfort care order set utilized -vitals per comfort care -being discharged to [facility] place inpatient hospice care   Acute blood loss anemia hgb baseline is normal, now down to 9.8 s/p fall with bleeding head laceration and hematoma over left greater trochanter with surrounding edema/hemorrhage.  xarelto held since 10/8 Family has opted for full comfort care   Fall with forehead hematoma/subcutaneous hematoma superficial to the left greater trochanter  Trauma surgery consulted, but family has opted for full comfort care No acute fractures  Seizure-like activity (HCC) Seizure precautions Ativan PRN Discharging to [facility] for inpatient hospice   Scalp laceration S/p repair by EDP, hemodynamically stable Head CT with no acute finding  Will need sutures removed in 7-10 days in applicable in this setting   Lactic acidosis Likely secondary to possible seizure like activity, afib with RVR and trauma No s/sx of infectious source Received 500cc bolus  Family has opted for full comfort care at this time   Essential hypertension Hypotensive in setting of acute blood loss anemia and afib with RVR Hold anti-hypertensive, on full comfort care   CKD (chronic kidney disease) stage 3, GFR 30-59 ml/min (HCC) baseline 1.4-1.6 Stable   Hypothyroidism TSH wnl in 04/2022 Continue synthroid    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 Diastolic indeterminate. Bilateral atriums severely enlarged  Full comfort care only     Pain control - [place] Controlled Substance Reporting System database was reviewed. and patient was instructed, not to drive, operate heavy machinery, perform activities at heights, swimming or participation in water activities or provide baby-sitting services while on Pain, Sleep and Anxiety Medications; until their outpatient Physician has advised to do so again. Also recommended to not to take more than prescribed Pain, Sleep and Anxiety Medications.  Consultants: palliative care/trauma surgery  Procedures performed: simple laceration repair by EDP with prolene sutures on forehead hematoma   Disposition: Hospice care Diet recommendation:  NPO per hospice MD  DISCHARGE MEDICATION: Allergies as of 07/31/2022       Reactions   Milk-related Compounds Other (See Comments)   Unknown reaction    Nsaids Other (See Comments)   Unknown reaction        Medication List     STOP taking these medications    acetaminophen 650 MG CR tablet Commonly known as: TYLENOL   diltiazem 60 MG tablet Commonly known as: CARDIZEM   ENSURE PO   furosemide 20 MG tablet Commonly known as: LASIX   furosemide 40 MG tablet Commonly known as: LASIX   Glycerin 0.25 % Soln   haloperidol 1 MG tablet Commonly known as: HALDOL   potassium chloride SA 20 MEQ tablet Commonly known as: KLOR-CON M   spironolactone 25 MG tablet Commonly known as: ALDACTONE   Vitamin D3 25 MCG (1000 UT) Caps   Xarelto 15 MG Tabs tablet Generic drug: Rivaroxaban       TAKE these medications    Keflex 500 MG capsule Generic drug: cephALEXin Take 500 mg by mouth 2 (two) times daily.   levothyroxine 75 MCG  tablet Commonly known as: SYNTHROID Take 75 mcg by mouth daily before breakfast.   liver oil-zinc oxide 40 % ointment Commonly known as: DESITIN Apply 1 Application topically in the morning, at noon, and at bedtime. Redness on sacrum   Refresh Liquigel 1 % Gel Generic drug: Carboxymethylcellulose Sodium Place 1 drop into both eyes at bedtime.   sertraline 100 MG tablet Commonly known as: ZOLOFT Take 100 mg by mouth daily.   Systane Hydration PF 0.4-0.3 % Soln Generic drug: Polyethyl Glyc-Propyl Glyc PF Place 1 drop into both eyes 4 (four) times daily.               Discharge Care Instructions  (From admission, onward)           Start     Ordered   07/31/22 0000  Discharge wound care:       Comments: Will need sutures removed from forehead in 7-10 days   07/31/22 1439            Discharge Exam: Filed Weights   07/31/22 1147  Weight: 61.7 kg  General:  Appears calm and comfortable and is in NAD. Hematoma that is  stitched on left forehead, dried blood and multiple ecchymosis over head.  Eyes:  PERRL, EOMI, normal lids, iris ENT:   lips & tongue, mmm; appropriate dentition Neck:  no LAD, masses or thyromegaly; no carotid bruits Cardiovascular:  irregularly irregular, no m/r/g. No LE edema.  Respiratory:   CTA bilaterally with no wheezes/rales/rhonchi.  Normal respiratory effort. Abdomen:  soft, NT, ND, NABS Back:   normal alignment, no CVAT Skin:  dried blood over body/feet/head. Large ecchymosis and  hematoma of left hip. Sutured hematoma of left forehead Musculoskeletal:  appears to have normal tone.  Lower extremity:  No LE edema.  Limited foot exam with no ulcerations.  2+ distal pulses. Psychiatric:  can not test  Neurologic:  moves extremities spontaneously. Does not open eyes or interact. Tone appears wnl.   Condition at discharge: poor  The results of significant diagnostics from this hospitalization (including imaging, microbiology, ancillary and  laboratory) are listed below for reference.   Imaging Studies: CT CHEST ABDOMEN PELVIS W CONTRAST  Result Date: 07/31/2022 CLINICAL DATA:  Blunt poly trauma. EXAM: CT CHEST, ABDOMEN, AND PELVIS WITH CONTRAST TECHNIQUE: Multidetector CT imaging of the chest, abdomen and pelvis was performed following the standard protocol during bolus administration of intravenous contrast. RADIATION DOSE REDUCTION: This exam was performed according to the departmental dose-optimization program which includes automated exposure control, adjustment of the mA and/or kV according to patient size and/or use of iterative reconstruction technique. CONTRAST:  80mL OMNIPAQUE IOHEXOL 350 MG/ML SOLN COMPARISON:  CTA chest abdomen pelvis 03/04/2018. FINDINGS: CT CHEST FINDINGS Cardiovascular: The heart size is normal. No substantial pericardial effusion. Mild atherosclerotic calcification is noted in the wall of the thoracic aorta. Mediastinum/Nodes: No mediastinal lymphadenopathy. There is no hilar lymphadenopathy. The esophagus has normal imaging features. There is no axillary lymphadenopathy. Lungs/Pleura: No evidence for pneumothorax or pleural effusion. No focal airspace consolidation. No pleural effusion 4 mm medial left lower lobe nodule on image 116/5 is new in the interval. Musculoskeletal: No worrisome lytic or sclerotic osseous abnormality. CT ABDOMEN PELVIS FINDINGS Hepatobiliary: Nodular hepatic contour suggests cirrhosis. No suspicious focal abnormality within the liver parenchyma. Gallbladder surgically absent. Common bile duct dilated up to 8 mm diameter, upper normal for patient age in similar to the 2019 exam. Pancreas: No focal mass lesion. No dilatation of the main duct. No intraparenchymal cyst. No peripancreatic edema. Spleen: No splenomegaly. No focal mass lesion. Adrenals/Urinary Tract: No adrenal nodule or mass. Cortical scarring noted both kidneys without hydronephrosis. No evidence for hydroureter. The urinary  bladder appears normal for the degree of distention. Stomach/Bowel: Stomach is unremarkable. No gastric wall thickening. No evidence of outlet obstruction. Duodenum is normally positioned as is the ligament of Treitz. No small bowel wall thickening. No small bowel dilatation. The terminal ileum is normal. The appendix is normal. No gross colonic mass. No colonic wall thickening. Diverticular changes are noted in the left colon without evidence of diverticulitis. Vascular/Lymphatic: There is mild atherosclerotic calcification of the abdominal aorta without aneurysm. There is no gastrohepatic or hepatoduodenal ligament lymphadenopathy. No retroperitoneal or mesenteric lymphadenopathy. No pelvic sidewall lymphadenopathy. Reproductive: The uterus is unremarkable.  There is no adnexal mass. Other: No intraperitoneal free fluid. Musculoskeletal: No worrisome lytic or sclerotic osseous abnormality. No evidence for lumbar spine or bony pelvis fracture. No femoral neck fracture evident. Incompletely visualized subcutaneous hematoma identified superficial to the left greater trochanter measuring at least 4.6 x 2.9 x 5.9 cm with surrounding edema/hemorrhage. IMPRESSION: 1. Incompletely visualized subcutaneous hematoma superficial to  the left greater trochanter measuring at least 4.6 x 2.9 x 5.9 cm with surrounding edema/hemorrhage. 2. No other acute traumatic injury identified in the chest, abdomen, or pelvis. 3. 4 mm medial left lower lobe pulmonary nodule is new in the interval. No follow-up needed if patient is low-risk.This recommendation follows the consensus statement: Guidelines for Management of Incidental Pulmonary Nodules Detected on CT Images: From the Fleischner Society 2017; Radiology 2017; 284:228-243. 4. Nodular hepatic contour suggests cirrhosis. 5. Left colonic diverticulosis without diverticulitis. 6.  Aortic Atherosclerosis (ICD10-I70.0). Subcutaneous hematoma in the left hip with absence of other acute  findings discussed with Dr. Liliane Shi at approximately 1039 hours on 07/31/2022. Electronically Signed   By: Kennith Center M.D.   On: 07/31/2022 10:54   CT CERVICAL SPINE WO CONTRAST  Result Date: 07/31/2022 CLINICAL DATA:  Moderate to severe head trauma. EXAM: CT HEAD WITHOUT CONTRAST CT CERVICAL SPINE WITHOUT CONTRAST TECHNIQUE: Multidetector CT imaging of the head and cervical spine was performed following the standard protocol without intravenous contrast. Multiplanar CT image reconstructions of the cervical spine were also generated. RADIATION DOSE REDUCTION: This exam was performed according to the departmental dose-optimization program which includes automated exposure control, adjustment of the mA and/or kV according to patient size and/or use of iterative reconstruction technique. COMPARISON:  Head CT earlier the same day at 0323 hours. Head and cervical spine CT from 07/24/2022. FINDINGS: CT HEAD FINDINGS Brain: There is no evidence for acute hemorrhage, hydrocephalus, mass lesion, or abnormal extra-axial fluid collection. No definite CT evidence for acute infarction. Diffuse loss of parenchymal volume is consistent with atrophy. Patchy low attenuation in the deep hemispheric and periventricular white matter is nonspecific, but likely reflects chronic microvascular ischemic demyelination. Vascular: No hyperdense vessel or unexpected calcification. Skull: No evidence for fracture. No worrisome lytic or sclerotic lesion. Sinuses/Orbits: The visualized paranasal sinuses and mastoid air cells are clear. Visualized portions of the globes and intraorbital fat are unremarkable. Other: Left frontal scalp hematoma again noted, not substantially changed. CT CERVICAL SPINE FINDINGS Alignment: Straightening of normal cervical lordosis without evidence for traumatic subluxation. Skull base and vertebrae: No acute fracture. No primary bone lesion or focal pathologic process. Soft tissues and spinal canal: No  prevertebral fluid or swelling. No visible canal hematoma. Disc levels: Diffuse loss of disc height noted from C3-4 down to C5-6. Facets are well aligned bilaterally. Upper chest: See dedicated chest CT performed at the same time. Other: None. IMPRESSION: 1. No acute intracranial abnormality. Similar appearance left frontal scalp hematoma. 2. Atrophy with chronic small vessel ischemic disease. 3. No evidence for cervical spine fracture or traumatic subluxation. 4. Degenerative changes in the cervical spine as above. Electronically Signed   By: Kennith Center M.D.   On: 07/31/2022 10:31   CT HEAD WO CONTRAST  Result Date: 07/31/2022 CLINICAL DATA:  Moderate to severe head trauma. EXAM: CT HEAD WITHOUT CONTRAST CT CERVICAL SPINE WITHOUT CONTRAST TECHNIQUE: Multidetector CT imaging of the head and cervical spine was performed following the standard protocol without intravenous contrast. Multiplanar CT image reconstructions of the cervical spine were also generated. RADIATION DOSE REDUCTION: This exam was performed according to the departmental dose-optimization program which includes automated exposure control, adjustment of the mA and/or kV according to patient size and/or use of iterative reconstruction technique. COMPARISON:  Head CT earlier the same day at 0323 hours. Head and cervical spine CT from 07/24/2022. FINDINGS: CT HEAD FINDINGS Brain: There is no evidence for acute hemorrhage, hydrocephalus, mass lesion, or abnormal  extra-axial fluid collection. No definite CT evidence for acute infarction. Diffuse loss of parenchymal volume is consistent with atrophy. Patchy low attenuation in the deep hemispheric and periventricular white matter is nonspecific, but likely reflects chronic microvascular ischemic demyelination. Vascular: No hyperdense vessel or unexpected calcification. Skull: No evidence for fracture. No worrisome lytic or sclerotic lesion. Sinuses/Orbits: The visualized paranasal sinuses and mastoid  air cells are clear. Visualized portions of the globes and intraorbital fat are unremarkable. Other: Left frontal scalp hematoma again noted, not substantially changed. CT CERVICAL SPINE FINDINGS Alignment: Straightening of normal cervical lordosis without evidence for traumatic subluxation. Skull base and vertebrae: No acute fracture. No primary bone lesion or focal pathologic process. Soft tissues and spinal canal: No prevertebral fluid or swelling. No visible canal hematoma. Disc levels: Diffuse loss of disc height noted from C3-4 down to C5-6. Facets are well aligned bilaterally. Upper chest: See dedicated chest CT performed at the same time. Other: None. IMPRESSION: 1. No acute intracranial abnormality. Similar appearance left frontal scalp hematoma. 2. Atrophy with chronic small vessel ischemic disease. 3. No evidence for cervical spine fracture or traumatic subluxation. 4. Degenerative changes in the cervical spine as above. Electronically Signed   By: Kennith Center M.D.   On: 07/31/2022 10:31   DG Pelvis Portable  Result Date: 07/31/2022 CLINICAL DATA:  Multiple falls EXAM: PORTABLE PELVIS 1-2 VIEWS COMPARISON:  Sacrum done on 05/02/2022 FINDINGS: No displaced fracture or dislocation is seen. Linear smooth marginated calcification adjacent to the greater trochanter of right femur may suggest ligament calcification from previous injury. Degenerative changes are noted in the visualized lower lumbar spine. Small sclerotic density in left ischium may suggest benign bone island. IMPRESSION: No fracture or dislocation is seen.  Lumbar spondylosis. Electronically Signed   By: Ernie Avena M.D.   On: 07/31/2022 10:14   DG Chest Port 1 View  Result Date: 07/31/2022 CLINICAL DATA:  Trauma.  Falls. EXAM: PORTABLE CHEST 1 VIEW COMPARISON:  02/04/2021 FINDINGS: 0954 hours. The lungs are clear without focal pneumonia, edema, pneumothorax or pleural effusion. The cardiopericardial silhouette is within  normal limits for size. The visualized bony structures of the thorax are unremarkable. Telemetry leads overlie the chest. IMPRESSION: No active disease. Electronically Signed   By: Kennith Center M.D.   On: 07/31/2022 10:13    Microbiology: No results found for this or any previous visit.  Labs: CBC: Recent Labs  Lab 07/31/22 0955 07/31/22 0957  WBC 12.0*  --   HGB 9.8* 9.9*  HCT 31.1* 29.0*  MCV 101.6*  --   PLT 223  --    Basic Metabolic Panel: Recent Labs  Lab 07/31/22 0955 07/31/22 0957  NA 138 139  K 3.6 3.7  CL 105 105  CO2 22  --   GLUCOSE 181* 176*  BUN 30* 32*  CREATININE 1.66* 1.40*  CALCIUM 8.6*  --   MG 2.2  --    Liver Function Tests: Recent Labs  Lab 07/31/22 0955  AST 23  ALT 10  ALKPHOS 91  BILITOT 1.1  PROT 5.4*  ALBUMIN 3.3*   CBG: No results for input(s): "GLUCAP" in the last 168 hours.  Discharge time spent: 30 minutes on discharge   Signed: Orland Mustard, MD [facility] 07/31/2022

## 2022-07-31 NOTE — Consult Note (Signed)
Activation and Reason: Level 1, fall  Primary Survey:  Airway: Intact, talking Breathing: Bilateral bs Circulation: palpable pulses Disability: GCS 15  HPI: Rahmah Win is an 88 y.o. female hx afib (on xarelto), dementia, hypothyroidism presented after an unwitnessed fall earlier today. She had fall last night as well and was evaluated at [facility]. Today, had fall and was found with blood around her and possible seizure.   Arrived as level 1 and was evaluated with our PA and Dr. Hassell Done initially as I was in the operating room.  CT Head here today - 1. No acute intracranial abnormality. Similar appearance left frontal scalp hematoma. 2. Atrophy with chronic small vessel ischemic disease. 3. No evidence for cervical spine fracture or traumatic subluxation. 4. Degenerative changes in the cervical spine as above.   CT CAP today  1. Incompletely visualized subcutaneous hematoma superficial to the left greater trochanter measuring at least 4.6 x 2.9 x 5.9 cm with surrounding edema/hemorrhage. 2. No other acute traumatic injury identified in the chest, abdomen, or pelvis. 3. 4 mm medial left lower lobe pulmonary nodule is new in the interval. No follow-up needed if patient is low-risk.This recommendation follows the consensus statement: Guidelines for Management of Incidental Pulmonary Nodules Detected on CT Images: From the Fleischner Society 2017; Radiology 2017; 284:228-243. 4. Nodular hepatic contour suggests cirrhosis. 5. Left colonic diverticulosis without diverticulitis.  Here found to also be in afib/rvr   Of note, palliative medicine was consulted by ED based on family expressed wishes for consideration of hospice based on evolving goals of care in light of significant recent declines in health.  Past Medical History:  Diagnosis Date   A-fib [facility])    Hypertension    Thyroid disease     No family history on file.  Social:  has no history on file for  tobacco use, alcohol use, and drug use.  Allergies: No Known Allergies  Medications: I have reviewed the patient's current medications.  Results for orders placed or performed during the hospital encounter of 07/31/22 (from the past 48 hour(s))  Comprehensive metabolic panel     Status: Abnormal   Collection Time: 07/31/22  9:55 AM  Result Value Ref Range   Sodium 138 135 - 145 mmol/L   Potassium 3.6 3.5 - 5.1 mmol/L   Chloride 105 98 - 111 mmol/L   CO2 22 22 - 32 mmol/L   Glucose, Bld 181 (H) 70 - 99 mg/dL    Comment: Glucose reference range applies only to samples taken after fasting for at least 8 hours.   BUN 30 (H) 8 - 23 mg/dL   Creatinine, Ser 1.66 (H) 0.44 - 1.00 mg/dL   Calcium 8.6 (L) 8.9 - 10.3 mg/dL   Total Protein 5.4 (L) 6.5 - 8.1 g/dL   Albumin 3.3 (L) 3.5 - 5.0 g/dL   AST 23 15 - 41 U/L   ALT 10 0 - 44 U/L   Alkaline Phosphatase 91 38 - 126 U/L   Total Bilirubin 1.1 0.3 - 1.2 mg/dL   GFR, Estimated 29 (L) >60 mL/min    Comment: (NOTE) Calculated using the CKD-EPI Creatinine Equation (2021)    Anion gap 11 5 - 15    Comment: Performed at [facility], [place] [address] [place],  [address]  CBC     Status: Abnormal   Collection Time: 07/31/22  9:55 AM  Result Value Ref Range   WBC 12.0 (H) 4.0 - 10.5 K/uL  RBC 3.06 (L) 3.87 - 5.11 MIL/uL   Hemoglobin 9.8 (L) 12.0 - 15.0 g/dL   HCT 31.1 (L) 36.0 - 46.0 %   MCV 101.6 (H) 80.0 - 100.0 fL   MCH 32.0 26.0 - 34.0 pg   MCHC 31.5 30.0 - 36.0 g/dL   RDW 15.0 11.5 - 15.5 %   Platelets 223 150 - 400 K/uL   nRBC 0.0 0.0 - 0.2 %    Comment: Performed at [facility] [address] [place], [place] [address]  Lactic acid, plasma     Status: Abnormal   Collection Time: 07/31/22  9:55 AM  Result Value Ref Range   Lactic Acid, Venous 5.0 (HH) 0.5 - 1.9 mmol/L    Comment: CRITICAL RESULT CALLED TO, READ BACK BY AND VERIFIED WITH Harlow Asa RN 07/31/22 @1059  BY J. Mannix Kroeker Performed at [facility] [address] [place], [place] [address]   Protime-INR     Status: Abnormal   Collection Time: 07/31/22  9:55 AM  Result Value Ref Range   Prothrombin Time 18.9 (H) 11.4 - 15.2 seconds   INR 1.6 (H) 0.8 - 1.2    Comment: (NOTE) INR goal varies based on device and disease states. Performed at [facility], [place] [address], [place], [place] [address]   Sample to Blood Bank     Status: None   Collection Time: 07/31/22  9:55 AM  Result Value Ref Range   Blood Bank Specimen SAMPLE AVAILABLE FOR TESTING    Sample Expiration      08/01/2022,2359 Performed at [facility], [place] [address], [place], [place] [address]   Magnesium     Status: None   Collection Time: 07/31/22  9:55 AM  Result Value Ref Range   Magnesium 2.2 1.7 - 2.4 mg/dL    Comment: Performed at [facility] [address] [place], [place] [address]  Troponin I (High Sensitivity)     Status: None   Collection Time: 07/31/22  9:55 AM  Result Value Ref Range   Troponin I (High Sensitivity) 10 <18 ng/L    Comment: (NOTE) Elevated high sensitivity troponin I (hsTnI) values and significant  changes across serial measurements may suggest ACS but many other  chronic and acute conditions are known to elevate hsTnI results.  Refer to the "Links" section for chest pain algorithms and additional  guidance. Performed at [facility], [place] [address], [place], [place] [address]   Type and screen [place]     Status: None   Collection Time: 07/31/22  9:55 AM  Result Value Ref Range   ABO/RH(D) O POS    Antibody Screen NEG    Sample Expiration      08/03/2022,2359 Performed at [facility], [place] [address], [place], [place] [address]   I-Stat Chem 8, ED     Status: Abnormal   Collection Time: 07/31/22  9:57 AM  Result Value Ref Range   Sodium 139 135 - 145 mmol/L   Potassium 3.7 3.5 - 5.1 mmol/L   Chloride 105 98 - 111 mmol/L   BUN 32 (H) 8 - 23 mg/dL   Creatinine, Ser  1.40 (H) 0.44 - 1.00 mg/dL   Glucose, Bld 176 (H) 70 - 99 mg/dL    Comment: Glucose reference range applies only to samples taken after fasting for at least 8 hours.   Calcium, Ion 1.01 (L) 1.15 - 1.40 mmol/L  TCO2 22 22 - 32 mmol/L   Hemoglobin 9.9 (L) 12.0 - 15.0 g/dL   HCT 29.0 (L) 36.0 - 46.0 %    CT CHEST ABDOMEN PELVIS W CONTRAST  Result Date: 07/31/2022 CLINICAL DATA:  Blunt poly trauma. EXAM: CT CHEST, ABDOMEN, AND PELVIS WITH CONTRAST TECHNIQUE: Multidetector CT imaging of the chest, abdomen and pelvis was performed following the standard protocol during bolus administration of intravenous contrast. RADIATION DOSE REDUCTION: This exam was performed according to the departmental dose-optimization program which includes automated exposure control, adjustment of the mA and/or kV according to patient size and/or use of iterative reconstruction technique. CONTRAST:  61mL OMNIPAQUE IOHEXOL 350 MG/ML SOLN COMPARISON:  CTA chest abdomen pelvis 03/04/2018. FINDINGS: CT CHEST FINDINGS Cardiovascular: The heart size is normal. No substantial pericardial effusion. Mild atherosclerotic calcification is noted in the wall of the thoracic aorta. Mediastinum/Nodes: No mediastinal lymphadenopathy. There is no hilar lymphadenopathy. The esophagus has normal imaging features. There is no axillary lymphadenopathy. Lungs/Pleura: No evidence for pneumothorax or pleural effusion. No focal airspace consolidation. No pleural effusion 4 mm medial left lower lobe nodule on image 116/5 is new in the interval. Musculoskeletal: No worrisome lytic or sclerotic osseous abnormality. CT ABDOMEN PELVIS FINDINGS Hepatobiliary: Nodular hepatic contour suggests cirrhosis. No suspicious focal abnormality within the liver parenchyma. Gallbladder surgically absent. Common bile duct dilated up to 8 mm diameter, upper normal for patient age in similar to the 2019 exam. Pancreas: No focal mass lesion. No dilatation of the main duct. No  intraparenchymal cyst. No peripancreatic edema. Spleen: No splenomegaly. No focal mass lesion. Adrenals/Urinary Tract: No adrenal nodule or mass. Cortical scarring noted both kidneys without hydronephrosis. No evidence for hydroureter. The urinary bladder appears normal for the degree of distention. Stomach/Bowel: Stomach is unremarkable. No gastric wall thickening. No evidence of outlet obstruction. Duodenum is normally positioned as is the ligament of Treitz. No small bowel wall thickening. No small bowel dilatation. The terminal ileum is normal. The appendix is normal. No gross colonic mass. No colonic wall thickening. Diverticular changes are noted in the left colon without evidence of diverticulitis. Vascular/Lymphatic: There is mild atherosclerotic calcification of the abdominal aorta without aneurysm. There is no gastrohepatic or hepatoduodenal ligament lymphadenopathy. No retroperitoneal or mesenteric lymphadenopathy. No pelvic sidewall lymphadenopathy. Reproductive: The uterus is unremarkable.  There is no adnexal mass. Other: No intraperitoneal free fluid. Musculoskeletal: No worrisome lytic or sclerotic osseous abnormality. No evidence for lumbar spine or bony pelvis fracture. No femoral neck fracture evident. Incompletely visualized subcutaneous hematoma identified superficial to the left greater trochanter measuring at least 4.6 x 2.9 x 5.9 cm with surrounding edema/hemorrhage. IMPRESSION: 1. Incompletely visualized subcutaneous hematoma superficial to the left greater trochanter measuring at least 4.6 x 2.9 x 5.9 cm with surrounding edema/hemorrhage. 2. No other acute traumatic injury identified in the chest, abdomen, or pelvis. 3. 4 mm medial left lower lobe pulmonary nodule is new in the interval. No follow-up needed if patient is low-risk.This recommendation follows the consensus statement: Guidelines for Management of Incidental Pulmonary Nodules Detected on CT Images: From the Fleischner Society  2017; Radiology 2017; 284:228-243. 4. Nodular hepatic contour suggests cirrhosis. 5. Left colonic diverticulosis without diverticulitis. 6.  Aortic Atherosclerosis (ICD10-I70.0). Subcutaneous hematoma in the left hip with absence of other acute findings discussed with Dr. Lovena Neighbours at approximately 1039 hours on 07/31/2022. Electronically Signed   By: Misty Stanley M.D.   On: 07/31/2022 10:54   CT CERVICAL SPINE WO CONTRAST  Result Date: 07/31/2022 CLINICAL DATA:  Moderate to severe head trauma. EXAM: CT HEAD WITHOUT CONTRAST CT CERVICAL SPINE WITHOUT CONTRAST TECHNIQUE: Multidetector CT imaging of the head and cervical spine was performed following the standard protocol without intravenous contrast. Multiplanar CT image reconstructions of the cervical spine were also generated. RADIATION DOSE REDUCTION: This exam was performed according to the departmental dose-optimization program which includes automated exposure control, adjustment of the mA and/or kV according to patient size and/or use of iterative reconstruction technique. COMPARISON:  Head CT earlier the same day at 0323 hours. Head and cervical spine CT from 07/24/2022. FINDINGS: CT HEAD FINDINGS Brain: There is no evidence for acute hemorrhage, hydrocephalus, mass lesion, or abnormal extra-axial fluid collection. No definite CT evidence for acute infarction. Diffuse loss of parenchymal volume is consistent with atrophy. Patchy low attenuation in the deep hemispheric and periventricular Hawken Bielby matter is nonspecific, but likely reflects chronic microvascular ischemic demyelination. Vascular: No hyperdense vessel or unexpected calcification. Skull: No evidence for fracture. No worrisome lytic or sclerotic lesion. Sinuses/Orbits: The visualized paranasal sinuses and mastoid air cells are clear. Visualized portions of the globes and intraorbital fat are unremarkable. Other: Left frontal scalp hematoma again noted, not substantially changed. CT CERVICAL SPINE  FINDINGS Alignment: Straightening of normal cervical lordosis without evidence for traumatic subluxation. Skull base and vertebrae: No acute fracture. No primary bone lesion or focal pathologic process. Soft tissues and spinal canal: No prevertebral fluid or swelling. No visible canal hematoma. Disc levels: Diffuse loss of disc height noted from C3-4 down to C5-6. Facets are well aligned bilaterally. Upper chest: See dedicated chest CT performed at the same time. Other: None. IMPRESSION: 1. No acute intracranial abnormality. Similar appearance left frontal scalp hematoma. 2. Atrophy with chronic small vessel ischemic disease. 3. No evidence for cervical spine fracture or traumatic subluxation. 4. Degenerative changes in the cervical spine as above. Electronically Signed   By: Misty Stanley M.D.   On: 07/31/2022 10:31   CT HEAD WO CONTRAST  Result Date: 07/31/2022 CLINICAL DATA:  Moderate to severe head trauma. EXAM: CT HEAD WITHOUT CONTRAST CT CERVICAL SPINE WITHOUT CONTRAST TECHNIQUE: Multidetector CT imaging of the head and cervical spine was performed following the standard protocol without intravenous contrast. Multiplanar CT image reconstructions of the cervical spine were also generated. RADIATION DOSE REDUCTION: This exam was performed according to the departmental dose-optimization program which includes automated exposure control, adjustment of the mA and/or kV according to patient size and/or use of iterative reconstruction technique. COMPARISON:  Head CT earlier the same day at 0323 hours. Head and cervical spine CT from 07/24/2022. FINDINGS: CT HEAD FINDINGS Brain: There is no evidence for acute hemorrhage, hydrocephalus, mass lesion, or abnormal extra-axial fluid collection. No definite CT evidence for acute infarction. Diffuse loss of parenchymal volume is consistent with atrophy. Patchy low attenuation in the deep hemispheric and periventricular [place] Hinkson matter is nonspecific, but likely reflects  chronic microvascular ischemic demyelination. Vascular: No hyperdense vessel or unexpected calcification. Skull: No evidence for fracture. No worrisome lytic or sclerotic lesion. Sinuses/Orbits: The visualized paranasal sinuses and mastoid air cells are clear. Visualized portions of the globes and intraorbital fat are unremarkable. Other: Left frontal scalp hematoma again noted, not substantially changed. CT CERVICAL SPINE FINDINGS Alignment: Straightening of normal cervical lordosis without evidence for traumatic subluxation. Skull base and vertebrae: No acute fracture. No primary bone lesion or focal pathologic process. Soft tissues and spinal canal: No prevertebral fluid or swelling. No visible canal hematoma. Disc levels: Diffuse loss of disc height noted from  C3-4 down to C5-6. Facets are well aligned bilaterally. Upper chest: See dedicated chest CT performed at the same time. Other: None. IMPRESSION: 1. No acute intracranial abnormality. Similar appearance left frontal scalp hematoma. 2. Atrophy with chronic small vessel ischemic disease. 3. No evidence for cervical spine fracture or traumatic subluxation. 4. Degenerative changes in the cervical spine as above. Electronically Signed   By: Misty Stanley M.D.   On: 07/31/2022 10:31   DG Pelvis Portable  Result Date: 07/31/2022 CLINICAL DATA:  Multiple falls EXAM: PORTABLE PELVIS 1-2 VIEWS COMPARISON:  Sacrum done on 05/02/2022 FINDINGS: No displaced fracture or dislocation is seen. Linear smooth marginated calcification adjacent to the greater trochanter of right femur may suggest ligament calcification from previous injury. Degenerative changes are noted in the visualized lower lumbar spine. Small sclerotic density in left ischium may suggest benign bone island. IMPRESSION: No fracture or dislocation is seen.  Lumbar spondylosis. Electronically Signed   By: Elmer Picker M.D.   On: 07/31/2022 10:14   DG Chest Port 1 View  Result Date:  07/31/2022 CLINICAL DATA:  Trauma.  Falls. EXAM: PORTABLE CHEST 1 VIEW COMPARISON:  02/04/2021 FINDINGS: 0954 hours. The lungs are clear without focal pneumonia, edema, pneumothorax or pleural effusion. The cardiopericardial silhouette is within normal limits for size. The visualized bony structures of the thorax are unremarkable. Telemetry leads overlie the chest. IMPRESSION: No active disease. Electronically Signed   By: Misty Stanley M.D.   On: 07/31/2022 10:13    ROS -Unable to obtain due to condition of patient  PE Blood pressure (!) 89/59, pulse 100, temperature (!) 97 F (36.1 C), temperature source Temporal, resp. rate (!) 23, height 5\' 5"  (1.651 m), weight 61.7 kg, SpO2 99 %. Physical Exam Constitutional: NAD; not conversant, resting; ecchymoses of various ages to head, face, neck Neck: Trachea midline Lungs: Normal respiratory effort   Results for orders placed or performed during the hospital encounter of 07/31/22 (from the past 48 hour(s))  Comprehensive metabolic panel     Status: Abnormal   Collection Time: 07/31/22  9:55 AM  Result Value Ref Range   Sodium 138 135 - 145 mmol/L   Potassium 3.6 3.5 - 5.1 mmol/L   Chloride 105 98 - 111 mmol/L   CO2 22 22 - 32 mmol/L   Glucose, Bld 181 (H) 70 - 99 mg/dL    Comment: Glucose reference range applies only to samples taken after fasting for at least 8 hours.   BUN 30 (H) 8 - 23 mg/dL   Creatinine, Ser 1.66 (H) 0.44 - 1.00 mg/dL   Calcium 8.6 (L) 8.9 - 10.3 mg/dL   Total Protein 5.4 (L) 6.5 - 8.1 g/dL   Albumin 3.3 (L) 3.5 - 5.0 g/dL   AST 23 15 - 41 U/L   ALT 10 0 - 44 U/L   Alkaline Phosphatase 91 38 - 126 U/L   Total Bilirubin 1.1 0.3 - 1.2 mg/dL   GFR, Estimated 29 (L) >60 mL/min    Comment: (NOTE) Calculated using the CKD-EPI Creatinine Equation (2021)    Anion gap 11 5 - 15    Comment: Performed at [facility], [place] [address], [place] [address]  CBC     Status: Abnormal   Collection Time: 07/31/22   9:55 AM  Result Value Ref Range   WBC 12.0 (H) 4.0 - 10.5 K/uL   RBC 3.06 (L) 3.87 - 5.11 MIL/uL   Hemoglobin 9.8 (L) 12.0 - 15.0 g/dL  HCT 31.1 (L) 36.0 - 46.0 %   MCV 101.6 (H) 80.0 - 100.0 fL   MCH 32.0 26.0 - 34.0 pg   MCHC 31.5 30.0 - 36.0 g/dL   RDW 84.1 32.4 - 40.1 %   Platelets 223 150 - 400 K/uL   nRBC 0.0 0.0 - 0.2 %    Comment: Performed at [facility], [address], [place], [place] [address]  Lactic acid, plasma     Status: Abnormal   Collection Time: 07/31/22  9:55 AM  Result Value Ref Range   Lactic Acid, Venous 5.0 (HH) 0.5 - 1.9 mmol/L    Comment: CRITICAL RESULT CALLED TO, READ BACK BY AND VERIFIED WITH Loma Messing RN 07/31/22 @1059  BY J. Avir Deruiter Performed at [facility], [address], [place], [place] [address]   Protime-INR     Status: Abnormal   Collection Time: 07/31/22  9:55 AM  Result Value Ref Range   Prothrombin Time 18.9 (H) 11.4 - 15.2 seconds   INR 1.6 (H) 0.8 - 1.2    Comment: (NOTE) INR goal varies based on device and disease states. Performed at [facility], [address], [place], [place] [address]   Sample to Blood Bank     Status: None   Collection Time: 07/31/22  9:55 AM  Result Value Ref Range   Blood Bank Specimen SAMPLE AVAILABLE FOR TESTING    Sample Expiration      08/01/2022,2359 Performed at [facility], [address], [place], [place] [address]   Magnesium     Status: None   Collection Time: 07/31/22  9:55 AM  Result Value Ref Range   Magnesium 2.2 1.7 - 2.4 mg/dL    Comment: Performed at [facility], [address], [place], [place] [address]  Troponin I (High Sensitivity)     Status: None   Collection Time: 07/31/22  9:55 AM  Result Value Ref Range   Troponin I (High Sensitivity) 10 <18 ng/L    Comment: (NOTE) Elevated high sensitivity troponin I (hsTnI) values and significant  changes across serial measurements may suggest ACS but many other  chronic and acute conditions are known to  elevate hsTnI results.  Refer to the "Links" section for chest pain algorithms and additional  guidance. Performed at [facility], [address], [place], [place] [address]   Type and screen [facility]     Status: None   Collection Time: 07/31/22  9:55 AM  Result Value Ref Range   ABO/RH(D) O POS    Antibody Screen NEG    Sample Expiration      08/03/2022,2359 Performed at [facility], [address], [place], [place] [address]   I-Stat Chem 8, ED     Status: Abnormal   Collection Time: 07/31/22  9:57 AM  Result Value Ref Range   Sodium 139 135 - 145 mmol/L   Potassium 3.7 3.5 - 5.1 mmol/L   Chloride 105 98 - 111 mmol/L   BUN 32 (H) 8 - 23 mg/dL   Creatinine, Ser 1.66 (H) 0.44 - 1.00 mg/dL   Glucose, Bld 063 (H) 70 - 99 mg/dL    Comment: Glucose reference range applies only to samples taken after fasting for at least 8 hours.   Calcium, Ion 1.01 (L) 1.15 - 1.40 mmol/L   TCO2 22 22 - 32 mmol/L   Hemoglobin 9.9 (L) 12.0 - 15.0 g/dL  HCT 29.0 (L) 36.0 - 46.0 %    CT CHEST ABDOMEN PELVIS W CONTRAST  Result Date: 07/31/2022 CLINICAL DATA:  Blunt poly trauma. EXAM: CT CHEST, ABDOMEN, AND PELVIS WITH CONTRAST TECHNIQUE: Multidetector CT imaging of the chest, abdomen and pelvis was performed following the standard protocol during bolus administration of intravenous contrast. RADIATION DOSE REDUCTION: This exam was performed according to the departmental dose-optimization program which includes automated exposure control, adjustment of the mA and/or kV according to patient size and/or use of iterative reconstruction technique. CONTRAST:  39mL OMNIPAQUE IOHEXOL 350 MG/ML SOLN COMPARISON:  CTA chest abdomen pelvis 03/04/2018. FINDINGS: CT CHEST FINDINGS Cardiovascular: The heart size is normal. No substantial pericardial effusion. Mild atherosclerotic calcification is noted in the wall of the thoracic aorta. Mediastinum/Nodes: No mediastinal lymphadenopathy.  There is no hilar lymphadenopathy. The esophagus has normal imaging features. There is no axillary lymphadenopathy. Lungs/Pleura: No evidence for pneumothorax or pleural effusion. No focal airspace consolidation. No pleural effusion 4 mm medial left lower lobe nodule on image 116/5 is new in the interval. Musculoskeletal: No worrisome lytic or sclerotic osseous abnormality. CT ABDOMEN PELVIS FINDINGS Hepatobiliary: Nodular hepatic contour suggests cirrhosis. No suspicious focal abnormality within the liver parenchyma. Gallbladder surgically absent. Common bile duct dilated up to 8 mm diameter, upper normal for patient age in similar to the 2019 exam. Pancreas: No focal mass lesion. No dilatation of the main duct. No intraparenchymal cyst. No peripancreatic edema. Spleen: No splenomegaly. No focal mass lesion. Adrenals/Urinary Tract: No adrenal nodule or mass. Cortical scarring noted both kidneys without hydronephrosis. No evidence for hydroureter. The urinary bladder appears normal for the degree of distention. Stomach/Bowel: Stomach is unremarkable. No gastric wall thickening. No evidence of outlet obstruction. Duodenum is normally positioned as is the ligament of Treitz. No small bowel wall thickening. No small bowel dilatation. The terminal ileum is normal. The appendix is normal. No gross colonic mass. No colonic wall thickening. Diverticular changes are noted in the left colon without evidence of diverticulitis. Vascular/Lymphatic: There is mild atherosclerotic calcification of the abdominal aorta without aneurysm. There is no gastrohepatic or hepatoduodenal ligament lymphadenopathy. No retroperitoneal or mesenteric lymphadenopathy. No pelvic sidewall lymphadenopathy. Reproductive: The uterus is unremarkable.  There is no adnexal mass. Other: No intraperitoneal free fluid. Musculoskeletal: No worrisome lytic or sclerotic osseous abnormality. No evidence for lumbar spine or bony pelvis fracture. No femoral neck  fracture evident. Incompletely visualized subcutaneous hematoma identified superficial to the left greater trochanter measuring at least 4.6 x 2.9 x 5.9 cm with surrounding edema/hemorrhage. IMPRESSION: 1. Incompletely visualized subcutaneous hematoma superficial to the left greater trochanter measuring at least 4.6 x 2.9 x 5.9 cm with surrounding edema/hemorrhage. 2. No other acute traumatic injury identified in the chest, abdomen, or pelvis. 3. 4 mm medial left lower lobe pulmonary nodule is new in the interval. No follow-up needed if patient is low-risk.This recommendation follows the consensus statement: Guidelines for Management of Incidental Pulmonary Nodules Detected on CT Images: From the Fleischner Society 2017; Radiology 2017; 284:[id number]. 4. Nodular hepatic contour suggests cirrhosis. 5. Left colonic diverticulosis without diverticulitis. 6.  Aortic Atherosclerosis (ICD10-I70.0). Subcutaneous hematoma in the left hip with absence of other acute findings discussed with Dr. Lovena Neighbours at approximately 1039 hours on 07/31/2022. Electronically Signed   By: Misty Stanley M.D.   On: 07/31/2022 10:54   CT CERVICAL SPINE WO CONTRAST  Result Date: 07/31/2022 CLINICAL DATA:  Moderate to severe head trauma. EXAM: CT HEAD WITHOUT CONTRAST CT CERVICAL SPINE WITHOUT CONTRAST TECHNIQUE:  Multidetector CT imaging of the head and cervical spine was performed following the standard protocol without intravenous contrast. Multiplanar CT image reconstructions of the cervical spine were also generated. RADIATION DOSE REDUCTION: This exam was performed according to the departmental dose-optimization program which includes automated exposure control, adjustment of the mA and/or kV according to patient size and/or use of iterative reconstruction technique. COMPARISON:  Head CT earlier the same day at 0323 hours. Head and cervical spine CT from 07/24/2022. FINDINGS: CT HEAD FINDINGS Brain: There is no evidence for acute  hemorrhage, hydrocephalus, mass lesion, or abnormal extra-axial fluid collection. No definite CT evidence for acute infarction. Diffuse loss of parenchymal volume is consistent with atrophy. Patchy low attenuation in the deep hemispheric and periventricular Shacoria Latif matter is nonspecific, but likely reflects chronic microvascular ischemic demyelination. Vascular: No hyperdense vessel or unexpected calcification. Skull: No evidence for fracture. No worrisome lytic or sclerotic lesion. Sinuses/Orbits: The visualized paranasal sinuses and mastoid air cells are clear. Visualized portions of the globes and intraorbital fat are unremarkable. Other: Left frontal scalp hematoma again noted, not substantially changed. CT CERVICAL SPINE FINDINGS Alignment: Straightening of normal cervical lordosis without evidence for traumatic subluxation. Skull base and vertebrae: No acute fracture. No primary bone lesion or focal pathologic process. Soft tissues and spinal canal: No prevertebral fluid or swelling. No visible canal hematoma. Disc levels: Diffuse loss of disc height noted from C3-4 down to C5-6. Facets are well aligned bilaterally. Upper chest: See dedicated chest CT performed at the same time. Other: None. IMPRESSION: 1. No acute intracranial abnormality. Similar appearance left frontal scalp hematoma. 2. Atrophy with chronic small vessel ischemic disease. 3. No evidence for cervical spine fracture or traumatic subluxation. 4. Degenerative changes in the cervical spine as above. Electronically Signed   By: Misty Stanley M.D.   On: 07/31/2022 10:31   CT HEAD WO CONTRAST  Result Date: 07/31/2022 CLINICAL DATA:  Moderate to severe head trauma. EXAM: CT HEAD WITHOUT CONTRAST CT CERVICAL SPINE WITHOUT CONTRAST TECHNIQUE: Multidetector CT imaging of the head and cervical spine was performed following the standard protocol without intravenous contrast. Multiplanar CT image reconstructions of the cervical spine were also  generated. RADIATION DOSE REDUCTION: This exam was performed according to the departmental dose-optimization program which includes automated exposure control, adjustment of the mA and/or kV according to patient size and/or use of iterative reconstruction technique. COMPARISON:  Head CT earlier the same day at 0323 hours. Head and cervical spine CT from 07/24/2022. FINDINGS: CT HEAD FINDINGS Brain: There is no evidence for acute hemorrhage, hydrocephalus, mass lesion, or abnormal extra-axial fluid collection. No definite CT evidence for acute infarction. Diffuse loss of parenchymal volume is consistent with atrophy. Patchy low attenuation in the deep hemispheric and periventricular Nailah Luepke matter is nonspecific, but likely reflects chronic microvascular ischemic demyelination. Vascular: No hyperdense vessel or unexpected calcification. Skull: No evidence for fracture. No worrisome lytic or sclerotic lesion. Sinuses/Orbits: The visualized paranasal sinuses and mastoid air cells are clear. Visualized portions of the globes and intraorbital fat are unremarkable. Other: Left frontal scalp hematoma again noted, not substantially changed. CT CERVICAL SPINE FINDINGS Alignment: Straightening of normal cervical lordosis without evidence for traumatic subluxation. Skull base and vertebrae: No acute fracture. No primary bone lesion or focal pathologic process. Soft tissues and spinal canal: No prevertebral fluid or swelling. No visible canal hematoma. Disc levels: Diffuse loss of disc height noted from C3-4 down to C5-6. Facets are well aligned bilaterally. Upper chest: See dedicated chest CT performed  at the same time. Other: None. IMPRESSION: 1. No acute intracranial abnormality. Similar appearance left frontal scalp hematoma. 2. Atrophy with chronic small vessel ischemic disease. 3. No evidence for cervical spine fracture or traumatic subluxation. 4. Degenerative changes in the cervical spine as above. Electronically Signed    By: Misty Stanley M.D.   On: 07/31/2022 10:31   DG Pelvis Portable  Result Date: 07/31/2022 CLINICAL DATA:  Multiple falls EXAM: PORTABLE PELVIS 1-2 VIEWS COMPARISON:  Sacrum done on 05/02/2022 FINDINGS: No displaced fracture or dislocation is seen. Linear smooth marginated calcification adjacent to the greater trochanter of right femur may suggest ligament calcification from previous injury. Degenerative changes are noted in the visualized lower lumbar spine. Small sclerotic density in left ischium may suggest benign bone island. IMPRESSION: No fracture or dislocation is seen.  Lumbar spondylosis. Electronically Signed   By: Elmer Picker M.D.   On: 07/31/2022 10:14   DG Chest Port 1 View  Result Date: 07/31/2022 CLINICAL DATA:  Trauma.  Falls. EXAM: PORTABLE CHEST 1 VIEW COMPARISON:  02/04/2021 FINDINGS: 0954 hours. The lungs are clear without focal pneumonia, edema, pneumothorax or pleural effusion. The cardiopericardial silhouette is within normal limits for size. The visualized bony structures of the thorax are unremarkable. Telemetry leads overlie the chest. IMPRESSION: No active disease. Electronically Signed   By: Misty Stanley M.D.   On: 07/31/2022 10:13      Assessment/Plan: 89yoF s/p multiple recent falls, most recently today  -Scalp lac - s/p repair by EDP -Numerous ecchymoses on face/head from recent falls - no evidence of intra-cranial trauma  Dispo - as per family, they express the current quality of life she has experienced over the last couple months including wheel chair bound in memory care unit are not what she would have wanted. They express that she would elect for comfort based care under current scenarios and are moving forward with these plans.  Trauma will remain available to assist in her care as needed moving forward as well - please reach out if we can be of further assistance.  I spent a total of 65 minutes in both face-to-face and non-face-to-face  activities, excluding procedures performed, for this visit on the date of this encounter.   Nadeen Landau, [facility], [place]

## 2022-07-31 NOTE — H&P (Signed)
History and Physical    Patient: Belinda [name]:[id number] DOB: 04-11-1932 DOA: 07/31/2022 DOS: the patient was seen and examined on 07/31/2022 PCP: [name], DO  Patient coming from: SNF - memory care    Chief Complaint: fall and bleeding   HPI: Belinda Anderson is a 88 y.o. female with medical history significant of atrial fibrillation on xarelto, dementia, hypothyroidism, diastolic CHF, hx of CVA, multiple falls who presented to ED after unwitnessed fall today and seizure like activity.  She had a fall on the 8th and was seen at [facility] and was found to have a large forehead hematoma. Head CT was okay and she was stable to return home. Xarelto was held at that time and has not been continued. She was found lats night bleeding from her scalp hematoma and was evaluated again at [facility]. Repeat head CT scans okay and she was observed and ultimately discharged.  Family states she hadn't been home an hour and fell again.Marland Kitchen She was found again today on the floor in a puddle of blood and witnessed with seizure like activity. She came in with afib with RVR and hypotensive. She can provide no history but family is present.   At baseline she is in wheelchair, but thinks she can still get up and use bathroom which has led to weekly falls. She is mainly non verbal, smiles but sometimes talks. She remembers most of her siblings, but forgets her daughter at times. She has had decreased PO intake, nibbles at food. Family states she had had a noticeable decline over past few months.   ER Course:  vitals: afebrile, bp: 110/68, HR: 130, RR: 36, oxygen: 100%RA Pertinent labs: wbc: 12.0, hgb: 9.8, BUN: 80, creatinine: 1.66, lactic acid: 5.0,  CT: no acute finding  CT abdomen/pelvis: subcutaneous hematoma superficial to the left greater trochanter measuring at least 4.6x2.9x5.9cm with surrounding edema/hemorrhage. No other acute finding. 4mm LLL pulmonary nodule. Nodular hepatic contour suggests  cirrhosis.  CXR: no acute finding Pelvis xray: no acute finding  In ED: given 500cc bolus and palliative care consulted for full comfort care.     Review of Systems: unable to review all systems due to the inability of the patient to answer questions. Past Medical History:  Diagnosis Date   A-fib [facility])    Hypertension    Thyroid disease     Social History:  has no history on file for tobacco use, alcohol use, and drug use.  No Known Allergies  No family history on file.  Prior to Admission medications   Not on File    Physical Exam: Vitals:   07/31/22 1130 07/31/22 1145 07/31/22 1147 07/31/22 1215  BP: (!) 90/52 90/61  (!) 89/59  Pulse: (!) 106 99  100  Resp: 18 15  (!) 23  Temp:      TempSrc:      SpO2: 99% 100%  99%  Weight:   61.7 kg   Height:   5\' 5"  (1.651 m)    General:  Appears calm and comfortable and is in NAD. Hematoma that is stitched on left forehead, dried blood and multiple ecchymosis over head.  Eyes:  PERRL, EOMI, normal lids, iris ENT:   lips & tongue, mmm; appropriate dentition Neck:  no LAD, masses or thyromegaly; no carotid bruits Cardiovascular:  irregularly irregular, no m/r/g. No LE edema.  Respiratory:   CTA bilaterally with no wheezes/rales/rhonchi.  Normal respiratory effort. Abdomen:  soft, NT, ND, NABS Back:   normal alignment,  no CVAT Skin:  dried blood over body/feet/head. Large ecchymosis and  hematoma of left hip. Sutured hematoma of left forehead   Musculoskeletal:  appears to have normal tone.  Lower extremity:  No LE edema.  Limited foot exam with no ulcerations.  2+ distal pulses. Psychiatric:  can not test  Neurologic:  moves extremities spontaneously. Does not open eyes or interact. Tone appears wnl.    Radiological Exams on Admission: Independently reviewed - see discussion in A/P where applicable  CT CHEST ABDOMEN PELVIS W CONTRAST  Result Date: 07/31/2022 CLINICAL DATA:  Blunt poly trauma. EXAM: CT CHEST, ABDOMEN,  AND PELVIS WITH CONTRAST TECHNIQUE: Multidetector CT imaging of the chest, abdomen and pelvis was performed following the standard protocol during bolus administration of intravenous contrast. RADIATION DOSE REDUCTION: This exam was performed according to the departmental dose-optimization program which includes automated exposure control, adjustment of the mA and/or kV according to patient size and/or use of iterative reconstruction technique. CONTRAST:  80mL OMNIPAQUE IOHEXOL 350 MG/ML SOLN COMPARISON:  CTA chest abdomen pelvis 03/04/2018. FINDINGS: CT CHEST FINDINGS Cardiovascular: The heart size is normal. No substantial pericardial effusion. Mild atherosclerotic calcification is noted in the wall of the thoracic aorta. Mediastinum/Nodes: No mediastinal lymphadenopathy. There is no hilar lymphadenopathy. The esophagus has normal imaging features. There is no axillary lymphadenopathy. Lungs/Pleura: No evidence for pneumothorax or pleural effusion. No focal airspace consolidation. No pleural effusion 4 mm medial left lower lobe nodule on image 116/5 is new in the interval. Musculoskeletal: No worrisome lytic or sclerotic osseous abnormality. CT ABDOMEN PELVIS FINDINGS Hepatobiliary: Nodular hepatic contour suggests cirrhosis. No suspicious focal abnormality within the liver parenchyma. Gallbladder surgically absent. Common bile duct dilated up to 8 mm diameter, upper normal for patient age in similar to the 2019 exam. Pancreas: No focal mass lesion. No dilatation of the main duct. No intraparenchymal cyst. No peripancreatic edema. Spleen: No splenomegaly. No focal mass lesion. Adrenals/Urinary Tract: No adrenal nodule or mass. Cortical scarring noted both kidneys without hydronephrosis. No evidence for hydroureter. The urinary bladder appears normal for the degree of distention. Stomach/Bowel: Stomach is unremarkable. No gastric wall thickening. No evidence of outlet obstruction. Duodenum is normally positioned as  is the ligament of Treitz. No small bowel wall thickening. No small bowel dilatation. The terminal ileum is normal. The appendix is normal. No gross colonic mass. No colonic wall thickening. Diverticular changes are noted in the left colon without evidence of diverticulitis. Vascular/Lymphatic: There is mild atherosclerotic calcification of the abdominal aorta without aneurysm. There is no gastrohepatic or hepatoduodenal ligament lymphadenopathy. No retroperitoneal or mesenteric lymphadenopathy. No pelvic sidewall lymphadenopathy. Reproductive: The uterus is unremarkable.  There is no adnexal mass. Other: No intraperitoneal free fluid. Musculoskeletal: No worrisome lytic or sclerotic osseous abnormality. No evidence for lumbar spine or bony pelvis fracture. No femoral neck fracture evident. Incompletely visualized subcutaneous hematoma identified superficial to the left greater trochanter measuring at least 4.6 x 2.9 x 5.9 cm with surrounding edema/hemorrhage. IMPRESSION: 1. Incompletely visualized subcutaneous hematoma superficial to the left greater trochanter measuring at least 4.6 x 2.9 x 5.9 cm with surrounding edema/hemorrhage. 2. No other acute traumatic injury identified in the chest, abdomen, or pelvis. 3. 4 mm medial left lower lobe pulmonary nodule is new in the interval. No follow-up needed if patient is low-risk.This recommendation follows the consensus statement: Guidelines for Management of Incidental Pulmonary Nodules Detected on CT Images: From the Fleischner Society 2017; Radiology 2017; 284:228-243. 4. Nodular hepatic contour suggests cirrhosis. 5. Left  colonic diverticulosis without diverticulitis. 6.  Aortic Atherosclerosis (ICD10-I70.0). Subcutaneous hematoma in the left hip with absence of other acute findings discussed with Dr. Liliane Shi at approximately 1039 hours on 07/31/2022. Electronically Signed   By: Kennith Center M.D.   On: 07/31/2022 10:54   CT CERVICAL SPINE WO CONTRAST  Result  Date: 07/31/2022 CLINICAL DATA:  Moderate to severe head trauma. EXAM: CT HEAD WITHOUT CONTRAST CT CERVICAL SPINE WITHOUT CONTRAST TECHNIQUE: Multidetector CT imaging of the head and cervical spine was performed following the standard protocol without intravenous contrast. Multiplanar CT image reconstructions of the cervical spine were also generated. RADIATION DOSE REDUCTION: This exam was performed according to the departmental dose-optimization program which includes automated exposure control, adjustment of the mA and/or kV according to patient size and/or use of iterative reconstruction technique. COMPARISON:  Head CT earlier the same day at 0323 hours. Head and cervical spine CT from 07/24/2022. FINDINGS: CT HEAD FINDINGS Brain: There is no evidence for acute hemorrhage, hydrocephalus, mass lesion, or abnormal extra-axial fluid collection. No definite CT evidence for acute infarction. Diffuse loss of parenchymal volume is consistent with atrophy. Patchy low attenuation in the deep hemispheric and periventricular white matter is nonspecific, but likely reflects chronic microvascular ischemic demyelination. Vascular: No hyperdense vessel or unexpected calcification. Skull: No evidence for fracture. No worrisome lytic or sclerotic lesion. Sinuses/Orbits: The visualized paranasal sinuses and mastoid air cells are clear. Visualized portions of the globes and intraorbital fat are unremarkable. Other: Left frontal scalp hematoma again noted, not substantially changed. CT CERVICAL SPINE FINDINGS Alignment: Straightening of normal cervical lordosis without evidence for traumatic subluxation. Skull base and vertebrae: No acute fracture. No primary bone lesion or focal pathologic process. Soft tissues and spinal canal: No prevertebral fluid or swelling. No visible canal hematoma. Disc levels: Diffuse loss of disc height noted from C3-4 down to C5-6. Facets are well aligned bilaterally. Upper chest: See dedicated chest  CT performed at the same time. Other: None. IMPRESSION: 1. No acute intracranial abnormality. Similar appearance left frontal scalp hematoma. 2. Atrophy with chronic small vessel ischemic disease. 3. No evidence for cervical spine fracture or traumatic subluxation. 4. Degenerative changes in the cervical spine as above. Electronically Signed   By: Kennith Center M.D.   On: 07/31/2022 10:31   CT HEAD WO CONTRAST  Result Date: 07/31/2022 CLINICAL DATA:  Moderate to severe head trauma. EXAM: CT HEAD WITHOUT CONTRAST CT CERVICAL SPINE WITHOUT CONTRAST TECHNIQUE: Multidetector CT imaging of the head and cervical spine was performed following the standard protocol without intravenous contrast. Multiplanar CT image reconstructions of the cervical spine were also generated. RADIATION DOSE REDUCTION: This exam was performed according to the departmental dose-optimization program which includes automated exposure control, adjustment of the mA and/or kV according to patient size and/or use of iterative reconstruction technique. COMPARISON:  Head CT earlier the same day at 0323 hours. Head and cervical spine CT from 07/24/2022. FINDINGS: CT HEAD FINDINGS Brain: There is no evidence for acute hemorrhage, hydrocephalus, mass lesion, or abnormal extra-axial fluid collection. No definite CT evidence for acute infarction. Diffuse loss of parenchymal volume is consistent with atrophy. Patchy low attenuation in the deep hemispheric and periventricular white matter is nonspecific, but likely reflects chronic microvascular ischemic demyelination. Vascular: No hyperdense vessel or unexpected calcification. Skull: No evidence for fracture. No worrisome lytic or sclerotic lesion. Sinuses/Orbits: The visualized paranasal sinuses and mastoid air cells are clear. Visualized portions of the globes and intraorbital fat are unremarkable. Other: Left frontal scalp  hematoma again noted, not substantially changed. CT CERVICAL SPINE FINDINGS  Alignment: Straightening of normal cervical lordosis without evidence for traumatic subluxation. Skull base and vertebrae: No acute fracture. No primary bone lesion or focal pathologic process. Soft tissues and spinal canal: No prevertebral fluid or swelling. No visible canal hematoma. Disc levels: Diffuse loss of disc height noted from C3-4 down to C5-6. Facets are well aligned bilaterally. Upper chest: See dedicated chest CT performed at the same time. Other: None. IMPRESSION: 1. No acute intracranial abnormality. Similar appearance left frontal scalp hematoma. 2. Atrophy with chronic small vessel ischemic disease. 3. No evidence for cervical spine fracture or traumatic subluxation. 4. Degenerative changes in the cervical spine as above. Electronically Signed   By: Kennith Center M.D.   On: 07/31/2022 10:31   DG Pelvis Portable  Result Date: 07/31/2022 CLINICAL DATA:  Multiple falls EXAM: PORTABLE PELVIS 1-2 VIEWS COMPARISON:  Sacrum done on 05/02/2022 FINDINGS: No displaced fracture or dislocation is seen. Linear smooth marginated calcification adjacent to the greater trochanter of right femur may suggest ligament calcification from previous injury. Degenerative changes are noted in the visualized lower lumbar spine. Small sclerotic density in left ischium may suggest benign bone island. IMPRESSION: No fracture or dislocation is seen.  Lumbar spondylosis. Electronically Signed   By: Ernie Avena M.D.   On: 07/31/2022 10:14   DG Chest Port 1 View  Result Date: 07/31/2022 CLINICAL DATA:  Trauma.  Falls. EXAM: PORTABLE CHEST 1 VIEW COMPARISON:  02/04/2021 FINDINGS: 0954 hours. The lungs are clear without focal pneumonia, edema, pneumothorax or pleural effusion. The cardiopericardial silhouette is within normal limits for size. The visualized bony structures of the thorax are unremarkable. Telemetry leads overlie the chest. IMPRESSION: No active disease. Electronically Signed   By: Kennith Center M.D.    On: 07/31/2022 10:13    EKG: none done, full comfort care    Labs on Admission: I have personally reviewed the available labs and imaging studies at the time of the admission.  Pertinent labs:   wbc: 12.0,  hgb: 9.8,  BUN: 80,  creatinine: 1.66,  lactic acid: 5.0,   Assessment and Plan: Principal Problem:   Atrial fibrillation with RVR (HCC) Active Problems:   Acute blood loss anemia   Fall with forehead hematoma/subcutaneous hematoma superficial to the left greater trochanter    Scalp laceration   Seizure-like activity (HCC)   Essential hypertension   Lactic acidosis   CKD (chronic kidney disease) stage 3, GFR 30-59 ml/min (HCC)   Hypothyroidism   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   Chronic diastolic CHF (congestive heart failure) (HCC)    Assessment and Plan: * Atrial fibrillation with RVR (HCC) 88 year old female presenting to ED after unwitnessed fall with bleeding from hematoma, seizure like activity and found to be in afib with RVR -obs to palliative floor -family has opted for full comfort care at this time -in setting of acute anemia from bleeding, afib with rvr she is hypotensive. Palliative care is working on getting her placed in inpatient hospice care -rate now 90-100 with 500cc IVF bolus only, family does not want any other intervention done  -comfort care order set utilized -vitals per comfort care -expressed my sympathy and answered all questions   Acute blood loss anemia hgb baseline is normal, now down to 9.8 s/p fall with bleeding head laceration and hematoma over left greater trochanter with surrounding edema/hemorrhage.  xarelto held  since 10/8 Family has opted for full comfort care   Fall with forehead hematoma/subcutaneous hematoma superficial to the left greater trochanter  Trauma surgery consulted, but family has opted for full comfort care No acute fractures  Seizure-like activity (HCC) Seizure  precautions Ativan PRN   Scalp laceration S/p repair by EDP, hemodynamically stable Head CT with no acute finding   Lactic acidosis Likely secondary to possible seizure like activity, afib with RVR and trauma No s/sx of infectious source Received 500cc bolus  Family has opted for full comfort care at this time   Essential hypertension Hypotensive in setting of acute blood loss anemia and afib with RVR Hold anti-hypertensive, on full comfort care   CKD (chronic kidney disease) stage 3, GFR 30-59 ml/min (HCC) baseline 1.4-1.6 Stable   Hypothyroidism TSH wnl in 04/2022 Continue synthroid   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 Diastolic indeterminate. Bilateral atriums severely enlarged  Full comfort care only     Advance Care Planning:   Code Status: DNR   Consults: palliative care and trauma surgery   DVT Prophylaxis: comfort care only   Family Communication: daughter and sister in law at bedside.   Severity of Illness: The appropriate patient status for this patient is OBSERVATION. Observation status is judged to be reasonable and necessary in order to provide the required intensity of service to ensure the patient's safety. The patient's presenting symptoms, physical exam findings, and initial radiographic and laboratory data in the context of their medical condition is felt to place them at decreased risk for further clinical deterioration. Furthermore, it is anticipated that the patient will be medically stable for discharge from the hospital within 2 midnights of admission.   Author: Orland Mustard, MD 07/31/2022 1:41 PM  For on call review [email or web address].

## 2022-07-31 NOTE — Assessment & Plan Note (Addendum)
S/p repair by EDP, hemodynamically stable Head CT with no acute finding  Will need sutures removed in 3-71 days in applicable in this setting

## 2022-07-31 NOTE — Assessment & Plan Note (Signed)
Full comfort care only

## 2022-07-31 NOTE — Progress Notes (Signed)
Orthopedic Tech Progress Note Patient Details:  Tangie Stay 26-Aug-1932 [id number]        Level 1 trauma Patient ID: [name], female   DOB: 1932-07-13, 88 y.o.   MRN: [id number]  Carin Primrose 07/31/2022, 10:27 AM

## 2022-07-31 NOTE — Assessment & Plan Note (Signed)
baseline 1.4-1.6 Stable

## 2022-07-31 NOTE — Consult Note (Signed)
Consultation Note Date: 07/31/2022   Patient Name: Belinda Anderson  DOB: 1932-01-12  MRN: [id number]  Age / Sex: 88 y.o., female  PCP: [name], DO Referring Physician: Margarita Grizzle, MD  Reason for Consultation: Terminal Care  HPI/Patient Profile: 88 y.o. female  with past medical history of atrial fibrillation on xarelto, dementia, hypothyroidism, diastolic CHF, hx of CVA, multiple falls admitted on 07/31/2022 with another fall with seizure-like activity.   Patient has had recurrent falls at memory care.  After discussion with ED provider, patient's family has decided on comfort focused care.  PMT has been consulted to assist with management.  Clinical Assessment and Goals of Care:  I have reviewed medical records including EPIC notes, labs and imaging, received update from Dr. Rosalia Hammers, assessed the patient and then met at the bedside with patient's daughter [name] and was then joined by patient's daughter-in-law as well to discuss diagnosis prognosis, GOC, EOL wishes, disposition and options.  I introduced Palliative Medicine as specialized medical care for people living with serious illness. It focuses on providing relief from the symptoms and stress of a serious illness. The goal is to improve quality of life for both the patient and the family.  We discussed a brief life review of the patient and then focused on their current illness.  The natural disease trajectory and expectations at EOL were discussed.  I attempted to elicit values and goals of care important to the patient.    Medical History Review and Understanding:  Reviewed patient's acute illness, recurrent falls, dementia and signs of progression to end-stage dementia.  Patient's daughter understands the severity of her illness.  Social History: Patient resides at [facility] care.  She was raised [place] a farm and is 1 of 7 children.   She has 3 children of her own.  [name] states that her 2 siblings are in denial about patient's illness.  Patient previously enjoyed gardening and she is described as Theme park manager."  She is a International aid/development worker.  Functional and Nutritional State: Patient has very little to drink or eat and has lost 25 pounds in the past few months.  She has had worsening weakness over this time as well.  She is wheelchair-bound.  Palliative Symptoms: Pain, anxiety  Advance Directives: A detailed discussion regarding advanced directives was had.  Patient's daughter Byrd Hesselbach reports she is HCPOA.  No documents available at this time.  Code Status: Concepts specific to code status, artifical feeding and hydration, and rehospitalization were considered and discussed.   Discussion: Patient's daughter is at peace with her decision for full comfort care, although it has been a long road to finding this peace.  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  She has had several conversations over the years with her mother about her care preferences as well.  [name] feels confident that patient would have been "mortified" if she realized what she was going through. [name] also states patient would have been very very angry with her for letting things "go on as long as they have."  In hindsight, [name] feels like she should have transitioned patient to comfort care at the time of her stroke 3 years ago.  We discussed that patient would no longer receive aggressive medical interventions such as continuous vital signs, lab work, radiology testing, or medications not focused on comfort. All care would focus on how the patient is looking and feeling. This would include management of any  symptoms that may cause discomfort, pain, shortness of breath, cough, nausea, agitation, anxiety, and/or secretions etc. Symptoms would be managed with medications and other non-pharmacological interventions such as spiritual  support if requested, repositioning, music therapy, or therapeutic listening. Family verbalized understanding and appreciation.  Residential hospice was also explained and Marie's agreeable to proceeding with referral today.  She notes that it is important to realize patient will often deny any pain or distress, as she was raised this way, but family has recognized that she actually has pain due to rocking and moaning.   Questions and concerns were addressed.  Hard Choices booklet left for review.  Gone from my sight booklet provided for review.  The family was encouraged to call with questions or concerns.  PMT will continue to support holistically.   SUMMARY OF RECOMMENDATIONS   -DNR confirmed -Comfort focused care Morphine Q3H for pain/air hunger/comfort, PRN Q1H for breakthrough symptoms Ativan Q3H, PRN Q2H for agitation/anxiety/air hunger Robinul PRN for excessive secretions Zofran PRN for nausea Liquifilm tears PRN for dry eyes May have comfort feeding Comfort cart for family Unrestricted visitations in the setting of EOL (per policy) -TOC consulted for assistance with referral to residential hospice facility -Psychosocial and emotional support provided -PMT will continue to follow and support  Prognosis:  < 2 weeks  Discharge Planning: Hospice facility      Primary Diagnoses: Present on Admission: **None**  Physical Exam Vitals and nursing note reviewed.  Constitutional:      General: She is not in acute distress.    Appearance: She is ill-appearing.  Cardiovascular:     Rate and Rhythm: Normal rate. Rhythm irregularly irregular.  Pulmonary:     Effort: Pulmonary effort is normal. No respiratory distress.  Skin:    Findings: Abrasion, bruising, laceration and wound present.     Comments: Dried blood visible on face, feet  Neurological:     Mental Status: She is lethargic.    Vital Signs: BP 90/61   Pulse 99   Temp (!) 97 F (36.1 C) (Temporal)   Resp 15   Ht  5\' 5"  (1.651 m)   Wt 61.7 kg   SpO2 100%   BMI 22.63 kg/m  Pain Scale: Faces     SpO2: SpO2: 100 % O2 Device:SpO2: 100 % O2 Flow Rate: .   Palliative Assessment/Data: 10 to 20%     MDM: High   Blaire Hodsdon Jeni Salles, PA-C  Palliative Medicine Team Team phone # [phone]  Thank you for allowing the Palliative Medicine Team to assist in the care of this patient. Please utilize secure chat with additional questions, if there is no response within 30 minutes please call the above phone number.  Palliative Medicine Team providers are available by phone from 7am to 7pm daily and can be reached through the team cell phone.  Should this patient require assistance outside of these hours, please call the patient's attending physician.

## 2022-07-31 NOTE — TOC Initial Note (Signed)
Transition of Care [facility]) - Initial/Assessment Note    Patient Details  Name: Belinda Anderson MRN: [id number] Date of Birth: 1931/11/11  [facility]) CM/SW Contact:    Verdell Carmine, RN Phone Number: 07/31/2022, 12:35 PM  Clinical Narrative:                  Spoke with patient and family in room. They would like residential hospice, [place] [facility]. Messaged on call Venia Carbon to see if there were beds available today.  Family wishes to stop any treatments and just keep her comfortable.    Expected Discharge Plan: [place]     Patient Goals and CMS Choice        Expected Discharge Plan and Services Expected Discharge Plan: [place]   Discharge Planning Services: CM Consult Post Acute Care Choice: Residential Hospice Bed Living arrangements for the past 2 months: [place]                                      Prior Living Arrangements/Services Living arrangements for the past 2 months: [place] Lives with:: Facility Resident                   Activities of Daily Living      Permission Sought/Granted                  Emotional Assessment       Orientation: : Fluctuating Orientation (Suspected and/or reported Sundowners) Alcohol / Substance Use: Never Used Psych Involvement: No (comment)  Admission diagnosis:  Atrial fibrillation with RVR ([place]) [I48.91] Patient Active Problem List   Diagnosis Date Noted   Atrial fibrillation with RVR ([place]) 07/31/2022   Fall 07/31/2022   Scalp laceration 07/31/2022   Hypothyroidism 07/31/2022   Essential hypertension 07/31/2022   PCP:  [name], DO Pharmacy:  No Pharmacies Listed    Social Determinants of Health (SDOH) Interventions    Readmission Risk Interventions     No data to display

## 2022-07-31 NOTE — Assessment & Plan Note (Signed)
Trauma surgery consulted, but family has opted for full comfort care No acute fractures

## 2022-07-31 NOTE — ED Notes (Signed)
Harris PA notified of LA level.

## 2022-07-31 NOTE — Assessment & Plan Note (Addendum)
88 year old female presenting to ED after unwitnessed fall with bleeding from hematoma, seizure like activity and found to be in afib with RVR -obs to palliative floor -family has opted for full comfort care at this time -in setting of acute anemia from bleeding, afib with rvr she is hypotensive. Palliative care is working on getting her placed in inpatient hospice care -rate now 90-100 with 500cc IVF bolus only, family does not want any other intervention done  -comfort care order set utilized -vitals per comfort care -being discharged to [facility] place inpatient hospice care

## 2022-07-31 NOTE — Assessment & Plan Note (Signed)
TSH wnl in 04/2022 Continue synthroid

## 2022-07-31 NOTE — Assessment & Plan Note (Addendum)
hgb baseline is normal, now down to 9.8 s/p fall with bleeding head laceration and hematoma over left greater trochanter with surrounding edema/hemorrhage.  xarelto held since 10/8 Family has opted for full comfort care

## 2022-07-31 NOTE — ED Notes (Signed)
Trauma Response Nurse Documentation  Belinda Anderson is a 88 y.o. female arriving to [facility] ED via EMS  On Xarelto (rivaroxaban) daily. Trauma was activated as a Level 1 based on the following trauma criteria Anytime Systolic Blood Pressure < 90/active hemorrhage/EDP discretion. Trauma team at the bedside on patient arrival.   Patient cleared for CT by Dr. Jeanell Sparrow. Pt transported to CT with trauma response nurse present to monitor due to active hemorrhage and borderline blood pressure. RN remained with the patient throughout their absence from the department for clinical observation.   Patient presented after multiple falls where she was reportedly discharged from [place] last night. Patient found in floor this morning by staff at [facility], large pool of blood. Multiple areas of old and new bruising, severe bruising to face/neck, large hematoma to left hip area. Active arterial hemorrhage to left forehead with hematoma. Sutures placed by ED PA, bleeding controlled.   History   Past Medical History:  Diagnosis Date   A-fib ([place])    Hypertension    Thyroid disease         Initial Focused Assessment (If applicable, or please see trauma documentation): GCS 14, PERR 3 - baseline dementia, from [facility] memory care Airway intact, Breath sounds equal Active hemorrhage controlled by sutures placed by ED PA  CT's Completed:   CT Head, CT C-Spine, CT Chest w/ contrast, and CT abdomen/pelvis w/ contrast   Interventions:  IV, trauma labs CXR/PXR Sutures  Fentanyl 12.36mcg Zofran CT Head/Cspine/C/A/P  Plan for disposition:  Discharge home - hospice  Event Summary: Patient presented after multiple falls and ED visits. On Xarelto for Afib, severe bruising identified in different states of healing. On daughters arrival she voiced preference for palliative/hospice. Palliative consult placed, plans for residential hospice.  Bedside handoff with ED RN Olympia Fields RN  Please call TRN at [phone] for further assistance.

## 2022-07-31 NOTE — Assessment & Plan Note (Signed)
Hypotensive in setting of acute blood loss anemia and afib with RVR Hold anti-hypertensive, on full comfort care

## 2022-07-31 NOTE — Assessment & Plan Note (Signed)
Euvolemic Echo 01/2021: normal EF with normal LVF. Diastolic indeterminate. Bilateral atriums severely enlarged  Full comfort care only

## 2022-07-31 NOTE — ED Notes (Signed)
Both of patients IV were left in place on the request of Lauren at [facility].

## 2022-07-31 NOTE — Progress Notes (Signed)
Manufacturing engineer [facility])  Referral received for [employer] [facility].  Patient is approved for [facility] for symptom management associated with EOL.  RN staff, please call report to [phone], room is assigned when report is called. Please leave IV and foley in place (if applicable).  [facility] will update when transport can be arranged.  If Belinda Anderson is not at the bedside when EMS arrives, please call her (number correct in Epic).  Thank you, Venia Carbon DNP, RN [facility] Liaison

## 2022-07-31 NOTE — ED Notes (Incomplete)
Trauma Response Nurse Documentation  Belinda Anderson is a 88 y.o. female arriving to [facility] ED via EMS  On Xarelto (rivaroxaban) daily. Trauma was activated as a Level 1 based on the following trauma criteria Anytime Systolic Blood Pressure < 90/EDP discretion due to excess blood loss. Trauma team at the bedside on patient arrival.   Patient cleared for CT by Dr. Jeanell Sparrow. Pt transported to CT with trauma response nurse present to monitor due to initial hypotension, tachycardia and active hemorrhage. RN remained with the patient throughout their absence from the department for clinical observation.   Patient from [facility] unit, discharged from [place] yesterday for same. Patient found on floor this morning with a large pool of blood, active bleeding from the head. Sutures placed by PA, bleeding controlled.   History   Past Medical History:  Diagnosis Date  . A-fib ([place])   . Hypertension   . Thyroid disease      *** The histories are not reviewed yet. Please review them in the "History" navigator section and refresh this [place].     Initial Focused Assessment (If applicable, or please see trauma documentation):   CT's Completed:   {Trauma CT:26866}   Interventions:   Plan for disposition:  {Trauma Dispo:26867}   Consults completed:  {Trauma Consults:26862} at ***.  Event Summary:   Bedside handoff with {Trauma handoff:26863::"ED RN"} ***.    Belinda Anderson  Trauma Response RN  Please call TRN at [phone] for further assistance.

## 2022-07-31 NOTE — Assessment & Plan Note (Addendum)
Seizure precautions Ativan PRN Discharging to [facility] place for inpatient hospice

## 2022-07-31 NOTE — ED Triage Notes (Signed)
Pt bib ems from [place], multiple falls. Unwitnessed fall today, hit head, found on ground with 2X2 foot puddle of blood. Unknown downtime. Pt with witnessed Tonic clonic seizure lasting approx 45sec with no hx. Pt bled through multiple bandages. EMS with pressure bandage in place.  Dementia, unknown baseline Repetitive questioning.  92/60 HR 130 afib on xarelto

## 2022-08-01 ENCOUNTER — Encounter (HOSPITAL_BASED_OUTPATIENT_CLINIC_OR_DEPARTMENT_OTHER): Payer: Self-pay | Admitting: Emergency Medicine

## 2022-08-02 ENCOUNTER — Ambulatory Visit: Payer: Medicare Other | Admitting: Family Medicine

## 2022-08-17 DEATH — deceased

## 2022-10-26 ENCOUNTER — Ambulatory Visit: Payer: Medicare Other | Admitting: Cardiology

## 2023-10-13 IMAGING — CT CT HEAD W/O CM
3 series · 15 of 47 positions shown, 18 images · non-contrast
Comparison: None.

CLINICAL DATA: Fall today. Headache and neck pain. Initial
encounter.

EXAM:
CT HEAD WITHOUT CONTRAST
CT CERVICAL SPINE WITHOUT CONTRAST
TECHNIQUE: Multidetector CT imaging of the head and cervical spine was
performed following the standard protocol without intravenous
contrast. Multiplanar CT image reconstructions of the cervical spine
were also generated.

[Series 3: head 5.0 h30s · axial · 0.41mm/px · z∈[-513,-388]mm · 9 of 31 slices shown, 12 images]
[im 3/31  brain]
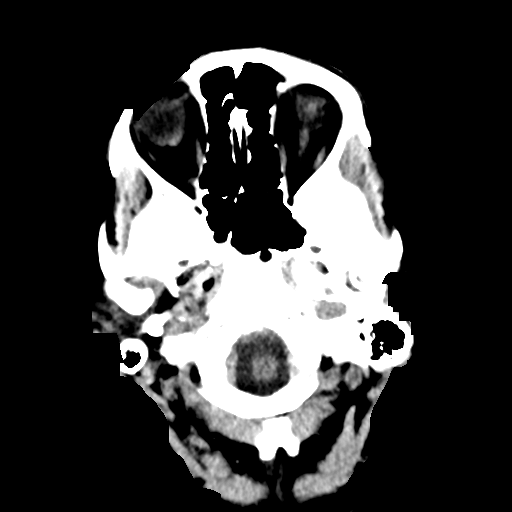
[im 3/31  bone]
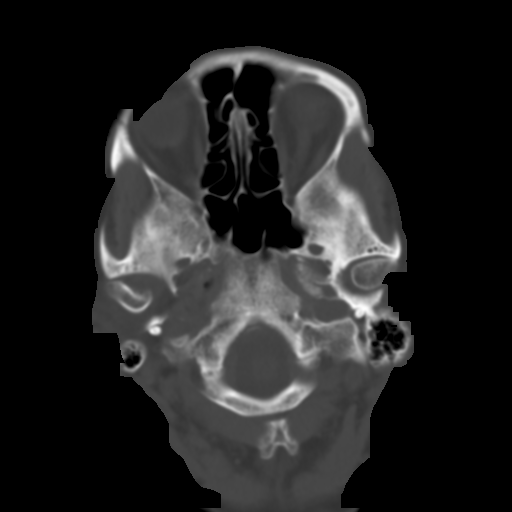
[im 6/31  brain]
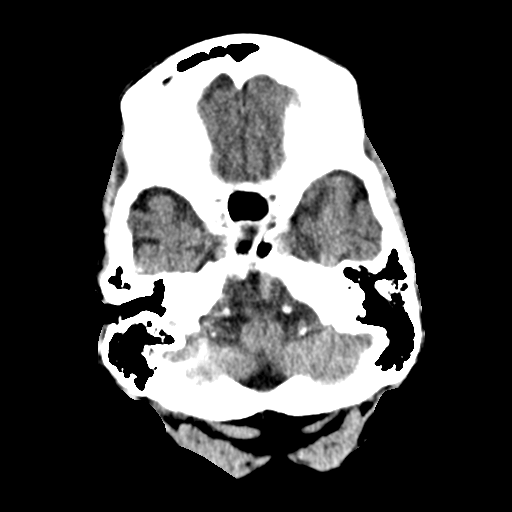
[im 9/31  brain]
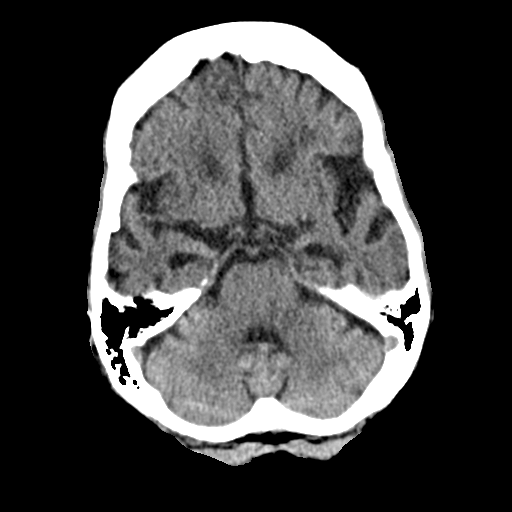
[im 12/31  brain]
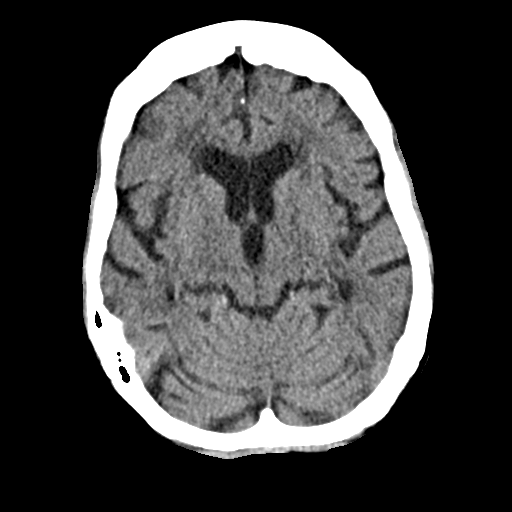
[im 16/31  brain]
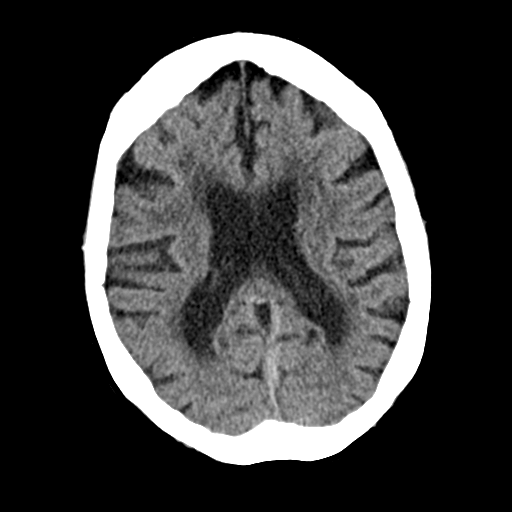
[im 16/31  bone]
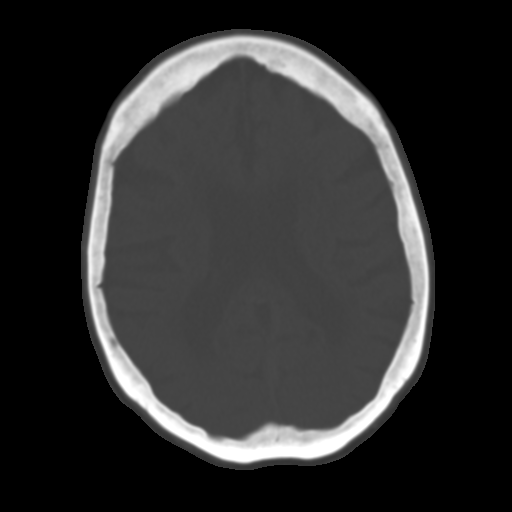
[im 19/31  brain]
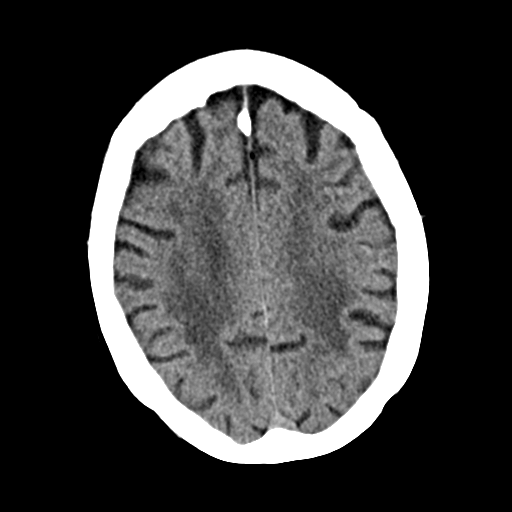
[im 22/31  brain]
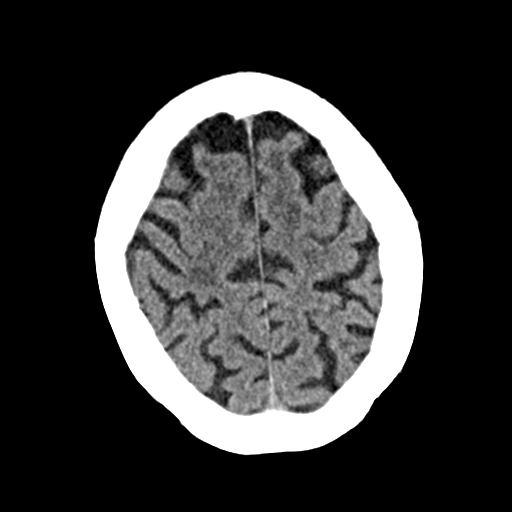
[im 25/31  brain]
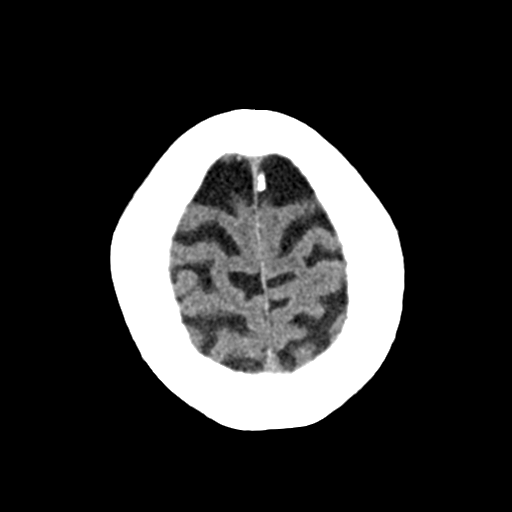
[im 28/31  brain]
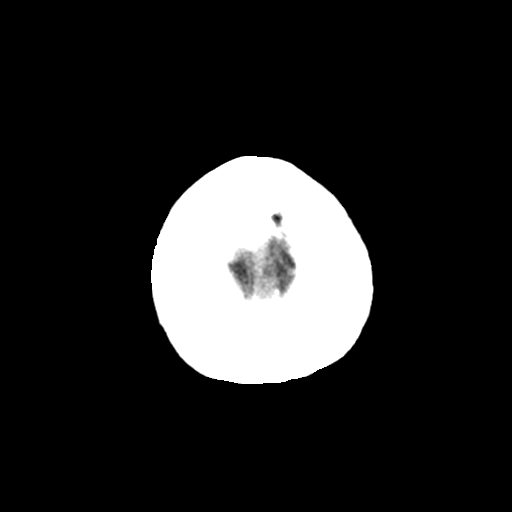
[im 28/31  bone]
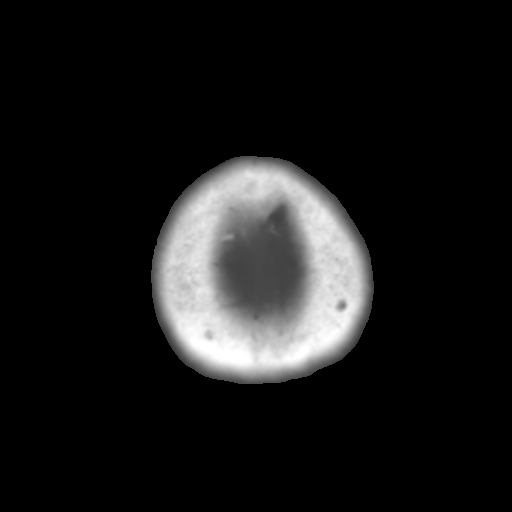

[Series 5: head 3.0 mpr cor · coronal · 0.31mm/px · 3 of 68 slices shown]
[im 23/68  brain]
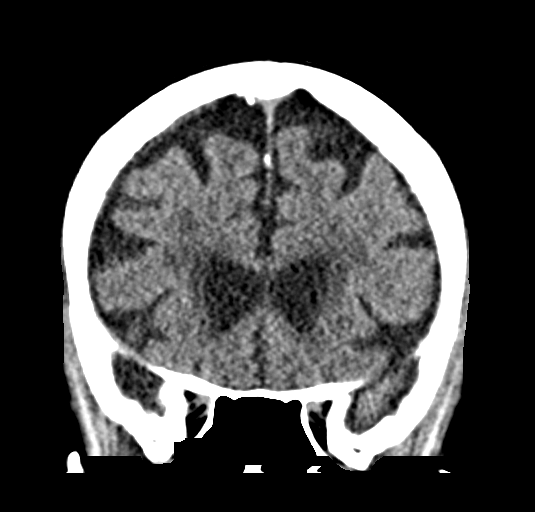
[im 30/68  brain]
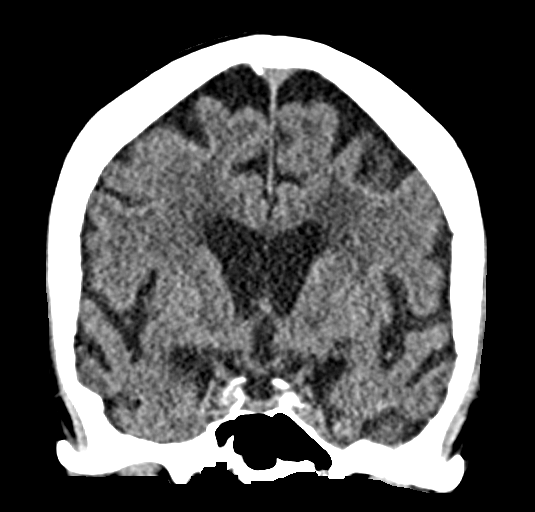
[im 38/68  brain]
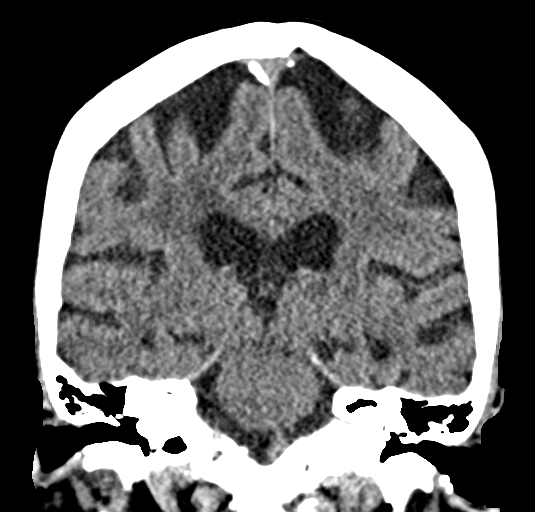

[Series 6: head 3.0 mpr sag · sagittal · 0.31mm/px · 3 of 53 slices shown]
[im 18/53  brain]
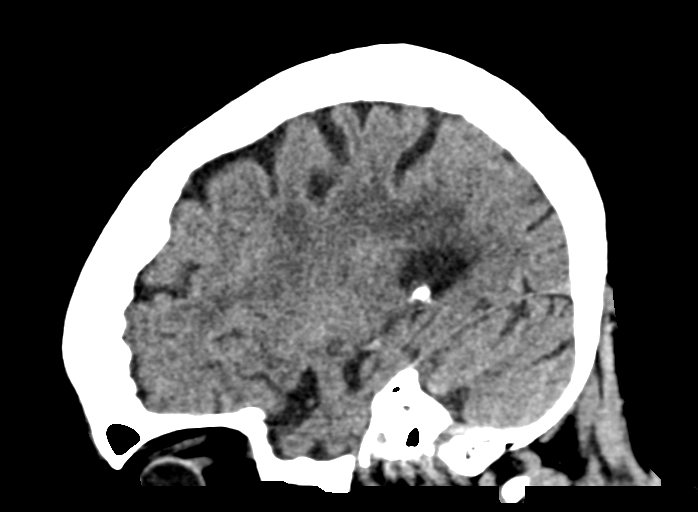
[im 27/53  brain]
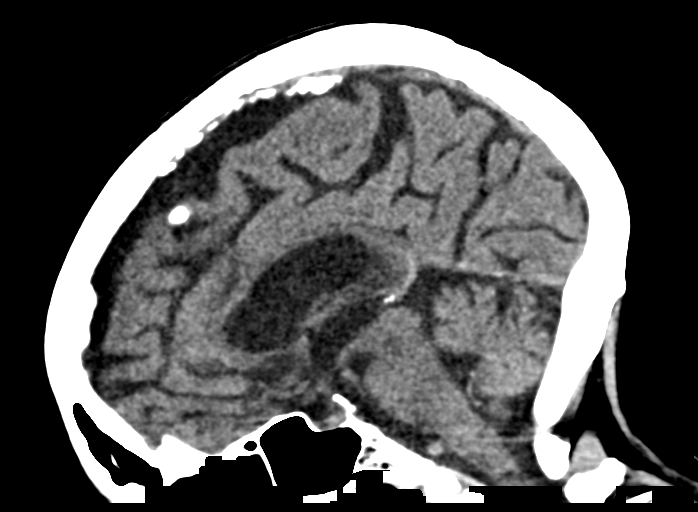
[im 35/53  brain]
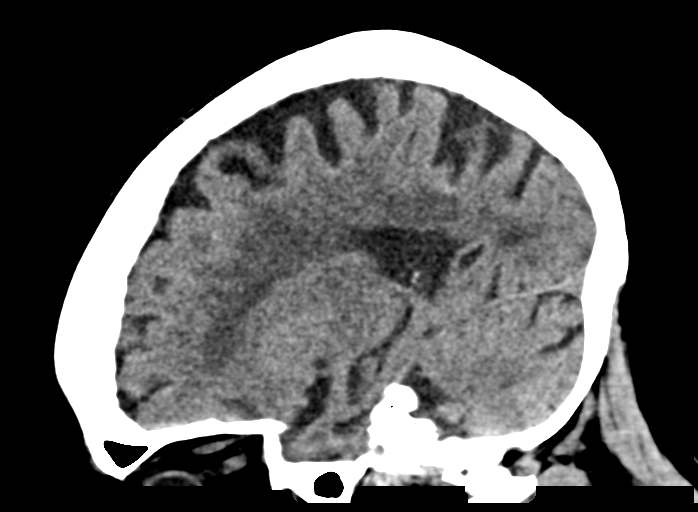

[15 of 47 positions shown; findings below may reference images not displayed]

FINDINGS: CT HEAD FINDINGS

Brain: No evidence of acute infarction, hemorrhage, hydrocephalus,
extra-axial collection, or mass lesion/mass effect. Moderate diffuse
cerebral atrophy and chronic small vessel disease are noted. Old
left basal ganglia and thalamic lacunar infarcts are seen.

Vascular:  No hyperdense vessel or other acute findings.

Skull: No evidence of fracture or other significant bone
abnormality.

Sinuses/Orbits:  No acute findings.

Other: None.

CT CERVICAL SPINE FINDINGS

Alignment: Grade 1 anterolisthesis is seen at C7-T1, attributable to
degenerative changes seen at this level.

Skull base and vertebrae: No acute fracture. No primary bone lesion
or focal pathologic process.

Soft tissues and spinal canal: No prevertebral fluid or swelling. No
visible canal hematoma.

Disc levels: Multilevel degenerative disc disease is seen which is
most severe from levels of C3 through C6. Bilateral facet DJD is
also seen, left side greater than right.

Upper chest: No acute findings.

Other: None.
IMPRESSION: No acute intracranial abnormality. Moderate cerebral atrophy,
chronic small vessel disease, and old left basal ganglia and
thalamic lacunar infarcts.

No evidence of acute cervical spine fracture. Degenerative
spondylosis, as described above.

## 2023-10-13 IMAGING — DX DG HAND 2V*R*
2 series · 2 of 2 positions shown · non-contrast
Comparison: None.

CLINICAL DATA: Fall today

EXAM:
RIGHT HAND - 2 VIEW

[hand ap]
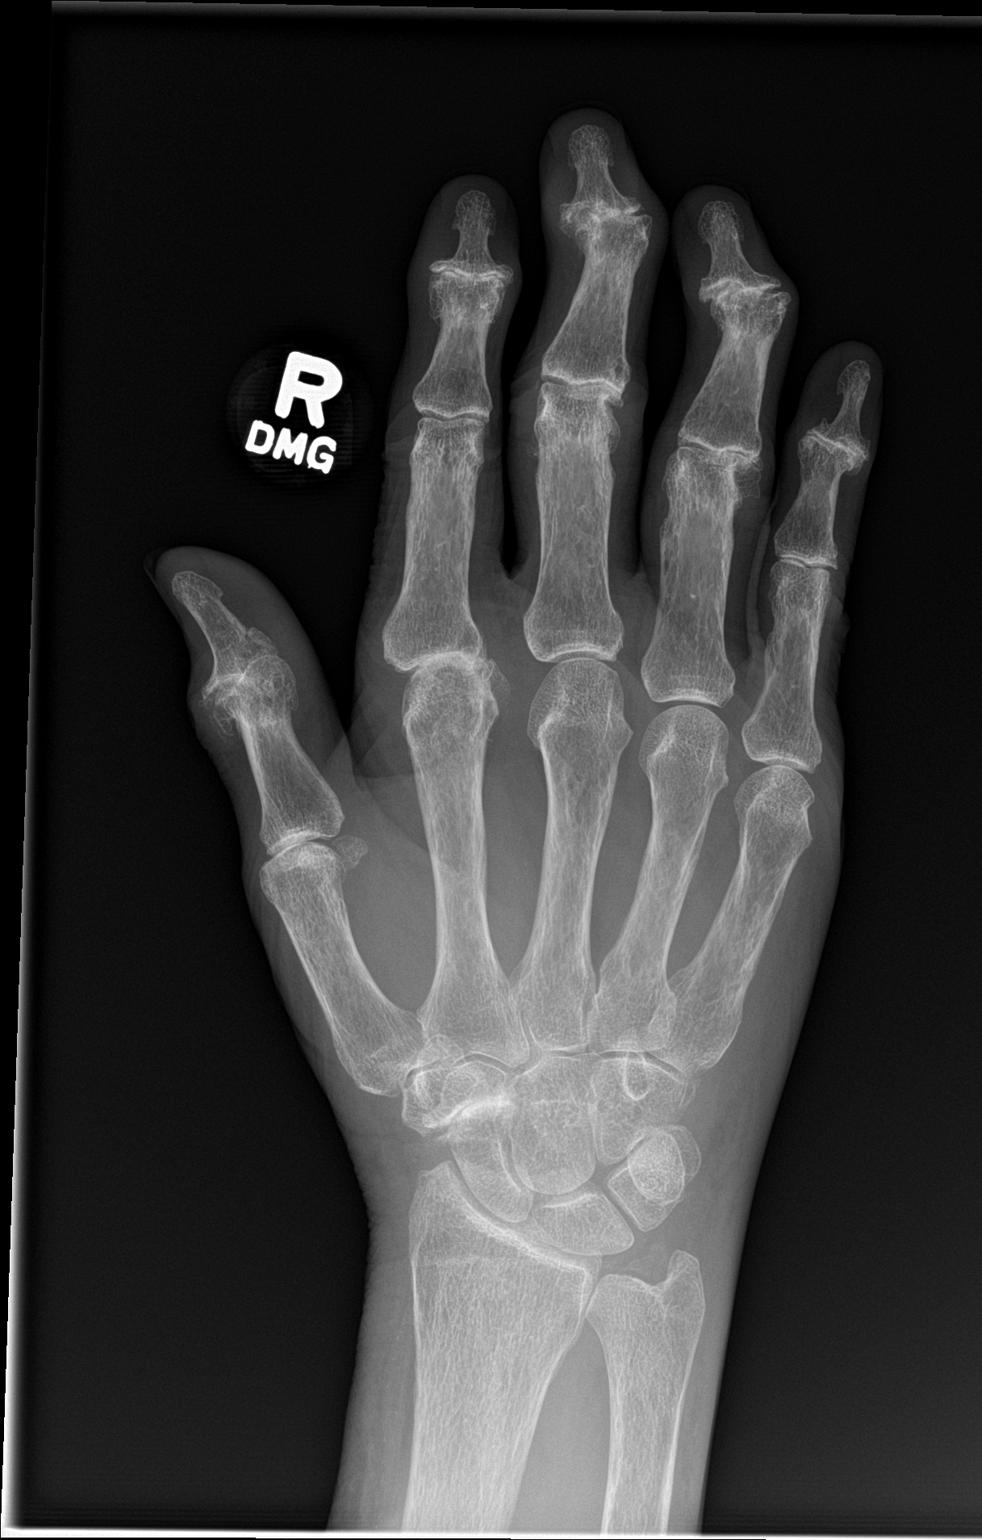

[hand lat]
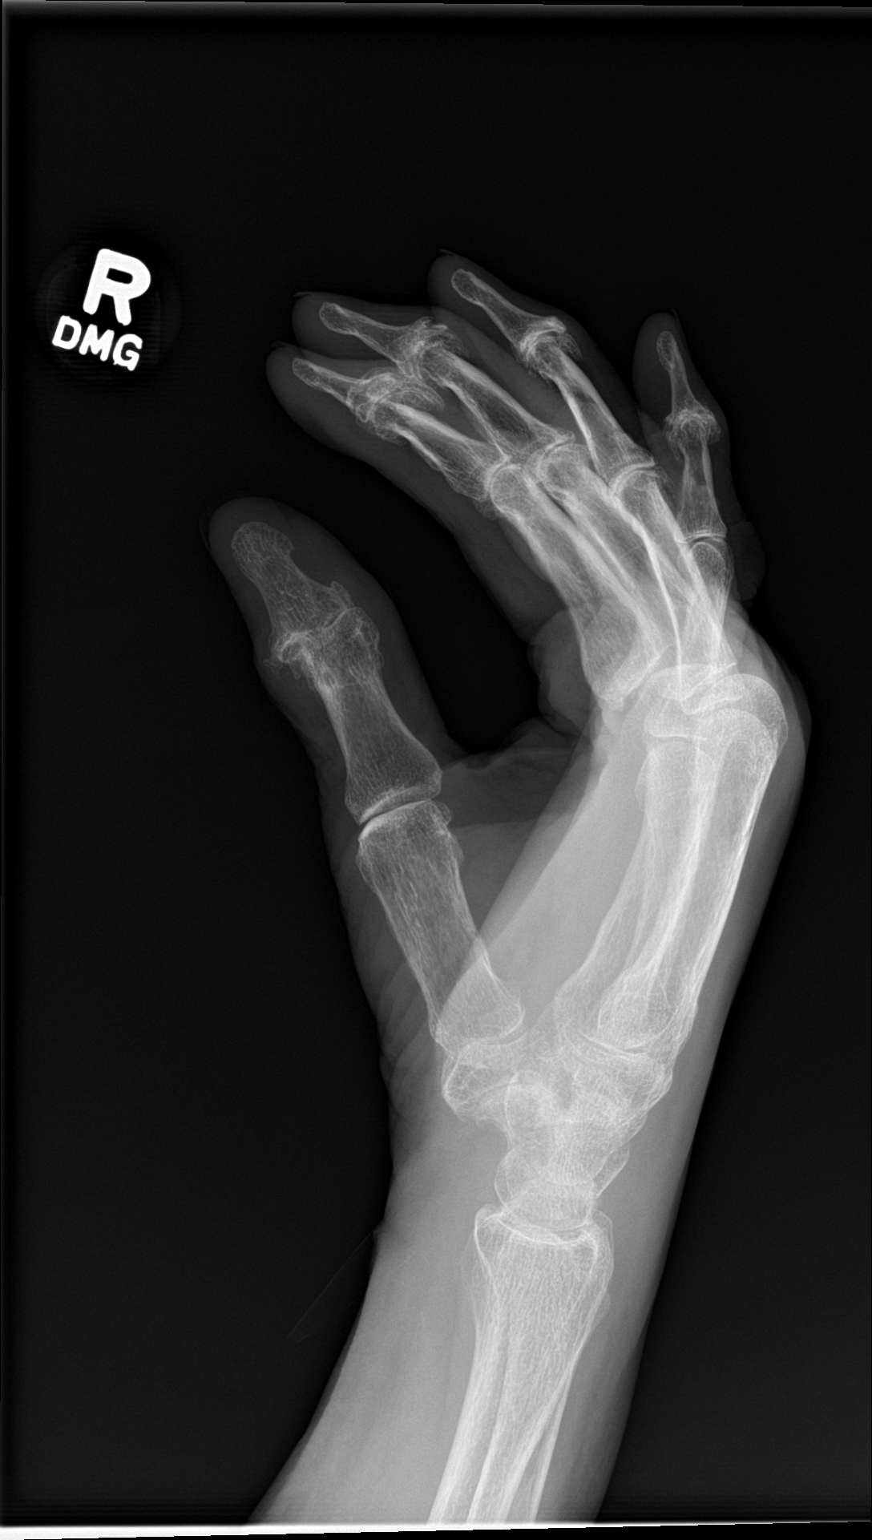

[2 of 2 positions shown; findings below may reference images not displayed]

FINDINGS: Negative for fracture or dislocation

Advanced diffuse osteoarthritis.
IMPRESSION: Negative for fracture.

## 2023-10-13 IMAGING — CT CT CERVICAL SPINE W/O CM
3 of 4 series · 12 of 33 positions shown, 14 images · non-contrast
Comparison: None.

CLINICAL DATA: Fall today. Headache and neck pain. Initial
encounter.

EXAM:
CT HEAD WITHOUT CONTRAST
CT CERVICAL SPINE WITHOUT CONTRAST
TECHNIQUE: Multidetector CT imaging of the head and cervical spine was
performed following the standard protocol without intravenous
contrast. Multiplanar CT image reconstructions of the cervical spine
were also generated.

[Series 4: c_spine 2.0 i30s 3 · axial · 0.40mm/px · z∈[-576,-512]mm · 4 of 50 slices shown, 5 images]
[im 9/50  soft-tissue]
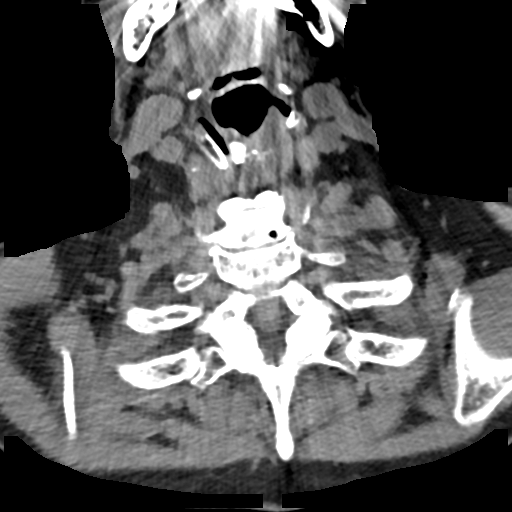
[im 9/50  bone]
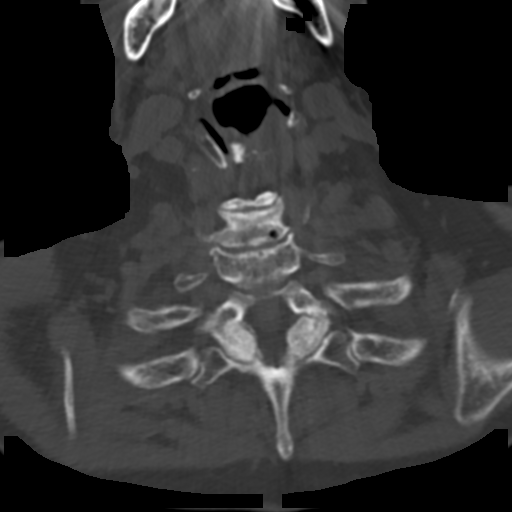
[im 17/50  bone]
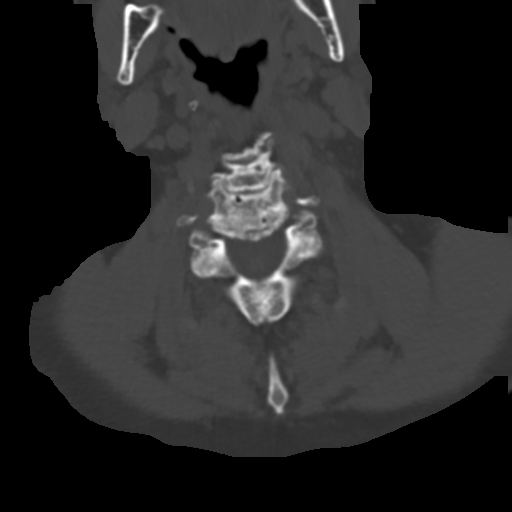
[im 33/50  bone]
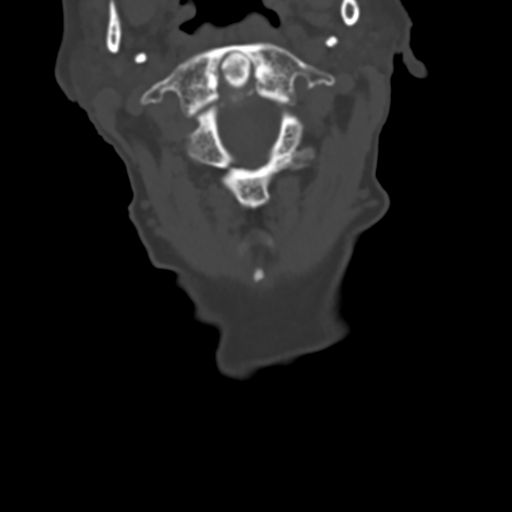
[im 41/50  bone]
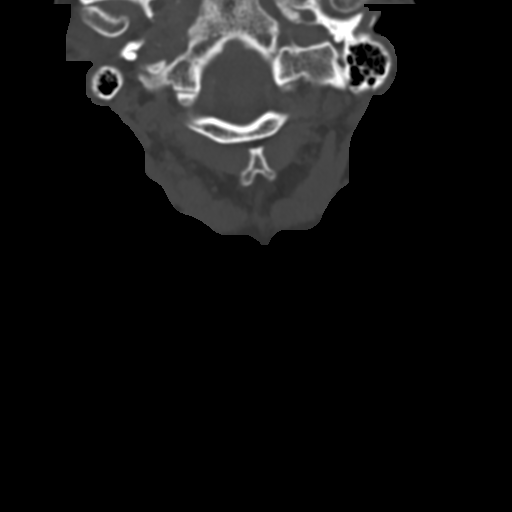

[Series 7: sagittals · sagittal · 0.23mm/px · 5 of 61 slices shown, 6 images]
[im 21/61  bone]
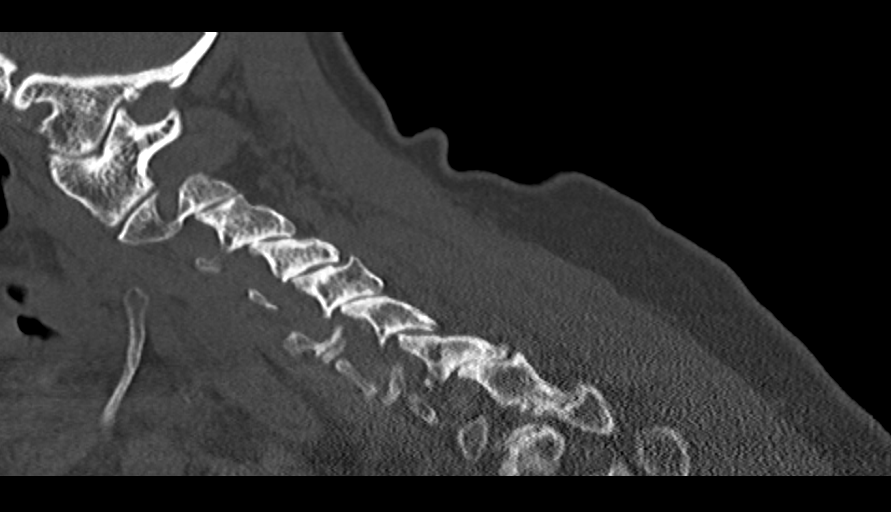
[im 26/61  bone]
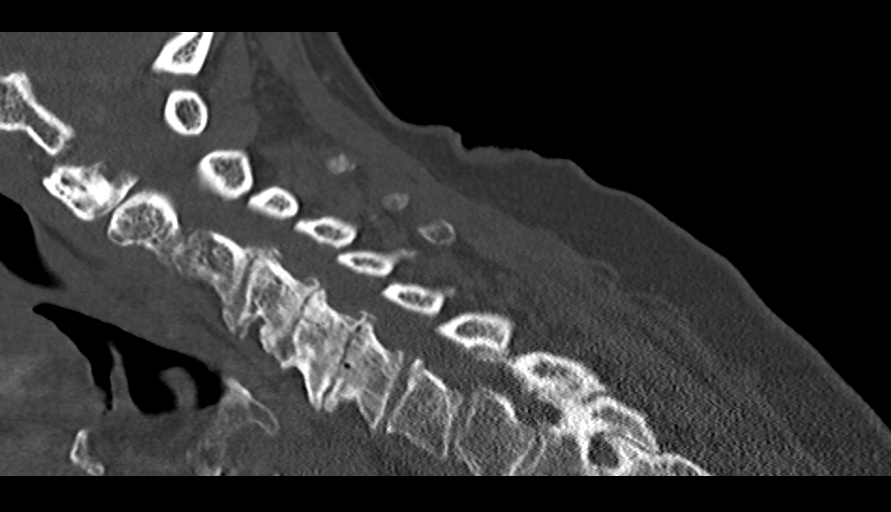
[im 31/61  soft-tissue]
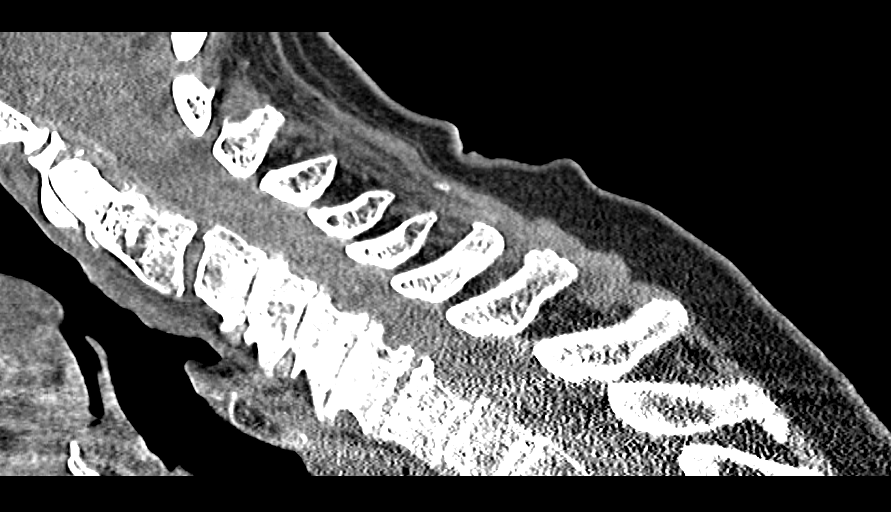
[im 31/61  bone]
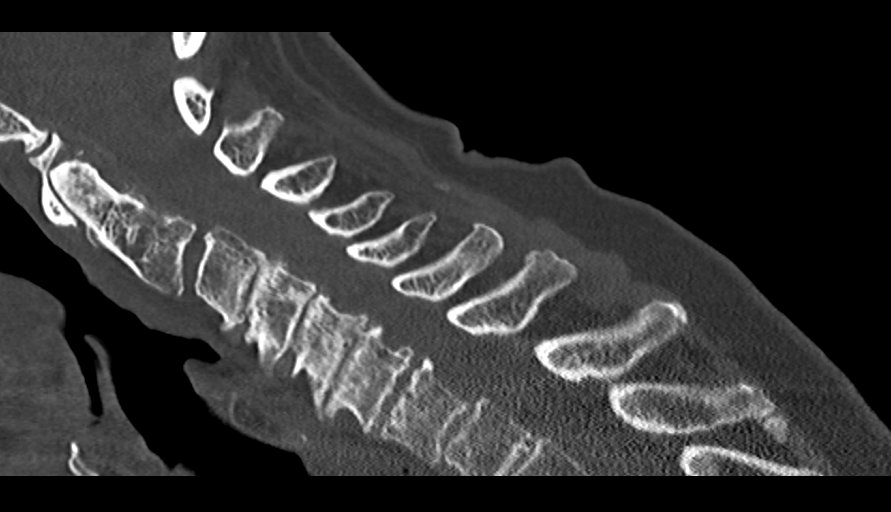
[im 36/61  bone]
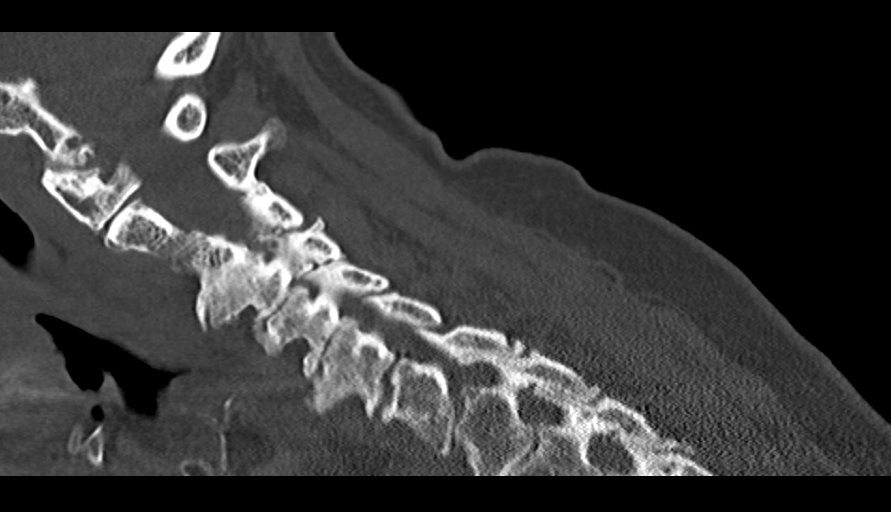
[im 41/61  bone]
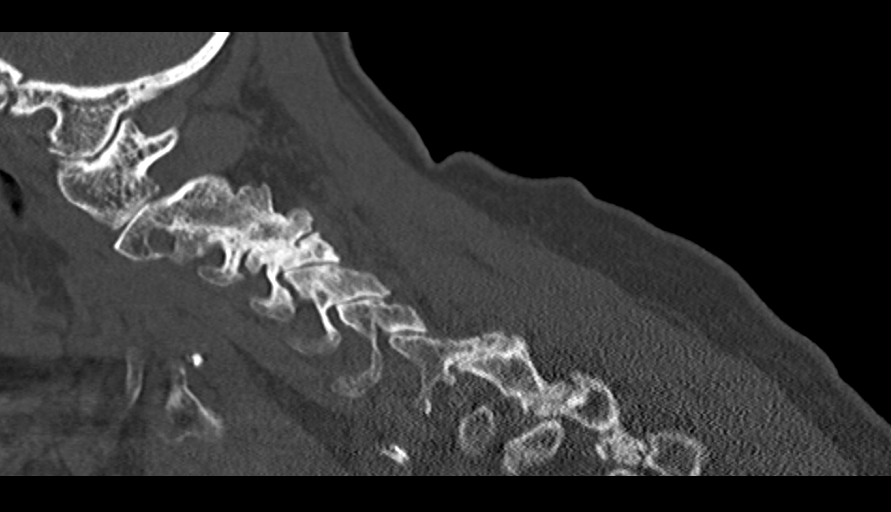

[Series 8: orthogonals · coronal · 0.19mm/px · 3 of 80 slices shown]
[im 26/80  bone]
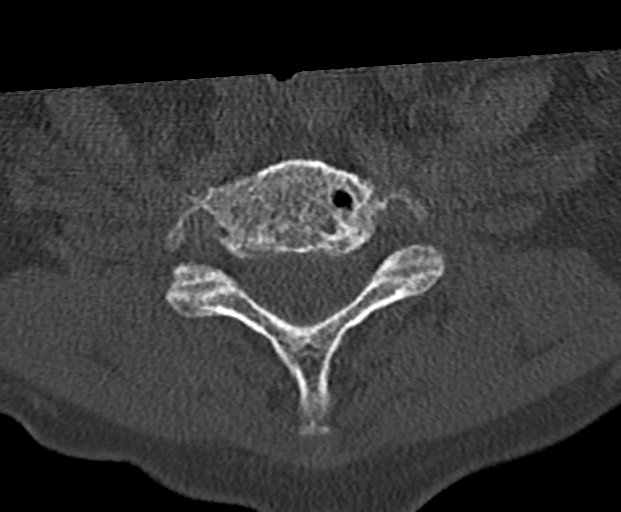
[im 35/80  bone]
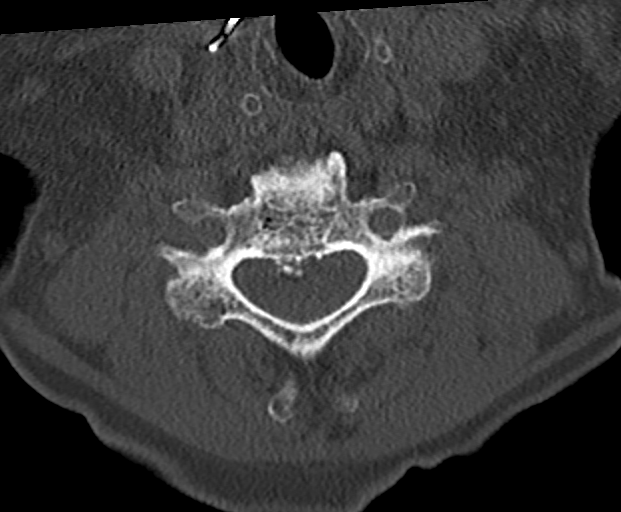
[im 45/80  bone]
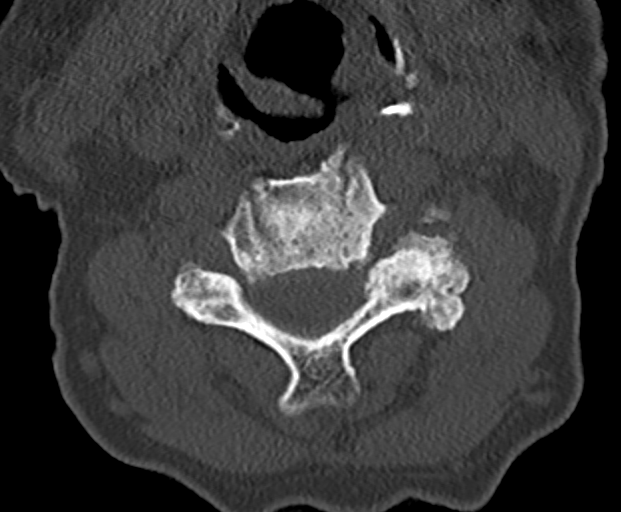

[12 of 33 positions shown; findings below may reference images not displayed]

FINDINGS: CT HEAD FINDINGS

Brain: No evidence of acute infarction, hemorrhage, hydrocephalus,
extra-axial collection, or mass lesion/mass effect. Moderate diffuse
cerebral atrophy and chronic small vessel disease are noted. Old
left basal ganglia and thalamic lacunar infarcts are seen.

Vascular:  No hyperdense vessel or other acute findings.

Skull: No evidence of fracture or other significant bone
abnormality.

Sinuses/Orbits:  No acute findings.

Other: None.

CT CERVICAL SPINE FINDINGS

Alignment: Grade 1 anterolisthesis is seen at C7-T1, attributable to
degenerative changes seen at this level.

Skull base and vertebrae: No acute fracture. No primary bone lesion
or focal pathologic process.

Soft tissues and spinal canal: No prevertebral fluid or swelling. No
visible canal hematoma.

Disc levels: Multilevel degenerative disc disease is seen which is
most severe from levels of C3 through C6. Bilateral facet DJD is
also seen, left side greater than right.

Upper chest: No acute findings.

Other: None.
IMPRESSION: No acute intracranial abnormality. Moderate cerebral atrophy,
chronic small vessel disease, and old left basal ganglia and
thalamic lacunar infarcts.

No evidence of acute cervical spine fracture. Degenerative
spondylosis, as described above.
